# Patient Record
Sex: Male | Born: 1970
Health system: Southern US, Community
[De-identification: ages and names within clinical notes are randomized; demographics above are authoritative.]

## PROBLEM LIST (undated history)

## (undated) DIAGNOSIS — F1910 Other psychoactive substance abuse, uncomplicated: Secondary | ICD-10-CM

## (undated) DIAGNOSIS — J9600 Acute respiratory failure, unspecified whether with hypoxia or hypercapnia: Secondary | ICD-10-CM

## (undated) DIAGNOSIS — I10 Essential (primary) hypertension: Secondary | ICD-10-CM

## (undated) DIAGNOSIS — J9601 Acute respiratory failure with hypoxia: Secondary | ICD-10-CM

## (undated) DIAGNOSIS — Z87898 Personal history of other specified conditions: Secondary | ICD-10-CM

## (undated) DIAGNOSIS — K7460 Unspecified cirrhosis of liver: Secondary | ICD-10-CM

## (undated) DIAGNOSIS — S022XXA Fracture of nasal bones, initial encounter for closed fracture: Secondary | ICD-10-CM

## (undated) DIAGNOSIS — I959 Hypotension, unspecified: Secondary | ICD-10-CM

## (undated) DIAGNOSIS — S0591XA Unspecified injury of right eye and orbit, initial encounter: Secondary | ICD-10-CM

## (undated) DIAGNOSIS — M542 Cervicalgia: Secondary | ICD-10-CM

## (undated) DIAGNOSIS — I2699 Other pulmonary embolism without acute cor pulmonale: Secondary | ICD-10-CM

## (undated) DIAGNOSIS — I2692 Saddle embolus of pulmonary artery without acute cor pulmonale: Secondary | ICD-10-CM

## (undated) DIAGNOSIS — R042 Hemoptysis: Secondary | ICD-10-CM

## (undated) DIAGNOSIS — G8929 Other chronic pain: Secondary | ICD-10-CM

## (undated) DIAGNOSIS — Z87828 Personal history of other (healed) physical injury and trauma: Secondary | ICD-10-CM

## (undated) DIAGNOSIS — Z8679 Personal history of other diseases of the circulatory system: Secondary | ICD-10-CM

## (undated) DIAGNOSIS — H269 Unspecified cataract: Secondary | ICD-10-CM

## (undated) DIAGNOSIS — M5030 Other cervical disc degeneration, unspecified cervical region: Secondary | ICD-10-CM

## (undated) DIAGNOSIS — I2602 Saddle embolus of pulmonary artery with acute cor pulmonale: Secondary | ICD-10-CM

## (undated) DIAGNOSIS — R55 Syncope and collapse: Secondary | ICD-10-CM

## (undated) DIAGNOSIS — N179 Acute kidney failure, unspecified: Secondary | ICD-10-CM

## (undated) DIAGNOSIS — R000 Tachycardia, unspecified: Secondary | ICD-10-CM

## (undated) HISTORY — DX: Saddle embolus of pulmonary artery with acute cor pulmonale: I26.02

## (undated) HISTORY — DX: Syncope and collapse: R55

## (undated) HISTORY — PX: OTHER SURGICAL HISTORY: SHX169

## (undated) HISTORY — PX: TONSILLECTOMY: SUR1361

## (undated) HISTORY — DX: Unspecified cataract: H26.9

## (undated) HISTORY — DX: Saddle embolus of pulmonary artery without acute cor pulmonale: I26.92

## (undated) HISTORY — DX: Other cervical disc degeneration, unspecified cervical region: M50.30

## (undated) HISTORY — DX: Tachycardia, unspecified: R00.0

## (undated) HISTORY — DX: Personal history of other specified conditions: Z87.898

## (undated) HISTORY — PX: UPPER GASTROINTESTINAL ENDOSCOPY: SHX188

## (undated) HISTORY — DX: Other pulmonary embolism without acute cor pulmonale: I26.99

## (undated) HISTORY — DX: Personal history of other (healed) physical injury and trauma: Z87.828

## (undated) HISTORY — DX: Acute respiratory failure with hypoxia: J96.01

## (undated) HISTORY — DX: Hypotension, unspecified: I95.9

## (undated) HISTORY — DX: Other psychoactive substance abuse, uncomplicated: F19.10

## (undated) HISTORY — DX: Acute kidney failure, unspecified: N17.9

## (undated) HISTORY — DX: Fracture of nasal bones, initial encounter for closed fracture: S02.2XXA

---

## 2006-10-31 DIAGNOSIS — Z9889 Other specified postprocedural states: Secondary | ICD-10-CM

## 2007-05-13 ENCOUNTER — Ambulatory Visit: Payer: Self-pay | Admitting: Pain Medicine

## 2007-08-28 ENCOUNTER — Emergency Department: Payer: Self-pay | Admitting: Emergency Medicine

## 2007-09-05 ENCOUNTER — Emergency Department (HOSPITAL_COMMUNITY): Admission: EM | Admit: 2007-09-05 | Discharge: 2007-09-05 | Payer: Self-pay | Admitting: Emergency Medicine

## 2007-12-15 ENCOUNTER — Emergency Department: Payer: Self-pay | Admitting: Internal Medicine

## 2007-12-20 ENCOUNTER — Emergency Department (HOSPITAL_COMMUNITY): Admission: EM | Admit: 2007-12-20 | Discharge: 2007-12-20 | Payer: Self-pay | Admitting: Emergency Medicine

## 2007-12-24 ENCOUNTER — Emergency Department: Payer: Self-pay | Admitting: Emergency Medicine

## 2007-12-29 ENCOUNTER — Ambulatory Visit: Payer: Self-pay | Admitting: Nurse Practitioner

## 2007-12-29 DIAGNOSIS — H3530 Unspecified macular degeneration: Secondary | ICD-10-CM | POA: Insufficient documentation

## 2007-12-29 LAB — CONVERTED CEMR LAB
ALT: 33 units/L (ref 0–53)
AST: 17 units/L (ref 0–37)
Alkaline Phosphatase: 71 units/L (ref 39–117)
Basophils Absolute: 0 10*3/uL (ref 0.0–0.1)
Basophils Relative: 0 % (ref 0–1)
Calcium: 9.5 mg/dL (ref 8.4–10.5)
Chloride: 105 meq/L (ref 96–112)
Creatinine, Ser: 0.8 mg/dL (ref 0.40–1.50)
Lymphs Abs: 1.5 10*3/uL (ref 0.7–4.0)
MCV: 97.7 fL (ref 78.0–100.0)
Monocytes Relative: 7 % (ref 3–12)
Neutrophils Relative %: 73 % (ref 43–77)
RBC: 5.26 M/uL (ref 4.22–5.81)
Total Bilirubin: 0.6 mg/dL (ref 0.3–1.2)
WBC: 7.6 10*3/uL (ref 4.0–10.5)

## 2007-12-30 ENCOUNTER — Encounter (INDEPENDENT_AMBULATORY_CARE_PROVIDER_SITE_OTHER): Payer: Self-pay | Admitting: Nurse Practitioner

## 2008-01-01 ENCOUNTER — Encounter (INDEPENDENT_AMBULATORY_CARE_PROVIDER_SITE_OTHER): Payer: Self-pay | Admitting: Nurse Practitioner

## 2008-01-21 ENCOUNTER — Emergency Department (HOSPITAL_COMMUNITY): Admission: EM | Admit: 2008-01-21 | Discharge: 2008-01-22 | Payer: Self-pay | Admitting: Emergency Medicine

## 2008-01-21 ENCOUNTER — Emergency Department: Payer: Self-pay | Admitting: Emergency Medicine

## 2008-01-21 ENCOUNTER — Emergency Department (HOSPITAL_COMMUNITY): Admission: EM | Admit: 2008-01-21 | Discharge: 2008-01-21 | Payer: Self-pay | Admitting: Emergency Medicine

## 2008-02-16 ENCOUNTER — Telehealth (INDEPENDENT_AMBULATORY_CARE_PROVIDER_SITE_OTHER): Payer: Self-pay | Admitting: Nurse Practitioner

## 2008-02-23 ENCOUNTER — Telehealth (INDEPENDENT_AMBULATORY_CARE_PROVIDER_SITE_OTHER): Payer: Self-pay | Admitting: Nurse Practitioner

## 2008-03-02 ENCOUNTER — Telehealth (INDEPENDENT_AMBULATORY_CARE_PROVIDER_SITE_OTHER): Payer: Self-pay | Admitting: Nurse Practitioner

## 2008-03-10 ENCOUNTER — Ambulatory Visit: Payer: Self-pay | Admitting: Nurse Practitioner

## 2008-03-31 ENCOUNTER — Telehealth (INDEPENDENT_AMBULATORY_CARE_PROVIDER_SITE_OTHER): Payer: Self-pay | Admitting: Nurse Practitioner

## 2008-04-07 ENCOUNTER — Telehealth (INDEPENDENT_AMBULATORY_CARE_PROVIDER_SITE_OTHER): Payer: Self-pay | Admitting: Nurse Practitioner

## 2008-04-13 ENCOUNTER — Encounter (INDEPENDENT_AMBULATORY_CARE_PROVIDER_SITE_OTHER): Payer: Self-pay | Admitting: Nurse Practitioner

## 2008-04-19 ENCOUNTER — Encounter (INDEPENDENT_AMBULATORY_CARE_PROVIDER_SITE_OTHER): Payer: Self-pay | Admitting: Nurse Practitioner

## 2008-04-22 ENCOUNTER — Encounter (INDEPENDENT_AMBULATORY_CARE_PROVIDER_SITE_OTHER): Payer: Self-pay | Admitting: Nurse Practitioner

## 2008-05-06 ENCOUNTER — Telehealth (INDEPENDENT_AMBULATORY_CARE_PROVIDER_SITE_OTHER): Payer: Self-pay | Admitting: Nurse Practitioner

## 2008-05-20 ENCOUNTER — Emergency Department (HOSPITAL_COMMUNITY): Admission: EM | Admit: 2008-05-20 | Discharge: 2008-05-21 | Payer: Self-pay | Admitting: Emergency Medicine

## 2008-06-01 ENCOUNTER — Encounter: Admission: RE | Admit: 2008-06-01 | Discharge: 2008-06-01 | Payer: Self-pay | Admitting: Neurology

## 2008-06-01 ENCOUNTER — Telehealth (INDEPENDENT_AMBULATORY_CARE_PROVIDER_SITE_OTHER): Payer: Self-pay | Admitting: Nurse Practitioner

## 2008-06-29 ENCOUNTER — Encounter (INDEPENDENT_AMBULATORY_CARE_PROVIDER_SITE_OTHER): Payer: Self-pay | Admitting: Nurse Practitioner

## 2008-07-09 ENCOUNTER — Telehealth (INDEPENDENT_AMBULATORY_CARE_PROVIDER_SITE_OTHER): Payer: Self-pay | Admitting: Nurse Practitioner

## 2008-07-30 ENCOUNTER — Emergency Department (HOSPITAL_COMMUNITY): Admission: EM | Admit: 2008-07-30 | Discharge: 2008-07-30 | Payer: Self-pay | Admitting: Emergency Medicine

## 2008-08-04 ENCOUNTER — Other Ambulatory Visit: Payer: Self-pay | Admitting: Emergency Medicine

## 2008-08-04 ENCOUNTER — Ambulatory Visit: Payer: Self-pay | Admitting: Psychiatry

## 2008-08-04 ENCOUNTER — Inpatient Hospital Stay (HOSPITAL_COMMUNITY): Admission: AD | Admit: 2008-08-04 | Discharge: 2008-08-05 | Payer: Self-pay | Admitting: Psychiatry

## 2008-08-05 ENCOUNTER — Telehealth (INDEPENDENT_AMBULATORY_CARE_PROVIDER_SITE_OTHER): Payer: Self-pay | Admitting: Nurse Practitioner

## 2009-02-01 ENCOUNTER — Emergency Department (HOSPITAL_COMMUNITY): Admission: EM | Admit: 2009-02-01 | Discharge: 2009-02-01 | Payer: Self-pay | Admitting: Emergency Medicine

## 2009-02-11 ENCOUNTER — Emergency Department: Payer: Self-pay | Admitting: Emergency Medicine

## 2009-02-24 ENCOUNTER — Ambulatory Visit: Payer: Self-pay | Admitting: Nurse Practitioner

## 2009-03-17 ENCOUNTER — Telehealth (INDEPENDENT_AMBULATORY_CARE_PROVIDER_SITE_OTHER): Payer: Self-pay | Admitting: Nurse Practitioner

## 2009-03-17 ENCOUNTER — Encounter (INDEPENDENT_AMBULATORY_CARE_PROVIDER_SITE_OTHER): Payer: Self-pay | Admitting: Nurse Practitioner

## 2009-03-22 ENCOUNTER — Encounter (INDEPENDENT_AMBULATORY_CARE_PROVIDER_SITE_OTHER): Payer: Self-pay | Admitting: Nurse Practitioner

## 2009-04-08 ENCOUNTER — Encounter (INDEPENDENT_AMBULATORY_CARE_PROVIDER_SITE_OTHER): Payer: Self-pay | Admitting: Nurse Practitioner

## 2009-04-14 ENCOUNTER — Encounter (INDEPENDENT_AMBULATORY_CARE_PROVIDER_SITE_OTHER): Payer: Self-pay | Admitting: Nurse Practitioner

## 2009-04-20 ENCOUNTER — Emergency Department: Payer: Self-pay | Admitting: Emergency Medicine

## 2009-04-25 ENCOUNTER — Telehealth (INDEPENDENT_AMBULATORY_CARE_PROVIDER_SITE_OTHER): Payer: Self-pay | Admitting: Nurse Practitioner

## 2009-04-25 ENCOUNTER — Telehealth (INDEPENDENT_AMBULATORY_CARE_PROVIDER_SITE_OTHER): Payer: Self-pay | Admitting: *Deleted

## 2009-04-27 ENCOUNTER — Encounter (INDEPENDENT_AMBULATORY_CARE_PROVIDER_SITE_OTHER): Payer: Self-pay | Admitting: Nurse Practitioner

## 2009-04-29 ENCOUNTER — Encounter (INDEPENDENT_AMBULATORY_CARE_PROVIDER_SITE_OTHER): Payer: Self-pay | Admitting: Nurse Practitioner

## 2009-05-18 ENCOUNTER — Emergency Department: Payer: Self-pay | Admitting: Unknown Physician Specialty

## 2009-05-20 ENCOUNTER — Telehealth (INDEPENDENT_AMBULATORY_CARE_PROVIDER_SITE_OTHER): Payer: Self-pay | Admitting: Nurse Practitioner

## 2009-05-31 ENCOUNTER — Telehealth: Payer: Self-pay | Admitting: Physician Assistant

## 2009-06-20 ENCOUNTER — Telehealth (INDEPENDENT_AMBULATORY_CARE_PROVIDER_SITE_OTHER): Payer: Self-pay | Admitting: Nurse Practitioner

## 2009-07-24 ENCOUNTER — Emergency Department (HOSPITAL_COMMUNITY): Admission: EM | Admit: 2009-07-24 | Discharge: 2009-07-24 | Payer: Self-pay | Admitting: Emergency Medicine

## 2009-08-01 ENCOUNTER — Encounter (INDEPENDENT_AMBULATORY_CARE_PROVIDER_SITE_OTHER): Payer: Self-pay | Admitting: Nurse Practitioner

## 2009-09-23 ENCOUNTER — Emergency Department: Payer: Self-pay | Admitting: Emergency Medicine

## 2009-12-03 IMAGING — CR CERVICAL SPINE - 2-3 VIEW
1 series · 4 of 4 positions shown · non-contrast
Comparison: none

REASON FOR EXAM: pain
COMMENTS:

[Series 1: view not recorded · 0.17mm/px · 4 of 4 slices shown]
[im 1/4]
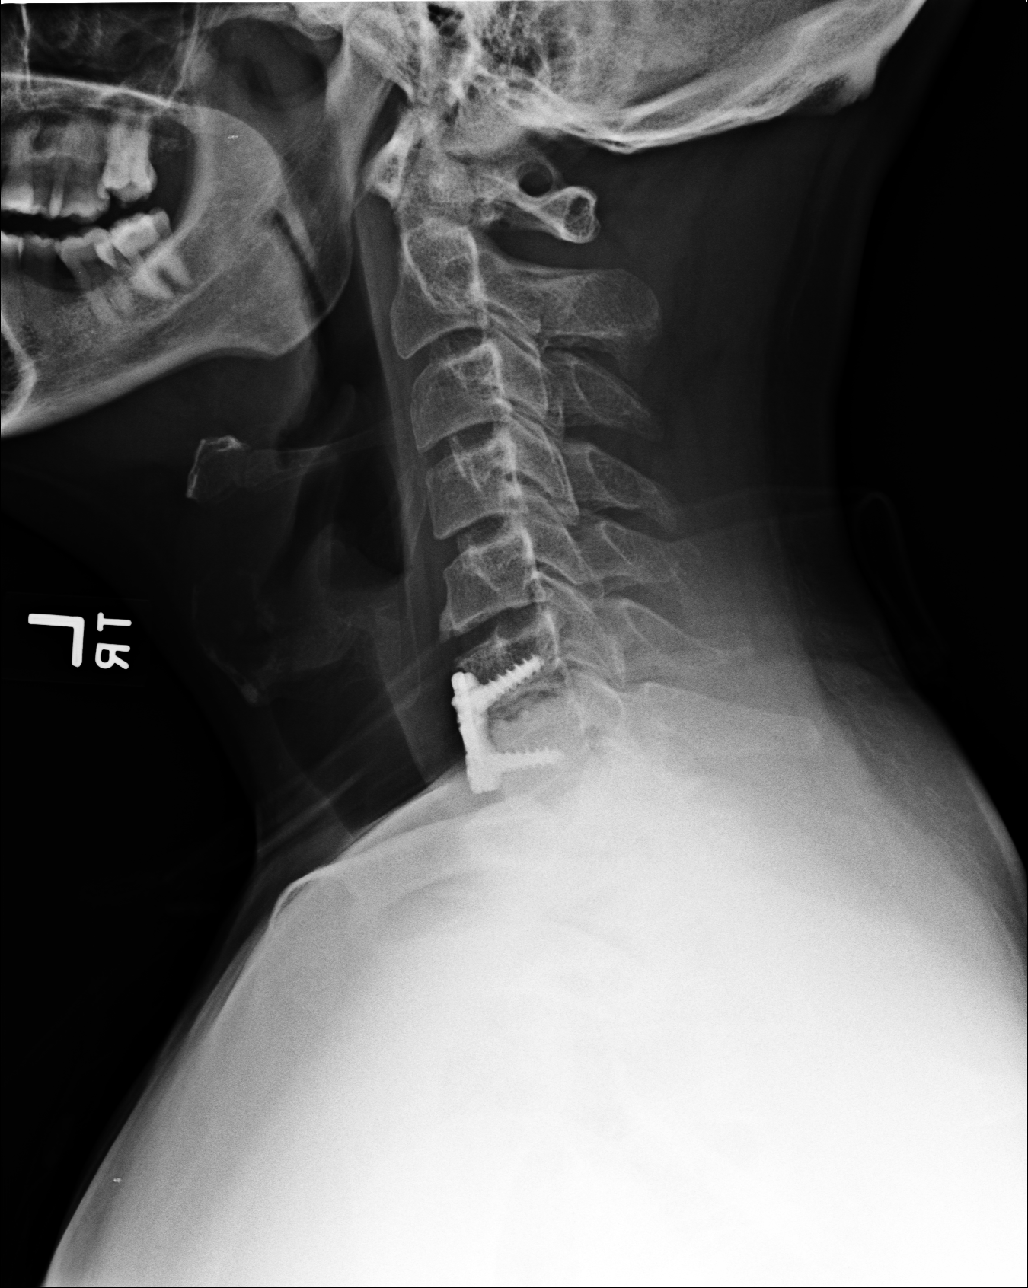
[im 2/4]
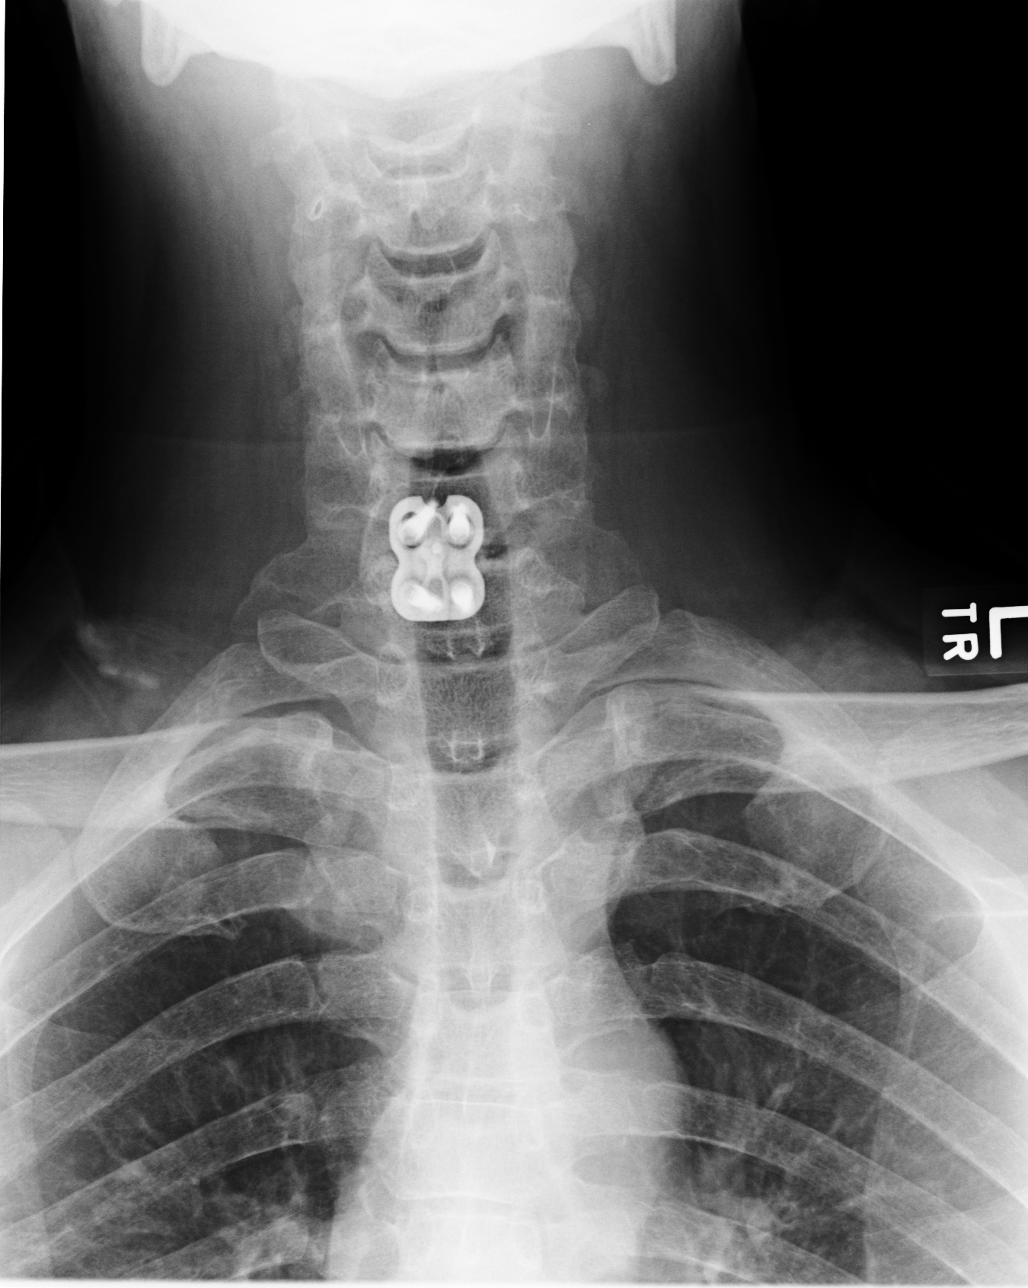
[im 3/4]
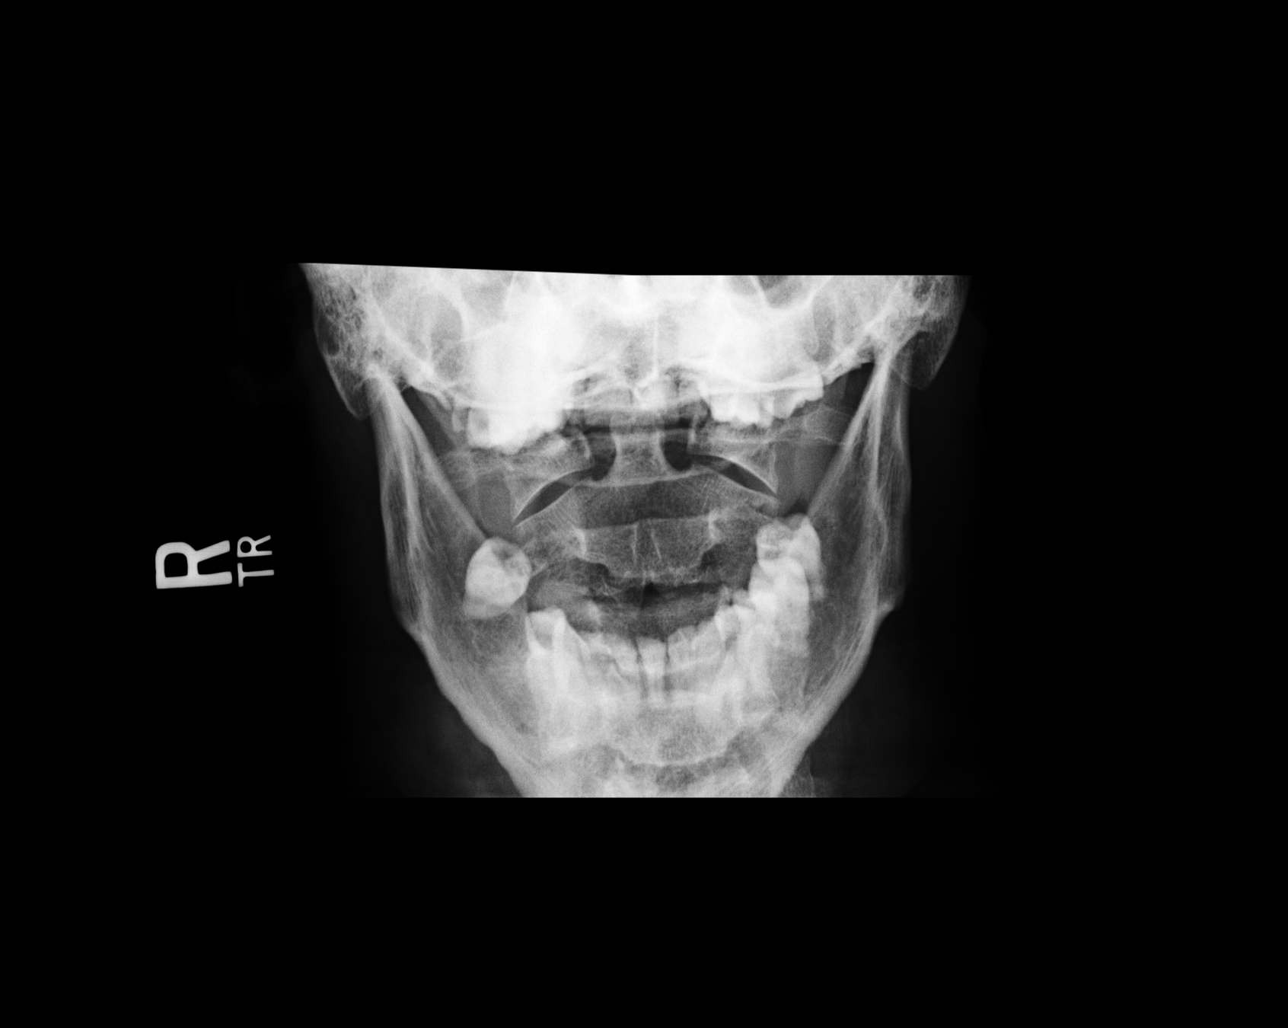
[im 4/4]
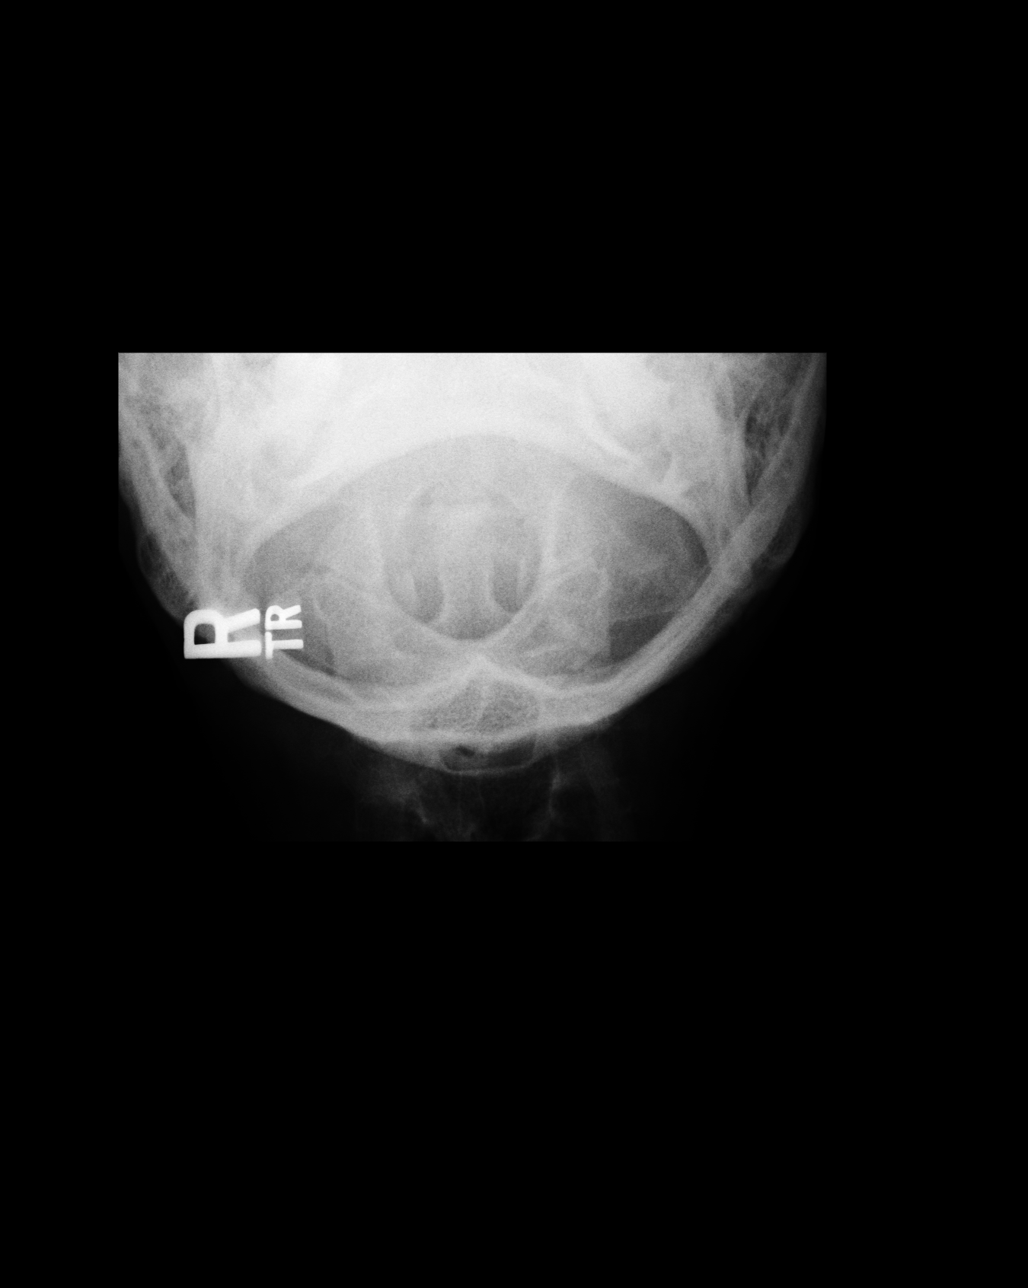

[4 of 4 positions shown; findings below may reference images not displayed]

PROCEDURE:     DXR - DXR C- SPINE AP AND LATERAL  - December 15, 2007  [DATE]

RESULT:     The patient has had prior anterior fusion at C6-C7 where
anterior metallic plate and plate screws are noted. The vertebral body
heights are well maintained. The intervertebral disc spaces above the C6-C7
level are well maintained. The odontoid process is intact. There are noted
prominent transverse processes or possibly short cervical ribs at the C7
level, more prominent on the RIGHT.
IMPRESSION: 1. Postsurgical changes are noted at C6-C7 level.
2. No fracture or other acute bony abnormality is identified.
3. There are prominent transverse processes or small cervical ribs at the C7
level, more prominent on the RIGHT.

## 2010-07-23 ENCOUNTER — Encounter: Payer: Self-pay | Admitting: Neurology

## 2010-08-01 NOTE — Miscellaneous (Signed)
Summary: Hx - [facility]  Clinical Lists Changes

## 2010-08-01 NOTE — Letter (Signed)
Summary: REFERRAL/PAIN CLINIC//[facility]  REFERRAL/PAIN CLINIC//[facility]   Imported By: Arta Bruce 07/26/2009 14:27:12  _____________________________________________________________________  External Attachment:    Type:   Image     Comment:   External Document

## 2010-08-01 NOTE — Letter (Signed)
Summary: RECEIVED RECORDS FROM [facility]  RECEIVED RECORDS FROM [facility]   Imported By: Arta Bruce 09/08/2009 12:36:52  _____________________________________________________________________  External Attachment:    Type:   Image     Comment:   External Document

## 2010-08-01 NOTE — Letter (Signed)
Summary: RECEIVED RECORDS FROM [facility]//HISTORICAL CHART  RECEIVED RECORDS FROM [facility]//HISTORICAL CHART   Imported By: Arta Bruce 03/23/2010 10:01:29  _____________________________________________________________________  External Attachment:    Type:   Image     Comment:   External Document

## 2010-10-07 LAB — URINALYSIS, ROUTINE W REFLEX MICROSCOPIC
Bilirubin Urine: NEGATIVE
Ketones, ur: 15 mg/dL — AB
Protein, ur: 100 mg/dL — AB
Urobilinogen, UA: 1 mg/dL (ref 0.0–1.0)
pH: 8.5 — ABNORMAL HIGH (ref 5.0–8.0)

## 2010-10-07 LAB — URINE MICROSCOPIC-ADD ON

## 2010-10-17 LAB — POCT I-STAT, CHEM 8
Calcium, Ion: 1.14 mmol/L (ref 1.12–1.32)
Creatinine, Ser: 1.1 mg/dL (ref 0.4–1.5)
Glucose, Bld: 109 mg/dL — ABNORMAL HIGH (ref 70–99)
Hemoglobin: 17.7 g/dL — ABNORMAL HIGH (ref 13.0–17.0)
Sodium: 146 mEq/L — ABNORMAL HIGH (ref 135–145)
TCO2: 28 mmol/L (ref 0–100)

## 2010-10-17 LAB — HEPATIC FUNCTION PANEL
ALT: 18 U/L (ref 0–53)
AST: 22 U/L (ref 0–37)
Albumin: 4.1 g/dL (ref 3.5–5.2)
Alkaline Phosphatase: 65 U/L (ref 39–117)
Indirect Bilirubin: 0.5 mg/dL (ref 0.3–0.9)
Total Protein: 7.1 g/dL (ref 6.0–8.3)

## 2010-10-17 LAB — RAPID URINE DRUG SCREEN, HOSP PERFORMED
Amphetamines: NOT DETECTED
Barbiturates: NOT DETECTED
Benzodiazepines: POSITIVE — AB
Cocaine: NOT DETECTED
Opiates: POSITIVE — AB
Tetrahydrocannabinol: NOT DETECTED

## 2010-10-17 LAB — ETHANOL: Alcohol, Ethyl (B): 205 mg/dL — ABNORMAL HIGH (ref 0–10)

## 2010-11-16 ENCOUNTER — Emergency Department: Payer: Self-pay | Admitting: Emergency Medicine

## 2010-11-23 ENCOUNTER — Emergency Department (HOSPITAL_COMMUNITY): Payer: Medicare Other

## 2010-11-23 ENCOUNTER — Emergency Department (HOSPITAL_COMMUNITY)
Admission: EM | Admit: 2010-11-23 | Discharge: 2010-11-23 | Disposition: A | Discharge status: Home / Self Care | Payer: Medicare Other | Attending: Emergency Medicine | Admitting: Emergency Medicine

## 2010-11-23 DIAGNOSIS — S139XXA Sprain of joints and ligaments of unspecified parts of neck, initial encounter: Secondary | ICD-10-CM | POA: Insufficient documentation

## 2010-11-23 DIAGNOSIS — R29898 Other symptoms and signs involving the musculoskeletal system: Secondary | ICD-10-CM | POA: Insufficient documentation

## 2010-11-23 DIAGNOSIS — G8929 Other chronic pain: Secondary | ICD-10-CM | POA: Insufficient documentation

## 2010-11-23 DIAGNOSIS — M542 Cervicalgia: Secondary | ICD-10-CM | POA: Insufficient documentation

## 2010-11-23 DIAGNOSIS — M79609 Pain in unspecified limb: Secondary | ICD-10-CM | POA: Insufficient documentation

## 2010-12-15 ENCOUNTER — Emergency Department (HOSPITAL_COMMUNITY)
Admission: EM | Admit: 2010-12-15 | Discharge: 2010-12-15 | Disposition: A | Discharge status: Home / Self Care | Payer: Medicare Other | Attending: Emergency Medicine | Admitting: Emergency Medicine

## 2010-12-15 DIAGNOSIS — M542 Cervicalgia: Secondary | ICD-10-CM | POA: Insufficient documentation

## 2010-12-15 DIAGNOSIS — M546 Pain in thoracic spine: Secondary | ICD-10-CM | POA: Insufficient documentation

## 2010-12-25 ENCOUNTER — Emergency Department: Payer: Self-pay | Admitting: Emergency Medicine

## 2010-12-29 ENCOUNTER — Emergency Department: Payer: Self-pay | Admitting: Emergency Medicine

## 2011-03-30 LAB — DIFFERENTIAL
Basophils Absolute: 0
Eosinophils Relative: 0
Lymphocytes Relative: 7 — ABNORMAL LOW
Neutro Abs: 9.4 — ABNORMAL HIGH
Neutrophils Relative %: 84 — ABNORMAL HIGH

## 2011-03-30 LAB — POCT I-STAT, CHEM 8
BUN: 10
Potassium: 4.2
Sodium: 138
TCO2: 30

## 2011-03-30 LAB — CBC
HCT: 42.5
Platelets: 230
RDW: 14.2
WBC: 11.3 — ABNORMAL HIGH

## 2011-05-02 ENCOUNTER — Emergency Department: Payer: Self-pay | Admitting: Emergency Medicine

## 2012-07-09 ENCOUNTER — Emergency Department (HOSPITAL_COMMUNITY): Payer: Medicare Other

## 2012-07-09 ENCOUNTER — Encounter (HOSPITAL_COMMUNITY): Payer: Self-pay | Admitting: *Deleted

## 2012-07-09 ENCOUNTER — Emergency Department (HOSPITAL_COMMUNITY)
Admission: EM | Admit: 2012-07-09 | Discharge: 2012-07-09 | Disposition: A | Discharge status: Home / Self Care | Payer: Medicare Other | Attending: Emergency Medicine | Admitting: Emergency Medicine

## 2012-07-09 DIAGNOSIS — Y92009 Unspecified place in unspecified non-institutional (private) residence as the place of occurrence of the external cause: Secondary | ICD-10-CM | POA: Insufficient documentation

## 2012-07-09 DIAGNOSIS — M542 Cervicalgia: Secondary | ICD-10-CM | POA: Insufficient documentation

## 2012-07-09 DIAGNOSIS — M25529 Pain in unspecified elbow: Secondary | ICD-10-CM

## 2012-07-09 DIAGNOSIS — Z79899 Other long term (current) drug therapy: Secondary | ICD-10-CM | POA: Insufficient documentation

## 2012-07-09 DIAGNOSIS — R296 Repeated falls: Secondary | ICD-10-CM | POA: Insufficient documentation

## 2012-07-09 DIAGNOSIS — S59909A Unspecified injury of unspecified elbow, initial encounter: Secondary | ICD-10-CM | POA: Insufficient documentation

## 2012-07-09 DIAGNOSIS — Y9389 Activity, other specified: Secondary | ICD-10-CM | POA: Insufficient documentation

## 2012-07-09 DIAGNOSIS — G8929 Other chronic pain: Secondary | ICD-10-CM | POA: Insufficient documentation

## 2012-07-09 DIAGNOSIS — S6990XA Unspecified injury of unspecified wrist, hand and finger(s), initial encounter: Secondary | ICD-10-CM | POA: Insufficient documentation

## 2012-07-09 DIAGNOSIS — S59919A Unspecified injury of unspecified forearm, initial encounter: Secondary | ICD-10-CM | POA: Insufficient documentation

## 2012-07-09 DIAGNOSIS — Z9889 Other specified postprocedural states: Secondary | ICD-10-CM | POA: Insufficient documentation

## 2012-07-09 HISTORY — DX: Cervicalgia: M54.2

## 2012-07-09 HISTORY — DX: Other chronic pain: G89.29

## 2012-07-09 NOTE — ED Notes (Signed)
Pt with hx neck surgery to ED c/o falling on R elbow 1.5 weeks ago.  Pain has been increasing so pt came in.  Pt with fom. 

## 2012-07-09 NOTE — ED Provider Notes (Signed)
Medical screening examination/treatment/procedure(s) were performed by non-physician practitioner and as supervising physician I was immediately available for consultation/collaboration.    Charles B. Sheldon, MD 07/09/12 1959 

## 2012-07-09 NOTE — ED Notes (Signed)
Pt with hx neck surgery to ED c/o falling on R elbow 1.5 weeks ago.  Pain has been increasing so pt came in.  Pt with fom.

## 2012-07-09 NOTE — ED Notes (Signed)
Was doing jujitsu moves with a friend, "next thing I know I was layed out on the ground."

## 2012-07-09 NOTE — ED Provider Notes (Signed)
History     CSN: [id number]  Arrival date & time 07/09/12  1253   First MD Initiated Contact with Patient 07/09/12 1333      Chief Complaint  Patient presents with  . Arm Injury    (Consider location/radiation/quality/duration/timing/severity/associated sxs/prior treatment) Patient is a 42 y.o. male presenting with arm injury. The history is provided by the patient.  Arm Injury  Episode onset: 1.5 weeks ago  The incident occurred at another residence. The injury mechanism was a fall. The context of the injury is unknown. The wounds were not self-inflicted. There is an injury to the right elbow. The pain is moderate. Pertinent negatives include no numbness, no abdominal pain, no bladder incontinence, no headaches, no inability to bear weight, no neck pain, no pain when bearing weight, no focal weakness, no light-headedness, no loss of consciousness, no tingling, no weakness, no cough and no difficulty breathing. There have been no prior injuries to these areas. He is right-handed. His tetanus status is UTD. There were no sick contacts.    Past Medical History  Diagnosis Date  . Chronic neck pain     Past Surgical History  Procedure Date  . Neck fusion     c5-7    No family history on file.  History  Substance Use Topics  . Smoking status: Never Smoker   . Smokeless tobacco: Not on file  . Alcohol Use: No      Review of Systems  Constitutional: Negative for fever, diaphoresis and activity change.  HENT: Negative for congestion and neck pain.   Respiratory: Negative for cough.   Gastrointestinal: Negative for abdominal pain.  Genitourinary: Negative for bladder incontinence and dysuria.  Musculoskeletal: Negative for myalgias.  Skin: Negative for color change and wound.  Neurological: Negative for tingling, focal weakness, loss of consciousness, weakness, light-headedness, numbness and headaches.  All other systems reviewed and are negative.    Allergies  Tramadol  and Other  Home Medications   Current Outpatient Rx  Name  Route  Sig  Dispense  Refill  . GABAPENTIN 100 MG PO CAPS   Oral   Take 100 mg by mouth 4 (four) times daily.         Marland Kitchen HYDROCODONE-ACETAMINOPHEN 7.5-325 MG PO TABS   Oral   Take 1 tablet by mouth 3 (three) times daily.           BP 133/83  Pulse 87  Temp 97.9 F (36.6 C) (Oral)  Resp 18  SpO2 98%  Physical Exam  Nursing note and vitals reviewed. Constitutional: He appears well-developed and well-nourished. No distress.  HENT:  Head: Normocephalic and atraumatic.  Eyes: Conjunctivae normal and EOM are normal.  Neck: Normal range of motion. Neck supple.       No spinous process tenderness  Cardiovascular:       Intact distal pulses, capillary refill < 3 seconds  Musculoskeletal:       ttp over lateral epicondyle. Pain with supination and pronation. All other extremities with normal ROM  Neurological:       No sensory deficit  Skin: He is not diaphoretic.       Skin intact, no tenting    ED Course  Procedures (including critical care time)  Labs Reviewed - No data to display Dg Elbow Complete Right  07/09/2012  *RADIOLOGY REPORT*  Clinical Data: Injury  RIGHT ELBOW - COMPLETE 3+ VIEW  Comparison: None.  Findings: Four views of the right elbow submitted.  No acute fracture  or subluxation.  No radiopaque foreign body.  No posterior fat pad sign.  IMPRESSION: No acute fracture or subluxation.   Original Report Authenticated By: Natasha Mead, M.D.      No diagnosis found.    MDM  Jared Bass is a 42 y.o. male being followed for chronic neck pain comes in today for worsening elbow pain after a fall that occurred 1 1/2 weeks ago. Patient X-Ray negative for obvious fracture or dislocation.  Pt advised to follow up with orthopedics if symptoms persist for possibility of missed fracture diagnosis. Conservative therapy recommended and discussed. Patient will be dc home & is agreeable with above  plan.         Jaci Carrel, [place] 07/09/12 1439

## 2012-10-10 ENCOUNTER — Encounter (HOSPITAL_COMMUNITY): Payer: Self-pay

## 2012-10-10 ENCOUNTER — Emergency Department (HOSPITAL_COMMUNITY): Payer: Medicare Other

## 2012-10-10 ENCOUNTER — Emergency Department (HOSPITAL_COMMUNITY)
Admission: EM | Admit: 2012-10-10 | Discharge: 2012-10-10 | Disposition: A | Discharge status: Home / Self Care | Payer: Medicare Other | Attending: Emergency Medicine | Admitting: Emergency Medicine

## 2012-10-10 DIAGNOSIS — R209 Unspecified disturbances of skin sensation: Secondary | ICD-10-CM | POA: Insufficient documentation

## 2012-10-10 DIAGNOSIS — S46909A Unspecified injury of unspecified muscle, fascia and tendon at shoulder and upper arm level, unspecified arm, initial encounter: Secondary | ICD-10-CM | POA: Insufficient documentation

## 2012-10-10 DIAGNOSIS — M5412 Radiculopathy, cervical region: Secondary | ICD-10-CM | POA: Insufficient documentation

## 2012-10-10 DIAGNOSIS — R000 Tachycardia, unspecified: Secondary | ICD-10-CM | POA: Insufficient documentation

## 2012-10-10 DIAGNOSIS — S0993XA Unspecified injury of face, initial encounter: Secondary | ICD-10-CM | POA: Insufficient documentation

## 2012-10-10 DIAGNOSIS — Y9289 Other specified places as the place of occurrence of the external cause: Secondary | ICD-10-CM | POA: Insufficient documentation

## 2012-10-10 DIAGNOSIS — Y9389 Activity, other specified: Secondary | ICD-10-CM | POA: Insufficient documentation

## 2012-10-10 DIAGNOSIS — S199XXA Unspecified injury of neck, initial encounter: Secondary | ICD-10-CM | POA: Insufficient documentation

## 2012-10-10 DIAGNOSIS — S4980XA Other specified injuries of shoulder and upper arm, unspecified arm, initial encounter: Secondary | ICD-10-CM | POA: Insufficient documentation

## 2012-10-10 DIAGNOSIS — Z79899 Other long term (current) drug therapy: Secondary | ICD-10-CM | POA: Insufficient documentation

## 2012-10-10 DIAGNOSIS — Z981 Arthrodesis status: Secondary | ICD-10-CM | POA: Insufficient documentation

## 2012-10-10 DIAGNOSIS — S0990XA Unspecified injury of head, initial encounter: Secondary | ICD-10-CM | POA: Insufficient documentation

## 2012-10-10 DIAGNOSIS — G8929 Other chronic pain: Secondary | ICD-10-CM | POA: Insufficient documentation

## 2012-10-10 MED ORDER — METHOCARBAMOL 500 MG PO TABS: 500.0000 mg | ORAL_TABLET | Freq: Two times a day (BID) | ORAL | Status: DC

## 2012-10-10 MED ORDER — HYDROCODONE-ACETAMINOPHEN 5-325 MG PO TABS
2.0000 | ORAL_TABLET | Freq: Once | ORAL | Status: AC
  Administered 2012-10-10: 2 via ORAL
  Filled 2012-10-10: qty 2

## 2012-10-10 MED ORDER — HYDROCODONE-ACETAMINOPHEN 5-325 MG PO TABS: 2.0000 | ORAL_TABLET | Freq: Four times a day (QID) | ORAL | Status: DC | PRN

## 2012-10-10 MED ORDER — IBUPROFEN 600 MG PO TABS: 600.0000 mg | ORAL_TABLET | Freq: Four times a day (QID) | ORAL | Status: DC | PRN

## 2012-10-10 NOTE — ED Notes (Signed)
Pt states he was thrown from a horse, approx 6 foot fall, into a metal fence.C/O right arm "burning". Hx of ACDF x 2. Last in 2001 in [place], [place].

## 2012-10-10 NOTE — ED Provider Notes (Signed)
History    This chart was scribed for non-physician practitioner working with Jared Chick, MD by Toya Smothers, ED Scribe. This patient was seen in room TR11C/TR11C and the patient's care was started at 3:24 PM.   CSN: [id number]  Arrival date & time 10/10/12  1345   First MD Initiated Contact with Patient 10/10/12 1507      Chief Complaint  Patient presents with  . Fall   Patient is a 42 y.o. male presenting with fall. The history is provided by the patient. No language interpreter was used.  Fall Associated symptoms include headaches.    Jared Bass is a 42 y.o. male with h/o chronic neck pain and neck fusion who presents to the ED c/o right shoulder and neck pain as the result of a fall today. Pain is severe, described as a burning sensation radiating down the right arm, constant, worse with movement, and alleviated by nothing. Pt reports that he was thrown from a houre while riding into a bush, landing on the right side of the thoracic back. CC has gradually worsened and Pt now c/o severe HA. No back pain, gaiting difficulty, cephalic trauma, bleeding, or LOC. Symptoms have not been treated PTA. Pt denies use of tobacco, alcohol, and illicit drug use. Allergies to tramadol.    Past Medical History  Diagnosis Date  . Chronic neck pain     Past Surgical History  Procedure Laterality Date  . Neck fusion      c5-7    History reviewed. No pertinent family history.  History  Substance Use Topics  . Smoking status: Never Smoker   . Smokeless tobacco: Not on file  . Alcohol Use: No      Review of Systems  HENT: Positive for neck pain.   Neurological: Positive for headaches.  All other systems reviewed and are negative.    Allergies  Tramadol and Other  Home Medications   Current Outpatient Rx  Name  Route  Sig  Dispense  Refill  . buPROPion (WELLBUTRIN XL) 150 MG 24 hr tablet   Oral   Take 150 mg by mouth daily.         Marland Kitchen gabapentin (NEURONTIN) 100 MG  capsule   Oral   Take 100 mg by mouth 4 (four) times daily.         Marland Kitchen HYDROcodone-acetaminophen (NORCO) 7.5-325 MG per tablet   Oral   Take 1 tablet by mouth 3 (three) times daily.           BP 121/91  Pulse 108  Temp(Src) 98.6 F (37 C) (Oral)  Resp 16  SpO2 100%  Physical Exam  Nursing note and vitals reviewed. Constitutional: He is oriented to person, place, and time. He appears well-developed and well-nourished. No distress.  HENT:  Head: Normocephalic and atraumatic.  Eyes: EOM are normal.  Neck: Normal range of motion. Neck supple. No tracheal deviation present.  Cardiovascular: Regular rhythm and normal heart sounds.  Exam reveals no gallop and no friction rub.   No murmur heard. Mildly tachy  Pulmonary/Chest: Effort normal and breath sounds normal. No respiratory distress.  Abdominal: There is no tenderness.  Musculoskeletal: Normal range of motion.  Cervical paraspinal muscles tender to palpation. Right upper trapezious tender to palpation. ROM and strength 5/5 bilateral upper extremeties. Distally neurovascularly intact.  Neurological: He is alert and oriented to person, place, and time.  Sensation intact.  Skin: Skin is warm and dry.  Psychiatric: He has a normal  mood and affect. His behavior is normal.    ED Course  Procedures DIAGNOSTIC STUDIES: Oxygen Saturation is 100% on room air, normal by my interpretation.    COORDINATION OF CARE: 14:01- Ordered DG Shoulder Right and DG Cervical Spine Complete 1 time imaging. 15:23- Evaluated Pt. Pt is awake, alert, and without distress. 15:31- Patient understands and agree with initial ED impression and plan with expectations set for ED visit.    Labs Reviewed - No data to display Dg Cervical Spine Complete  10/10/2012  **ADDENDUM** CREATED: 10/10/2012 15:12:00  Redemonstration of fracture of the right second mandibular molar without interval change.  **END ADDENDUM** SIGNED BY: Sterling Big, M.D.    10/10/2012  *RADIOLOGY REPORT*  Clinical Data: Fall from horse, right shoulder and neck pain  CERVICAL SPINE - COMPLETE 4+ VIEW  Comparison: Prior cervical spine radiographs 11/23/2010  Findings: Prior ACDF of C5-C7 with anterior plate and screw construct.  Successful bony fusion with an interbody graft at C5- C6.  No evidence of hardware complication or change in alignment compared to the prior study.  No prevertebral soft tissue swelling. The dens is well imaged on the open mouth odontoid view.  No evidence of fracture.  IMPRESSION:  1.  No acute fracture or malalignment.  2.  Stable solid cervical fusion spanning C5-C7 without evidence of complication.   Original Report Authenticated By: Malachy Moan, M.D.    Dg Shoulder Right  10/10/2012  *RADIOLOGY REPORT*  Clinical Data: Thrown from horse  RIGHT SHOULDER - 2+ VIEW  Comparison: None.  Findings: No acute fracture or malalignment.  The humeral head is located with respect to the glenoid on the scapular Y view.  No significant degenerative changes.  The visualized thorax is unremarkable.  IMPRESSION: Negative right shoulder.   Original Report Authenticated By: Malachy Moan, M.D.      1. Cervical radiculopathy       MDM  Patient with right-sided shoulder Stinger following pain bucked from a horse. He describes numbness and tingling down his right arm. No signs of fracture, or traumatic injury. Range of motion, strength, and sensation are intact. Will discharge with pain medicine, ibuprofen, and Robaxin. Followup with orthopedics or her primary care provider if symptoms worsen or persist.     I personally performed the services described in this documentation, which was scribed in my presence. The recorded information has been reviewed and is accurate.     [name], PA-C 10/10/12 1934

## 2012-10-10 NOTE — ED Provider Notes (Signed)
Medical screening examination/treatment/procedure(s) were performed by non-physician practitioner and as supervising physician I was immediately available for consultation/collaboration.  Ethelda Chick, MD 10/10/12 [phone]

## 2012-10-10 NOTE — ED Notes (Addendum)
Pt reports he was thrown from a horse yesterday approx 5 pm, pt was thrown over a fence landing in some bushes. Pt c/o Right arm pain and posterior neck pain. Pt reports he had 2 surgeries to same area d/t herniated disc. Pt MAE, denies LOC

## 2013-04-27 ENCOUNTER — Emergency Department: Payer: Self-pay | Admitting: Emergency Medicine

## 2013-08-20 ENCOUNTER — Emergency Department (HOSPITAL_COMMUNITY): Payer: Medicare Other

## 2013-08-20 ENCOUNTER — Encounter (HOSPITAL_COMMUNITY): Payer: Self-pay | Admitting: Emergency Medicine

## 2013-08-20 ENCOUNTER — Emergency Department (HOSPITAL_COMMUNITY)
Admission: EM | Admit: 2013-08-20 | Discharge: 2013-08-20 | Disposition: A | Discharge status: Home / Self Care | Payer: Medicare Other | Attending: Emergency Medicine | Admitting: Emergency Medicine

## 2013-08-20 DIAGNOSIS — Z9889 Other specified postprocedural states: Secondary | ICD-10-CM | POA: Insufficient documentation

## 2013-08-20 DIAGNOSIS — S0993XA Unspecified injury of face, initial encounter: Secondary | ICD-10-CM | POA: Insufficient documentation

## 2013-08-20 DIAGNOSIS — Y939 Activity, unspecified: Secondary | ICD-10-CM | POA: Insufficient documentation

## 2013-08-20 DIAGNOSIS — S199XXA Unspecified injury of neck, initial encounter: Secondary | ICD-10-CM

## 2013-08-20 DIAGNOSIS — M542 Cervicalgia: Secondary | ICD-10-CM

## 2013-08-20 DIAGNOSIS — G8929 Other chronic pain: Secondary | ICD-10-CM | POA: Insufficient documentation

## 2013-08-20 DIAGNOSIS — W010XXA Fall on same level from slipping, tripping and stumbling without subsequent striking against object, initial encounter: Secondary | ICD-10-CM | POA: Insufficient documentation

## 2013-08-20 DIAGNOSIS — Z79899 Other long term (current) drug therapy: Secondary | ICD-10-CM | POA: Insufficient documentation

## 2013-08-20 DIAGNOSIS — Y929 Unspecified place or not applicable: Secondary | ICD-10-CM | POA: Insufficient documentation

## 2013-08-20 MED ORDER — HYDROCODONE-ACETAMINOPHEN 5-325 MG PO TABS
2.0000 | ORAL_TABLET | Freq: Once | ORAL | Status: AC
  Administered 2013-08-20: 2 via ORAL
  Filled 2013-08-20: qty 2

## 2013-08-20 MED ORDER — HYDROCODONE-ACETAMINOPHEN 7.5-325 MG PO TABS: 2.0000 | ORAL_TABLET | Freq: Three times a day (TID) | ORAL | Status: DC

## 2013-08-20 MED ORDER — METHOCARBAMOL 500 MG PO TABS: 500.0000 mg | ORAL_TABLET | Freq: Two times a day (BID) | ORAL | Status: DC

## 2013-08-20 NOTE — Discharge Instructions (Signed)
RICE: Routine Care for Injuries °The routine care of many injuries includes Rest, Ice, Compression, and Elevation (RICE). °HOME CARE INSTRUCTIONS °· Rest is needed to allow your body to heal. Routine activities can usually be resumed when comfortable. Injured tendons and bones can take up to 6 weeks to heal. Tendons are the cord-like structures that attach muscle to bone. °· Ice following an injury helps keep the swelling down and reduces pain. °· Put ice in a plastic bag. °· Place a towel between your skin and the bag. °· Leave the ice on for 15-20 minutes, 03-04 times a day. Do this while awake, for the first 24 to 48 hours. After that, continue as directed by your caregiver. °· Compression helps keep swelling down. It also gives support and helps with discomfort. If an elastic bandage has been applied, it should be removed and reapplied every 3 to 4 hours. It should not be applied tightly, but firmly enough to keep swelling down. Watch fingers or toes for swelling, bluish discoloration, coldness, numbness, or excessive pain. If any of these problems occur, remove the bandage and reapply loosely. Contact your caregiver if these problems continue. °· Elevation helps reduce swelling and decreases pain. With extremities, such as the arms, hands, legs, and feet, the injured area should be placed near or above the level of the heart, if possible. °SEEK IMMEDIATE MEDICAL CARE IF: °· You have persistent pain and swelling. °· You develop redness, numbness, or unexpected weakness. °· Your symptoms are getting worse rather than improving after several days. °These symptoms may indicate that further evaluation or further X-rays are needed. Sometimes, X-rays may not show a small broken bone (fracture) until 1 week or 10 days later. Make a follow-up appointment with your caregiver. Ask when your X-ray results will be ready. Make sure you get your X-ray results. °Document Released: 09/30/2000 Document Revised: 09/10/2011  Document Reviewed: 11/17/2010 °ExitCare® Patient Information ©2014 [employer]. ° ° °Emergency Department Resource Guide °1) Find a Doctor and Pay Out of Pocket °Although you won't have to find out who is covered by your insurance plan, it is a good idea to ask around and get recommendations. You will then need to call the office and see if the doctor you have chosen will accept you as a new patient and what types of options they offer for patients who are self-pay. Some doctors offer discounts or will set up payment plans for their patients who do not have insurance, but you will need to ask so you aren't surprised when you get to your appointment. ° °2) Contact Your Local Health Department °Not all health departments have doctors that can see patients for sick visits, but many do, so it is worth a call to see if yours does. If you don't know where your local health department is, you can check in your phone book. The CDC also has a tool to help you locate your state's health department, and many state websites also have listings of all of their local health departments. ° °3) Find a Walk-in Clinic °If your illness is not likely to be very severe or complicated, you may want to try a walk in clinic. These are popping up all over the country in pharmacies, drugstores, and shopping centers. They're usually staffed by nurse practitioners or physician assistants that have been trained to treat common illnesses and complaints. They're usually fairly quick and inexpensive. However, if you have serious medical issues or chronic medical problems, these are probably   not your best option. ° °No Primary Care Doctor: °- Call [facility] at  832-8000 - they can help you locate a primary care doctor that  accepts your insurance, provides certain services, etc. °- Physician Referral Service- 1-800-533-3463 ° °Chronic Pain Problems: °Organization         Address  Phone   Notes  °[facility]  (336) 297-2271  Patients need to be referred by their primary care doctor.  ° °Medication Assistance: °Organization         Address  Phone   Notes  °[place] [facility] [address] °[place], [place] [address] (336) 641-8030 --Must be a resident of [place] °-- Must have NO insurance coverage whatsoever (no Medicaid/ Medicare, etc.) °-- The pt. MUST have a primary care doctor that directs their care regularly and follows them in the community °  °[facility]  (866) 331-1348   °[employer]  (888) 892-1162   ° °Agencies that provide inexpensive medical care: °Organization         Address  Phone   Notes  °[facility]  (336) 832-8035   °[facility]    (336) 832-7272   °[facility] [address] °[place], [place] [address] (336) 832-4777   °[facility] [address], °[place] (336) 271-4999   °[facility]    (336) 373-0678   °[facility]    (336) 272-1050   °[facility] ° [address], [place] Phone:  (336) 832-4444, Fax:  (336) 832-4440 Hours of Operation:  9 am - 6 pm, M-F.  Also accepts Medicaid/Medicare and self-pay.  °[facility] ° [address], [place] Phone: (336) 832-3150, Fax: (336) 832-3151. Hours of Operation:  8:30 am - 5:30 pm, M-F.  Also accepts Medicaid and self-pay.  °[facility] [place] [address], [place] Phone: (336) 878-6027   °[facility] [address], [place], [place] (336)723-1848, Ext. 123 Mondays & Thursdays: 7-9 AM.  First 15 patients are seen on a first come, first serve basis. °  ° °Medicaid-accepting [place] Providers: ° °Organization         Address  Phone   Notes  °[facility] [address], [place] (336) 641-2100 Also accepts self-pay patients.  °[facility] [address], [place] ° (336)  856-9996   °[facility] [address], [place] (336) 288-8857   °[facility] [address] [place] [address], [place] (336) 299-7000   °Veita Bland [address], [place]  ° (336) 373-1557 Only accepts [place] Access Medicaid patients after they have their name applied to their card.  ° °Self-Pay (no insurance) in [place]: ° °Organization         Address  Phone   Notes  °Sickle Cell Patients, [facility] [address], [place] (336) 832-1970   °[facility] [address], [place] (336) 832-4400   °[facility] ° [address], [place] (336) 992-4800   °[facility]/Dr. Osei-Bonsu ° [address] [place] [address], [place] or [address], [place] (336) 841-8500 Phone number for both [place] and  locations is the same.  °[facility] [address], [place] (336) 299-0000   °[facility] [address] [place] [address], [place] or [address] (336) 852-7530 °(336) 878-2260   °[facility] [address], [place] (336) 350-1642, phone; (336) 294-5005, fax Sees patients 1st and 3rd Saturday of every month.  Must not qualify for public or private insurance (i.e. Medicaid, Medicare, [place] Health Choice, Veterans' Benefits) • Household income should be no more than 200% of the poverty level •The clinic cannot treat you if you are pregnant or think you are pregnant • Sexually transmitted diseases are not treated at the clinic.  ° ° °Dental Care: °Organization         Address  Phone  Notes  °[facility] [facility] [address], [place] (336) 641-6152 Accepts children up to age 21 who are enrolled in Medicaid or [place] Health Choice; pregnant women with a Medicaid card; and children who have applied for Medicaid or [place] Health Choice, but were  declined, whose parents can pay a reduced fee at time of service.  °[facility] [place]  [address], [place] (336) 641-7733 Accepts children up to age 21 who are enrolled in Medicaid or [place] Health Choice; pregnant women with a Medicaid card; and children who have applied for Medicaid or [place] Health Choice, but were declined, whose parents can pay a reduced fee at time of service.  °[facility] ° [address], [place] (336) 641-4533 Patients are seen by appointment only. Walk-ins are not accepted. [facility] will see patients 18 years of age and older. °Monday - Tuesday (8am-5pm) °Most Wednesdays (8:30-5pm) °$30 per visit, cash only  °[facility] ° [address], [place] (336) 641-4533 Patients are seen by appointment only. Walk-ins are not accepted. [facility] will see patients 18 years of age and older. °One Wednesday Evening (Monthly: Volunteer Based).  $30 per visit, cash only  °[facility]  (919) 537-3737 for adults; Children under age 4, call Graduate Pediatric Dentistry at (919) [phone]. Children aged 4-14, please call (919) [phone] to request a pediatric application. ° Dental services are provided in all areas of dental care including fillings, crowns and bridges, complete and partial dentures, implants, gum treatment, root canals, and extractions. Preventive care is also provided. Treatment is provided to both adults and children. °Patients are selected via a lottery and there is often a waiting list. °  °[facility] [address], ° ° (336) 763-8833 [email or web address] °  °[facility] [address], [place], [place] (336)723-1848, Ext. 123 Second and Fourth Thursday of each month, opens at 6:30 AM; Clinic ends at 9 AM.  Patients are seen on a first-come first-served basis, and a limited number are seen during each clinic.   ° °[facility] ° [address], [place], [place] (336) 723-7904   Eligibility Requirements °You must have lived in [place], [place], or [place] counties for at least the last three months. °  You cannot be eligible for state or federal sponsored healthcare insurance, including Veterans Administration, Medicaid, or Medicare. °  You generally cannot be eligible for healthcare insurance through your employer.  °  How to apply: °Eligibility screenings are held every Tuesday and Wednesday afternoon from 1:00 pm until 4:00 pm. You do not need an appointment for the interview!  °[facility] [address], [place], [place] 336-631-2330   °[facility]  336-342-8273   °[facility]  336-703-3100   °[facility]  336-570-6415   ° °Behavioral Health Resources in the Community: °Intensive Outpatient Programs °Organization         Address  Phone  Notes  °[facility] [address], [place], [place] 336-878-6098   °[facility] [address], [place], [place] 336-832-9800   °[facility] [address], [place], [place] ° 336-882-2125   °[facility] [address],  °[place], [place] 1-800-853-5163 or 336-641-4981   °Substance Abuse Resources °Organization         Address  Phone  Notes  °[facility]  336-882-2125   °[facility]  336-784-9470   °[facility]  336-285-9073   °[facility]  336-845-3988   °Residential & Outpatient Substance Abuse Program  1-800-659-3381   °Psychological Services °Organization         Address  Phone  Notes  °[facility]  336- 832-9600   °[facility]  336- 378-7881   °[facility] [address], [place] 1-800-853-5163 or 336-641-4981   ° °Mobile Crisis Teams °Organization         Address  Phone  Notes  °[facility], Mobile Crisis Care  Unit  1-877-626-1772   °Assertive °[facility] ° [address] [place], [place] 336-834-9664   °Sharon DeEsch [address] °[place] 336-554-5454   ° °Self-Help/Support Groups °Organization         Address  Phone             Notes  °[facility] [place] - variety of support groups  336- 373-1402 Call for more information  °Narcotics Anonymous (NA), [facility] [address], °[place]  2 meetings at this location  ° °Residential Treatment Programs °Organization         Address  Phone  Notes  °[facility] [address],    °[place]  1-866-801-8205   °[facility] ° [address], [place], [place] 704-293-8524   °[facility] [address], [place] 336-845-3988 Admissions: 8am-3pm M-F  °[facility] [address]    °[place], [place] 336-841-1104   °[facility] [address], [place], [place] 336-379-7146   °[facility] [address]  °[place], [place] 336-285-9073   °[facility] Intensive Outpatient [address], [place], [place] 336-852-3033   °[facility] [address]  °[place], [place] 1-877-615-2722 or 336-784-9470   °[facility] [address] [place], [place] 336-227-7417 Accepts Medicaid  °[facility] [address]  °[place] 1-800-659-3381 Substance Abuse/Addiction Treatment  ° °[place] Behavioral Health Resources °Organization         Address  Phone  Notes  °[facility]  (888) 581-9988   °Julie Brannon, PhD [address] [place], [place]   (336) 349-5553 or (336) 951-0000   °[facility]   [address] °[place], [place] (336) 349-4454   °[facility] [address], [place],  (336) 342-8316 Insurance/Medicaid/sponsorship through [facility]  °[facility] [address]                                       [place], [place] (336) 342-8316 Therapy/tele-psych/case  °[facility] [address]  ° [place], [place] (336) 349-2233    °Dr. Arfeen  (336) 349-4544   °[facility] [place]  [employer] [place] [facility] 1) [address], [place] °2) [address], [place] °3)  [address], [place] (336) 349-3220 °(336) 342-7768 ° °(336) 342-8140   °[place] [facility] (336) 342-1394 or (336) 342-3537 (After Hours)    ° ° ° °

## 2013-08-20 NOTE — ED Notes (Signed)
Slipped on ice yesterday and landed on back has hx of neck surgeries and was having some burning in arms rt thumb  Numbness etc pt able to walk

## 2013-08-20 NOTE — ED Provider Notes (Signed)
CSN: [id number]     Arrival date & time 08/20/13  1332 History  This chart was scribed for Fayrene Helper, PA working with Rolland Porter, MD by Quintella Reichert, ED Scribe. This patient was seen in room TR08C/TR08C and the patient's care was started at 2:32 PM.   Chief Complaint  Patient presents with  . Neck Pain    The history is provided by the patient. No language interpreter was used.    HPI Comments: Jared Bass is a 43 y.o. male who presents to the Emergency Department complaining of one day exacerbation of his chronic neck pain.  Pt states he has h/o herniated discs and had a neck fusion in 2008 at C6-C7, and another in 2010 for C5-C7.  Surgeries were done in [place].  He states his C4 is herniated now and he has been recommended to have a TENS unit placed.  He has some neck pain chronically but states that yesterday he slipped on ice and landed on his back, and this morning on waking he noted that his neck pain was greatly worsened.  Pain radiates down his right arm.  He takes Neurontin regularly and took it this morning, without relief.  He denies head impact or LOC in the fall.  He denies new weakness or difficulty walking.   Past Medical History  Diagnosis Date  . Chronic neck pain     Past Surgical History  Procedure Laterality Date  . Neck fusion      c5-7    No family history on file.   History  Substance Use Topics  . Smoking status: Never Smoker   . Smokeless tobacco: Not on file  . Alcohol Use: No     Review of Systems  Musculoskeletal: Positive for neck pain.  Neurological: Negative for weakness.      Allergies  Tramadol and Other  Home Medications   Current Outpatient Rx  Name  Route  Sig  Dispense  Refill  . gabapentin (NEURONTIN) 100 MG capsule   Oral   Take 100 mg by mouth 4 (four) times daily.         Marland Kitchen ibuprofen (ADVIL,MOTRIN) 600 MG tablet   Oral   Take 1 tablet (600 mg total) by mouth every 6 (six) hours as needed for pain.   30  tablet   0   . HYDROcodone-acetaminophen (NORCO) 7.5-325 MG per tablet   Oral   Take 1 tablet by mouth 3 (three) times daily.          BP 146/101  Pulse 99  Temp(Src) 98 F (36.7 C) (Oral)  Resp 18  SpO2 97%  Physical Exam  Nursing note and vitals reviewed. Constitutional: He is oriented to person, place, and time. He appears well-developed and well-nourished. No distress.  HENT:  Head: Normocephalic and atraumatic.  Eyes: EOM are normal.  Neck: Neck supple. No tracheal deviation present.  Diffusely tender throughout cervical spine and paraspinal region.  No crepitus, no step-offs, no overlying skin changes.  Decreased lateral rotation of neck.  Cardiovascular: Normal rate and intact distal pulses.   Pulmonary/Chest: Effort normal. No respiratory distress.  Musculoskeletal: Normal range of motion.       Right shoulder: Normal.       Left shoulder: Normal.  Neurological: He is alert and oriented to person, place, and time.  Decreased right grip strength as compared to left.   Skin: Skin is warm and dry.  Psychiatric: He has a normal mood and affect. His  behavior is normal.    ED Course  Procedures (including critical care time)  DIAGNOSTIC STUDIES: Oxygen Saturation is 97% on room air, normal by my interpretation.    COORDINATION OF CARE: 2:37 PM-Discussed treatment plan which includes pain medication and imaging with pt at bedside and pt agreed to plan.   3:31 PM Cspine Xray without fx or dislocation.  Pt has chronic neck pain, not having adequate pain control at this time.  I will provide a short course of pain medication, muscle relaxant and close follow up with orthopedist/neurosurgeon for further care as needed.  Otherwise, pt mentating appropriately, no other injury noted.      Labs Review Labs Reviewed - No data to display  Imaging Review Dg Cervical Spine Complete  08/20/2013   CLINICAL DATA:  Status post fall now with right arm pain.  EXAM: CERVICAL  SPINE  4+ VIEWS  COMPARISON:  DG CERVICAL SPINE COMPLETE dated 10/10/2012  FINDINGS: The cervical vertebral bodies are preserved in height. The patient has undergone previous anterior cervical fusion across the C5-6 and C6-7 disc levels. The metallic hardware appears intact. The disc space heights of C2-3 through C4-5 appear reasonably well maintained. The prevertebral soft tissue spaces are normal. There is no evidence of a perched facet nor spinous process fracture. There is mild bony encroachment upon the neural foramina at multiple levels bilaterally which appears stable. The odontoid is intact and the lateral masses of C1 align normally with those of C2. The observed portions of the first and second ribs appear normal.  IMPRESSION: 1. There is no evidence of acute cervical spine fracture nor dislocation. 2. There is stable post anterior fusion change is across the C5-6 and C6-7 disc levels. 3. There is stable bilateral bony neural foraminal encroachment at multiple levels. Sign rib   Electronically Signed   By: David  [place]   On: 08/20/2013 15:04    EKG Interpretation   None       MDM   Final diagnoses:  Neck injury  Chronic neck pain    BP 146/101  Pulse 99  Temp(Src) 98 F (36.7 C) (Oral)  Resp 18  SpO2 97%  I have reviewed nursing notes and vital signs. I personally reviewed the imaging tests through PACS system  I reviewed available ER/hospitalization records thought the EMR   I personally performed the services described in this documentation, which was scribed in my presence. The recorded information has been reviewed and is accurate.     Fayrene HelperBowie Christpher Stogsdill, PA-C 08/20/13 1601

## 2013-08-21 NOTE — ED Provider Notes (Signed)
Medical screening examination/treatment/procedure(s) were performed by non-physician practitioner and as supervising physician I was immediately available for consultation/collaboration.  EKG Interpretation   None         Rolland Porter, MD 08/21/13 [phone]

## 2013-08-26 ENCOUNTER — Emergency Department: Payer: Self-pay | Admitting: Internal Medicine

## 2013-09-07 ENCOUNTER — Encounter (HOSPITAL_COMMUNITY): Payer: Self-pay | Admitting: Emergency Medicine

## 2013-09-07 ENCOUNTER — Emergency Department (HOSPITAL_COMMUNITY)
Admission: EM | Admit: 2013-09-07 | Discharge: 2013-09-07 | Disposition: A | Discharge status: Home / Self Care | Payer: Medicare Other | Attending: Emergency Medicine | Admitting: Emergency Medicine

## 2013-09-07 ENCOUNTER — Emergency Department: Payer: Self-pay | Admitting: Emergency Medicine

## 2013-09-07 DIAGNOSIS — Y929 Unspecified place or not applicable: Secondary | ICD-10-CM | POA: Diagnosis not present

## 2013-09-07 DIAGNOSIS — R209 Unspecified disturbances of skin sensation: Secondary | ICD-10-CM | POA: Diagnosis not present

## 2013-09-07 DIAGNOSIS — W010XXA Fall on same level from slipping, tripping and stumbling without subsequent striking against object, initial encounter: Secondary | ICD-10-CM | POA: Diagnosis not present

## 2013-09-07 DIAGNOSIS — G8929 Other chronic pain: Secondary | ICD-10-CM | POA: Diagnosis not present

## 2013-09-07 DIAGNOSIS — M6281 Muscle weakness (generalized): Secondary | ICD-10-CM | POA: Insufficient documentation

## 2013-09-07 DIAGNOSIS — Y939 Activity, unspecified: Secondary | ICD-10-CM | POA: Diagnosis not present

## 2013-09-07 DIAGNOSIS — Z79899 Other long term (current) drug therapy: Secondary | ICD-10-CM | POA: Diagnosis not present

## 2013-09-07 DIAGNOSIS — M2550 Pain in unspecified joint: Secondary | ICD-10-CM | POA: Insufficient documentation

## 2013-09-07 DIAGNOSIS — M542 Cervicalgia: Secondary | ICD-10-CM | POA: Insufficient documentation

## 2013-09-07 DIAGNOSIS — S46909A Unspecified injury of unspecified muscle, fascia and tendon at shoulder and upper arm level, unspecified arm, initial encounter: Secondary | ICD-10-CM | POA: Diagnosis present

## 2013-09-07 DIAGNOSIS — M5412 Radiculopathy, cervical region: Secondary | ICD-10-CM | POA: Diagnosis not present

## 2013-09-07 DIAGNOSIS — S4980XA Other specified injuries of shoulder and upper arm, unspecified arm, initial encounter: Secondary | ICD-10-CM | POA: Diagnosis present

## 2013-09-07 MED ORDER — HYDROCODONE-ACETAMINOPHEN 7.5-325 MG PO TABS: 1.0000 | ORAL_TABLET | Freq: Three times a day (TID) | ORAL | Status: DC | PRN

## 2013-09-07 MED ORDER — MELOXICAM 7.5 MG PO TABS: 15.0000 mg | ORAL_TABLET | Freq: Every day | ORAL | Status: DC

## 2013-09-07 MED ORDER — HYDROCODONE-ACETAMINOPHEN 5-325 MG PO TABS
2.0000 | ORAL_TABLET | Freq: Once | ORAL | Status: AC
  Administered 2013-09-07: 2 via ORAL
  Filled 2013-09-07: qty 2

## 2013-09-07 NOTE — ED Provider Notes (Signed)
CSN: [id number]     Arrival date & time 09/07/13  1614 History   None    This chart was scribed for non-physician practitioner, Antony Madura, PA-C working with Gavin Pound. Oletta Lamas, MD by Arlan Organ, ED Scribe. This patient was seen in room TR07C/TR07C and the patient's care was started at 5:15 PM.   Chief Complaint  Patient presents with  . Arm Pain   The history is provided by the patient. No language interpreter was used.    HPI Comments: Jared Bass is a 43 y.o. male with a PMHx of chronic neck pain, C5-C7 disc herniation, and numbness in R thumb and index finger who presents to the Emergency Department complaining of gradually worsening, constant pain to the right arm x 2 weeks ago secondary to slipping on some ice. He describes the pain as burning and throbbing. He admits to numbness to the right 1st and 2nd digit; he states is not new for him and is secondary to his hx of disc herniation. He has been taking 300 mg of Neurontin TID with moderate temporary relief. However, he denies trying anything OTC or home remedies for his discomfort. He denies fever, new or worsening numbness, pallor and weakness.  Pt has been followed by Dr. Jamse Mead for his neck surgery.   Past Medical History  Diagnosis Date  . Chronic neck pain    Past Surgical History  Procedure Laterality Date  . Neck fusion      c5-7   No family history on file. History  Substance Use Topics  . Smoking status: Never Smoker   . Smokeless tobacco: Not on file  . Alcohol Use: No    Review of Systems  Constitutional: Negative for fever and chills.  Musculoskeletal: Positive for arthralgias.  Neurological: Positive for weakness and numbness.     Allergies  Tramadol and Other  Home Medications   Current Outpatient Rx  Name  Route  Sig  Dispense  Refill  . gabapentin (NEURONTIN) 100 MG capsule   Oral   Take 100 mg by mouth 4 (four) times daily.         Marland Kitchen HYDROcodone-acetaminophen (NORCO) 7.5-325 MG per  tablet   Oral   Take 1 tablet by mouth every 8 (eight) hours as needed for moderate pain.   7 tablet   0   . meloxicam (MOBIC) 7.5 MG tablet   Oral   Take 2 tablets (15 mg total) by mouth daily.   30 tablet   0   . methocarbamol (ROBAXIN) 500 MG tablet   Oral   Take 1 tablet (500 mg total) by mouth 2 (two) times daily.   20 tablet   0     Triage Vitals: BP 139/105  Pulse 106  Temp(Src) 98.9 F (37.2 C) (Oral)  Resp 20  Ht 5\' 8"  (1.727 m)  Wt 204 lb 8 oz (92.761 kg)  BMI 31.10 kg/m2  SpO2 95%   Physical Exam  Nursing note and vitals reviewed. Constitutional: He is oriented to person, place, and time. He appears well-developed and well-nourished. No distress.  HENT:  Head: Normocephalic and atraumatic.  Mouth/Throat: Oropharynx is clear and moist. No oropharyngeal exudate.  Eyes: Conjunctivae and EOM are normal. No scleral icterus.  Neck: Normal range of motion. Neck supple.  TTP of R cervical paraspinal muscles. No cervical midline TTP. No bony deformities or step offs palpated.  Cardiovascular: Normal rate, regular rhythm and intact distal pulses.   Distal radial pulses 2+  bilaterally. Capillary refill normal in bilateral upper extremities.  Pulmonary/Chest: Effort normal. No respiratory distress.  Musculoskeletal: Normal range of motion.  Neurological: He is alert and oriented to person, place, and time.  No gross sensory deficits appreciated. Grip strength mildly decreased on R side. Finger to thumb opposition intact b/l. Normal and equal shoulder shrugging. Strength against resistance of bilateral upper extremities 5/5. Patient endorses to be at baseline neurologically.   Skin: Skin is warm and dry. No rash noted. He is not diaphoretic. No erythema. No pallor.  No evidence of acute or subacute trauma.  Psychiatric: He has a normal mood and affect. His behavior is normal.    ED Course  Procedures (including critical care time)  DIAGNOSTIC STUDIES: Oxygen  Saturation is 95% on RA, adequate by my interpretation.    COORDINATION OF CARE: 5:19 PM- Will give pain medication. Discussed treatment plan with pt at bedside and pt agreed to plan.     Labs Review Labs Reviewed - No data to display Imaging Review No results found.   EKG Interpretation None      MDM   Final diagnoses:  Radiculopathy, cervical    Uncomplicated cervical radiculopathy. Patient well and nontoxic appearing, hemodynamically stable, and afebrile. Patient neurovascularly intact on physical exam. Normal gross sensation to light touch in b/l upper extremities. No weakness in b/l upper extremities against resistance; patient endorses to be at baseline today on exam. No cervical midline TTP; no bony deformities or step offs. Suspect symptoms to be an aggravation of known chronic condition secondary to mechanical fall on ice 2 weeks ago. Do not believe further emergent work up is indicated today; patient agrees with this assessment. He is stable for d/c with Rx for Mobic and instruction to ice area and f/u with his neurosurgeon and PCP. 7 tabs Norco given for breakthrough pain PRN. Return precautions advised and patient agreeable to plan with no unaddressed concerns.  I personally performed the services described in this documentation, which was scribed in my presence. The recorded information has been reviewed and is accurate.   Filed Vitals:   09/07/13 1620 09/07/13 1728  BP: 139/105 144/92  Pulse: 106 100  Temp: 98.9 F (37.2 C) 98.7 F (37.1 C)  TempSrc: Oral Oral  Resp: 20 16  Height: 5\' 8"  (1.727 m)   Weight: 204 lb 8 oz (92.761 kg)   SpO2: 95% 100%     Antony MaduraKelly Berkleigh Beckles, PA-C 09/11/13 1654

## 2013-09-07 NOTE — ED Notes (Signed)
Pt slipped on ice two weeks ago and has hx of neck surgery x 2 and has new arm pain now. Feels like his arm is burning.

## 2013-09-07 NOTE — Discharge Instructions (Signed)
Recommend Mobic as prescribed for symptoms as well as ice to your neck. Refrain from heavy lifting or strenuous activity. Follow up with your neurosurgeon in your primary care doctor. You may take Norco as prescribed for severe pain. Return if symptoms worsen.  Cervical Radiculopathy Cervical radiculopathy happens when a nerve in the neck is pinched or bruised by a slipped (herniated) disk or by arthritic changes in the bones of the cervical spine. This can occur due to an injury or as part of the normal aging process. Pressure on the cervical nerves can cause pain or numbness that runs from your neck all the way down into your arm and fingers. CAUSES  There are many possible causes, including:  Injury.  Muscle tightness in the neck from overuse.  Swollen, painful joints (arthritis).  Breakdown or degeneration in the bones and joints of the spine (spondylosis) due to aging.  Bone spurs that may develop near the cervical nerves. SYMPTOMS  Symptoms include pain, weakness, or numbness in the affected arm and hand. Pain can be severe or irritating. Symptoms may be worse when extending or turning the neck. DIAGNOSIS  Your caregiver will ask about your symptoms and do a physical exam. He or she may test your strength and reflexes. X-rays, CT scans, and MRI scans may be needed in cases of injury or if the symptoms do not go away after a period of time. Electromyography (EMG) or nerve conduction testing may be done to study how your nerves and muscles are working. TREATMENT  Your caregiver may recommend certain exercises to help relieve your symptoms. Cervical radiculopathy can, and often does, get better with time and treatment. If your problems continue, treatment options may include:  Wearing a soft collar for short periods of time.  Physical therapy to strengthen the neck muscles.  Medicines, such as nonsteroidal anti-inflammatory drugs (NSAIDs), oral corticosteroids, or spinal  injections.  Surgery. Different types of surgery may be done depending on the cause of your problems. HOME CARE INSTRUCTIONS   Put ice on the affected area.  Put ice in a plastic bag.  Place a towel between your skin and the bag.  Leave the ice on for 15-20 minutes, 03-04 times a day or as directed by your caregiver.  If ice does not help, you can try using heat. Take a warm shower or bath, or use a hot water bottle as directed by your caregiver.  You may try a gentle neck and shoulder massage.  Use a flat pillow when you sleep.  Only take over-the-counter or prescription medicines for pain, discomfort, or fever as directed by your caregiver.  If physical therapy was prescribed, follow your caregiver's directions.  If a soft collar was prescribed, use it as directed. SEEK IMMEDIATE MEDICAL CARE IF:   Your pain gets much worse and cannot be controlled with medicines.  You have weakness or numbness in your hand, arm, face, or leg.  You have a high fever or a stiff, rigid neck.  You lose bowel or bladder control (incontinence).  You have trouble with walking, balance, or speaking. MAKE SURE YOU:   Understand these instructions.  Will watch your condition.  Will get help right away if you are not doing well or get worse. Document Released: 03/13/2001 Document Revised: 09/10/2011 Document Reviewed: 01/30/2011 [facility] Patient Information 2014 [place], [place].  Neuropathic Pain We often think that pain has a physical cause. If we get rid of the cause, the pain should go away. Nerves themselves can  also cause pain. It is called neuropathic pain, which means nerve abnormality. It may be difficult for the patients who have it and for the treating caregivers. Pain is usually described as acute (short-lived) or chronic (long-lasting). Acute pain is related to the physical sensations caused by an injury. It can last from a few seconds to many weeks, but it usually goes away when  normal healing occurs. Chronic pain lasts beyond the typical healing time. With neuropathic pain, the nerve fibers themselves may be damaged or injured. They then send incorrect signals to other pain centers. The pain you feel is real, but the cause is not easy to find.  CAUSES  Chronic pain can result from diseases, such as diabetes and shingles (an infection related to chickenpox), or from trauma, surgery, or amputation. It can also happen without any known injury or disease. The nerves are sending pain messages, even though there is no identifiable cause for such messages.   Other common causes of neuropathy include diabetes, phantom limb pain, or Regional Pain Syndrome (RPS).  As with all forms of chronic back pain, if neuropathy is not correctly treated, there can be a number of associated problems that lead to a downward cycle for the patient. These include depression, sleeplessness, feelings of fear and anxiety, limited social interaction and inability to do normal daily activities or work.  The most dramatic and mysterious example of neuropathic pain is called "phantom limb syndrome." This occurs when an arm or a leg has been removed because of illness or injury. The brain still gets pain messages from the nerves that originally carried impulses from the missing limb. These nerves now seem to misfire and cause troubling pain.  Neuropathic pain often seems to have no cause. It responds poorly to standard pain treatment. Neuropathic pain can occur after:  Shingles (herpes zoster virus infection).  A lasting burning sensation of the skin, caused usually by injury to a peripheral nerve.  Peripheral neuropathy which is widespread nerve damage, often caused by diabetes or alcoholism.  Phantom limb pain following an amputation.  Facial nerve problems (trigeminal neuralgia).  Multiple sclerosis.  Reflex sympathetic dystrophy.  Pain which comes with cancer and cancer  chemotherapy.  Entrapment neuropathy such as when pressure is put on a nerve such as in carpal tunnel syndrome.  Back, leg, and hip problems (sciatica).  Spine or back surgery.  HIV Infection or AIDS where nerves are infected by viruses. Your caregiver can explain items in the above list which may apply to you. SYMPTOMS  Characteristics of neuropathic pain are:  Severe, sharp, electric shock-like, shooting, lightening-like, knife-like.  Pins and needles sensation.  Deep burning, deep cold, or deep ache.  Persistent numbness, tingling, or weakness.  Pain resulting from light touch or other stimulus that would not usually cause pain.  Increased sensitivity to something that would normally cause pain, such as a pinprick. Pain may persist for months or years following the healing of damaged tissues. When this happens, pain signals no longer sound an alarm about current injuries or injuries about to happen. Instead, the alarm system itself is not working correctly.  Neuropathic pain may get worse instead of better over time. For some people, it can lead to serious disability. It is important to be aware that severe injury in a limb can occur without a proper, protective pain response.Burns, cuts, and other injuries may go unnoticed. Without proper treatment, these injuries can become infected or lead to further disability. Take any injury seriously,  and consult your caregiver for treatment. DIAGNOSIS  When you have a pain with no known cause, your caregiver will probably ask some specific questions:   Do you have any other conditions, such as diabetes, shingles, multiple sclerosis, or HIV infection?  How would you describe your pain? (Neuropathic pain is often described as shooting, stabbing, burning, or searing.)  Is your pain worse at any time of the day? (Neuropathic pain is usually worse at night.)  Does the pain seem to follow a certain physical pathway?  Does the pain come from  an area that has missing or injured nerves? (An example would be phantom limb pain.)  Is the pain triggered by minor things such as rubbing against the sheets at night? These questions often help define the type of pain involved. Once your caregiver knows what is happening, treatment can begin. Anticonvulsant, antidepressant drugs, and various pain relievers seem to work in some cases. If another condition, such as diabetes is involved, better management of that disorder may relieve the neuropathic pain.  TREATMENT  Neuropathic pain is frequently long-lasting and tends not to respond to treatment with narcotic type pain medication. It may respond well to other drugs such as antiseizure and antidepressant medications. Usually, neuropathic problems do not completely go away, but partial improvement is often possible with proper treatment. Your caregivers have large numbers of medications available to treat you. Do not be discouraged if you do not get immediate relief. Sometimes different medications or a combination of medications will be tried before you receive the results you are hoping for. See your caregiver if you have pain that seems to be coming from nowhere and does not go away. Help is available.  SEEK IMMEDIATE MEDICAL CARE IF:   There is a sudden change in the quality of your pain, especially if the change is on only one side of the body.  You notice changes of the skin, such as redness, black or purple discoloration, swelling, or an ulcer.  You cannot move the affected limbs. Document Released: 03/15/2004 Document Revised: 09/10/2011 Document Reviewed: 03/15/2004 [facility] Patient Information 2014 [place], [place].

## 2013-09-07 NOTE — ED Notes (Signed)
Pt reports chronic right arm "burning" since 2007 but states since his fall appx 2 weeks ago the burning sensation has increased. Pt has full ROM and states pain is decreased when he raises his arm above his head. Sensation intact. Pt takes Neurotin for chronic pain, but states pain is now 10/10. NAD. Resting comfortably in chair.

## 2013-09-12 NOTE — ED Provider Notes (Signed)
Medical screening examination/treatment/procedure(s) were performed by non-physician practitioner and as supervising physician I was immediately available for consultation/collaboration.   EKG Interpretation None        Gavin Pound. Zeynep Fantroy, MD 09/12/13 1348

## 2013-09-21 ENCOUNTER — Encounter (HOSPITAL_COMMUNITY): Payer: Self-pay | Admitting: Emergency Medicine

## 2013-09-21 ENCOUNTER — Emergency Department (HOSPITAL_COMMUNITY)
Admission: EM | Admit: 2013-09-21 | Discharge: 2013-09-21 | Disposition: A | Discharge status: Home / Self Care | Payer: Medicare Other | Attending: Emergency Medicine | Admitting: Emergency Medicine

## 2013-09-21 ENCOUNTER — Emergency Department (HOSPITAL_COMMUNITY): Payer: Medicare Other

## 2013-09-21 DIAGNOSIS — Z79899 Other long term (current) drug therapy: Secondary | ICD-10-CM | POA: Insufficient documentation

## 2013-09-21 DIAGNOSIS — IMO0002 Reserved for concepts with insufficient information to code with codable children: Secondary | ICD-10-CM | POA: Insufficient documentation

## 2013-09-21 DIAGNOSIS — G8929 Other chronic pain: Secondary | ICD-10-CM | POA: Diagnosis not present

## 2013-09-21 DIAGNOSIS — Y939 Activity, unspecified: Secondary | ICD-10-CM | POA: Diagnosis not present

## 2013-09-21 DIAGNOSIS — R202 Paresthesia of skin: Secondary | ICD-10-CM

## 2013-09-21 DIAGNOSIS — S0993XA Unspecified injury of face, initial encounter: Secondary | ICD-10-CM | POA: Insufficient documentation

## 2013-09-21 DIAGNOSIS — R209 Unspecified disturbances of skin sensation: Secondary | ICD-10-CM | POA: Insufficient documentation

## 2013-09-21 DIAGNOSIS — R5383 Other fatigue: Secondary | ICD-10-CM | POA: Diagnosis not present

## 2013-09-21 DIAGNOSIS — R5381 Other malaise: Secondary | ICD-10-CM | POA: Diagnosis not present

## 2013-09-21 DIAGNOSIS — Y929 Unspecified place or not applicable: Secondary | ICD-10-CM | POA: Insufficient documentation

## 2013-09-21 DIAGNOSIS — Z9889 Other specified postprocedural states: Secondary | ICD-10-CM | POA: Diagnosis not present

## 2013-09-21 DIAGNOSIS — M25549 Pain in joints of unspecified hand: Secondary | ICD-10-CM | POA: Diagnosis not present

## 2013-09-21 DIAGNOSIS — R296 Repeated falls: Secondary | ICD-10-CM | POA: Diagnosis not present

## 2013-09-21 DIAGNOSIS — M542 Cervicalgia: Secondary | ICD-10-CM

## 2013-09-21 DIAGNOSIS — S199XXA Unspecified injury of neck, initial encounter: Principal | ICD-10-CM

## 2013-09-21 MED ORDER — HYDROCODONE-ACETAMINOPHEN 5-325 MG PO TABS
1.0000 | ORAL_TABLET | ORAL | Status: DC | PRN
Start: 2013-09-21 — End: 2013-10-09

## 2013-09-21 MED ORDER — PREDNISONE 20 MG PO TABS: ORAL_TABLET | ORAL | Status: DC

## 2013-09-21 MED ORDER — HYDROCODONE-ACETAMINOPHEN 5-325 MG PO TABS
2.0000 | ORAL_TABLET | Freq: Once | ORAL | Status: AC
  Administered 2013-09-21: 2 via ORAL
  Filled 2013-09-21: qty 2

## 2013-09-21 NOTE — ED Notes (Addendum)
staes that he fell on ice a few months back and now has burning in left atrm was just seen for same pain in rt arm has been goping on for a few weeks

## 2013-09-21 NOTE — ED Notes (Signed)
Patient transported to MRI 

## 2013-09-21 NOTE — Discharge Instructions (Signed)
1. Medications: usual home medications, vicodin for pain, prednisone 2. Treatment: rest, drink plenty of fluids,  3. Follow Up: Please followup with Dr. Danielle Dess for discussion of your diagnoses and further evaluation after today's visit; if you do not have a primary care doctor use the resource guide provided to find one;    Emergency Department Resource Guide 1) Find a Doctor and Pay Out of Pocket Although you won't have to find out who is covered by your insurance plan, it is a good idea to ask around and get recommendations. You will then need to call the office and see if the doctor you have chosen will accept you as a new patient and what types of options they offer for patients who are self-pay. Some doctors offer discounts or will set up payment plans for their patients who do not have insurance, but you will need to ask so you aren't surprised when you get to your appointment.  2) Contact Your Local Health Department Not all health departments have doctors that can see patients for sick visits, but many do, so it is worth a call to see if yours does. If you don't know where your local health department is, you can check in your phone book. The CDC also has a tool to help you locate your state's health department, and many state websites also have listings of all of their local health departments.  3) Find a Walk-in Clinic If your illness is not likely to be very severe or complicated, you may want to try a walk in clinic. These are popping up all over the country in pharmacies, drugstores, and shopping centers. They're usually staffed by nurse practitioners or physician assistants that have been trained to treat common illnesses and complaints. They're usually fairly quick and inexpensive. However, if you have serious medical issues or chronic medical problems, these are probably not your best option.  No Primary Care Doctor: - Call Health Connect at  [phone] - they can help you locate a  primary care doctor that  accepts your insurance, provides certain services, etc. - Physician Referral Service- [phone]  Chronic Pain Problems: Organization         Address  Phone   Notes  [facility]  [phone] Patients need to be referred by their primary care doctor.   Medication Assistance: Organization         Address  Phone   Notes  [facility] [address] [place] [place], [place] [address] [phone] --Must be a resident of [facility] -- Must have NO insurance coverage whatsoever (no Medicaid/ Medicare, etc.) -- The pt. MUST have a primary care doctor that directs their care regularly and follows them in the community   [facility]ist  [phone]   [employer]  [phone]    Agencies that provide inexpensive medical care: Organization         Address  Phone   Notes  [facility]  [phone]   [facility]    [phone]   [facility] [address] [place], [place] [address] [phone]   [facility] [address] [place], [place] [phone]   Planned Parenthood    [phone]   [facility]    [phone]   [facility]  [address], [place] Phone:  [phone], Fax:  [phone] Hours  of Operation:  9 am - 6 pm, M-F.  Also accepts Medicaid/Medicare and self-pay.  [facility] for [place] [place], [address], [place] Phone: [phone], Fax: [phone]. Hours of Operation:  8:30 am - 5:30 pm, M-F.  Also accepts Medicaid and self-pay.  [facility] [place] [address] [place] Phone: [phone]   [place], [place], [place] [phone], Ext. [phone] Mondays & Thursdays: 7-9 AM.  First 15 patients are seen on a first come, first serve basis.    [place]  Providers:  Organization         Address  Phone   Notes  [facility] [address], [place] [phone] Also accepts self-pay patients.  [facility] [address], [place]  [phone]   [place], [address], [place] [phone]   [facility] [address] [place] [phone]   Lucianne Lei [address], [place]   [phone] Only accepts [place] Access [place] patients after they have their name applied to their card.   Self-Pay (no insurance) in [facility]:  Organization         Address  Phone   Notes  Sickle Cell Patients, [facility] [place] [phone]   [facility] [place] [phone]   [facility] [place]  [place], [place], [place] [phone]   [facility]/Dr. Osei-Bonsu  [address] [place] or [address], [place] [phone] Phone number for both [place] and [place] locations is the same.  [facility] [address], [place] [phone]   [facility] [address] [place] or [address] [phone]   [facility] [address], [place] [phone], phone; [phone], fax Sees patients 1st and 3rd Saturday of every month.  Must not qualify for public or private insurance (i.e. Medicaid, Medicare, [place] Health Choice, Veterans' Benefits)  Household income should be no more than 200% of the poverty level The clinic cannot treat you if you are pregnant or think you are pregnant  Sexually transmitted diseases are not treated at the clinic.    Dental Care: Organization         Address  Phone  Notes  [facility] Department [facility] [place] [phone] Accepts children up to age 4 who are enrolled in [place] or [place]; pregnant women with a Medicaid card; and children who have applied for Medicaid or [place] Health Choice, but were declined, whose parents can pay a reduced fee at time of service.  [facility] Department of [facility]  [address], [place] [phone] Accepts children up to age 85 who are enrolled in [place] or [place]; pregnant women with a Medicaid card; and children who have applied for Medicaid or [place] Health Choice, but were declined, whose parents can pay a reduced fee at time of service.  [facility]  [place] [phone] Patients are seen by appointment only. Walk-ins are not accepted. [place] will see  patients 58 years of age and older. Monday - Tuesday (8am-5pm) Most Wednesdays (8:30-5pm) $30 per visit, cash only  [facility] Adult [facility] Access PRO[facility]  [address], [facility] [phone] Patients are seen by appointment only. Walk-ins are not accepted. [place] will see patients 67 years of age and older. One Wednesday Evening (Monthly: Volunteer Based).  $30 per visit, cash only  [place]  [phone] for adults; Children under age 67, call Graduate Pediatric Dentistry at [phone]. Children aged 34-14, please call [phone] to request a pediatric application.  Dental services are provided in all areas of dental care including fillings, crowns and bridges, complete and partial dentures, implants, gum treatment, root canals, and extractions. Preventive care is also provided. Treatment is provided to both adults and children. Patients are selected via a lottery and there is often a waiting list.   [facility] [address], [place]  [phone] [email or web address]   [facility]  [address] [place], [place] [phone], Ext. [phone] Second and Fourth Thursday of each month, opens at 6:30 AM; Clinic ends at 9 AM.  Patients are seen on a first-come first-served basis, and a limited number are seen during each clinic.   [facility]  [address] Hillard Danker [place], [place] [phone]   Eligibility Requirements You must have lived in [place], [place], or [place] counties for at least the last three months.   You cannot be eligible for state or federal sponsored [employer], including [employer], [place], or [employer].   You generally cannot be eligible for healthcare insurance through your employer.    How to apply: Eligibility screenings are held every Tuesday and Wednesday afternoon from 1:00 pm until 4:00 pm. You do not need an appointment for the interview!  [facility] [address] [place], [place]   [place]  [place] Department  [place]  [phone]    Behavioral Health Resources in the Community: Intensive Outpatient Programs Organization         Address  Phone  Notes  [place]. [address] [place], [place] [phone]   [facility] Outpatient [address], [place], [place]   ADS: [facility] [address] [place], [place]   [place] [address]  [place], [place] or [phone]   Substance Abuse Resources Organization         Address  Phone  Notes  Alcohol and Drug Services  [phone]   [place]  [phone]   [facility] [place]   Chinita Pester  [phone]   Residential & Outpatient Substance Abuse Program  [phone]   Psychological Services Organization         Address  Phone  Notes  [facility]  [place]  [phone]   [place] [address] [place] or [phone]    Mobile Crisis Teams Organization         Address  Phone  Notes  [facility], Mobile Crisis Care Unit  [phone]   [facility]  [address] [place], [place]   [facility] [address] [place] [phone]    Self-Help/Support Groups Organization  Address  Phone             Notes  [place]. of [place] - variety of support groups  [place] Call for more information  Narcotics Anonymous (NA), [facility] [address], [employer] [place]  2 meetings at this location   Special educational needs teacher         Address  Phone  Notes  [facility] Residential Treatment [place],    [place]  [phone]   [facility]  [address], [place] 329924, [place], [place]   [place], [place] [phone] Admissions: 8am-3pm M-F  Incentives Substance [place] [address],    [place], [place] [phone]   [facility] [address] [place], [place], [place]   [facility] [address],  [place], [place]   [facility] - Intensive Outpatient [address], [place], [place]   [facility].,  [place], [place] [phone] or [phone]   [facility] (RTS) [address] [place], [place] Accepts Medicaid  [facility] [address]  [place] [phone] Substance Abuse/Addiction Treatment   [facility] Organization         Address  Phone  Notes  [facility]  [phone]   Domenic Schwab, PhD [address] Arlis Porta [place], [place]   [phone] or [phone]    [place], [place] [phone]   [facility] [address], [place], [place] [phone] Insurance/Medicaid/sponsorship through [facility] and Families [address], [address] [place]                                    [place], [place] [phone] [place] [address], [place] [phone]    Dr. Adele Schilder  [phone]   [facility] Dept. 1) [address], [place] 2) [place] 3)  [place] [address], [place] [phone]  [phone]   [place] [phone] or [phone] (After Hours)

## 2013-09-21 NOTE — ED Provider Notes (Signed)
CSN: [id number]     Arrival date & time 09/21/13  1301 History   First MD Initiated Contact with Patient 09/21/13 1501     Chief Complaint  Patient presents with  . Hand Pain     (Consider location/radiation/quality/duration/timing/severity/associated sxs/prior Treatment) The history is provided by the patient and medical records. No language interpreter was used.    Jared Bass is a 43 y.o. male  with a hx of chronic neck pain due to C4 disc herniation presents to the Emergency Department complaining of gradual, persistent, progressively worsening burning sensation present on the left onset 4 days ago.  Pt reports chronic paresthesias in the right arm since 2007. This was aggravated with a fall on the ice 1 month ago.  Pt with Hx of neck fusion in 2008 and 2010 both done by [facility] in [place].  Pt has not ever seen a neurosurgeon here in [place].  Pt was last evaluated by him in 2010.  Last MRI was in 2014 which showed a worsening of the C4 herniation.  Pt reports medicaid issues and is attempting a new referral to pain management.  He was Rx vicodin and neurontin which does help the pain.  Holding his hands above his head makes it better and nothing makes it worse.  Pt with associated neck "tension" which has been worse since the fall.   Pt denies fever, chills, headache, chest pain, shortness of breath, abdominal pain, nausea, vomiting, diarrhea, increased weakness in the right hand, weakness in the left hand, weakness in the legs.  Patient also denies difficulty ambulating, loss of bowel or bladder control or saddle anesthesia..     Past Medical History  Diagnosis Date  . Chronic neck pain    Past Surgical History  Procedure Laterality Date  . Neck fusion      c5-7   No family history on file. History  Substance Use Topics  . Smoking status: Never Smoker   . Smokeless tobacco: Not on file  . Alcohol Use: No    Review of Systems  Constitutional: Negative for fever,  diaphoresis, appetite change, fatigue and unexpected weight change.  HENT: Negative for mouth sores.   Eyes: Negative for visual disturbance.  Respiratory: Negative for cough, chest tightness, shortness of breath and wheezing.   Cardiovascular: Negative for chest pain.  Gastrointestinal: Negative for nausea, vomiting, abdominal pain, diarrhea and constipation.  Endocrine: Negative for polydipsia, polyphagia and polyuria.  Genitourinary: Negative for dysuria, urgency, frequency and hematuria.  Musculoskeletal: Positive for neck pain. Negative for back pain and neck stiffness.  Skin: Negative for rash.  Allergic/Immunologic: Negative for immunocompromised state.  Neurological: Positive for weakness and numbness. Negative for syncope, light-headedness and headaches.  Hematological: Does not bruise/bleed easily.  Psychiatric/Behavioral: Negative for sleep disturbance. The patient is not nervous/anxious.       Allergies  Tramadol and Other  Home Medications   Current Outpatient Rx  Name  Route  Sig  Dispense  Refill  . gabapentin (NEURONTIN) 100 MG capsule   Oral   Take 100 mg by mouth 4 (four) times daily.         Marland Kitchen HYDROcodone-acetaminophen (NORCO/VICODIN) 5-325 MG per tablet   Oral   Take 1-2 tablets by mouth every 4 (four) hours as needed.   15 tablet   0   . predniSONE (DELTASONE) 20 MG tablet      3 tabs po daily x 3 days, then 2 tabs x 3 days, then 1.5 tabs x  3 days, then 1 tab x 3 days, then 0.5 tabs x 3 days   27 tablet   0    BP 119/90  Pulse 81  Temp(Src) 98.5 F (36.9 C) (Oral)  Resp 20  Wt 200 lb (90.719 kg)  SpO2 99% Physical Exam  Nursing note and vitals reviewed. Constitutional: He is oriented to person, place, and time. He appears well-developed and well-nourished. No distress.  Awake, alert, nontoxic appearance  HENT:  Head: Normocephalic and atraumatic.  Mouth/Throat: Oropharynx is clear and moist. No oropharyngeal exudate.  Eyes: Conjunctivae  and EOM are normal. Pupils are equal, round, and reactive to light. No scleral icterus.  Neck: Normal range of motion and full passive range of motion without pain. Neck supple. Spinous process tenderness and muscular tenderness present. No rigidity. No edema and normal range of motion present.  Full range of motion with pain Spinous process tenderness and bilateral paraspinal muscle tenderness to palpation  Cardiovascular: Normal rate, regular rhythm, normal heart sounds and intact distal pulses.   No murmur heard. Pulmonary/Chest: Effort normal and breath sounds normal. No respiratory distress. He has no wheezes. He has no rales.  Abdominal: Soft. Bowel sounds are normal. He exhibits no distension and no mass. There is no tenderness. There is no rebound and no guarding.  Musculoskeletal: Normal range of motion. He exhibits no edema.  Lymphadenopathy:    He has no cervical adenopathy.  Neurological: He is alert and oriented to person, place, and time. He has normal reflexes. No cranial nerve deficit. He exhibits normal muscle tone. Coordination normal.  Speech is clear and goal oriented, follows commands Cranial nerves III - XII without deficit, no facial droop Normal 5/5 strength in lower extremities bilaterally, with strong and equal dorsiflexion and plantarflexion  3/5 grip strength in the right hand and 5/5 grip strength in the left hand  Sensation decreased in C6 distribution of the right hand and forearm at baseline otherwise intact to light and sharp touch in bilateral upper extremities; burning sensation reported along the C5-6 distribution of the left arm however intact objective sharp sensation present in this area Moves extremities without ataxia, coordination intact Normal finger to nose and rapid alternating movements Neg romberg, no pronator drift Normal gait Normal heel-shin and balance   Skin: Skin is warm and dry. No rash noted. He is not diaphoretic. No erythema.   Psychiatric: He has a normal mood and affect. His behavior is normal. Judgment and thought content normal.    ED Course  Procedures (including critical care time) Labs Review Labs Reviewed - No data to display Imaging Review Mr Cervical Spine Wo Contrast  09/21/2013   CLINICAL DATA:  Left arm pain and numbness. History of previous fusion.  EXAM: MRI CERVICAL SPINE WITHOUT CONTRAST  TECHNIQUE: Multiplanar, multisequence MR imaging was performed. No intravenous contrast was administered.  COMPARISON:  DG CERVICAL SPINE COMPLETE dated 08/20/2013; CT C SPINE W/O CM dated 07/24/2009  FINDINGS: Mild reversal of the normal cervical lordotic curve. Previous C5-C6-C7 fusion. Hypointense band extending through the C5-6 interspace, with subsidence of the interbody cage suggesting nonunion. Confirm with CT cervical spine without contrast. Probable solid fusion C6-C7. No abnormal cord signal. Craniocervical junction unremarkable. Flow voids are preserved.  The individual disc spaces were examined as follows:  C2-3:  Normal.  C3-4: Central and leftward protrusion with uncinate spurring. Moderate cord flattening without abnormal cord signal; Canal diameter 5-7 mm. Left C4 nerve root impingement is present.  C4-5:  Unremarkable.  C5-6: Suspected pseudarthrosis. Residual osteophytic spurring merges with right-sided uncinate hypertrophy compressing the cord and narrowing the right neural foramen; Canal diameter 5-7 mm. Right C6 nerve root impingement is present.  C6-7: Residual uncinate spurring on the right narrows the foramen. No central canal stenosis. Right C7 nerve root impingement likely.  C7-T1: Bilateral facet arthropathy. No disc protrusion or impingement.  Compared with prior studies, the MR findings are similar.  IMPRESSION: The dominant left-sided abnormality is at C3-C4, where central and leftward protrusion with uncinate spurring compresses the cord and narrows the left neural foramen, affecting the left C4  nerve root.  Suspected pseudarthrosis C5-C6.  Residual osseous overgrowth narrows the spinal canal and/or foramina at C5-6 and C6-7 on the right.   Electronically Signed   By: Davonna Belling M.D.   On: 09/21/2013 18:06     EKG Interpretation None      MDM   Final diagnoses:  Chronic neck pain  Paresthesias   Jared Bass presents with persistently increased neck pain since a fall approximately one month ago. Now for his work of burning to the left arm which is new. Patient with baseline numbness and weakness in the right arm.  Record review shows plain films on 08/21/2011 without acute abnormality.  Will give pain control and obtain MRI.  6:40 PM MRI with The dominant left-sided abnormality is at C3-C4, where central and leftward protrusion with uncinate spurring compresses the cord and narrows the left neural foramen, affecting the left C4 nerve root.    I personally reviewed the imaging tests through PACS system  I reviewed available ER/hospitalization records through the EMR  Discussed with Laurey Arrow, MD who recommends neurosurgery consult.    7:54 PM Discussed with Dr. Danielle Dess recommends course of steroids and pain control the need for emergent surgery.  Patient with no new neurologic deficits on repeat exam.  He remains alert, oriented, nontoxic and nonseptic appearing.  Her come in close followup with neurosurgery. Also discussed reasons to return immediately to the emergency department including LT angulating, loss of bowel or bladder control.  It has been determined that no acute conditions requiring further emergency intervention are present at this time. The patient/guardian have been advised of the diagnosis and plan. We have discussed signs and symptoms that warrant return to the ED, such as changes or worsening in symptoms.   Vital signs are stable at discharge.   BP 119/90  Pulse 81  Temp(Src) 98.5 F (36.9 C) (Oral)  Resp 20  Wt 200 lb (90.719 kg)  SpO2  99%  Patient/guardian has voiced understanding and agreed to follow-up with the PCP or specialist.       Dierdre Forth, PA-C 09/21/13 2001

## 2013-09-21 NOTE — ED Notes (Signed)
PA at bedside.

## 2013-09-22 NOTE — ED Provider Notes (Signed)
Medical screening examination/treatment/procedure(s) were performed by non-physician practitioner and as supervising physician I was immediately available for consultation/collaboration.   EKG Interpretation None        Glynn Octave, MD 09/22/13 [phone]

## 2013-10-09 ENCOUNTER — Emergency Department (HOSPITAL_COMMUNITY): Payer: Medicare Other

## 2013-10-09 ENCOUNTER — Emergency Department (HOSPITAL_COMMUNITY)
Admission: EM | Admit: 2013-10-09 | Discharge: 2013-10-09 | Disposition: A | Discharge status: Home / Self Care | Payer: Medicare Other | Attending: Emergency Medicine | Admitting: Emergency Medicine

## 2013-10-09 ENCOUNTER — Encounter (HOSPITAL_COMMUNITY): Payer: Self-pay | Admitting: Emergency Medicine

## 2013-10-09 DIAGNOSIS — S59909A Unspecified injury of unspecified elbow, initial encounter: Secondary | ICD-10-CM | POA: Diagnosis present

## 2013-10-09 DIAGNOSIS — W208XXA Other cause of strike by thrown, projected or falling object, initial encounter: Secondary | ICD-10-CM | POA: Diagnosis not present

## 2013-10-09 DIAGNOSIS — S4980XA Other specified injuries of shoulder and upper arm, unspecified arm, initial encounter: Secondary | ICD-10-CM | POA: Diagnosis not present

## 2013-10-09 DIAGNOSIS — I10 Essential (primary) hypertension: Secondary | ICD-10-CM | POA: Insufficient documentation

## 2013-10-09 DIAGNOSIS — Z981 Arthrodesis status: Secondary | ICD-10-CM | POA: Diagnosis not present

## 2013-10-09 DIAGNOSIS — Y9289 Other specified places as the place of occurrence of the external cause: Secondary | ICD-10-CM | POA: Insufficient documentation

## 2013-10-09 DIAGNOSIS — S5010XA Contusion of unspecified forearm, initial encounter: Secondary | ICD-10-CM | POA: Insufficient documentation

## 2013-10-09 DIAGNOSIS — Y9389 Activity, other specified: Secondary | ICD-10-CM | POA: Insufficient documentation

## 2013-10-09 DIAGNOSIS — S6990XA Unspecified injury of unspecified wrist, hand and finger(s), initial encounter: Secondary | ICD-10-CM | POA: Diagnosis present

## 2013-10-09 DIAGNOSIS — G8929 Other chronic pain: Secondary | ICD-10-CM | POA: Diagnosis not present

## 2013-10-09 DIAGNOSIS — S46909A Unspecified injury of unspecified muscle, fascia and tendon at shoulder and upper arm level, unspecified arm, initial encounter: Secondary | ICD-10-CM | POA: Diagnosis not present

## 2013-10-09 DIAGNOSIS — Z79899 Other long term (current) drug therapy: Secondary | ICD-10-CM | POA: Diagnosis not present

## 2013-10-09 DIAGNOSIS — S5011XA Contusion of right forearm, initial encounter: Secondary | ICD-10-CM

## 2013-10-09 MED ORDER — HYDROCODONE-ACETAMINOPHEN 5-325 MG PO TABS
1.0000 | ORAL_TABLET | Freq: Four times a day (QID) | ORAL | Status: DC | PRN
Start: 2013-10-09 — End: 2013-10-14

## 2013-10-09 MED ORDER — HYDROCODONE-ACETAMINOPHEN 5-325 MG PO TABS
1.0000 | ORAL_TABLET | Freq: Once | ORAL | Status: AC
  Administered 2013-10-09: 1 via ORAL
  Filled 2013-10-09: qty 1

## 2013-10-09 NOTE — ED Notes (Signed)
He was working today and a heavy wood beam fell onto his R arm around elbow and forearm. C/o pain since. Cms intact.

## 2013-10-09 NOTE — ED Provider Notes (Signed)
CSN: [id number]     Arrival date & time 10/09/13  1438 History   First MD Initiated Contact with Patient 10/09/13 1449    This chart was scribed for Francee Piccolo PA-C, a non-physician practitioner working with Gwyneth Sprout, MD by Lewanda Rife, ED Scribe. This patient was seen in room TR05C/TR05C and the patient's care was started at 4:11 PM      Chief Complaint  Patient presents with  . Arm Injury     (Consider location/radiation/quality/duration/timing/severity/associated sxs/prior Treatment) The history is provided by the patient. No language interpreter was used.   HPI Comments: Jared Bass is a 43 y.o. male who presents to the Emergency Department complaining of constant right forearm pain radiating to right elbow onset 10 am this morning. Reports a "house beam" landed on right arm. Describes pain as gradually worsening in severity. Reports associated baseline numbness, and limited ROM secondary to pain. Denies any alleviating factors. Reports pain is exacerbated by touch and movement. Denies associated neck pain, head injury, and LOC.  Past Medical History  Diagnosis Date  . Chronic neck pain    Past Surgical History  Procedure Laterality Date  . Neck fusion      c5-7   History reviewed. No pertinent family history. History  Substance Use Topics  . Smoking status: Never Smoker   . Smokeless tobacco: Not on file  . Alcohol Use: No    Review of Systems  Constitutional: Negative for fever.  Musculoskeletal: Positive for arthralgias and myalgias.  Skin: Negative for wound.  Psychiatric/Behavioral: Negative for confusion.      Allergies  Tramadol and Other  Home Medications   Current Outpatient Rx  Name  Route  Sig  Dispense  Refill  . gabapentin (NEURONTIN) 100 MG capsule   Oral   Take 100 mg by mouth 4 (four) times daily.         . methocarbamol (ROBAXIN) 500 MG tablet   Oral   Take 500 mg by mouth every 6 (six) hours as needed for muscle  spasms.         Marland Kitchen HYDROcodone-acetaminophen (NORCO/VICODIN) 5-325 MG per tablet   Oral   Take 1 tablet by mouth every 6 (six) hours as needed for severe pain.   10 tablet   0    BP 132/100  Pulse 103  Temp(Src) 98.7 F (37.1 C) (Oral)  Resp 18  SpO2 99% Physical Exam  Nursing note and vitals reviewed. Constitutional: He is oriented to person, place, and time. He appears well-developed and well-nourished. No distress.  HENT:  Head: Normocephalic and atraumatic.  Right Ear: External ear normal.  Left Ear: External ear normal.  Nose: Nose normal.  Mouth/Throat: Oropharynx is clear and moist.  Eyes: Conjunctivae are normal.  Neck: Normal range of motion. Neck supple. No spinous process tenderness and no muscular tenderness present.  Cardiovascular: Normal rate.   Pulmonary/Chest: Effort normal.  Abdominal: Soft.  Musculoskeletal:       Right shoulder: He exhibits tenderness. He exhibits normal range of motion, no bony tenderness, no deformity and no laceration.       Left shoulder: Normal.       Right elbow: Normal.He exhibits normal range of motion, no swelling, no effusion, no deformity and no laceration. No tenderness found.       Left elbow: Normal.       Right wrist: He exhibits decreased range of motion (pain).       Left wrist: Normal.  Right forearm: He exhibits tenderness. He exhibits no bony tenderness, no edema, no deformity and no laceration.       Right hand: He exhibits normal range of motion, no tenderness, no bony tenderness, normal capillary refill, no deformity and no laceration. Decreased sensation (baseline ulnar distr from surgery) noted.       Left hand: Normal.  Small hematoma noted to right forearm  Neurological: He is alert and oriented to person, place, and time.  Skin: Skin is warm and dry. He is not diaphoretic.  Psychiatric: He has a normal mood and affect.    ED Course  Procedures (including critical care time) COORDINATION OF  CARE:  Nursing notes reviewed. Vital signs reviewed. Initial pt interview and examination performed.   Filed Vitals:   10/09/13 1443 10/09/13 1557  BP: 124/89 132/100  Pulse: 120 103  Temp: 98.7 F (37.1 C) 98.7 F (37.1 C)  TempSrc: Oral Oral  Resp: 18 18  SpO2: 97% 99%    4:11 PM-Discussed work up plan with pt at bedside, which includes  Orders Placed This Encounter  Procedures  . DG Forearm Right    Standing Status: Standing     Number of Occurrences: 1     Standing Expiration Date:     Order Specific Question:  Reason for exam:    Answer:  ARM INJURY  . DG Elbow Complete Right    Standing Status: Standing     Number of Occurrences: 1     Standing Expiration Date:     Order Specific Question:  Reason for exam:    Answer:  ARM INJURY  . Vital signs    Standing Status: Standing     Number of Occurrences: 1     Standing Expiration Date:   . Apply ice to affected area    Standing Status: Standing     Number of Occurrences: 20     Standing Expiration Date:   . Pt agrees with plan.   Treatment plan initiated: Medications  HYDROcodone-acetaminophen (NORCO/VICODIN) 5-325 MG per tablet 1 tablet (1 tablet Oral Given 10/09/13 1556)     Initial diagnostic testing ordered.       Labs Review Labs Reviewed - No data to display Imaging Review Dg Elbow Complete Right  10/09/2013   CLINICAL DATA:  Elbow and forearm pain status post trauma  EXAM: RIGHT ELBOW - COMPLETE 3+ VIEW  COMPARISON:  DG FOREARM*R* dated 10/09/2013  FINDINGS: Four views of the right elbow reveal the bones to be adequately mineralized. There is no evidence of an acute fracture nor dislocation. There is no joint effusion. The overlying soft tissues are normal in appearance.  IMPRESSION: There is no acute bony abnormality of the right elbow.   Electronically Signed   By: David  [place]   On: 10/09/2013 15:43   Dg Forearm Right  10/09/2013   CLINICAL DATA:  Forearm and elbow pain status post trauma  EXAM:  RIGHT FOREARM - 2 VIEW  COMPARISON:  None.  FINDINGS: AP and lateral views of the right forearm reveal the bones to be adequately mineralized. There is no evidence of an acute fracture or dislocation. The overlying soft tissues exhibit no abnormal densities. The observed portions of the elbow and wrist are normal.  IMPRESSION: No acute bony abnormality of the right forearm is demonstrated.   Electronically Signed   By: David  [place]   On: 10/09/2013 15:42     EKG Interpretation None      MDM  Final diagnoses:  Contusion of right forearm  Hypertension    Filed Vitals:   10/09/13 1557  BP: 132/100  Pulse: 103  Temp: 98.7 F (37.1 C)  Resp: 18   Afebrile, NAD, non-toxic appearing, AAOx4.   1) Forearm pain: Neurovascularly intact. Normal sensation. Imaging shows no fracture. Directed pt to ice injury, take acetaminophen or ibuprofen for pain, and to elevate and rest the injury when possible.   2) HTN: Patient noted to be hypertensive in the emergency department.  No signs of hypertensive urgency.  Discussed with patient the need for close follow-up and management by their primary care physician.     I personally performed the services described in this documentation, which was scribed in my presence. The recorded information has been reviewed and is accurate.      Jeannetta Ellis, PA-C 10/09/13 1615

## 2013-10-09 NOTE — Discharge Instructions (Signed)
Please follow up with your primary care physician in 1-2 days. If you do not have one [facility] listed [facility]. Please take pain medication and/or muscle relaxants as prescribed and as needed for pain. Please do not drive on narcotic pain medication or on muscle relaxants. Please read all discharge instructions and return precautions.   Contusion A contusion is a deep bruise. Contusions are the result of an injury that caused bleeding under the skin. The contusion may turn blue, purple, or yellow. Minor injuries will give you a painless contusion, but more severe contusions may stay painful and swollen for a few weeks.  CAUSES  A contusion is usually caused by a blow, trauma, or direct force to an area of the body. SYMPTOMS   Swelling and redness of the injured area.  Bruising of the injured area.  Tenderness and soreness of the injured area.  Pain. DIAGNOSIS  The diagnosis can be made by taking a history and physical exam. An X-ray, CT scan, or MRI may be needed to determine if there were any associated injuries, such as fractures. TREATMENT  Specific treatment will depend on what area of the body was injured. In general, the best treatment for a contusion is resting, icing, elevating, and applying cold compresses to the injured area. Over-the-counter medicines may also be recommended for pain control. Ask your caregiver what the best treatment is for your contusion. HOME CARE INSTRUCTIONS   Put ice on the injured area.  Put ice in a plastic bag.  Place a towel between your skin and the bag.  Leave the ice on for 15-20 minutes, 03-04 times a day.  Only take over-the-counter or prescription medicines for pain, discomfort, or fever as directed by your caregiver. Your caregiver may recommend avoiding anti-inflammatory medicines (aspirin, ibuprofen, and naproxen) for 48 hours because these medicines may increase bruising.  Rest the injured  area.  If possible, elevate the injured area to reduce swelling. SEEK IMMEDIATE MEDICAL CARE IF:   You have increased bruising or swelling.  You have pain that is getting worse.  Your swelling or pain is not relieved with medicines. MAKE SURE YOU:   Understand these instructions.  Will watch your condition.  Will get help right away if you are not doing well or get worse. Document Released: 03/28/2005 Document Revised: 09/10/2011 Document Reviewed: 04/23/2011 [facility] Patient Information 2014 [place], [place].   Arterial Hypertension Arterial hypertension (high blood pressure) is a condition of elevated pressure in your blood vessels. Hypertension over a long period of time is a risk factor for strokes, heart attacks, and heart failure. It is also the leading cause of kidney (renal) failure.  CAUSES   In Adults -- Over 90% of all hypertension has no known cause. This is called essential or primary hypertension. In the other 10% of people with hypertension, the increase in blood pressure is caused by another disorder. This is called secondary hypertension. Important causes of secondary hypertension are:  Heavy alcohol use.  Obstructive sleep apnea.  Hyperaldosterosim (Conn's syndrome).  Steroid use.  Chronic kidney failure.  Hyperparathyroidism.  Medications.  Renal artery stenosis.  Pheochromocytoma.  Cushing's disease.  Coarctation of the aorta.  Scleroderma renal crisis.  Licorice (in excessive amounts).  Drugs (cocaine, methamphetamine). Your caregiver can explain any items above that apply to you.  In Children -- Secondary hypertension is more common and should always be considered.  Pregnancy -- Few women of childbearing age have high blood  pressure. However, up to 10% of them develop hypertension of pregnancy. Generally, this will not harm the woman. It may be a sign of 3 complications of pregnancy: preeclampsia, HELLP syndrome, and eclampsia. Follow up  and control with medication is necessary. SYMPTOMS   This condition normally does not produce any noticeable symptoms. It is usually found during a routine exam.  Malignant hypertension is a late problem of high blood pressure. It may have the following symptoms:  Headaches.  Blurred vision.  End-organ damage (this means your kidneys, heart, lungs, and other organs are being damaged).  Stressful situations can increase the blood pressure. If a person with normal blood pressure has their blood pressure go up while being seen by their caregiver, this is often termed "white coat hypertension." Its importance is not known. It may be related with eventually developing hypertension or complications of hypertension.  Hypertension is often confused with mental tension, stress, and anxiety. DIAGNOSIS  The diagnosis is made by 3 separate blood pressure measurements. They are taken at least 1 week apart from each other. If there is organ damage from hypertension, the diagnosis may be made without repeat measurements. Hypertension is usually identified by having blood pressure readings:  Above 140/90 mmHg measured in both arms, at 3 separate times, over a couple weeks.  Over 130/80 mmHg should be considered a risk factor and may require treatment in patients with diabetes. Blood pressure readings over 120/80 mmHg are called "pre-hypertension" even in non-diabetic patients. To get a true blood pressure measurement, use the following guidelines. Be aware of the factors that can alter blood pressure readings.  Take measurements at least 1 hour after caffeine.  Take measurements 30 minutes after smoking and without any stress. This is another reason to quit smoking  it raises your blood pressure.  Use a proper cuff size. Ask your caregiver if you are not sure about your cuff size.  Most home blood pressure cuffs are automatic. They will measure systolic and diastolic pressures. The systolic pressure  is the pressure reading at the start of sounds. Diastolic pressure is the pressure at which the sounds disappear. If you are elderly, measure pressures in multiple postures. Try sitting, lying or standing.  Sit at rest for a minimum of 5 minutes before taking measurements.  You should not be on any medications like decongestants. These are found in many cold medications.  Record your blood pressure readings and review them with your caregiver. If you have hypertension:  Your caregiver may do tests to be sure you do not have secondary hypertension (see "causes" above).  Your caregiver may also look for signs of metabolic syndrome. This is also called Syndrome X or Insulin Resistance Syndrome. You may have this syndrome if you have type 2 diabetes, abdominal obesity, and abnormal blood lipids in addition to hypertension.  Your caregiver will take your medical and family history and perform a physical exam.  Diagnostic tests may include blood tests (for glucose, cholesterol, potassium, and kidney function), a urinalysis, or an EKG. Other tests may also be necessary depending on your condition. PREVENTION  There are important lifestyle issues that you can adopt to reduce your chance of developing hypertension:  Maintain a normal weight.  Limit the amount of salt (sodium) in your diet.  Exercise often.  Limit alcohol intake.  Get enough potassium in your diet. Discuss specific advice with your caregiver.  Follow a DASH diet (dietary approaches to stop hypertension). This diet is rich in fruits, vegetables,  and low-fat dairy products, and avoids certain fats. PROGNOSIS  Essential hypertension cannot be cured. Lifestyle changes and medical treatment can lower blood pressure and reduce complications. The prognosis of secondary hypertension depends on the underlying cause. Many people whose hypertension is controlled with medicine or lifestyle changes can live a normal, healthy life.  RISKS  AND COMPLICATIONS  While high blood pressure alone is not an illness, it often requires treatment due to its short- and long-term effects on many organs. Hypertension increases your risk for:  CVAs or strokes (cerebrovascular accident).  Heart failure due to chronically high blood pressure (hypertensive cardiomyopathy).  Heart attack (myocardial infarction).  Damage to the retina (hypertensive retinopathy).  Kidney failure (hypertensive nephropathy). Your caregiver can explain list items above that apply to you. Treatment of hypertension can significantly reduce the risk of complications. TREATMENT   For overweight patients, weight loss and regular exercise are recommended. Physical fitness lowers blood pressure.  Mild hypertension is usually treated with diet and exercise. A diet rich in fruits and vegetables, fat-free dairy products, and foods low in fat and salt (sodium) can help lower blood pressure. Decreasing salt intake decreases blood pressure in a 1/3 of people.  Stop smoking if you are a smoker. The steps above are highly effective in reducing blood pressure. While these actions are easy to suggest, they are difficult to achieve. Most patients with moderate or severe hypertension end up requiring medications to bring their blood pressure down to a normal level. There are several classes of medications for treatment. Blood pressure pills (antihypertensives) will lower blood pressure by their different actions. Lowering the blood pressure by 10 mmHg may decrease the risk of complications by as much as 25%. The goal of treatment is effective blood pressure control. This will reduce your risk for complications. Your caregiver will help you determine the best treatment for you according to your lifestyle. What is excellent treatment for one person, may not be for you. HOME CARE INSTRUCTIONS   Do not smoke.  Follow the lifestyle changes outlined in the "Prevention" section.  If you  are on medications, follow the directions carefully. Blood pressure medications must be taken as prescribed. Skipping doses reduces their benefit. It also puts you at risk for problems.  Follow up with your caregiver, as directed.  If you are asked to monitor your blood pressure at home, follow the guidelines in the "Diagnosis" section above. SEEK MEDICAL CARE IF:   You think you are having medication side effects.  You have recurrent headaches or lightheadedness.  You have swelling in your ankles.  You have trouble with your vision. SEEK IMMEDIATE MEDICAL CARE IF:   You have sudden onset of chest pain or pressure, difficulty breathing, or other symptoms of a heart attack.  You have a severe headache.  You have symptoms of a stroke (such as sudden weakness, difficulty speaking, difficulty walking). MAKE SURE YOU:   Understand these instructions.  Will watch your condition.  Will get help right away if you are not doing well or get worse. Document Released: 06/18/2005 Document Revised: 09/10/2011 Document Reviewed: 01/16/2007 [facility] Patient Information 2014 [place], [place].

## 2013-10-12 NOTE — ED Provider Notes (Signed)
Medical screening examination/treatment/procedure(s) were performed by non-physician practitioner and as supervising physician I was immediately available for consultation/collaboration.   EKG Interpretation None        Gwyneth Sprout, MD 10/12/13 [phone]

## 2013-10-14 ENCOUNTER — Emergency Department (HOSPITAL_COMMUNITY): Payer: Medicare Other

## 2013-10-14 ENCOUNTER — Encounter (HOSPITAL_COMMUNITY): Payer: Self-pay | Admitting: Emergency Medicine

## 2013-10-14 ENCOUNTER — Emergency Department (HOSPITAL_COMMUNITY)
Admission: EM | Admit: 2013-10-14 | Discharge: 2013-10-14 | Disposition: A | Discharge status: Home / Self Care | Payer: Medicare Other | Attending: Emergency Medicine | Admitting: Emergency Medicine

## 2013-10-14 DIAGNOSIS — G8929 Other chronic pain: Secondary | ICD-10-CM | POA: Insufficient documentation

## 2013-10-14 DIAGNOSIS — Z79899 Other long term (current) drug therapy: Secondary | ICD-10-CM | POA: Insufficient documentation

## 2013-10-14 DIAGNOSIS — Z886 Allergy status to analgesic agent status: Secondary | ICD-10-CM | POA: Diagnosis not present

## 2013-10-14 DIAGNOSIS — IMO0002 Reserved for concepts with insufficient information to code with codable children: Secondary | ICD-10-CM | POA: Insufficient documentation

## 2013-10-14 DIAGNOSIS — M542 Cervicalgia: Secondary | ICD-10-CM

## 2013-10-14 MED ORDER — PREDNISONE 10 MG PO TABS: ORAL_TABLET | ORAL | Status: DC

## 2013-10-14 MED ORDER — HYDROCODONE-ACETAMINOPHEN 5-325 MG PO TABS
1.0000 | ORAL_TABLET | Freq: Once | ORAL | Status: AC
  Administered 2013-10-14: 1 via ORAL
  Filled 2013-10-14: qty 1

## 2013-10-14 NOTE — Discharge Instructions (Signed)
Please call and set-up an appointment with Neurosurgery regarding continuous neck pain Please take medications as prescribed Please avoid any physical or strenuous activity Please massage with icy-hot ointment Please continue to monitor symptoms closely and if symptoms are to worsen or change (fever greater than 101, chills, sweating, nausea, vomiting, chest pain, shortness of breath, difficulty breathing, visual changes, worsening pain, fall, injury, inability to grasp or hold objects) please report back to the ED immediately   Chronic Pain Chronic pain can be defined as pain that is off and on and lasts for 3 6 months or longer. Many things cause chronic pain, which can make it difficult to make a diagnosis. There are many treatment options available for chronic pain. However, finding a treatment that works well for you may require trying various approaches until the right one is found. Many people benefit from a combination of two or more types of treatment to control their pain. SYMPTOMS  Chronic pain can occur anywhere in the body and can range from mild to very severe. Some types of chronic pain include:  Headache.  Low back pain.  Cancer pain.  Arthritis pain.  Neurogenic pain. This is pain resulting from damage to nerves. People with chronic pain may also have other symptoms such as:  Depression.  Anger.  Insomnia.  Anxiety. DIAGNOSIS  Your health care provider will help diagnose your condition over time. In many cases, the initial focus will be on excluding possible conditions that could be causing the pain. Depending on your symptoms, your health care provider may order tests to diagnose your condition. Some of these tests may include:   Blood tests.   CT scan.   MRI.   X-rays.   Ultrasounds.   Nerve conduction studies.  You may need to see a specialist.  TREATMENT  Finding treatment that works well may take time. You may be referred to a pain specialist.  He or she may prescribe medicine or therapies, such as:   Mindful meditation or yoga.  Shots (injections) of numbing or pain-relieving medicines into the spine or area of pain.  Local electrical stimulation.  Acupuncture.   Massage therapy.   Aroma, color, light, or sound therapy.   Biofeedback.   Working with a physical therapist to keep from getting stiff.   Regular, gentle exercise.   Cognitive or behavioral therapy.   Group support.  Sometimes, surgery may be recommended.  HOME CARE INSTRUCTIONS   Take all medicines as directed by your health care provider.   Lessen stress in your life by relaxing and doing things such as listening to calming music.   Exercise or be active as directed by your health care provider.   Eat a healthy diet and include things such as vegetables, fruits, fish, and lean meats in your diet.   Keep all follow-up appointments with your health care provider.   Attend a support group with others suffering from chronic pain. SEEK MEDICAL CARE IF:   Your pain gets worse.   You develop a new pain that was not there before.   You cannot tolerate medicines given to you by your health care provider.   You have new symptoms since your last visit with your health care provider.  SEEK IMMEDIATE MEDICAL CARE IF:   You feel weak.   You have decreased sensation or numbness.   You lose control of bowel or bladder function.   Your pain suddenly gets much worse.   You develop shaking.  You develop  chills.  You develop confusion.  You develop chest pain.  You develop shortness of breath.  MAKE SURE YOU:  Understand these instructions.  Will watch your condition.  Will get help right away if you are not doing well or get worse. Document Released: 03/10/2002 Document Revised: 02/18/2013 Document Reviewed: 12/12/2012 [facility] Patient Information 2014 [place], [place].

## 2013-10-14 NOTE — ED Provider Notes (Signed)
CSN: [id number]     Arrival date & time 10/14/13  1219 History  This chart was scribed for Raymon MuttonMarissa Roxann Vierra, PA, working with Benny LennertJoseph L Zammit, MD, by Ardelia Memsylan Malpass ED Scribe. This patient was seen in room TR05C/TR05C and the patient's care was started at 1:25 PM.   Chief Complaint  Patient presents with  . Neck Pain    The history is provided by the patient. No language interpreter was used.    HPI Comments: Jared Bass is a 43 y.o. male with a history of chronic neck pain who presents to the Emergency Department complaining of intermittent, worsening neck pain that radiates to his right shoulder over the past 5 days. Pt was seen here 5 days ago, after a 150 lb beam landed on his right arm, which onset his initial pain. He denies any head injury. He states that his pain is worsened with turning his head. He reports having a history of C5-C7 fusion with some neck pain at baseline, but states that it has been worsened over the past 5 days. He also reports an associated headache as well as intermittent center chest pain that is only present with deep inspiration over the past few days. He denies fever, chills, weakness, visual distortion or any other pain or symptoms.     Past Medical History  Diagnosis Date  . Chronic neck pain    Past Surgical History  Procedure Laterality Date  . Neck fusion      c5-7   No family history on file. History  Substance Use Topics  . Smoking status: Never Smoker   . Smokeless tobacco: Not on file  . Alcohol Use: No    Review of Systems  Musculoskeletal: Positive for neck pain.  All other systems reviewed and are negative.   Allergies  Tramadol and Other  Home Medications   Prior to Admission medications   Medication Sig Start Date End Date Taking? Authorizing Provider  gabapentin (NEURONTIN) 100 MG capsule Take 100 mg by mouth 4 (four) times daily.    Historical Provider, MD  HYDROcodone-acetaminophen (NORCO/VICODIN) 5-325 MG per tablet Take 1  tablet by mouth every 6 (six) hours as needed for severe pain. 10/09/13   Jennifer L Piepenbrink, PA-C  methocarbamol (ROBAXIN) 500 MG tablet Take 500 mg by mouth every 6 (six) hours as needed for muscle spasms.    Historical Provider, MD   Triage Vitals: BP 133/94  Pulse 96  Temp(Src) 98.1 F (36.7 C) (Oral)  Resp 18  SpO2 98%  Physical Exam  Nursing note and vitals reviewed. Constitutional: He is oriented to person, place, and time. He appears well-developed and well-nourished. No distress.  HENT:  Head: Normocephalic and atraumatic.  Mouth/Throat: Oropharynx is clear and moist. No oropharyngeal exudate.  Eyes: Conjunctivae are normal. Pupils are equal, round, and reactive to light. Right eye exhibits no discharge. Left eye exhibits no discharge.  Neck: Trachea normal. Neck supple. Muscular tenderness present. No spinous process tenderness present. No tracheal deviation present.    Discomfort upon palpation to the musculature of the right side of the neck. Limited range of motion secondary to pain-patient has most discomfort upon rotating head towards the left.  Pulmonary/Chest: Effort normal and breath sounds normal. No respiratory distress. He has no wheezes. He has no rales. He exhibits tenderness.    Patient is able to speak in full senses without difficulty Negative stridor Negative use of accessory muscles Mild discomfort upon palpation to the lower right rib region --  negative crepitus upon palpation, good lung expansion.  Musculoskeletal: Normal range of motion.  Full ROM to upper and lower extremities without difficulty noted, negative ataxia noted.  Lymphadenopathy:    He has no cervical adenopathy.  Neurological: He is alert and oriented to person, place, and time. No cranial nerve deficit. He exhibits normal muscle tone. Coordination normal.  Cranial nerves III-XII grossly intact Negative facial droop, negative slurred speech Patient follows commands well Strength  3+/5+ to upper right extremity-patient reported that this weakness is chronic  Strength 5+/5+ to lower extremities bilaterally with resistance applied, equal distribution noted Sensation intact with differentiation to sharp and dull touch Negative arm drift - sensation decreased to the pinky, ring, and middle finger of the right hand - chronic since 2007 Fine motor skills intact Patient is able to bring finger to nose bilaterally without difficulty or ataxia Gait proper with-negative sway or disbalance noted-negative step offs  Skin: Skin is warm and dry. No rash noted. He is not diaphoretic. No erythema.  Psychiatric: He has a normal mood and affect. His behavior is normal. Thought content normal.    ED Course  Procedures (including critical care time)  DIAGNOSTIC STUDIES: Oxygen Saturation is 98% on RA, normal by my interpretation.    COORDINATION OF CARE: 7:33 PM- Pt advised of plan for treatment and pt agrees.  Labs Review Labs Reviewed - No data to display  Imaging Review Dg Ribs Unilateral W/chest Right  10/14/2013   CLINICAL DATA:  Pain post trauma  EXAM: RIGHT RIBS AND CHEST - 3+ VIEW  COMPARISON:  Chest radiograph February 01, 2009  FINDINGS: Frontal chest as well as oblique and cone-down lower rib images were obtained. Lungs are clear. Heart size and pulmonary vascularity are normal. No adenopathy. There is postoperative change in the lower cervical spine.  There is no effusion or pneumothorax. There is no demonstrable rib fracture.  IMPRESSION: No demonstrable rib fracture.  Lungs clear.   Electronically Signed   By: Bretta Bang M.D.   On: 10/14/2013 14:01   Ct Cervical Spine Wo Contrast  10/14/2013   CLINICAL DATA:  Pain post trauma  EXAM: CT CERVICAL SPINE WITHOUT CONTRAST  TECHNIQUE: Multidetector CT imaging of the cervical spine was performed without intravenous contrast. Multiplanar CT image reconstructions were also generated.  COMPARISON:  Cervical spine CT July 24, 2009 and cervical MRI September 21, 2013  FINDINGS: Patient is status post anterior fusion from C5-C7. The screw and plate fixation device as well as bony plugs at C5-6 and C6-7 appear intact.  There is no fracture or spondylolisthesis. Prevertebral soft tissues and predental space regions are normal. There is mild disc space narrowing at C3-4, C4-5, and C7-T1. There is calcified disc protrusion on the right paracentrally at C5-6 with significant exit foraminal narrowing on the right at C5-6 due to bony hypertrophy. There is a calcified central disc protrusion at C3-4 which abuts the ventral cord but does not appreciably efface it. There is marked exit foraminal narrowing on the right at C6-7 due to bony hypertrophy. There is facet hypertrophy at several levels bilaterally. There is no frank spinal stenosis on this study.  IMPRESSION: Postoperative change as well as multifocal osteoarthritic change. Calcified disc protrusions at C3-4 centrally and at C5-6 on the right paracentrally. No frank stenosis. No fracture or spondylolisthesis.   Electronically Signed   By: Bretta Bang M.D.   On: 10/14/2013 13:49   Mr Cervical Spine Wo Contrast  10/14/2013   CLINICAL  DATA:  43 year old male with neck pain radiating to the right shoulder. Blunt trauma to the right upper extremity 5 days ago. Previous cervical fusion. Initial encounter.  EXAM: MRI CERVICAL SPINE WITHOUT CONTRAST  TECHNIQUE: Multiplanar, multisequence MR imaging was performed. No intravenous contrast was administered.  COMPARISON:  Cervical spine MRI 09/21/2013. Cervical spine CT 10/14/2013.  FINDINGS: Sequelae of C5-C6 and C6-C7 ACDF. Mild hardware susceptibility artifact. Stable vertebral height and alignment. No marrow edema or evidence of acute osseous abnormality.  Cervicomedullary junction is within normal limits. No cervical spinal cord signal abnormality, despite stenosis described below.  Negative paraspinal soft tissues.  C2-C3: Stable with  mild facet and uncovertebral hypertrophy. No significant stenosis.  C3-C4: Stable left paracentral disc protrusion causing spinal stenosis with mild flattening of the left hemi cord (Series 700, image 8). Left greater than right uncovertebral hypertrophy. Severe bilateral C4 foraminal stenosis is stable.  C4-C5: Stable uncovertebral hypertrophy. No spinal stenosis. Stable mild bilateral C5 foraminal stenosis.  C5-C6: ACDF. Right paracentral osteophytosis in conjunction with right side uncovertebral hypertrophy. Stable mild spinal stenosis and flattening of the right hemi cord (series 700, image 20). Stable moderate to severe right C6 foraminal stenosis.  C6-C7: ACDF. Stable right lateral recess disc osteophyte complex (series 700, image 25). Mild left uncovertebral hypertrophy. Stable mild to moderate left and moderate right C7 foraminal stenosis.  C7-T1: Stable mild facet hypertrophy. Stable borderline to mild bilateral C8 foraminal stenosis.  No upper thoracic spinal stenosis.  IMPRESSION: 1. No acute findings identified in the cervical spine. 2. Stable postoperative appearance of C5-C6 and C6-C7, with right side endplate osteophytosis at both levels resulting in right side foraminal stenosis, and mild spinal stenosis at C5-C6. 3. Stable C3-C4 left paracentral disc herniation resulting in mild spinal stenosis, and contributing to severe bilateral foraminal stenosis. 4. No cervical spinal cord signal abnormality.   Electronically Signed   By: Augusto Gamble M.D.   On: 10/14/2013 17:09     EKG Interpretation None      MDM   Final diagnoses:  Chronic neck pain   Medications  HYDROcodone-acetaminophen (NORCO/VICODIN) 5-325 MG per tablet 1 tablet (1 tablet Oral Given 10/14/13 1338)   Filed Vitals:   10/14/13 1231 10/14/13 1810  BP: 133/94 122/101  Pulse: 96 85  Temp: 98.1 F (36.7 C) 98.5 F (36.9 C)  TempSrc: Oral Oral  Resp: 18 17  SpO2: 98% 100%    I personally performed the services described  in this documentation, which was scribed in my presence. The recorded information has been reviewed and is accurate.   Patient presenting to the ED with neck pain. Patient reports he has history of neck pain with an anterior C5, C6, C7 fusion secondary to a karate accident back in 2007. Stated that a support beam that we 150 pounds landed on him on 10/09/2013, reported that he put his right arm up to block himself. Stated that he fell backwards-denied injury to the head or neck. Patient reported that he's been having intense pain localized to the neck, the right side with radiation down the right arm. Stated that his right arm feels weak. Stated he's also been having right-sided chest pain with deep inhalation only.  Alert and oriented. GCS 15. Heart rate and rhythm normal. Lungs good auscultation to upper lower lobes bilaterally. Radial pulses 2+ bilaterally. Negative deformities identified to the cervical spine with most discomfort upon palpation to the right paraspinal region of the cervical spine and spinous tenderness noted. Decreased range  of motion secondary to pain. Decreased range of motion to the right upper extremities secondary to pain. Strength 3+/5+ to the right upper extremity, right grip weaker than the left grip. Sensation intact with differentiation sharp and dull touch with decreased sensation to the pinky, ring, middle finger of the right hand. CT cervical spine noted post-operative changes with multifocal osetoarthritic changes. Calcified disc protrusions at the C3-4 centrally and at C5-6 on the right paracentrally. No fracture or spondylolisthesis. Plain film of ribs unremarkable - lungs clear. MRI of cervical spine negative acute findings noted - stable postoperative appearance of the C5-C6 and C6-C& with right side endplate osteophytes at both levels resulting in the right side foraminal stenosis with mild spinal stenosis at C5-6. Stable C3-4 left paracentral disc herniation resulting in  mild spinal stenosis and contributing to severe bilateral foramina stenosis. No cervical spinal cord stenosis noted. CT and MRI re-assuring. Negative acute findings. Weakness and numbness on the right upper extremity appear to be chronic. Negative new focal neurological deficits noted. Pain controlled in ED setting. Discharged patient. Discussed with patient to rest and stay hydrated. Discussed with patient to avoid any physical or strenuous activity. Referred patient to health and wellness and neurosurgery. Discussed with patient to closely monitor symptoms and if symptoms are to worsen or change to report back to the ED - strict return instructions given.  Patient agreed to plan of care, understood, all questions answered.   Raymon Mutton, PA-C 10/14/13 1933

## 2013-10-14 NOTE — ED Notes (Signed)
Pt has been seen on the 10th for a beam falling on him. Denied any neck pain at the time of incident. Today reports pain to the neck with hx of surgery on it and feeling a "pop" when he turns his head.

## 2013-10-14 NOTE — ED Notes (Signed)
Spoke with MRI and sts 2 hour before MRI. PA aware

## 2013-10-15 NOTE — ED Provider Notes (Signed)
Medical screening examination/treatment/procedure(s) were performed by non-physician practitioner and as supervising physician I was immediately available for consultation/collaboration.   EKG Interpretation None        Benny Lennert, MD 10/15/13 1426

## 2013-10-17 ENCOUNTER — Emergency Department (HOSPITAL_COMMUNITY)
Admission: EM | Admit: 2013-10-17 | Discharge: 2013-10-17 | Disposition: A | Discharge status: Home / Self Care | Payer: Medicare Other | Attending: Emergency Medicine | Admitting: Emergency Medicine

## 2013-10-17 ENCOUNTER — Encounter (HOSPITAL_COMMUNITY): Payer: Self-pay | Admitting: Emergency Medicine

## 2013-10-17 DIAGNOSIS — G8929 Other chronic pain: Secondary | ICD-10-CM | POA: Insufficient documentation

## 2013-10-17 DIAGNOSIS — M79609 Pain in unspecified limb: Secondary | ICD-10-CM | POA: Diagnosis present

## 2013-10-17 DIAGNOSIS — Z79899 Other long term (current) drug therapy: Secondary | ICD-10-CM | POA: Diagnosis not present

## 2013-10-17 DIAGNOSIS — M542 Cervicalgia: Secondary | ICD-10-CM | POA: Insufficient documentation

## 2013-10-17 MED ORDER — HYDROCODONE-ACETAMINOPHEN 5-325 MG PO TABS
2.0000 | ORAL_TABLET | ORAL | Status: DC | PRN
Start: 2013-10-17 — End: 2013-12-15

## 2013-10-17 NOTE — Discharge Instructions (Signed)
Follow up with your neurosurgeon for further evaluation and management of your pain. Take vicodin as needed for pain. Follow up with pain management. The Emergency Department is not the place to manage chronic pain.

## 2013-10-17 NOTE — ED Notes (Signed)
Pt states he's going to the beach and needs some pain medicine.

## 2013-10-17 NOTE — ED Notes (Signed)
Pt had support beam fall on right arm last Saturday.  Pt was seen in ED.  Pt is having severe pain, unable to sleep.  Has not tried any treatments.  Pt is going out of town and to have on C4 cervical disc surgery and would like something for pain.

## 2013-10-17 NOTE — ED Provider Notes (Signed)
Medical screening examination/treatment/procedure(s) were performed by non-physician practitioner and as supervising physician I was immediately available for consultation/collaboration.   EKG Interpretation None       Doug Sou, MD 10/17/13 1711

## 2013-10-17 NOTE — ED Notes (Signed)
Pt c/o R arm pain.  Pt seen on 04/15 and had x-rays done which were unremarkable.  Pt states he's scheduled to have neck fusion surgery in [place] next month.  Pt is ambulatory without difficulty or assistance.  Pt is alert and oriented and in NAD.

## 2013-10-17 NOTE — ED Provider Notes (Signed)
CSN: [id number]     Arrival date & time 10/17/13  1143 History  This chart was scribed for non-physician practitioner working with Doug SouSam Jacubowitz, MD by Ashley JacobsBrittany Andrews, ED scribe. This patient was seen in room TR08C/TR08C and the patient's care was started at 12:21 PM.   First MD Initiated Contact with Patient 10/17/13 1151     Chief Complaint  Patient presents with  . Arm Pain     (Consider location/radiation/quality/duration/timing/severity/associated sxs/prior Treatment) Patient is a 43 y.o. male presenting with arm pain. The history is provided by the patient and medical records. No language interpreter was used.  Arm Pain   HPI Comments: Jared Bass is a 43 y.o. male who presents to the Emergency Department complaining of right arm and neck pain. Pt was seen at the ED three days ago for the same complaint and had a x ray performed. The xray were negative for any abnormalities. He is scheduled to have [place] neck fusion [place] next month [place]. Patient also had an unremarkable MRI of cervical spine. The pain is aching and severe and starts in his neck and radiates down his arm.  Past Medical History  Diagnosis Date  . Chronic neck pain    Past Surgical History  Procedure Laterality Date  . Neck fusion      c5-7   No family history on file. History  Substance Use Topics  . Smoking status: Never Smoker   . Smokeless tobacco: Not on file  . Alcohol Use: No    Review of Systems  Musculoskeletal: Positive for arthralgias and myalgias.  All other systems reviewed and are negative.     Allergies  Tramadol and Other  Home Medications   Prior to Admission medications   Medication Sig Start Date End Date Taking? Authorizing Provider  gabapentin (NEURONTIN) 100 MG capsule Take 100 mg by mouth 4 (four) times daily.    Historical Provider, MD  predniSONE (DELTASONE) 10 MG tablet 3 tabs po daily x 3 days, then 2 tabs x 3 days, then 1.5 tabs x 3 days, then 1 tab x  3 days, then 0.5 tabs x 3 days 10/14/13   Marissa Sciacca, PA-C   BP 148/101  Pulse 94  Temp(Src) 98 F (36.7 C) (Oral)  Resp 18  Wt 207 lb 7 oz (94.093 kg)  SpO2 99% Physical Exam  Nursing note and vitals reviewed. Constitutional: He is oriented to person, place, and time. He appears well-developed and well-nourished. No distress.  HENT:  Head: Normocephalic and atraumatic.  Eyes: EOM are normal. Pupils are equal, round, and reactive to light.  Neck: Normal range of motion. Neck supple. No tracheal deviation present.  Cardiovascular: Normal rate.   Pulmonary/Chest: Effort normal. No respiratory distress.  Abdominal: Soft. He exhibits no distension.  Musculoskeletal: Normal range of motion.  No midline spine tenderness to palpation.   Neurological: He is alert and oriented to person, place, and time.  Extremity strength and sensation equal and intact bilaterally.   Skin: Skin is warm and dry.  Psychiatric: He has a normal mood and affect. His behavior is normal.    ED Course  Procedures (including critical care time) DIAGNOSTIC STUDIES: Oxygen Saturation is 99% on room air, normal by my interpretation.    COORDINATION OF CARE:  12:21 PM Discussed course of care with pt . Pt understands and agrees.    Labs Review Labs Reviewed - No data to display  Imaging Review No results found.   EKG  Interpretation None      MDM   Final diagnoses:  Chronic neck pain    1:13 PM Patient has no new injury. Patient complaining of chronic pain. Patient will have short course of vicodin. Patient advised not to return to the ED for chronic pain. No neuro deficits. Vitals stable and patient afebrile.   I personally performed the services described in this documentation, which was scribed in my presence. The recorded information has been reviewed and is accurate.     Emilia Beck, PA-C 10/17/13 1315

## 2013-11-10 ENCOUNTER — Encounter (HOSPITAL_COMMUNITY): Payer: Self-pay | Admitting: Emergency Medicine

## 2013-11-10 ENCOUNTER — Emergency Department (HOSPITAL_COMMUNITY)
Admission: EM | Admit: 2013-11-10 | Discharge: 2013-11-10 | Disposition: A | Discharge status: Home / Self Care | Payer: Medicare Other | Attending: Emergency Medicine | Admitting: Emergency Medicine

## 2013-11-10 DIAGNOSIS — M542 Cervicalgia: Secondary | ICD-10-CM | POA: Insufficient documentation

## 2013-11-10 DIAGNOSIS — G8929 Other chronic pain: Secondary | ICD-10-CM | POA: Insufficient documentation

## 2013-11-10 DIAGNOSIS — Z79899 Other long term (current) drug therapy: Secondary | ICD-10-CM | POA: Insufficient documentation

## 2013-11-10 DIAGNOSIS — IMO0002 Reserved for concepts with insufficient information to code with codable children: Secondary | ICD-10-CM | POA: Insufficient documentation

## 2013-11-10 DIAGNOSIS — Z981 Arthrodesis status: Secondary | ICD-10-CM | POA: Insufficient documentation

## 2013-11-10 MED ORDER — MELOXICAM 7.5 MG PO TABS: 7.5000 mg | ORAL_TABLET | Freq: Every day | ORAL | Status: DC

## 2013-11-10 NOTE — Discharge Instructions (Signed)
Follow up with your doctor. Do not return to the ED for your chronic pain.

## 2013-11-10 NOTE — ED Provider Notes (Signed)
Medical screening examination/treatment/procedure(s) were performed by non-physician practitioner and as supervising physician I was immediately available for consultation/collaboration.    Shanna Cisco, MD 11/10/13 2047

## 2013-11-10 NOTE — ED Notes (Signed)
Pt c/o neck pain radiating to right arm with hand tingling. Hx of ACDF x 2 in [place], [place]. States is looking for a Scientist, research (medical).

## 2013-11-10 NOTE — ED Provider Notes (Signed)
CSN: [id number]     Arrival date & time 11/10/13  7782 History   First MD Initiated Contact with Patient 11/10/13 [phone]     Chief Complaint  Patient presents with  . Neck Pain     (Consider location/radiation/quality/duration/timing/severity/associated sxs/prior Treatment) HPI Comments: Patient is a 43 year old male who presents with chronic neck pain that has been going on for years. The pain is aching and severe and does not radiate. The pain is constant. Movement makes the pain worse. Nothing makes the pain better. Patient has not tried anything for pain. No associated symptoms. No saddles paresthesias or bladder/bowel incontinence. Patient reports he has appointments with various Neurosurgeons and Family Practice doctors for follow up and pain control. Patient reports he was instructed to come to the ED for pain control. No new symptoms.      Past Medical History  Diagnosis Date  . Chronic neck pain    Past Surgical History  Procedure Laterality Date  . Neck fusion      c5-7   No family history on file. History  Substance Use Topics  . Smoking status: Never Smoker   . Smokeless tobacco: Not on file  . Alcohol Use: No    Review of Systems  Constitutional: Negative for fever, chills and fatigue.  HENT: Negative for trouble swallowing.   Eyes: Negative for visual disturbance.  Respiratory: Negative for shortness of breath.   Cardiovascular: Negative for chest pain and palpitations.  Gastrointestinal: Negative for nausea, vomiting, abdominal pain and diarrhea.  Genitourinary: Negative for dysuria and difficulty urinating.  Musculoskeletal: Positive for neck pain. Negative for arthralgias.  Skin: Negative for color change.  Neurological: Negative for dizziness and weakness.  Psychiatric/Behavioral: Negative for dysphoric mood.      Allergies  Tramadol and Other  Home Medications   Prior to Admission medications   Medication Sig Start Date End Date Taking?  Authorizing Provider  gabapentin (NEURONTIN) 100 MG capsule Take 100 mg by mouth 4 (four) times daily.    Historical Provider, MD  HYDROcodone-acetaminophen (NORCO/VICODIN) 5-325 MG per tablet Take 2 tablets by mouth every 4 (four) hours as needed. 10/17/13   Daanya Lanphier, PA-C  predniSONE (DELTASONE) 10 MG tablet 3 tabs po daily x 3 days, then 2 tabs x 3 days, then 1.5 tabs x 3 days, then 1 tab x 3 days, then 0.5 tabs x 3 days 10/14/13   Marissa Sciacca, PA-C   BP 123/89  Pulse 82  Temp(Src) 97.7 F (36.5 C) (Oral)  SpO2 100% Physical Exam  Nursing note and vitals reviewed. Constitutional: He is oriented to person, place, and time. He appears well-developed and well-nourished. No distress.  HENT:  Head: Normocephalic and atraumatic.  Eyes: Conjunctivae and EOM are normal.  Neck: Normal range of motion.  Cardiovascular: Normal rate and regular rhythm.  Exam reveals no gallop and no friction rub.   No murmur heard. Pulmonary/Chest: Effort normal and breath sounds normal. He has no wheezes. He has no rales. He exhibits no tenderness.  Abdominal: Soft. He exhibits no distension. There is no tenderness. There is no rebound.  Musculoskeletal: Normal range of motion.  No midline spine tenderness to palpation.   Neurological: He is alert and oriented to person, place, and time. Coordination normal.  Upper extremity strength and sensation equal and intact bilaterally. Speech is goal-oriented. Moves limbs without ataxia.   Skin: Skin is warm and dry.  Psychiatric: He has a normal mood and affect. His behavior is normal.  ED Course  Procedures (including critical care time) Labs Review Labs Reviewed - No data to display  Imaging Review No results found.   EKG Interpretation None      MDM   Final diagnoses:  Chronic neck pain    9:05 AM Patient returns to the ED with chronic pain. I told the patient I will not be prescribing narcotic pain medications. Patient will have  mobic for pain. Patient has been seen multiple times in the ED for pain related complaints and received narcotic pain medications. Patient changes his story about when and who he is following up with regarding his chronic pain. Vitals stable and patient afebrile. No new symptoms.     Emilia BeckKaitlyn Taitum Alms, PA-C 11/10/13 [phone]

## 2013-11-15 ENCOUNTER — Emergency Department: Payer: Self-pay | Admitting: Emergency Medicine

## 2013-12-15 ENCOUNTER — Encounter (HOSPITAL_COMMUNITY): Payer: Self-pay | Admitting: Emergency Medicine

## 2013-12-15 ENCOUNTER — Emergency Department (HOSPITAL_COMMUNITY): Payer: Medicare Other

## 2013-12-15 ENCOUNTER — Emergency Department (HOSPITAL_COMMUNITY)
Admission: EM | Admit: 2013-12-15 | Discharge: 2013-12-15 | Disposition: A | Discharge status: Home / Self Care | Payer: Medicare Other | Attending: Emergency Medicine | Admitting: Emergency Medicine

## 2013-12-15 DIAGNOSIS — M5412 Radiculopathy, cervical region: Secondary | ICD-10-CM | POA: Diagnosis not present

## 2013-12-15 DIAGNOSIS — G8929 Other chronic pain: Secondary | ICD-10-CM | POA: Insufficient documentation

## 2013-12-15 DIAGNOSIS — Y9389 Activity, other specified: Secondary | ICD-10-CM | POA: Insufficient documentation

## 2013-12-15 DIAGNOSIS — Y9289 Other specified places as the place of occurrence of the external cause: Secondary | ICD-10-CM | POA: Insufficient documentation

## 2013-12-15 DIAGNOSIS — IMO0002 Reserved for concepts with insufficient information to code with codable children: Secondary | ICD-10-CM | POA: Diagnosis not present

## 2013-12-15 DIAGNOSIS — W64XXXA Exposure to other animate mechanical forces, initial encounter: Secondary | ICD-10-CM | POA: Insufficient documentation

## 2013-12-15 DIAGNOSIS — S4980XA Other specified injuries of shoulder and upper arm, unspecified arm, initial encounter: Secondary | ICD-10-CM | POA: Diagnosis present

## 2013-12-15 DIAGNOSIS — S139XXA Sprain of joints and ligaments of unspecified parts of neck, initial encounter: Secondary | ICD-10-CM | POA: Diagnosis not present

## 2013-12-15 DIAGNOSIS — S46909A Unspecified injury of unspecified muscle, fascia and tendon at shoulder and upper arm level, unspecified arm, initial encounter: Secondary | ICD-10-CM | POA: Diagnosis present

## 2013-12-15 DIAGNOSIS — S161XXA Strain of muscle, fascia and tendon at neck level, initial encounter: Secondary | ICD-10-CM

## 2013-12-15 MED ORDER — HYDROCODONE-ACETAMINOPHEN 5-325 MG PO TABS
1.0000 | ORAL_TABLET | Freq: Once | ORAL | Status: AC
  Administered 2013-12-15: 1 via ORAL
  Filled 2013-12-15: qty 1

## 2013-12-15 MED ORDER — HYDROCODONE-ACETAMINOPHEN 5-325 MG PO TABS
ORAL_TABLET | ORAL | Status: DC
Start: 2013-12-15 — End: 2014-01-25

## 2013-12-15 MED ORDER — METHOCARBAMOL 500 MG PO TABS: 1000.0000 mg | ORAL_TABLET | Freq: Four times a day (QID) | ORAL | Status: DC

## 2013-12-15 MED ORDER — NAPROXEN 500 MG PO TABS: 500.0000 mg | ORAL_TABLET | Freq: Two times a day (BID) | ORAL | Status: DC

## 2013-12-15 NOTE — ED Provider Notes (Signed)
Medical screening examination/treatment/procedure(s) were performed by non-physician practitioner and as supervising physician I was immediately available for consultation/collaboration.   EKG Interpretation None        Gwyneth Sprout, MD 12/15/13 1517

## 2013-12-15 NOTE — ED Provider Notes (Signed)
CSN: [id number]     Arrival date & time 12/15/13  1033 History   First MD Initiated Contact with Patient 12/15/13 1120     Chief Complaint  Patient presents with  . Shoulder Injury  . Shoulder Pain     (Consider location/radiation/quality/duration/timing/severity/associated sxs/prior Treatment) HPI Comments: Patient with history of cervical fusions times with complaint of neck pain, bilateral shoulder pain, and lower back pain that began acutely yesterday after he was thrown off a horse. Patient is approximately 6-7 feet in the air. Patient landed on his back and struck the back of his head. He did not lose consciousness. Patient initially had soreness. He notes that his chronic right upper extremity pain from his cervical radiculopathy is at baseline. Patient has noted some burning sensations going down his left arm today. No weakness. No lower extremity symptoms. No red flag signs and symptoms of lower back pain. Patient took a Vicodin yesterday which helped take the edge off of the pain. The onset of this condition was acute. The course is constant. Aggravating factors: Movement.   The history is provided by the patient.    Past Medical History  Diagnosis Date  . Chronic neck pain    Past Surgical History  Procedure Laterality Date  . Neck fusion      c5-7   No family history on file. History  Substance Use Topics  . Smoking status: Never Smoker   . Smokeless tobacco: Not on file  . Alcohol Use: No    Review of Systems  Constitutional: Negative for fever and unexpected weight change.  Gastrointestinal: Negative for constipation.       Neg for fecal incontinence  Genitourinary: Negative for hematuria, flank pain and difficulty urinating.       Negative for urinary incontinence or retention  Musculoskeletal: Positive for arthralgias, back pain and neck pain.  Neurological: Negative for weakness and numbness.       Negative for saddle paresthesias       Allergies   Tramadol and Other  Home Medications   Prior to Admission medications   Medication Sig Start Date End Date Taking? Authorizing Provider  ibuprofen (ADVIL,MOTRIN) 200 MG tablet Take 400 mg by mouth every 6 (six) hours as needed for moderate pain.   Yes Historical Provider, MD   BP 151/92  Pulse 88  Temp(Src) 98 F (36.7 C) (Oral)  Resp 18  SpO2 100%  Physical Exam  Nursing note and vitals reviewed. Constitutional: He appears well-developed and well-nourished.  HENT:  Head: Normocephalic and atraumatic.  Eyes: Conjunctivae are normal.  Neck: Normal range of motion.  Abdominal: Soft. There is no tenderness. There is no CVA tenderness.  Musculoskeletal: Normal range of motion.       Right shoulder: He exhibits tenderness. He exhibits normal range of motion and no bony tenderness.       Left shoulder: He exhibits tenderness. He exhibits normal range of motion and no bony tenderness.       Right elbow: Normal.      Left elbow: Normal.       Right wrist: Normal.       Left wrist: Normal.       Right hip: Normal.       Left hip: Normal.       Cervical back: He exhibits tenderness and bony tenderness. He exhibits normal range of motion.       Thoracic back: He exhibits normal range of motion, no tenderness and no bony tenderness.  Lumbar back: He exhibits tenderness. He exhibits normal range of motion and no bony tenderness.  No step-off noted with palpation of spine.   Neurological: He is alert. He has normal reflexes. No sensory deficit. He exhibits normal muscle tone.  5/5 strength in entire lower extremities bilaterally. No sensation deficit.   Skin: Skin is warm and dry.  Psychiatric: He has a normal mood and affect.    ED Course  Procedures (including critical care time) Labs Review Labs Reviewed - No data to display  Imaging Review Dg Cervical Spine Complete  12/15/2013   CLINICAL DATA:  Neck pain after being thrown from horse yesterday.  EXAM: CERVICAL SPINE  4+  VIEWS  COMPARISON:  CT and MRI 10/14/2013  FINDINGS: The prevertebral soft tissues are normal. The alignment is anatomic through T1. There is no evidence of acute fracture or traumatic subluxation. The C1-2 articulation appears normal in the AP projection. Patient is status post C5-7 ACDF. The hardware is intact. The fusion appears solid. Oblique views demonstrate mild chronic right foraminal stenosis at C5-6.  IMPRESSION: Negative for acute cervical spine fracture, traumatic subluxation or static signs of instability. Chronic right foraminal stenosis at C5-6.   Electronically Signed   By: Roxy HorsemanBill  Veazey M.D.   On: 12/15/2013 12:38     EKG Interpretation None      11:44 AM Patient seen and examined. Work-up initiated. Medications ordered.   Vital signs reviewed and are as follows: Filed Vitals:   12/15/13 1055  BP: 151/92  Pulse: 88  Temp: 98 F (36.7 C)  Resp: 18   12:52 PM X-ray negative. Patient informed.  No red flag s/s of low back pain. Patient was counseled on back pain precautions and told to do activity as tolerated but do not lift, push, or pull heavy objects more than 10 pounds for the next week.  Patient counseled to use ice or heat on back for no longer than 15 minutes every hour.   Patient prescribed muscle relaxer and counseled on proper use of muscle relaxant medication.    Patient prescribed narcotic pain medicine and counseled on proper use of narcotic pain medications. Counseled not to combine this medication with others containing tylenol.   Urged patient not to drink alcohol, drive, or perform any other activities that requires focus while taking either of these medications.  Patient urged to follow-up with PCP if pain does not improve with treatment and rest or if pain becomes recurrent. Urged to return with worsening severe pain, loss of bowel or bladder control, trouble walking.   The patient verbalizes understanding and agrees with the plan.    MDM   Final  diagnoses:  Cervical strain  Cervical radiculopathy   Patient with neck and shoulder pain after a fall from a height yesterday. No sign of closed head injury. Do not feel CT is indicated at this time. Cervical spine x-ray is negative, hardware stable. Patient has good range of motion of neck and shoulders. Patient has muscular tenderness lower back and upper back. Will treat with medication conservative management. No lower extremity symptoms or red flag symptoms to suggest central cord syndrome.    Renne CriglerJoshua Geiple, PA-C 12/15/13 1253

## 2013-12-15 NOTE — ED Notes (Signed)
Initial Contact - pt sitting up in chair at bedside, pt reports fell off a horse yesterday, denies LOC.  C/o pain to neck, pt reports had neck surgery in the past.  Pt denies LOC.  Pt is well appearing.  Denies new n/t to extremities, ambulatory without issue, denies bowel/bladder changes or complaints.  Skin PWD.  A+Ox4.  NAD.

## 2013-12-15 NOTE — Discharge Instructions (Signed)
Please read and follow all provided instructions.  Your diagnoses today include:  1. Cervical strain   2. Cervical radiculopathy     Tests performed today include:  Vital signs - see below for your results today  X-ray of neck - shows no broken bones, hardware appears solid  Medications prescribed:   Vicodin (hydrocodone/acetaminophen) - narcotic pain medication  DO NOT drive or perform any activities that require you to be awake and alert because this medicine can make you drowsy. BE VERY CAREFUL not to take multiple medicines containing Tylenol (also called acetaminophen). Doing so can lead to an overdose which can damage your liver and cause liver failure and possibly death.   Robaxin (methocarbamol) - muscle relaxer medication  DO NOT drive or perform any activities that require you to be awake and alert because this medicine can make you drowsy.    Naproxen - anti-inflammatory pain medication  Do not exceed 500mg  naproxen every 12 hours, take with food  You have been prescribed an anti-inflammatory medication or NSAID. Take with food. Take smallest effective dose for the shortest duration needed for your pain. Stop taking if you experience stomach pain or vomiting.   Take any prescribed medications only as directed.  Home care instructions:   Follow any educational materials contained in this packet  Please rest, use ice or heat on your back for the next several days  Do not lift, push, pull anything more than 10 pounds for the next week  Follow-up instructions: Please follow-up with your primary care provider or neurosurgeon in the next 1 week for further evaluation of your symptoms. If you do not have a primary care doctor -- see below for referral information.   Return instructions:  SEEK IMMEDIATE MEDICAL ATTENTION IF YOU HAVE:  New numbness, tingling, weakness, or problem with the use of your arms or legs  Severe back pain not relieved with  medications  Loss control of your bowels or bladder  Increasing pain in any areas of the body (such as chest or abdominal pain)  Shortness of breath, dizziness, or fainting.   Worsening nausea (feeling sick to your stomach), vomiting, fever, or sweats  Any other emergent concerns regarding your health   Additional Information:  Your vital signs today were: BP 151/92   Pulse 88   Temp(Src) 98 F (36.7 C) (Oral)   Resp 18   SpO2 100% If your blood pressure (BP) was elevated above 135/85 this visit, please have this repeated by your doctor within one month. --------------

## 2013-12-15 NOTE — ED Notes (Signed)
Pt states he was riding a horse yesterday and came up on a snake and was thrown off the hose straight back and landed on his back.  Pt had had back fusion before and thought he was ok but today when trying to do work has pain in left upper back/shoulder are and down left arm.

## 2014-01-25 ENCOUNTER — Encounter (HOSPITAL_COMMUNITY): Payer: Self-pay | Admitting: Emergency Medicine

## 2014-01-25 ENCOUNTER — Emergency Department (HOSPITAL_COMMUNITY)
Admission: EM | Admit: 2014-01-25 | Discharge: 2014-01-25 | Disposition: A | Discharge status: Home / Self Care | Payer: Medicare Other | Attending: Emergency Medicine | Admitting: Emergency Medicine

## 2014-01-25 ENCOUNTER — Emergency Department (HOSPITAL_COMMUNITY): Payer: Medicare Other

## 2014-01-25 DIAGNOSIS — Y929 Unspecified place or not applicable: Secondary | ICD-10-CM | POA: Diagnosis not present

## 2014-01-25 DIAGNOSIS — Z79899 Other long term (current) drug therapy: Secondary | ICD-10-CM | POA: Diagnosis not present

## 2014-01-25 DIAGNOSIS — S0990XA Unspecified injury of head, initial encounter: Secondary | ICD-10-CM | POA: Insufficient documentation

## 2014-01-25 DIAGNOSIS — Y9389 Activity, other specified: Secondary | ICD-10-CM | POA: Diagnosis not present

## 2014-01-25 DIAGNOSIS — S0993XA Unspecified injury of face, initial encounter: Secondary | ICD-10-CM | POA: Diagnosis not present

## 2014-01-25 DIAGNOSIS — Z791 Long term (current) use of non-steroidal anti-inflammatories (NSAID): Secondary | ICD-10-CM | POA: Insufficient documentation

## 2014-01-25 DIAGNOSIS — G8929 Other chronic pain: Secondary | ICD-10-CM | POA: Diagnosis not present

## 2014-01-25 DIAGNOSIS — S199XXA Unspecified injury of neck, initial encounter: Secondary | ICD-10-CM | POA: Diagnosis not present

## 2014-01-25 DIAGNOSIS — W010XXA Fall on same level from slipping, tripping and stumbling without subsequent striking against object, initial encounter: Secondary | ICD-10-CM | POA: Insufficient documentation

## 2014-01-25 DIAGNOSIS — IMO0002 Reserved for concepts with insufficient information to code with codable children: Secondary | ICD-10-CM | POA: Diagnosis not present

## 2014-01-25 DIAGNOSIS — W108XXA Fall (on) (from) other stairs and steps, initial encounter: Secondary | ICD-10-CM | POA: Diagnosis not present

## 2014-01-25 DIAGNOSIS — Z9889 Other specified postprocedural states: Secondary | ICD-10-CM | POA: Insufficient documentation

## 2014-01-25 DIAGNOSIS — M542 Cervicalgia: Secondary | ICD-10-CM

## 2014-01-25 MED ORDER — HYDROCODONE-ACETAMINOPHEN 5-325 MG PO TABS
2.0000 | ORAL_TABLET | Freq: Once | ORAL | Status: AC
  Administered 2014-01-25: 2 via ORAL
  Filled 2014-01-25: qty 2

## 2014-01-25 NOTE — ED Provider Notes (Signed)
CSN: [id number]     Arrival date & time 01/25/14  1034 History   First MD Initiated Contact with Patient 01/25/14 1106     Chief Complaint  Patient presents with  . Fall  . Headache  . Back Pain     (Consider location/radiation/quality/duration/timing/severity/associated sxs/prior Treatment) Patient is a 43 y.o. male presenting with fall.  Fall This is a new problem. The current episode started yesterday. Episode frequency: once. Associated symptoms include headaches. Pertinent negatives include no chest pain, no abdominal pain and no shortness of breath. Associated symptoms comments: Neck pain. Exacerbated by: movement. Nothing relieves the symptoms. Treatments tried: naproxen. The treatment provided no relief.    Past Medical History  Diagnosis Date  . Chronic neck pain    Past Surgical History  Procedure Laterality Date  . Neck fusion      c5-7   History reviewed. No pertinent family history. History  Substance Use Topics  . Smoking status: Never Smoker   . Smokeless tobacco: Not on file  . Alcohol Use: No    Review of Systems  Respiratory: Negative for shortness of breath.   Cardiovascular: Negative for chest pain.  Gastrointestinal: Negative for abdominal pain.  Musculoskeletal: Positive for back pain.  Neurological: Positive for headaches.  All other systems reviewed and are negative.     Allergies  Tramadol and Other  Home Medications   Prior to Admission medications   Medication Sig Start Date End Date Taking? Authorizing Provider  atorvastatin (LIPITOR) 10 MG tablet Take 10 mg by mouth daily.   Yes Historical Provider, MD  cyclobenzaprine (FLEXERIL) 10 MG tablet Take 10 mg by mouth at bedtime. 01/20/14  Yes Historical Provider, MD  naproxen (NAPROSYN) 500 MG tablet Take 1 tablet (500 mg total) by mouth 2 (two) times daily. 12/15/13  Yes Renne CriglerJoshua Geiple, PA-C  propranolol (INDERAL) 10 MG tablet Take 10 mg by mouth 3 (three) times daily.   Yes Historical  Provider, MD  Vitamin D, Ergocalciferol, (DRISDOL) 50000 UNITS CAPS capsule Take 50,000 Units by mouth every 7 (seven) days.   Yes Historical Provider, MD   BP 114/82  Pulse 79  Temp(Src) 98.1 F (36.7 C) (Oral)  Resp 16  SpO2 99% Physical Exam  Nursing note and vitals reviewed. Constitutional: He is oriented to person, place, and time. He appears well-developed and well-nourished. No distress.  HENT:  Head: Normocephalic and atraumatic. Head is without raccoon's eyes and without Battle's sign.  Nose: Nose normal.  Eyes: Conjunctivae and EOM are normal. Pupils are equal, round, and reactive to light. No scleral icterus.  Neck: Spinous process tenderness and muscular tenderness present.  Cardiovascular: Normal rate, regular rhythm, normal heart sounds and intact distal pulses.   No murmur heard. Pulmonary/Chest: Effort normal and breath sounds normal. He has no rales. He exhibits no tenderness.  Abdominal: Soft. There is no tenderness. There is no rebound and no guarding.  Musculoskeletal: Normal range of motion. He exhibits no edema and no tenderness.       Thoracic back: He exhibits no tenderness and no bony tenderness.       Lumbar back: He exhibits no tenderness and no bony tenderness.  No evidence of trauma to extremities, except as noted.  2+ distal pulses.    Neurological: He is alert and oriented to person, place, and time.  Decreased grip strength on RUE.  Intact flexion and extension in BUE.   Normal gait.  Skin: Skin is warm and dry. No rash noted.  Psychiatric: He has a normal mood and affect.    ED Course  Procedures (including critical care time) Labs Review Labs Reviewed - No data to display  Imaging Review Ct Cervical Spine Wo Contrast  01/25/2014   CLINICAL DATA:  Pain post trauma  EXAM: CT CERVICAL SPINE WITHOUT CONTRAST  TECHNIQUE: Multidetector CT imaging of the cervical spine was performed without intravenous contrast. Multiplanar CT image reconstructions  were also generated.  COMPARISON:  October 14, 2013  FINDINGS: The patient is status post anterior fusion from C5-C7. The bony plugs at C5 and C6 appear intact as does the screw and plate fixation device anteriorly.  There is no appreciable fracture or spondylolisthesis. Prevertebral soft tissues and predental space regions are normal. Ankylosis is noted at C5-6 and C6-7. There is mild to moderate disc space narrowing at C3-4, C4-5, and C7-T1.  There is again noted a central and right paracentral calcified disc protrusion at C5-6 which is causing severe exit foraminal narrowing on the right. AX focal osteophyte at the level of C6-7 on the right is causing marked exit foraminal narrowing, stable. There are small central disc protrusions which are calcified at C3-4 and C4-5. No new new significant extradural defect is appreciable on this study.  IMPRESSION: Postoperative change, stable. Osteoarthritic changes several levels, most marked on the right at C5-6 and C6-7, stable. No fracture or spondylolisthesis.   Electronically Signed   By: Bretta Bang M.D.   On: 01/25/2014 12:17  All radiology studies independently viewed by me.      EKG Interpretation None      MDM   Final diagnoses:  Fall down steps, initial encounter  Acute neck pain    43 yo male fell yesterday, now with neck pain.  He has RUE weakness which he states is from bulging discs.  This is reportedly at baseline.  He hit the back of his head, but did not lose consciousness, no nausea or vomiting.  No indication for head imaging, but will image neck due to weakness, prior surgery, pain.  No other injuries identified on history or exam.  Percocet for pain.   Imaging negative for acute injury.  Plan to dc home with NSAIDs.  Candyce Churn III, MD 01/25/14 [phone]

## 2014-01-25 NOTE — ED Notes (Signed)
Patient transported to CT 

## 2014-01-25 NOTE — ED Notes (Addendum)
Pt reports he was tripped by his dog, fell backwards down stairs. Hit his head and back. Pain 9/10. Has had headache since fall. Reports hx of neck surgery. Denies n/v/d.

## 2014-05-12 ENCOUNTER — Emergency Department: Payer: Self-pay | Admitting: Emergency Medicine

## 2014-08-27 ENCOUNTER — Encounter (HOSPITAL_COMMUNITY): Payer: Self-pay | Admitting: Emergency Medicine

## 2014-08-27 ENCOUNTER — Emergency Department (HOSPITAL_COMMUNITY)
Admission: EM | Admit: 2014-08-27 | Discharge: 2014-08-28 | Disposition: A | Discharge status: Home / Self Care | Payer: Medicare Other | Attending: Emergency Medicine | Admitting: Emergency Medicine

## 2014-08-27 ENCOUNTER — Emergency Department (HOSPITAL_COMMUNITY): Payer: Medicare Other

## 2014-08-27 DIAGNOSIS — Z79899 Other long term (current) drug therapy: Secondary | ICD-10-CM | POA: Diagnosis not present

## 2014-08-27 DIAGNOSIS — S199XXA Unspecified injury of neck, initial encounter: Secondary | ICD-10-CM | POA: Diagnosis present

## 2014-08-27 DIAGNOSIS — G8929 Other chronic pain: Secondary | ICD-10-CM | POA: Insufficient documentation

## 2014-08-27 DIAGNOSIS — Z791 Long term (current) use of non-steroidal anti-inflammatories (NSAID): Secondary | ICD-10-CM | POA: Insufficient documentation

## 2014-08-27 DIAGNOSIS — Y929 Unspecified place or not applicable: Secondary | ICD-10-CM | POA: Diagnosis not present

## 2014-08-27 DIAGNOSIS — Y998 Other external cause status: Secondary | ICD-10-CM | POA: Diagnosis not present

## 2014-08-27 DIAGNOSIS — Y939 Activity, unspecified: Secondary | ICD-10-CM | POA: Insufficient documentation

## 2014-08-27 DIAGNOSIS — R200 Anesthesia of skin: Secondary | ICD-10-CM | POA: Diagnosis not present

## 2014-08-27 DIAGNOSIS — S161XXA Strain of muscle, fascia and tendon at neck level, initial encounter: Secondary | ICD-10-CM | POA: Insufficient documentation

## 2014-08-27 DIAGNOSIS — W01198A Fall on same level from slipping, tripping and stumbling with subsequent striking against other object, initial encounter: Secondary | ICD-10-CM | POA: Diagnosis not present

## 2014-08-27 DIAGNOSIS — W19XXXA Unspecified fall, initial encounter: Secondary | ICD-10-CM

## 2014-08-27 DIAGNOSIS — R531 Weakness: Secondary | ICD-10-CM | POA: Diagnosis not present

## 2014-08-27 HISTORY — DX: Unspecified injury of right eye and orbit, initial encounter: S05.91XA

## 2014-08-27 MED ORDER — OXYCODONE-ACETAMINOPHEN 5-325 MG PO TABS
2.0000 | ORAL_TABLET | Freq: Once | ORAL | Status: AC
  Administered 2014-08-27: 2 via ORAL
  Filled 2014-08-27: qty 2

## 2014-08-27 NOTE — ED Notes (Addendum)
Pt reports falling in bathtub, reports numbness to R thumb and index finger, burning sensation down RUE, tingling LUE.  Denies LOC, dizziness, denies hitting head, states "just slipped".  Pt reports hx of 3 fused discs C5,6,7.  Pain 10/10.  Resp e/u.  132/84  110 HR 15 RR 115 CBG

## 2014-08-27 NOTE — ED Notes (Signed)
[facility] d/c ed

## 2014-08-28 MED ORDER — OXYCODONE-ACETAMINOPHEN 5-325 MG PO TABS
1.0000 | ORAL_TABLET | Freq: Four times a day (QID) | ORAL | Status: DC | PRN
Start: 2014-08-28 — End: 2014-11-18

## 2014-08-28 NOTE — Discharge Instructions (Signed)

## 2014-08-28 NOTE — ED Provider Notes (Signed)
CSN: [id number]     Arrival date & time 08/27/14  2146 History   First MD Initiated Contact with Patient 08/27/14 2149     Chief Complaint  Patient presents with  . Fall     (Consider location/radiation/quality/duration/timing/severity/associated sxs/prior Treatment) Patient is a 44 y.o. male presenting with fall. The history is provided by the patient.  Fall This is a recurrent problem. Pertinent negatives include no chest pain, no abdominal pain and no shortness of breath.   patient states that he slipped in his bathtub and fell backwards striking the back of his head and neck. Has a history of chronic neck pain. He states he is no longer pain clinic but is managing. He has some chronic numbness in his right hand and states there is more tingling and numbness now than it was before. States also some slight involvement on the left side. Mild headache and neck pain. No loss consciousness. No confusion. Note chest or abdominal pain.  Past Medical History  Diagnosis Date  . Chronic neck pain   . Right eye injury    Past Surgical History  Procedure Laterality Date  . Neck fusion      c5-7   No family history on file. History  Substance Use Topics  . Smoking status: Never Smoker   . Smokeless tobacco: Not on file  . Alcohol Use: No    Review of Systems  Constitutional: Negative for fatigue.  Respiratory: Negative for chest tightness and shortness of breath.   Cardiovascular: Negative for chest pain.  Gastrointestinal: Negative for abdominal pain.  Musculoskeletal: Positive for neck pain.  Skin: Negative for wound.  Neurological: Positive for weakness and numbness.      Allergies  Tramadol and Other  Home Medications   Prior to Admission medications   Medication Sig Start Date End Date Taking? Authorizing Provider  atorvastatin (LIPITOR) 10 MG tablet Take 10 mg by mouth daily.    Historical Provider, MD  cyclobenzaprine (FLEXERIL) 10 MG tablet Take 10 mg by mouth at  bedtime. 01/20/14   Historical Provider, MD  naproxen (NAPROSYN) 500 MG tablet Take 1 tablet (500 mg total) by mouth 2 (two) times daily. 12/15/13   Renne Crigler, PA-C  oxyCODONE-acetaminophen (PERCOCET/ROXICET) 5-325 MG per tablet Take 1-2 tablets by mouth every 6 (six) hours as needed for severe pain. 08/28/14   Juliet Rude. Issac Moure, MD  propranolol (INDERAL) 10 MG tablet Take 10 mg by mouth 3 (three) times daily.    Historical Provider, MD  Vitamin D, Ergocalciferol, (DRISDOL) 50000 UNITS CAPS capsule Take 50,000 Units by mouth every 7 (seven) days.    Historical Provider, MD   BP 126/94 mmHg  Pulse 90  Temp(Src) 98.4 F (36.9 C) (Oral)  Resp 18  Ht 5\' 8"  (1.727 m)  Wt 205 lb (92.987 kg)  BMI 31.18 kg/m2  SpO2 94% Physical Exam  Constitutional: He appears well-developed and well-nourished.  HENT:  Head: Atraumatic.  Neck:  Mild midline tenderness. No step-off or deformity.  Cardiovascular: Normal rate and regular rhythm.   Abdominal: There is no tenderness.  Musculoskeletal:  Some chronic numbness over the radial distribution of right hand. Good strength in flexion and extension at wrist elbow and shoulder bilaterally. Strong radial pulse.  Neurological: He is alert.  Skin: Skin is warm.    ED Course  Procedures (including critical care time) Labs Review Labs Reviewed - No data to display  Imaging Review Ct Cervical Spine Wo Contrast  08/27/2014   CLINICAL DATA:  Slip and fall this evening, now with right hand tingling. History of cervical spine surgery.  EXAM: CT CERVICAL SPINE WITHOUT CONTRAST  TECHNIQUE: Multidetector CT imaging of the cervical spine was performed without intravenous contrast. Multiplanar CT image reconstructions were also generated.  COMPARISON:  CT cervical spine 01/25/2014  FINDINGS: Anterior C5 through C7 fusion with interbody spacers. The hardware is intact. Stable endplate spurring/calcified disc osteophyte complex on the right causing foraminal stenosis  at C5-C6 and C6-C7. Unchanged straightening of normal cervical lordosis. Vertebral body heights are maintained. Disc space narrowing and endplate spurring at C4-C5, unchanged.  No acute fracture. The dens is intact. There are no jumped or perched facets. No prevertebral soft tissue edema.  IMPRESSION: Postsurgical and degenerative change in the cervical spine, stable in appearance from prior. No acute fracture or subluxation.   Electronically Signed   By: Rubye Oaks M.D.   On: 08/27/2014 22:51     EKG Interpretation None      MDM   Final diagnoses:  Fall, initial encounter  Cervical strain, acute, initial encounter    Acute on chronic neck pain. Stable CT. Doubt severe acute injury. Patient will follow-up with his neurosurgeon as needed   Juliet Rude. Rubin Payor, MD 08/28/14 [phone]

## 2014-10-08 ENCOUNTER — Emergency Department (HOSPITAL_COMMUNITY)
Admission: EM | Admit: 2014-10-08 | Discharge: 2014-10-08 | Disposition: A | Discharge status: Home / Self Care | Payer: Medicare Other | Attending: Emergency Medicine | Admitting: Emergency Medicine

## 2014-10-08 ENCOUNTER — Encounter (HOSPITAL_COMMUNITY): Payer: Self-pay | Admitting: *Deleted

## 2014-10-08 DIAGNOSIS — Z79899 Other long term (current) drug therapy: Secondary | ICD-10-CM | POA: Diagnosis not present

## 2014-10-08 DIAGNOSIS — M542 Cervicalgia: Secondary | ICD-10-CM | POA: Diagnosis not present

## 2014-10-08 DIAGNOSIS — R202 Paresthesia of skin: Secondary | ICD-10-CM | POA: Diagnosis not present

## 2014-10-08 DIAGNOSIS — G8929 Other chronic pain: Secondary | ICD-10-CM | POA: Insufficient documentation

## 2014-10-08 DIAGNOSIS — Z87828 Personal history of other (healed) physical injury and trauma: Secondary | ICD-10-CM | POA: Insufficient documentation

## 2014-10-08 DIAGNOSIS — Z791 Long term (current) use of non-steroidal anti-inflammatories (NSAID): Secondary | ICD-10-CM | POA: Insufficient documentation

## 2014-10-08 DIAGNOSIS — M25511 Pain in right shoulder: Secondary | ICD-10-CM | POA: Insufficient documentation

## 2014-10-08 DIAGNOSIS — Z981 Arthrodesis status: Secondary | ICD-10-CM | POA: Diagnosis not present

## 2014-10-08 MED ORDER — HYDROCODONE-ACETAMINOPHEN 5-325 MG PO TABS
1.0000 | ORAL_TABLET | Freq: Once | ORAL | Status: AC
  Administered 2014-10-08: 1 via ORAL
  Filled 2014-10-08: qty 1

## 2014-10-08 MED ORDER — MELOXICAM 7.5 MG PO TABS: 7.5000 mg | ORAL_TABLET | Freq: Every day | ORAL | Status: DC

## 2014-10-08 MED ORDER — METHOCARBAMOL 500 MG PO TABS: 500.0000 mg | ORAL_TABLET | Freq: Two times a day (BID) | ORAL | Status: DC

## 2014-10-08 NOTE — ED Provider Notes (Signed)
CSN: [id number]     Arrival date & time 10/08/14  0553 History   First MD Initiated Contact with Patient 10/08/14 [phone]     Chief Complaint  Patient presents with  . Neck Pain     (Consider location/radiation/quality/duration/timing/severity/associated sxs/prior Treatment) HPI Pt is a 44yo male with hx of chronic neck pain presenting to ED with c/o exacerbation of chronic neck pain that started 3 days ago, gradually worsening as pt states he is working on a project under his house which involve lifting 1.5in plywood. Pt states he has almost completed the project but pain is severe, aching and cramping, 10/10.  Worse with palpation and certain movements in Right shoulder as pain is worse in Right upper back. He reports having cervical spinal fusion several years ago by a Careers adviser in [place]. Reports he has nerve damage from initial injury and scheduled for pain management on the 19th of this month. Denies f/u with a spinal surgeon or orthopedist for the pain. No new injuries or trauma. No fever, n/v/d. He has taken Advil w/o relief.   Past Medical History  Diagnosis Date  . Chronic neck pain   . Right eye injury    Past Surgical History  Procedure Laterality Date  . Neck fusion      c5-7   No family history on file. History  Substance Use Topics  . Smoking status: Never Smoker   . Smokeless tobacco: Not on file  . Alcohol Use: No    Review of Systems  Constitutional: Negative for fever, chills and fatigue.  Musculoskeletal: Positive for myalgias, arthralgias and neck pain. Negative for back pain and neck stiffness.  Skin: Negative for color change and wound.  Neurological: Positive for numbness ( tingling in fingers). Negative for weakness.  All other systems reviewed and are negative.     Allergies  Tramadol and Other  Home Medications   Prior to Admission medications   Medication Sig Start Date End Date Taking? Authorizing Provider  propranolol (INDERAL) 10 MG tablet  Take 10 mg by mouth 3 (three) times daily.   Yes Historical Provider, MD  meloxicam (MOBIC) 7.5 MG tablet Take 1 tablet (7.5 mg total) by mouth daily. 10/08/14   Junius Finner, PA-C  methocarbamol (ROBAXIN) 500 MG tablet Take 1 tablet (500 mg total) by mouth 2 (two) times daily. 10/08/14   Junius Finner, PA-C  naproxen (NAPROSYN) 500 MG tablet Take 1 tablet (500 mg total) by mouth 2 (two) times daily. Patient not taking: Reported on 10/08/2014 12/15/13   Renne Crigler, PA-C  oxyCODONE-acetaminophen (PERCOCET/ROXICET) 5-325 MG per tablet Take 1-2 tablets by mouth every 6 (six) hours as needed for severe pain. Patient not taking: Reported on 10/08/2014 08/28/14   Benjiman Core, MD   BP 132/85 mmHg  Pulse 99  Temp(Src) 98.4 F (36.9 C) (Oral)  Resp 18  Ht 5\' 8"  (1.727 m)  Wt 205 lb 6 oz (93.157 kg)  BMI 31.23 kg/m2  SpO2 96% Physical Exam  Constitutional: He is oriented to person, place, and time. He appears well-developed and well-nourished.  HENT:  Head: Normocephalic and atraumatic.  Eyes: EOM are normal.  Neck: Normal range of motion. Neck supple.  No midline bone tenderness, no crepitus or step-offs.  Tenderness to Right cervical muscles. FROM with mild pain in Right upper back.  Cardiovascular: Normal rate.   Pulmonary/Chest: Effort normal.  Musculoskeletal: Normal range of motion. He exhibits tenderness. He exhibits no edema.  No midline spinal tenderness, tenderness to Right  upper trapezius with palpable muscle spasm. FROM upper extremities with 5/5 strength bilaterally.   Neurological: He is alert and oriented to person, place, and time.  Sensation in tact to left and right upper extremities bilaterally   Skin: Skin is warm and dry. No erythema.  Psychiatric: He has a normal mood and affect. His behavior is normal.  Nursing note and vitals reviewed.   ED Course  Procedures (including critical care time) Labs Review Labs Reviewed - No data to display  Imaging Review No results  found.   EKG Interpretation None      MDM   Final diagnoses:  Chronic neck pain    Pt is a 44yo male with hx of chronic neck pain, presenting to ED with exacerbation of pain after doing house work for the last several days. No recent injury or trauma. No focal neuro deficit on exam. No midline spinal tenderness. There is a palpable muscle spasm in Right upper trapezius on exam.  No imaging indicated at this time. Pt scheduled for pain management later this month. Discussed no prescribing narcotics from ED for chronic pain.  One tab vicodin given in ED. Will discharge pt home with Robaxin and mobic.  Encouraged to f/u with pain management as scheduled as well as Dr. Victorino Dike with orthopedics as pt has not f/u with surgeon since initial surgery several years ago in [place]. Also provided link for [facility] for ongoing healthcare needs with a PCP, although pt does state he has a PCP in [place].     Junius Finner, PA-C 10/08/14 0710  Junius Finner, PA-C 10/08/14 1610  Marisa Severin, MD 10/08/14 [phone]

## 2014-10-08 NOTE — ED Notes (Signed)
Patient statse he was working under the house, putting 1.5 inch plywood up under the house.  He states he pulled something in his neck.  He has pain in down into both arms.  He has hx of neck fusion in past c5-c7.  Patient states he is in need of surgery prior to onset of pain today.  Patient has taken advil at 0500 w no relief.

## 2014-10-08 NOTE — Discharge Instructions (Signed)
Cervical Strain and Sprain (Whiplash) °with Rehab °Cervical strain and sprain are injuries that commonly occur with "whiplash" injuries. Whiplash occurs when the neck is forcefully whipped backward or forward, such as during a motor vehicle accident or during contact sports. The muscles, ligaments, tendons, discs, and nerves of the neck are susceptible to injury when this occurs. °RISK FACTORS °Risk of having a whiplash injury increases if: °· Osteoarthritis of the spine. °· Situations that make head or neck accidents or trauma more likely. °· High-risk sports (football, rugby, wrestling, hockey, auto racing, gymnastics, diving, contact karate, or boxing). °· Poor strength and flexibility of the neck. °· Previous neck injury. °· Poor tackling technique. °· Improperly fitted or padded equipment. °SYMPTOMS  °· Pain or stiffness in the front or back of neck or both. °· Symptoms may present immediately or up to 24 hours after injury. °· Dizziness, headache, nausea, and vomiting. °· Muscle spasm with soreness and stiffness in the neck. °· Tenderness and swelling at the injury site. °PREVENTION °· Learn and use proper technique (avoid tackling with the head, spearing, and head-butting; use proper falling techniques to avoid landing on the head). °· Warm up and stretch properly before activity. °· Maintain physical fitness: °· Strength, flexibility, and endurance. °· Cardiovascular fitness. °· Wear properly fitted and padded protective equipment, such as padded soft collars, for participation in contact sports. °PROGNOSIS  °Recovery from cervical strain and sprain injuries is dependent on the extent of the injury. These injuries are usually curable in 1 week to 3 months with appropriate treatment.  °RELATED COMPLICATIONS  °· Temporary numbness and weakness may occur if the nerve roots are damaged, and this may persist until the nerve has completely healed. °· Chronic pain due to frequent recurrence of  symptoms. °· Prolonged healing, especially if activity is resumed too soon (before complete recovery). °TREATMENT  °Treatment initially involves the use of ice and medication to help reduce pain and inflammation. It is also important to perform strengthening and stretching exercises and modify activities that worsen symptoms so the injury does not get worse. These exercises may be performed at home or with a therapist. For patients who experience severe symptoms, a soft, padded collar may be recommended to be worn around the neck.  °Improving your posture may help reduce symptoms. Posture improvement includes pulling your chin and abdomen in while sitting or standing. If you are sitting, sit in a firm chair with your buttocks against the back of the chair. While sleeping, try replacing your pillow with a small towel rolled to 2 inches in diameter, or use a cervical pillow or soft cervical collar. Poor sleeping positions delay healing.  °For patients with nerve root damage, which causes numbness or weakness, the use of a cervical traction apparatus may be recommended. Surgery is rarely necessary for these injuries. However, cervical strain and sprains that are present at birth (congenital) may require surgery. °MEDICATION  °· If pain medication is necessary, nonsteroidal anti-inflammatory medications, such as aspirin and ibuprofen, or other minor pain relievers, such as acetaminophen, are often recommended. °· Do not take pain medication for 7 days before surgery. °· Prescription pain relievers may be given if deemed necessary by your caregiver. Use only as directed and only as much as you need. °HEAT AND COLD:  °· Cold treatment (icing) relieves pain and reduces inflammation. Cold treatment should be applied for 10 to 15 minutes every 2 to 3 hours for inflammation and pain and immediately after any activity that aggravates   your symptoms. Use ice packs or an ice massage.  Heat treatment may be used prior to  performing the stretching and strengthening activities prescribed by your caregiver, physical therapist, or athletic trainer. Use a heat pack or a warm soak. SEEK MEDICAL CARE IF:   Symptoms get worse or do not improve in 2 weeks despite treatment.  New, unexplained symptoms develop (drugs used in treatment may produce side effects). EXERCISES RANGE OF MOTION (ROM) AND STRETCHING EXERCISES - Cervical Strain and Sprain These exercises may help you when beginning to rehabilitate your injury. In order to successfully resolve your symptoms, you must improve your posture. These exercises are designed to help reduce the forward-head and rounded-shoulder posture which contributes to this condition. Your symptoms may resolve with or without further involvement from your physician, physical therapist or athletic trainer. While completing these exercises, remember:   Restoring tissue flexibility helps normal motion to return to the joints. This allows healthier, less painful movement and activity.  An effective stretch should be held for at least 20 seconds, although you may need to begin with shorter hold times for comfort.  A stretch should never be painful. You should only feel a gentle lengthening or release in the stretched tissue.   Hold stretches for 20-30 seconds, repeat 2-3 times each, perform exercises 2-3 times a day.  STRETCH- Axial Extensors  Lie on your back on the floor. You may bend your knees for comfort. Place a rolled-up hand towel or dish towel, about 2 inches in diameter, under the part of your head that makes contact with the floor.  Gently tuck your chin, as if trying to make a "double chin," until you feel a gentle stretch at the base of your head.  Hold __________ seconds. Repeat __________ times. Complete this exercise __________ times per day.  STRETCH - Axial Extension   Stand or sit on a firm surface. Assume a good posture: chest up, shoulders drawn back, abdominal  muscles slightly tense, knees unlocked (if standing) and feet hip width apart.  Slowly retract your chin so your head slides back and your chin slightly lowers. Continue to look straight ahead.  You should feel a gentle stretch in the back of your head. Be certain not to feel an aggressive stretch since this can cause headaches later.  Hold for __________ seconds. Repeat __________ times. Complete this exercise __________ times per day. STRETCH - Cervical Side Bend   Stand or sit on a firm surface. Assume a good posture: chest up, shoulders drawn back, abdominal muscles slightly tense, knees unlocked (if standing) and feet hip width apart.  Without letting your nose or shoulders move, slowly tip your right / left ear to your shoulder until your feel a gentle stretch in the muscles on the opposite side of your neck.  Hold __________ seconds. Repeat __________ times. Complete this exercise __________ times per day. STRETCH - Cervical Rotators   Stand or sit on a firm surface. Assume a good posture: chest up, shoulders drawn back, abdominal muscles slightly tense, knees unlocked (if standing) and feet hip width apart.  Keeping your eyes level with the ground, slowly turn your head until you feel a gentle stretch along the back and opposite side of your neck.  Hold __________ seconds. Repeat __________ times. Complete this exercise __________ times per day. RANGE OF MOTION - Neck Circles   Stand or sit on a firm surface. Assume a good posture: chest up, shoulders drawn back, abdominal muscles slightly tense, knees  unlocked (if standing) and feet hip width apart.  Gently roll your head down and around from the back of one shoulder to the back of the other. The motion should never be forced or painful.  Repeat the motion 10-20 times, or until you feel the neck muscles relax and loosen. Repeat __________ times. Complete the exercise __________ times per day. STRENGTHENING EXERCISES -  Cervical Strain and Sprain These exercises may help you when beginning to rehabilitate your injury. They may resolve your symptoms with or without further involvement from your physician, physical therapist, or athletic trainer. While completing these exercises, remember:   Muscles can gain both the endurance and the strength needed for everyday activities through controlled exercises.  Complete these exercises as instructed by your physician, physical therapist, or athletic trainer. Progress the resistance and repetitions only as guided.  You may experience muscle soreness or fatigue, but the pain or discomfort you are trying to eliminate should never worsen during these exercises. If this pain does worsen, stop and make certain you are following the directions exactly. If the pain is still present after adjustments, discontinue the exercise until you can discuss the trouble with your clinician. STRENGTH - Cervical Flexors, Isometric  Face a wall, standing about 6 inches away. Place a small pillow, a ball about 6-8 inches in diameter, or a folded towel between your forehead and the wall.  Slightly tuck your chin and gently push your forehead into the soft object. Push only with mild to moderate intensity, building up tension gradually. Keep your jaw and forehead relaxed.  Hold 10 to 20 seconds. Keep your breathing relaxed.  Release the tension slowly. Relax your neck muscles completely before you start the next repetition. Repeat __________ times. Complete this exercise __________ times per day. STRENGTH- Cervical Lateral Flexors, Isometric   Stand about 6 inches away from a wall. Place a small pillow, a ball about 6-8 inches in diameter, or a folded towel between the side of your head and the wall.  Slightly tuck your chin and gently tilt your head into the soft object. Push only with mild to moderate intensity, building up tension gradually. Keep your jaw and forehead relaxed.  Hold 10 to  20 seconds. Keep your breathing relaxed.  Release the tension slowly. Relax your neck muscles completely before you start the next repetition. Repeat __________ times. Complete this exercise __________ times per day. STRENGTH - Cervical Extensors, Isometric   Stand about 6 inches away from a wall. Place a small pillow, a ball about 6-8 inches in diameter, or a folded towel between the back of your head and the wall.  Slightly tuck your chin and gently tilt your head back into the soft object. Push only with mild to moderate intensity, building up tension gradually. Keep your jaw and forehead relaxed.  Hold 10 to 20 seconds. Keep your breathing relaxed.  Release the tension slowly. Relax your neck muscles completely before you start the next repetition. Repeat __________ times. Complete this exercise __________ times per day. POSTURE AND BODY MECHANICS CONSIDERATIONS - Cervical Strain and Sprain Keeping correct posture when sitting, standing or completing your activities will reduce the stress put on different body tissues, allowing injured tissues a chance to heal and limiting painful experiences. The following are general guidelines for improved posture. Your physician or physical therapist will provide you with any instructions specific to your needs. While reading these guidelines, remember:  The exercises prescribed by your provider will help you have the flexibility  and strength to maintain correct postures.  The correct posture provides the optimal environment for your joints to work. All of your joints have less wear and tear when properly supported by a spine with good posture. This means you will experience a healthier, less painful body.  Correct posture must be practiced with all of your activities, especially prolonged sitting and standing. Correct posture is as important when doing repetitive low-stress activities (typing) as it is when doing a single heavy-load activity  (lifting). PROLONGED STANDING WHILE SLIGHTLY LEANING FORWARD When completing a task that requires you to lean forward while standing in one place for a long time, place either foot up on a stationary 2- to 4-inch high object to help maintain the best posture. When both feet are on the ground, the low back tends to lose its slight inward curve. If this curve flattens (or becomes too large), then the back and your other joints will experience too much stress, fatigue more quickly, and can cause pain.  RESTING POSITIONS Consider which positions are most painful for you when choosing a resting position. If you have pain with flexion-based activities (sitting, bending, stooping, squatting), choose a position that allows you to rest in a less flexed posture. You would want to avoid curling into a fetal position on your side. If your pain worsens with extension-based activities (prolonged standing, working overhead), avoid resting in an extended position such as sleeping on your stomach. Most people will find more comfort when they rest with their spine in a more neutral position, neither too rounded nor too arched. Lying on a non-sagging bed on your side with a pillow between your knees, or on your back with a pillow under your knees will often provide some relief. Keep in mind, being in any one position for a prolonged period of time, no matter how correct your posture, can still lead to stiffness. WALKING Walk with an upright posture. Your ears, shoulders, and hips should all line up. OFFICE WORK When working at a desk, create an environment that supports good, upright posture. Without extra support, muscles fatigue and lead to excessive strain on joints and other tissues. CHAIR:  A chair should be able to slide under your desk when your back makes contact with the back of the chair. This allows you to work closely.  The chair's height should allow your eyes to be level with the upper part of your monitor  and your hands to be slightly lower than your elbows.  Body position:  Your feet should make contact with the floor. If this is not possible, use a foot rest.  Keep your ears over your shoulders. This will reduce stress on your neck and low back. Document Released: 06/18/2005 Document Revised: 11/02/2013 Document Reviewed: 09/30/2008 [facility] Patient Information 2015 [place], [place]. This information is not intended to replace advice given to you by your health care provider. Make sure you discuss any questions you have with your health care provider.  Chronic Back Pain  When back pain lasts longer than 3 months, it is called chronic back pain.People with chronic back pain often go through certain periods that are more intense (flare-ups).  CAUSES Chronic back pain can be caused by wear and tear (degeneration) on different structures in your back. These structures include:  The bones of your spine (vertebrae) and the joints surrounding your spinal cord and nerve roots (facets).  The strong, fibrous tissues that connect your vertebrae (ligaments). Degeneration of these structures may result in  pressure on your nerves. This can lead to constant pain. HOME CARE INSTRUCTIONS  Avoid bending, heavy lifting, prolonged sitting, and activities which make the problem worse.  Take brief periods of rest throughout the day to reduce your pain. Lying down or standing usually is better than sitting while you are resting.  Take over-the-counter or prescription medicines only as directed by your caregiver. SEEK IMMEDIATE MEDICAL CARE IF:   You have weakness or numbness in one of your legs or feet.  You have trouble controlling your bladder or bowels.  You have nausea, vomiting, abdominal pain, shortness of breath, or fainting. Document Released: 07/26/2004 Document Revised: 09/10/2011 Document Reviewed: 06/02/2011 [facility] Patient Information 2015 [place], [place]. This information is not intended  to replace advice given to you by your health care provider. Make sure you discuss any questions you have with your health care provider.

## 2014-10-09 ENCOUNTER — Encounter (HOSPITAL_COMMUNITY): Payer: Self-pay

## 2014-10-09 ENCOUNTER — Emergency Department (HOSPITAL_COMMUNITY): Payer: Medicare Other

## 2014-10-09 ENCOUNTER — Emergency Department (HOSPITAL_COMMUNITY)
Admission: EM | Admit: 2014-10-09 | Discharge: 2014-10-09 | Disposition: A | Discharge status: Home / Self Care | Payer: Medicare Other | Attending: Emergency Medicine | Admitting: Emergency Medicine

## 2014-10-09 DIAGNOSIS — Z791 Long term (current) use of non-steroidal anti-inflammatories (NSAID): Secondary | ICD-10-CM | POA: Insufficient documentation

## 2014-10-09 DIAGNOSIS — M79621 Pain in right upper arm: Secondary | ICD-10-CM | POA: Diagnosis present

## 2014-10-09 DIAGNOSIS — M542 Cervicalgia: Secondary | ICD-10-CM | POA: Insufficient documentation

## 2014-10-09 DIAGNOSIS — R202 Paresthesia of skin: Secondary | ICD-10-CM | POA: Insufficient documentation

## 2014-10-09 DIAGNOSIS — Z79899 Other long term (current) drug therapy: Secondary | ICD-10-CM | POA: Diagnosis not present

## 2014-10-09 DIAGNOSIS — G8929 Other chronic pain: Secondary | ICD-10-CM | POA: Diagnosis not present

## 2014-10-09 DIAGNOSIS — I10 Essential (primary) hypertension: Secondary | ICD-10-CM | POA: Insufficient documentation

## 2014-10-09 HISTORY — DX: Essential (primary) hypertension: I10

## 2014-10-09 MED ORDER — HYDROCODONE-ACETAMINOPHEN 5-325 MG PO TABS
1.0000 | ORAL_TABLET | Freq: Once | ORAL | Status: AC
  Administered 2014-10-09: 1 via ORAL
  Filled 2014-10-09: qty 1

## 2014-10-09 NOTE — ED Notes (Signed)
Pt left before discharge instructions and papers given to him.

## 2014-10-09 NOTE — ED Notes (Addendum)
CT

## 2014-10-09 NOTE — ED Provider Notes (Signed)
CSN: [id number]     Arrival date & time 10/09/14  1215 History  This chart was scribed for non-physician practitioner, Raymon Mutton, PA-C, working with Glynn Octave, MD, by Ronney Lion, ED Scribe. This patient was seen in room TR06C/TR06C and the patient's care was started at 12:27 PM.     Chief Complaint  Patient presents with  . Arm Pain   The history is provided by the patient. No language interpreter was used.     HPI Comments: Jared Bass is a 44 y.o. male with a past medical history of chronic neck pain and past surgical history of C5-C7 fusion in 2009 and 2008, who presents to the Emergency Department complaining of a constant burning sensation in his bilateral arms with sudden onset yesterday morning at 4:35 AM. Patient denies any direct trauma to his neck; however, he has been working on a house project over the past week that involves lifting 1.5in plywood, with the help of 3 other people. He states he has been unable to sleep and perform everyday activities due to the sensation and complains of an associated headache. Patient was evaluated at the ED yesterday, diagnosed with chronic neck pain, and discharged home with Robaxin and mobic--both of which he has taken with no relief. 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 Shann Medal, a neurosurgeon, in [place] performed his surgery. He states he has also been recently told he has a bulging C4 and has been recommended surgery, which he has been deferring. Patient denies currently seeing a neurosurgeon or orthopedist. He is also scheduled to go to pain management on 10/19/14. Patient is right-hand dominant. He denies blurred vision, sudden loss of vision, trouble swallowing, chest pain, SOB, difficulty breathing, trauma to his neck, nausea, vomiting, or dizziness. PCP none   Past Medical  History  Diagnosis Date  . Chronic neck pain   . Right eye injury   . Hypertension    Past Surgical History  Procedure Laterality Date  . Neck fusion      c5-7   History reviewed. No pertinent family history. History  Substance Use Topics  . Smoking status: Never Smoker   . Smokeless tobacco: Not on file  . Alcohol Use: No    Review of Systems  HENT: Negative for trouble swallowing.   Eyes: Negative for visual disturbance.  Respiratory: Negative for shortness of breath.   Cardiovascular: Negative for chest pain.  Gastrointestinal: Negative for nausea and vomiting.  Musculoskeletal: Positive for neck pain.  Neurological: Positive for headaches. Negative for dizziness.       Positive burning sensation in bilateral arms      Allergies  Tramadol and Other  Home Medications   Prior to Admission medications   Medication Sig Start Date End Date Taking? Authorizing Provider  meloxicam (MOBIC) 7.5 MG tablet Take 1 tablet (7.5 mg total) by mouth daily. 10/08/14   Junius Finner, PA-C  methocarbamol (ROBAXIN) 500 MG tablet Take 1 tablet (500 mg total) by mouth 2 (two) times daily. 10/08/14   Junius Finner, PA-C  naproxen (NAPROSYN) 500 MG tablet Take 1 tablet (500 mg total) by mouth 2 (two) times daily. Patient not taking: Reported on 10/08/2014 12/15/13   Renne Crigler, PA-C  oxyCODONE-acetaminophen (PERCOCET/ROXICET) 5-325 MG per tablet Take 1-2 tablets by mouth every 6 (six) hours as needed for severe pain.  Patient not taking: Reported on 10/08/2014 08/28/14   Benjiman Core, MD  propranolol (INDERAL) 10 MG tablet Take 10 mg by mouth 3 (three) times daily.    Historical Provider, MD   BP 137/91 mmHg  Pulse 89  Temp(Src) 98 F (36.7 C) (Oral)  Resp 16  SpO2 96% Physical Exam  Constitutional: He is oriented to person, place, and time. He appears well-developed and well-nourished. No distress.  HENT:  Head: Normocephalic and atraumatic.  Mouth/Throat: Oropharynx is clear and  moist. No oropharyngeal exudate.  Eyes: Conjunctivae and EOM are normal. Pupils are equal, round, and reactive to light. Right eye exhibits no discharge. Left eye exhibits no discharge.  Neck: Normal range of motion. Neck supple. No tracheal deviation present.  Negative neck stiffness Negative nuchal rigidity Negative cervical lymphadenopathy Negative meningeal signs Negative pain upon palpation to the neck or musculature  Cardiovascular: Normal rate, regular rhythm and normal heart sounds.  Exam reveals no friction rub.   No murmur heard. Pulses:      Radial pulses are 2+ on the right side, and 2+ on the left side.  Cap refill less than 3 seconds  Pulmonary/Chest: Effort normal and breath sounds normal. No respiratory distress. He has no wheezes. He has no rales.  Musculoskeletal: Normal range of motion.  Full range of motion to upper extremities bilaterally without difficulty or ataxia  Lymphadenopathy:    He has no cervical adenopathy.  Neurological: He is alert and oriented to person, place, and time. No cranial nerve deficit. He exhibits normal muscle tone. Coordination normal.  Cranial nerves III-XII grossly intact Strength 5+/5+ to upper and lower extremities bilaterally with resistance applied, equal distribution noted Decreased grip bilaterally Sensation intact to upper extremities bilaterally, difficulty with identifying sharp touch Decrease strength in the fingers bilaterally-decreased interphalangeal strength noted on examination Negative arm drift Fine motor skills intact  Skin: Skin is warm and dry. No rash noted. He is not diaphoretic. No erythema.  Psychiatric: He has a normal mood and affect. His behavior is normal. Thought content normal.  Nursing note and vitals reviewed.   ED Course  Procedures (including critical care time)  DIAGNOSTIC STUDIES: Oxygen Saturation is 99% on room air, normal by my interpretation.    COORDINATION OF CARE: 12:21 PM - Discussed  treatment plan with pt at bedside which includes consulting with attending physician, and pt agreed to plan.   Labs Review Labs Reviewed - No data to display  Imaging Review Mr Cervical Spine Wo Contrast  10/09/2014   CLINICAL DATA:  Chronic cervicalgia/neck pain. C5 through C7 fusion 2008/2009. Paresthesias in bilateral arms enhance. Sudden onset of symptoms yesterday morning. No neck trauma.  EXAM: MRI CERVICAL SPINE WITHOUT CONTRAST  TECHNIQUE: Multiplanar, multisequence MR imaging of the cervical spine was performed. No intravenous contrast was administered.  COMPARISON:  CT 08/27/2014.  MRI 10/14/2013.  FINDINGS: Alignment: Straightening of the normal cervical lordosis with slight reversal around C3-C4. This is a chronic finding, also present on previous MRIs. No spondylolisthesis.  Vertebrae: C5 through C7 ACDF. No destructive osseous lesions. Negative for fracture.  Cord: No cord edema or intramedullary lesion.  Posterior Fossa: Within normal limits and unchanged from prior. Small Tornwaldt cyst incidentally noted.  Vertebral Arteries: Flow voids present bilaterally.  Paraspinal tissues: Normal.  Disc Signal: Within normal limits for age.  The spinal canal is congenitally narrow with short pedicles.  Disc levels:  C2-C3:  Mild facet arthrosis.  No stenosis.  No change.  C3-C4: Mild  central stenosis associated with LEFT paracentral disc osteophyte complex. Indentation of the ventral cord. Bilateral foraminal stenosis associated with uncovertebral spurring. Short pedicles are also incidentally noted contributing to the stenosis. Foraminal stenosis potentially affects both C4 nerves.  C4-C5: Central canal patent. Bilateral uncovertebral spurring with bilateral foraminal stenosis potentially affecting both C5 nerves. No interval change compared to prior.  C5-C6: ACDF. Solid fusion. RIGHT eccentric residual/ recurrent osseous ridging, better demonstrated on prior CT. This is a chronic finding and is  unchanged. Flattening of the RIGHT ventral cord. RIGHT foraminal stenosis due to RIGHT uncovertebral spurring. This is chronic and unchanged.  C6-C7: ACDF. Solid fusion. Central canal and LEFT foramen patent. Chronic RIGHT foraminal stenosis associated with uncovertebral spurring.  C7-T1: Shallow central disc osteophyte complex without central stenosis. Stable minimal bilateral foraminal narrowing associated with short pedicles.  IMPRESSION: No interval change or acute abnormality. Chronic central and foraminal stenosis detailed above. Solid C5 through C7 fusion. Central stenosis is due to a combination of ossification of the posterior longitudinal ligament and disc osteophyte complexes.   Electronically Signed   By: Andreas Newport M.D.   On: 10/09/2014 14:09     EKG Interpretation None       12:58 PM This provider discussed case in great detail with Dr. Manus Gunning, attending physician. As per physician recommended MRI of cervical spine without contrast be performed. Physician to assess patient.  1:13 PM Patient reported that he did not drive, reported that his friend dropped him off.  3:01 PM Dr. Manus Gunning bedside assessing patient. As per attending physician, agree to plan of discharge with patient. Reported that patient will need to follow-up as an outpatient. Reported this is mainly chronic pain. Physician had a long discussion with patient regarding no narcotics with chronic pain-patient was in accordance.  MDM   Final diagnoses:  Chronic neck pain  Paresthesias    Medications  HYDROcodone-acetaminophen (NORCO/VICODIN) 5-325 MG per tablet 1 tablet (1 tablet Oral Given 10/09/14 1355)   Filed Vitals:   10/09/14 1223  BP: 137/91  Pulse: 89  Temp: 98 F (36.7 C)  TempSrc: Oral  Resp: 16  SpO2: 96%   I personally performed the services described in this documentation, which was scribed in my presence. The recorded information has been reviewed and is accurate.  This provider reviewed  patient's chart. Patient was seen and assessed in ED setting on 10/08/2014 regarding neck pain. Imaging was performed at this time. Patient was discharged home with low back and Robaxin. Patient reports back to the ED today secondary to increased pain and burning in his hands bilaterally that is gone progressively worse. Patient has a long history of chronic neck pain, has been seen in the ED numerous times regarding this. Negative pain upon palpation to the cervical spine. Full range of motion to the neck, supple. Decreased strength in the grips the hands bilaterally. Decreased sensation, difficulty with identifying sharp versus dull touch. Decreased interphalangeal strength noted on examination. Discussed case in great detail with attending physician, Dr. Manus Gunning, who recommended MRI cervical spine to be performed. MRI of cervical spine noted no interval change or acute abnormalities. Chronic central and for middle stenosis detailed above, solid C5-C7 fusion, central stenosis is due to a combination of ossification of the posterior longitudinal ligament and disc osteophyte complexes. Unremarkable acute abnormalities noted on MRI of cervical spine. Possibility of impingement nerve. Patient stable, afebrile. Patient not septic appearing. Negative signs of respiratory distress. This neck pain appears to be a chronic  issue, has been dealing with back pain since 2007 - chronic changes noted on MRI of cervical spine. Patient seen and assessed by attending physician who is in accordance with discharge for patient for patient to follow-up as an outpatient regarding chronic issue. Discharged patient. [facility] here discussed with patient to keep appointment with pain management on 10/19/2014. Referred patient to neurosurgeon. Return precautions discussed. Patient left the ED without discharge paperwork.  Raymon Mutton, PA-C 10/09/14 1513  Raymon Mutton, PA-C 10/09/14  1514  Raymon Mutton, PA-C 10/09/14 1515  Glynn Octave, MD 10/09/14 1931

## 2014-10-09 NOTE — Discharge Instructions (Signed)
Please call your doctor for a followup appointment within 24-48 hours. When you talk to your doctor please let them know that you were seen in the emergency department and have them acquire all of your records so that they can discuss the findings with you and formulate a treatment plan to fully care for your new and ongoing problems. Please follow-up with your primary care provider Please follow-up with her neurosurgeon Please follow-up with pain management, please keep appointment on 10/19/2014 Please rest and stay hydrated Please apply warm compressions and massage Please avoid any physical or strenuous activity Please avoid any heavy lifting Please continue to monitor symptoms closely and if symptoms are to worsen or change (fever greater than 101, chills, sweating, nausea, vomiting, chest pain, shortness of breathe, difficulty breathing, weakness, numbness, tingling, worsening or changes to pain pattern, fall, injury, loss of sensation, changes to skin colored to the fingers, inability to swallow, inability to hold objects) please report back to the Emergency Department immediately.   Chronic Pain Discharge Instructions  Emergency care providers appreciate that many patients coming to [place] are in severe pain and we wish to address their pain in the safest, most responsible manner.  It is important to recognize however, that the proper treatment of chronic pain differs from that of the pain of injuries and acute illnesses.  Our goal is to provide quality, safe, personalized care and we thank you for giving [place] the opportunity to serve you. The use of narcotics and related agents for chronic pain syndromes may lead to additional physical and psychological problems.  Nearly as many people die from prescription narcotics each year as die from car crashes.  Additionally, this risk is increased if such prescriptions are obtained from a variety of sources.  Therefore, only your primary care physician or a  pain management specialist is able to safely treat such syndromes with narcotic medications long-term.    Documentation revealing such prescriptions have been sought from multiple sources may prohibit [place] from providing a refill or different narcotic medication.  Your name may be checked first through the [facility] Controlled Substances Reporting System.  This database is a record of controlled substance medication prescriptions that the patient has received.  This has been established by [facility] in an effort to eliminate the dangerous, and often life threatening, practice of obtaining multiple prescriptions from different medical providers.   If you have a chronic pain syndrome (i.e. chronic headaches, recurrent back or neck pain, dental pain, abdominal or pelvis pain without a specific diagnosis, or neuropathic pain such as fibromyalgia) or recurrent visits for the same condition without an acute diagnosis, you may be treated with non-narcotics and other non-addictive medicines.  Allergic reactions or negative side effects that may be reported by a patient to such medications will not typically lead to the use of a narcotic analgesic or other controlled substance as an alternative.   Patients managing chronic pain with a personal physician should have provisions in place for breakthrough pain.  If you are in crisis, you should call your physician.  If your physician directs you to the emergency department, please have the doctor call and speak to our attending physician concerning your care.   When patients come to the Emergency Department (ED) with acute medical conditions in which the Emergency Department physician feels appropriate to prescribe narcotic or sedating pain medication, the physician will prescribe these in very limited quantities.  The amount of these medications will last only until you  can see your primary care physician in his/her office.  Any patient who returns to the ED  seeking refills should expect only non-narcotic pain medications.   In the event of an acute medical condition exists and the emergency physician feels it is necessary that the patient be given a narcotic or sedating medication -  a responsible adult driver should be present in the room prior to the medication being given by the nurse.   Prescriptions for narcotic or sedating medications that have been lost, stolen or expired will not be refilled in the Emergency Department.    Patients who have chronic pain may receive non-narcotic prescriptions until seen by their primary care physician.  It is every patients personal responsibility to maintain active prescriptions with his or her primary care physician or specialist.   Emergency Department Resource Guide 1) Find a Doctor and Pay Out of Pocket Although you won't have to find out who is covered by your insurance plan, it is a good idea to ask around and get recommendations. You will then need to call the office and see if the doctor you have chosen will accept you as a new patient and what types of options they offer for patients who are self-pay. Some doctors offer discounts or will set up payment plans for their patients who do not have insurance, but you will need to ask so you aren't surprised when you get to your appointment.  2) Contact Your Local Health Department Not all health departments have doctors that can see patients for sick visits, but many do, so it is worth a call to see if yours does. If you don't know where your local health department is, you can check in your phone book. The CDC also has a tool to help you locate your state's health department, and many state websites also have listings of all of their local health departments.  3) Find a Walk-in Clinic If your illness is not likely to be very severe or complicated, you may want to try a walk in clinic. These are popping up all over the country in pharmacies, drugstores, and  shopping centers. They're usually staffed by nurse practitioners or physician assistants that have been trained to treat common illnesses and complaints. They're usually fairly quick and inexpensive. However, if you have serious medical issues or chronic medical problems, these are probably not your best option.  No Primary Care Doctor: - Call Health Connect at  [phone] - they can help you locate a primary care doctor that  accepts your insurance, provides certain services, etc. - Physician Referral Service- [phone]  Chronic Pain Problems: Organization         Address  Phone   Notes  [facility]  [phone] Patients need to be referred by their primary care doctor.   Medication Assistance: Organization         Address  Phone   Notes  [facility] Medication [facility] [address] [place], [place] [address] [phone] --Must be a resident of [facility] -- Must have NO insurance coverage whatsoever (no Medicaid/ Medicare, etc.) -- The pt. MUST have a primary care doctor that directs their care regularly and follows them in the community   [facility]  [phone]   [employer]  [phone]    Agencies that provide inexpensive medical care: Organization         Address  Phone   Notes  [facility]  [phone]   [facility]    [phone]   [facility] [address] [place], [place] [address] [phone]   [facility] [address] [place], [place] [phone]   Planned Parenthood    [phone]   [facility]    [phone]   [facility]  [address], [place] Phone:  [phone], Fax:  [phone] Hours of Operation:  9 am - 6 pm, M-F.  Also accepts Medicaid/Medicare and self-pay.  [facility]  [address], [place] Phone: [phone],  Fax: [phone]. Hours of Operation:  8:30 am - 5:30 pm, M-F.  Also accepts Medicaid and self-pay.  [facility] [place] [address] [place] Phone: [phone]   [facility] [address] Natasha Bence [place], [place] [phone], Ext. [phone] Mondays & Thursdays: 7-9 AM.  First 15 patients are seen on a first come, first serve basis.    Medicaid-accepting [facility] Providers:  Organization         Address  Phone   Notes  [facility] [address], [place] [phone] Also accepts self-pay patients.  [facility] [address] Laurell Josephs [place], [place]  [phone]   [facility] [address], [place] [phone]   [facility] [address], [place] [phone]   Renaye Rakers [address], [place]   [phone] Only accepts [place] Access [place] patients after they have their name applied to their card.   Self-Pay (no insurance) in [facility]:  Organization         Address  Phone   Notes  Sickle Cell Patients, [facility] [address] [place], [place] [phone]   [facility] [address] [place], [place] [phone]   [facility] [place]  [address],  [phone]   [facility]/Dr. Osei-Bonsu  [address], [place] or [address], [place] [phone] Phone number for both [place] and [place] locations is the same.  Urgent Medical and [facility] [address] [place] [phone]   [facility] [address], [place] or [address] [phone]   [facility] [address] [place] [phone], phone; [phone], fax Sees patients 1st and 3rd Saturday of every month.  Must not qualify for public or private  insurance (i.e. Medicaid, Medicare, [place] Health Choice, Veterans' Benefits)  Household income should be no more than 200% of the poverty level The clinic cannot treat you if you are pregnant or think you are pregnant  Sexually transmitted diseases are not treated at the clinic.    Dental Care: Organization         Address  Phone  Notes  [facility] Department of [facility] [address] [place], [place] [phone] Accepts children up to age 61 who are enrolled in [place] or  Health Choice; pregnant women with a Medicaid card; and children who have applied for Medicaid or  Health Choice, but were declined, whose parents can pay a reduced fee at time of service.  [facility] Department of [employer]  [place]  [address], [place] [phone] Accepts children up to age 78 who are enrolled in Medicaid or [place] Health Choice; pregnant women with a Medicaid card; and children who have applied for Medicaid or [place] Health Choice, but were declined, whose parents can pay a reduced fee at time of service.  [facility]  [address] [place], [place] [phone] Patients are seen by appointment only. Walk-ins are not accepted. [facility] will see patients 74 years of age and older. Monday - Tuesday (8am-5pm) Most Wednesdays (8:30-5pm) $30 per visit, cash only  [facility] Access [facility]  [address], [facility] [phone] Patients are seen by appointment only. Walk-ins are not accepted. [facility] will see patients 46 years of age and older. One Wednesday Evening (Monthly: Volunteer Based).  $30 per visit, cash only  [employer]  [phone] for adults; Children under age 75, call Graduate Pediatric Dentistry at [phone]. Children aged 48-14, please call [phone] to request a pediatric application.  Dental services are provided in all areas of dental care  including fillings, crowns and bridges, complete and partial dentures, implants, gum treatment, root canals, and extractions. Preventive care is also provided. Treatment is provided to both adults and children. Patients are selected via a lottery and there is often a waiting list.   [facility] [address], [place]  [phone] [email or web address]   [facility] [address] [place], [place] [phone], Ext. [phone] Second and Fourth Thursday of each month, opens at 6:30 AM; Clinic ends at 9 AM.  Patients are seen on a first-come first-served basis, and a limited number are seen during each clinic.   [facility]  [address] Ether Griffins [place], [place] [phone]   Eligibility Requirements You must have lived in [place], [place], or [place] counties for at least the last three months.   You cannot be eligible for state or federal sponsored [place], including [employer], [place], or [employer].   You generally cannot be eligible for healthcare insurance through your employer.    How to apply: Eligibility screenings are held every Tuesday and Wednesday afternoon from 1:00 pm until 4:00 pm. You do not need an appointment for the interview!  [facility] [address] [place], [place] [phone]   [facility] Health Department  [phone]   [facility] Health Department  [phone]   [facility] Health Department  [phone]    Behavioral Health Resources in the Community: Intensive Outpatient Programs Organization         Address  Phone  Notes  [facility] [address], [place], [place] [phone]   [facility] Outpatient [address] [place], [place] [phone]   [facility] [address], [place], [place]  [phone]   [facility] [address],  [place], [place] [phone] or [phone]     Substance Abuse Resources Organization         Address  Phone  Notes  [facility] Drug Services  [phone]   [facility]  [phone]   [facility]  [phone]   Floydene Flock  [phone]   Residential & Outpatient Substance Abuse Program  [phone]   Psychological Services Organization         Address  Phone  Notes  [facility]  [phone]   [facility] Services  [phone]   [facility] [address], [place] [phone] or [phone]    Mobile Crisis Teams Organization         Address  Phone  Notes  [facility], Mobile Crisis Care Unit  [phone]   [facility]  [address] [place], [place] [phone]   Doristine Locks [address] [place] [phone]    Self-Help/Support Groups Organization         Address  Phone             Notes  [facility] [place] - variety of support groups  336- I7437963 Call for more information  Narcotics Anonymous (NA), [facility] [address], [employer] [place]  2 meetings at this location   Statistician         Address  Phone  Notes  [facility] [address],    [place]  [phone]   [facility]  [address] [place] [address], [place], [place] [phone]   [facility] [address] [place], [place] [phone] Admissions: 8am-3pm M-F  [facility] [address],    [place], [place] [phone]   [facility] [address] [place], [place], [place] [phone]   [facility] [address]  [place], [place] [phone]   [facility] - Intensive Outpatient [address] Laurell Josephs [address], [place], [place] [phone]   [facility] ([facility] [address] [place]  [place], [place] [phone] or [phone]   [facility]  Services (RTS) [address], [place], [place] [phone] Accepts Medicaid  [facility] [place] [address]  [place] [phone] Substance Abuse/Addiction Treatment   [facility] Organization         Address  Phone  Notes  [facility]  [phone]   Angie Fava, PhD [address] Ervin Knack [place], [place]   [phone] or [phone]   [facility] Behavioral   [address] [place], [place] [phone]   [facility] [address], [place], [place] [phone] Insurance/Medicaid/sponsorship through [facility] [address]                                    [place], [place] [phone] Therapy/tele-psych/case  [facility] [address], [place] [phone]    Dr. Lolly Mustache  [phone]   [facility]  [employer] [facility] Dept. 1) [address], [place] 2) [address], [place] 3)  [address], [place] [phone]  [phone]   [facility] Child Abuse Hotline 931-441-9207 or [phone] (After Hours)

## 2014-10-09 NOTE — ED Notes (Signed)
Onset yesterday am pt was hanging sheet rock and now has burning pain in bilateral arms.  No posterior neck pain.  Pt had last neck fusion surgery in 2009.  Has had numbness in right thumb and index finger since last surgery.  Pt was seen yesterday and prescribed Robaxin and Mobic, which is not effective.

## 2014-11-18 ENCOUNTER — Emergency Department (HOSPITAL_COMMUNITY)
Admission: EM | Admit: 2014-11-18 | Discharge: 2014-11-18 | Disposition: A | Discharge status: Home / Self Care | Payer: Medicare Other | Attending: Emergency Medicine | Admitting: Emergency Medicine

## 2014-11-18 ENCOUNTER — Encounter (HOSPITAL_COMMUNITY): Payer: Self-pay | Admitting: Physical Medicine and Rehabilitation

## 2014-11-18 DIAGNOSIS — Z87828 Personal history of other (healed) physical injury and trauma: Secondary | ICD-10-CM | POA: Diagnosis not present

## 2014-11-18 DIAGNOSIS — M791 Myalgia: Secondary | ICD-10-CM | POA: Diagnosis not present

## 2014-11-18 DIAGNOSIS — I10 Essential (primary) hypertension: Secondary | ICD-10-CM | POA: Diagnosis not present

## 2014-11-18 DIAGNOSIS — Z79899 Other long term (current) drug therapy: Secondary | ICD-10-CM | POA: Diagnosis not present

## 2014-11-18 DIAGNOSIS — M25561 Pain in right knee: Secondary | ICD-10-CM | POA: Insufficient documentation

## 2014-11-18 DIAGNOSIS — G8929 Other chronic pain: Secondary | ICD-10-CM | POA: Insufficient documentation

## 2014-11-18 DIAGNOSIS — Z791 Long term (current) use of non-steroidal anti-inflammatories (NSAID): Secondary | ICD-10-CM | POA: Insufficient documentation

## 2014-11-18 MED ORDER — LIDOCAINE-EPINEPHRINE 2 %-1:100000 IJ SOLN
20.0000 mL | Freq: Once | INTRAMUSCULAR | Status: DC
  Filled 2014-11-18: qty 20

## 2014-11-18 MED ORDER — LIDOCAINE 5 % EX PTCH: 1.0000 | MEDICATED_PATCH | CUTANEOUS | Status: DC

## 2014-11-18 MED ORDER — NAPROXEN 375 MG PO TABS: 375.0000 mg | ORAL_TABLET | Freq: Two times a day (BID) | ORAL | Status: DC

## 2014-11-18 NOTE — ED Provider Notes (Signed)
CSN: [id number]     Arrival date & time 11/18/14  1305 History  This chart was scribed for non-physician practitioner working, Arthor Captain, PA-C, with Benjiman Core, MD, by Modena Jansky, ED Scribe. This patient was seen in room TR09C/TR09C and the patient's care was started at 2:35 PM.   Chief Complaint  Patient presents with  . Knee Pain   The history is provided by the patient. No language interpreter was used.   HPI Comments: Jared Bass is a 44 y.o. male who presents to the Emergency Department complaining of constant moderate right knee pain that started about a month ago. He reports that he wants his right knee evaluated for pain that has been ongoing for about a month. He states that he has been trying a new exercise regiment that may be a risk factor for his pain. He describes the pain as a sharp sensation. He states that the pain is exacerbated by twisting his RUE. Marland Kitchen   Past Medical History  Diagnosis Date  . Chronic neck pain   . Right eye injury   . Hypertension    Past Surgical History  Procedure Laterality Date  . Neck fusion      c5-7   History reviewed. No pertinent family history. History  Substance Use Topics  . Smoking status: Never Smoker   . Smokeless tobacco: Not on file  . Alcohol Use: No    Review of Systems  Constitutional: Negative for fever.  Musculoskeletal: Positive for myalgias.    Allergies  Tramadol and Other  Home Medications   Prior to Admission medications   Medication Sig Start Date End Date Taking? Authorizing Provider  meloxicam (MOBIC) 7.5 MG tablet Take 1 tablet (7.5 mg total) by mouth daily. 10/08/14   Junius Finner, PA-C  methocarbamol (ROBAXIN) 500 MG tablet Take 1 tablet (500 mg total) by mouth 2 (two) times daily. 10/08/14   Junius Finner, PA-C  naproxen (NAPROSYN) 500 MG tablet Take 1 tablet (500 mg total) by mouth 2 (two) times daily. Patient not taking: Reported on 10/08/2014 12/15/13   Renne Crigler, PA-C   oxyCODONE-acetaminophen (PERCOCET/ROXICET) 5-325 MG per tablet Take 1-2 tablets by mouth every 6 (six) hours as needed for severe pain. Patient not taking: Reported on 10/08/2014 08/28/14   Benjiman Core, MD  propranolol (INDERAL) 10 MG tablet Take 10 mg by mouth 3 (three) times daily.    Historical Provider, MD   BP 135/85 mmHg  Pulse 80  Temp(Src) 98.7 F (37.1 C) (Oral)  Resp 18  SpO2 99% Physical Exam  Constitutional: He is oriented to person, place, and time. He appears well-developed and well-nourished. No distress.  HENT:  Head: Normocephalic and atraumatic.  Neck: Neck supple. No tracheal deviation present.  Cardiovascular: Normal rate.   Pulmonary/Chest: Effort normal. No respiratory distress.  Musculoskeletal: Normal range of motion.  Positive McMurray's test. Good ligament stability.   Neurological: He is alert and oriented to person, place, and time.  Skin: Skin is warm and dry.  Psychiatric: He has a normal mood and affect. His behavior is normal.  Nursing note and vitals reviewed.   ED Course  Procedures (including critical care time) DIAGNOSTIC STUDIES: Oxygen Saturation is 99% on RA, normal by my interpretation.    COORDINATION OF CARE: 2:39 PM- Pt advised of plan for treatment which includes medication and labs and pt agrees.  Labs Review Labs Reviewed - No data to display  Imaging Review No results found.   EKG Interpretation None  MDM   Final diagnoses:  Knee pain, acute, right    Patient with knee pain. + mcmurray's test.  Pt advised to follow up with orthopedics if symptoms persist. Patient given brace while in ED, conservative therapy recommended and discussed. Patient will be dc home & is agreeable with above plan.  I personally performed the services described in this documentation, which was scribed in my presence. The recorded information has been reviewed and is accurate.       Arthor Captain, PA-C 11/20/14 0146  Benjiman Core, MD 11/22/14 [phone]

## 2014-11-18 NOTE — Discharge Instructions (Signed)
Cryotherapy °Cryotherapy means treatment with cold. Ice or gel packs can be used to reduce both pain and swelling. Ice is the most helpful within the first 24 to 48 hours after an injury or flare-up from overusing a muscle or joint. Sprains, strains, spasms, burning pain, shooting pain, and aches can all be eased with ice. Ice can also be used when recovering from surgery. Ice is effective, has very few side effects, and is safe for most people to use. °PRECAUTIONS  °Ice is not a safe treatment option for people with: °· Raynaud phenomenon. This is a condition affecting small blood vessels in the extremities. Exposure to cold may cause your problems to return. °· Cold hypersensitivity. There are many forms of cold hypersensitivity, including: °· Cold urticaria. Red, itchy hives appear on the skin when the tissues begin to warm after being iced. °· Cold erythema. This is a red, itchy rash caused by exposure to cold. °· Cold hemoglobinuria. Red blood cells break down when the tissues begin to warm after being iced. The hemoglobin that carry oxygen are passed into the urine because they cannot combine with blood proteins fast enough. °· Numbness or altered sensitivity in the area being iced. °If you have any of the following conditions, do not use ice until you have discussed cryotherapy with your caregiver: °· Heart conditions, such as arrhythmia, angina, or chronic heart disease. °· High blood pressure. °· Healing wounds or open skin in the area being iced. °· Current infections. °· Rheumatoid arthritis. °· Poor circulation. °· Diabetes. °Ice slows the blood flow in the region it is applied. This is beneficial when trying to stop inflamed tissues from spreading irritating chemicals to surrounding tissues. However, if you expose your skin to cold temperatures for too long or without the proper protection, you can damage your skin or nerves. Watch for signs of skin damage due to cold. °HOME CARE INSTRUCTIONS °Follow  these tips to use ice and cold packs safely. °· Place a dry or damp towel between the ice and skin. A damp towel will cool the skin more quickly, so you may need to shorten the time that the ice is used. °· For a more rapid response, add gentle compression to the ice. °· Ice for no more than 10 to 20 minutes at a time. The bonier the area you are icing, the less time it will take to get the benefits of ice. °· Check your skin after 5 minutes to make sure there are no signs of a poor response to cold or skin damage. °· Rest 20 minutes or more between uses. °· Once your skin is numb, you can end your treatment. You can test numbness by very lightly touching your skin. The touch should be so light that you do not see the skin dimple from the pressure of your fingertip. When using ice, most people will feel these normal sensations in this order: cold, burning, aching, and numbness. °· Do not use ice on someone who cannot communicate their responses to pain, such as small children or people with dementia. °HOW TO MAKE AN ICE PACK °Ice packs are the most common way to use ice therapy. Other methods include ice massage, ice baths, and cryosprays. Muscle creams that cause a cold, tingly feeling do not offer the same benefits that ice offers and should not be used as a substitute unless recommended by your caregiver. °To make an ice pack, do one of the following: °· Place crushed ice or a   bag of frozen vegetables in a sealable plastic bag. Squeeze out the excess air. Place this bag inside another plastic bag. Slide the bag into a pillowcase or place a damp towel between your skin and the bag.  Mix 3 parts water with 1 part rubbing alcohol. Freeze the mixture in a sealable plastic bag. When you remove the mixture from the freezer, it will be slushy. Squeeze out the excess air. Place this bag inside another plastic bag. Slide the bag into a pillowcase or place a damp towel between your skin and the bag. SEEK MEDICAL CARE  IF:  You develop white spots on your skin. This may give the skin a blotchy (mottled) appearance.  Your skin turns blue or pale.  Your skin becomes waxy or hard.  Your swelling gets worse. MAKE SURE YOU:   Understand these instructions.  Will watch your condition.  Will get help right away if you are not doing well or get worse. Document Released: 02/12/2011 Document Revised: 11/02/2013 Document Reviewed: 02/12/2011 [facility] Patient Information 2015 [place], [place]. This information is not intended to replace advice given to you by your health care provider. Make sure you discuss any questions you have with your health care provider.  Knee Pain The knee is the complex joint between your thigh and your lower leg. It is made up of bones, tendons, ligaments, and cartilage. The bones that make up the knee are:  The femur in the thigh.  The tibia and fibula in the lower leg.  The patella or kneecap riding in the groove on the lower femur. CAUSES  Knee pain is a common complaint with many causes. A few of these causes are:  Injury, such as:  A ruptured ligament or tendon injury.  Torn cartilage.  Medical conditions, such as:  Gout  Arthritis  Infections  Overuse, over training, or overdoing a physical activity. Knee pain can be minor or severe. Knee pain can accompany debilitating injury. Minor knee problems often respond well to self-care measures or get well on their own. More serious injuries may need medical intervention or even surgery. SYMPTOMS The knee is complex. Symptoms of knee problems can vary widely. Some of the problems are:  Pain with movement and weight bearing.  Swelling and tenderness.  Buckling of the knee.  Inability to straighten or extend your knee.  Your knee locks and you cannot straighten it.  Warmth and redness with pain and fever.  Deformity or dislocation of the kneecap. DIAGNOSIS  Determining what is wrong may be very straight  forward such as when there is an injury. It can also be challenging because of the complexity of the knee. Tests to make a diagnosis may include:  Your caregiver taking a history and doing a physical exam.  Routine X-rays can be used to rule out other problems. X-rays will not reveal a cartilage tear. Some injuries of the knee can be diagnosed by:  Arthroscopy a surgical technique by which a small video camera is inserted through tiny incisions on the sides of the knee. This procedure is used to examine and repair internal knee joint problems. Tiny instruments can be used during arthroscopy to repair the torn knee cartilage (meniscus).  Arthrography is a radiology technique. A contrast liquid is directly injected into the knee joint. Internal structures of the knee joint then become visible on X-ray film.  An MRI scan is a non X-ray radiology procedure in which magnetic fields and a computer produce two- or three-dimensional images  of the inside of the knee. Cartilage tears are often visible using an MRI scanner. MRI scans have largely replaced arthrography in diagnosing cartilage tears of the knee.  Blood work.  Examination of the fluid that helps to lubricate the knee joint (synovial fluid). This is done by taking a sample out using a needle and a syringe. TREATMENT The treatment of knee problems depends on the cause. Some of these treatments are:  Depending on the injury, proper casting, splinting, surgery, or physical therapy care will be needed.  Give yourself adequate recovery time. Do not overuse your joints. If you begin to get sore during workout routines, back off. Slow down or do fewer repetitions.  For repetitive activities such as cycling or running, maintain your strength and nutrition.  Alternate muscle groups. For example, if you are a weight lifter, work the upper body on one day and the lower body the next.  Either tight or weak muscles do not give the proper support for  your knee. Tight or weak muscles do not absorb the stress placed on the knee joint. Keep the muscles surrounding the knee strong.  Take care of mechanical problems.  If you have flat feet, orthotics or special shoes may help. See your caregiver if you need help.  Arch supports, sometimes with wedges on the inner or outer aspect of the heel, can help. These can shift pressure away from the side of the knee most bothered by osteoarthritis.  A brace called an "unloader" brace also may be used to help ease the pressure on the most arthritic side of the knee.  If your caregiver has prescribed crutches, braces, wraps or ice, use as directed. The acronym for this is PRICE. This means protection, rest, ice, compression, and elevation.  Nonsteroidal anti-inflammatory drugs (NSAIDs), can help relieve pain. But if taken immediately after an injury, they may actually increase swelling. Take NSAIDs with food in your stomach. Stop them if you develop stomach problems. Do not take these if you have a history of ulcers, stomach pain, or bleeding from the bowel. Do not take without your caregiver's approval if you have problems with fluid retention, heart failure, or kidney problems.  For ongoing knee problems, physical therapy may be helpful.  Glucosamine and chondroitin are over-the-counter dietary supplements. Both may help relieve the pain of osteoarthritis in the knee. These medicines are different from the usual anti-inflammatory drugs. Glucosamine may decrease the rate of cartilage destruction.  Injections of a corticosteroid drug into your knee joint may help reduce the symptoms of an arthritis flare-up. They may provide pain relief that lasts a few months. You may have to wait a few months between injections. The injections do have a small increased risk of infection, water retention, and elevated blood sugar levels.  Hyaluronic acid injected into damaged joints may ease pain and provide lubrication.  These injections may work by reducing inflammation. A series of shots may give relief for as long as 6 months.  Topical painkillers. Applying certain ointments to your skin may help relieve the pain and stiffness of osteoarthritis. Ask your pharmacist for suggestions. Many over the-counter products are approved for temporary relief of arthritis pain.  In some countries, doctors often prescribe topical NSAIDs for relief of chronic conditions such as arthritis and tendinitis. A review of treatment with NSAID creams found that they worked as well as oral medications but without the serious side effects. PREVENTION  Maintain a healthy weight. Extra pounds put more strain on your  joints.  Get strong, stay limber. Weak muscles are a common cause of knee injuries. Stretching is important. Include flexibility exercises in your workouts.  Be smart about exercise. If you have osteoarthritis, chronic knee pain or recurring injuries, you may need to change the way you exercise. This does not mean you have to stop being active. If your knees ache after jogging or playing basketball, consider switching to swimming, water aerobics, or other low-impact activities, at least for a few days a week. Sometimes limiting high-impact activities will provide relief.  Make sure your shoes fit well. Choose footwear that is right for your sport.  Protect your knees. Use the proper gear for knee-sensitive activities. Use kneepads when playing volleyball or laying carpet. Buckle your seat belt every time you drive. Most shattered kneecaps occur in car accidents.  Rest when you are tired. SEEK MEDICAL CARE IF:  You have knee pain that is continual and does not seem to be getting better.  SEEK IMMEDIATE MEDICAL CARE IF:  Your knee joint feels hot to the touch and you have a high fever. MAKE SURE YOU:   Understand these instructions.  Will watch your condition.  Will get help right away if you are not doing well or get  worse. Document Released: 04/15/2007 Document Revised: 09/10/2011 Document Reviewed: 04/15/2007 [facility] Patient Information 2015 [place], [place]. This information is not intended to replace advice given to you by your health care provider. Make sure you discuss any questions you have with your health care provider.  Knee Bracing Knee braces are supports to help stabilize and protect an injured or painful knee. They come in many different styles. They should support and protect the knee without increasing the chance of other injuries to yourself or others. It is important not to have a false sense of security when using a brace. Knee braces that help you to keep using your knee:  Do not restore normal knee stability under high stress forces.  May decrease some aspects of athletic performance. Some of the different types of knee braces are:  Prophylactic knee braces are designed to prevent or reduce the severity of knee injuries during sports that make injury to the knee more likely.  Rehabilitative knee braces are designed to allow protected motion of:  Injured knees.  Knees that have been treated with or without surgery. There is no evidence that the use of a supportive knee brace protects the graft following a successful anterior cruciate ligament (ACL) reconstruction. However, braces are sometimes used to:   Protect injured ligaments.  Control knee movement during the initial healing period. They may be used as part of the treatment program for the various injured ligaments or cartilage of the knee including the:  Anterior cruciate ligament.  Medial collateral ligament.  Medial or lateral cartilage (meniscus).  Posterior cruciate ligament.  Lateral collateral ligament. Rehabilitative knee braces are most commonly used:  During crutch-assisted walking right after injury.  During crutch-assisted walking right after surgery to repair the cartilage and/or cruciate ligament  injury.  For a short period of time, 2-8 weeks, after the injury or surgery. The value of a rehabilitative brace as opposed to a cast or splint includes the:  Ability to adjust the brace for swelling.  Ability to remove the brace for examinations, icing, or showering.  Ability to allow for movement in a controlled range of motion. Functional knee braces give support to knees that have already been injured. They are designed to provide stability for  the injured knee and provide protection after repair. Functional knee braces may not affect performance much. Lower extremity muscle strengthening, flexibility, and improvement in technique are more important than bracing in treating ligamentous knee injuries. Functional braces are not a substitute for rehabilitation or surgical procedures. °Unloader/off-loader braces are designed to provide pain relief in arthritic knees. Patients with wear and tear arthritis from growing old or from an old cartilage injury (osteoarthritis) of the knee, and bowlegged (varus) or knock-knee (valgus) deformities, often develop increased pain in the arthritic side due to increased loading. Unloader/off-loader braces are made to reduce uneven loading in such knees. There is reduction in bowing out movement in bowlegged knees when the correct unloader brace is used. Patients with advanced osteoarthritis or severe varus or valgus alignment problems would not likely benefit from bracing. °Patellofemoral braces help the kneecap to move smoothly and well centered over the end of the femur in the knee.  °Most people who wear knee braces feel that they help. However, there is a lack of scientific evidence that knee braces are helpful at the level needed for athletic participation to prevent injury. In spite of this, athletes report an increase in knee stability, pain relief, performance improvement, and confidence during athletics when using a brace.  °Different knee problems require  different knee braces: °· Your caregiver may suggest one kind of knee brace after knee surgery. °· A caregiver may choose another kind of knee brace for support instead of surgery for some types of torn ligaments. °· You may also need one for pain in the front of your knee that is not getting better with strengthening and flexibility exercises. °Get your caregiver's advice if you want to try a knee brace. The caregiver will advise you on where to get them and provide a prescription when it is needed to fashion and/or fit the brace. °Knee braces are the least important part of preventing knee injuries or getting better following injury. Stretching, strengthening and technique improvement are far more important in caring for and preventing knee injuries. When strengthening your knee, increase your activities a little at a time so as not to develop injuries from overuse. Work out an exercise plan with your caregiver and/or physical therapist to get the best program for you. Do not let a knee brace become a crutch. °Always remember, there are no braces which support the knee as well as your original ligaments and cartilage you were born with. Conditioning, proper warm-up, and stretching remain the most important parts of keeping your knees healthy. °HOW TO USE A KNEE BRACE °· During sports, knee braces should be used as directed by your caregiver. °· Make sure that the hinges are where the knee bends. °· Straps, tapes, or hook-and-loop tapes should be fastened around your leg as instructed. °· You should check the placement of the brace during activities to make sure that it has not moved. Poorly positioned braces can hurt rather than help you. °· To work well, a knee brace should be worn during all activities that put you at risk of knee injury. °· Warm up properly before beginning athletic activities. °HOME CARE INSTRUCTIONS °· Knee braces often get damaged during normal use. Replace worn-out braces for maximum  benefit. °· Clean regularly with soap and water. °· Inspect your brace often for wear and tear. °· Cover exposed metal to protect others from injury. °· Durable materials may cost more, but last longer. °SEEK IMMEDIATE MEDICAL CARE IF:  °· Your knee seems   to be getting worse rather than better.  You have increasing pain or swelling in the knee.  You have problems caused by the knee brace.  You have increased swelling or inflammation (redness or soreness) in your knee.  Your knee becomes warm and more painful and you develop an unexplained temperature over 101F (38.3C). MAKE SURE YOU:   Understand these instructions.  Will watch your condition.  Will get help right away if you are not doing well or get worse. See your caregiver, physical therapist, or orthopedic surgeon for additional information. Document Released: 09/08/2003 Document Revised: 11/02/2013 Document Reviewed: 12/15/2008 [facility] Patient Information 2015 [place], [place]. This information is not intended to replace advice given to you by your health care provider. Make sure you discuss any questions you have with your health care provider.

## 2014-11-18 NOTE — ED Notes (Signed)
Pt presents to department for evaluation of R knee pain. Ongoing x1 month. Pain becomes worse with movement. 8/10 pain upon arrival to ED.

## 2015-04-16 IMAGING — CR CERVICAL SPINE - 2-3 VIEW
1 series · 3 of 3 positions shown · non-contrast
Comparison: none

REASON FOR EXAM: cervical spine tenderness
COMMENTS:   LMP: (Male)

PROCEDURE:     DXR - DXR C- SPINE AP AND LATERAL  - April 27, 2013  [DATE]
RESULT:     Technique: Cervical spine series was obtained.

[Series 1: w cervical spine lat · 0.14mm/px · 3 of 3 slices shown]
[im 1/3]
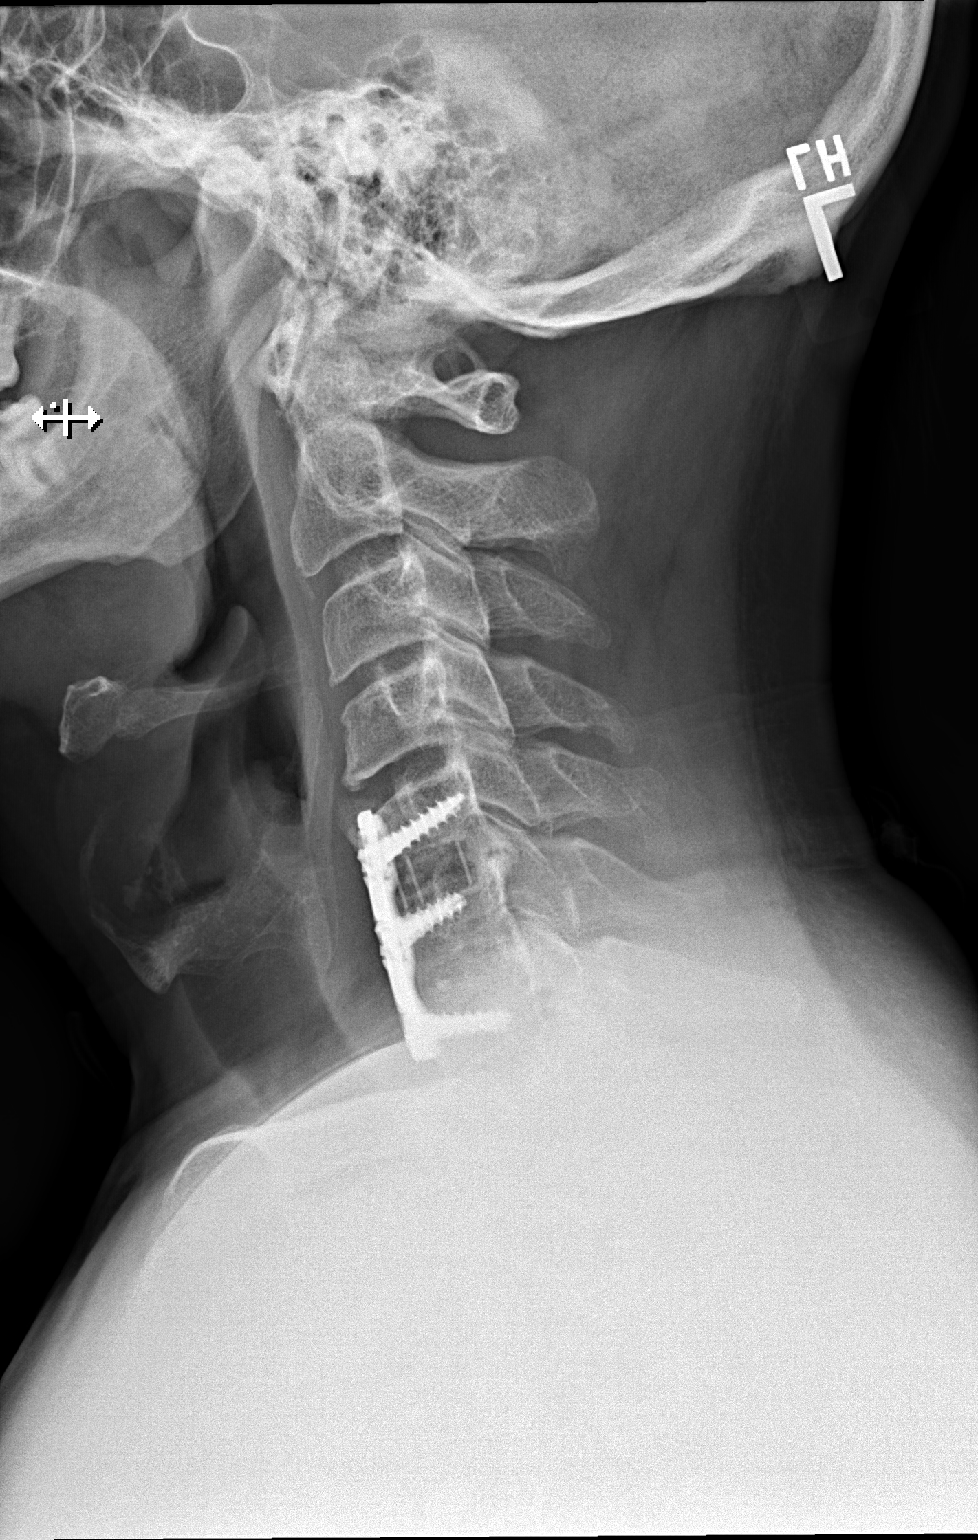
[im 2/3]
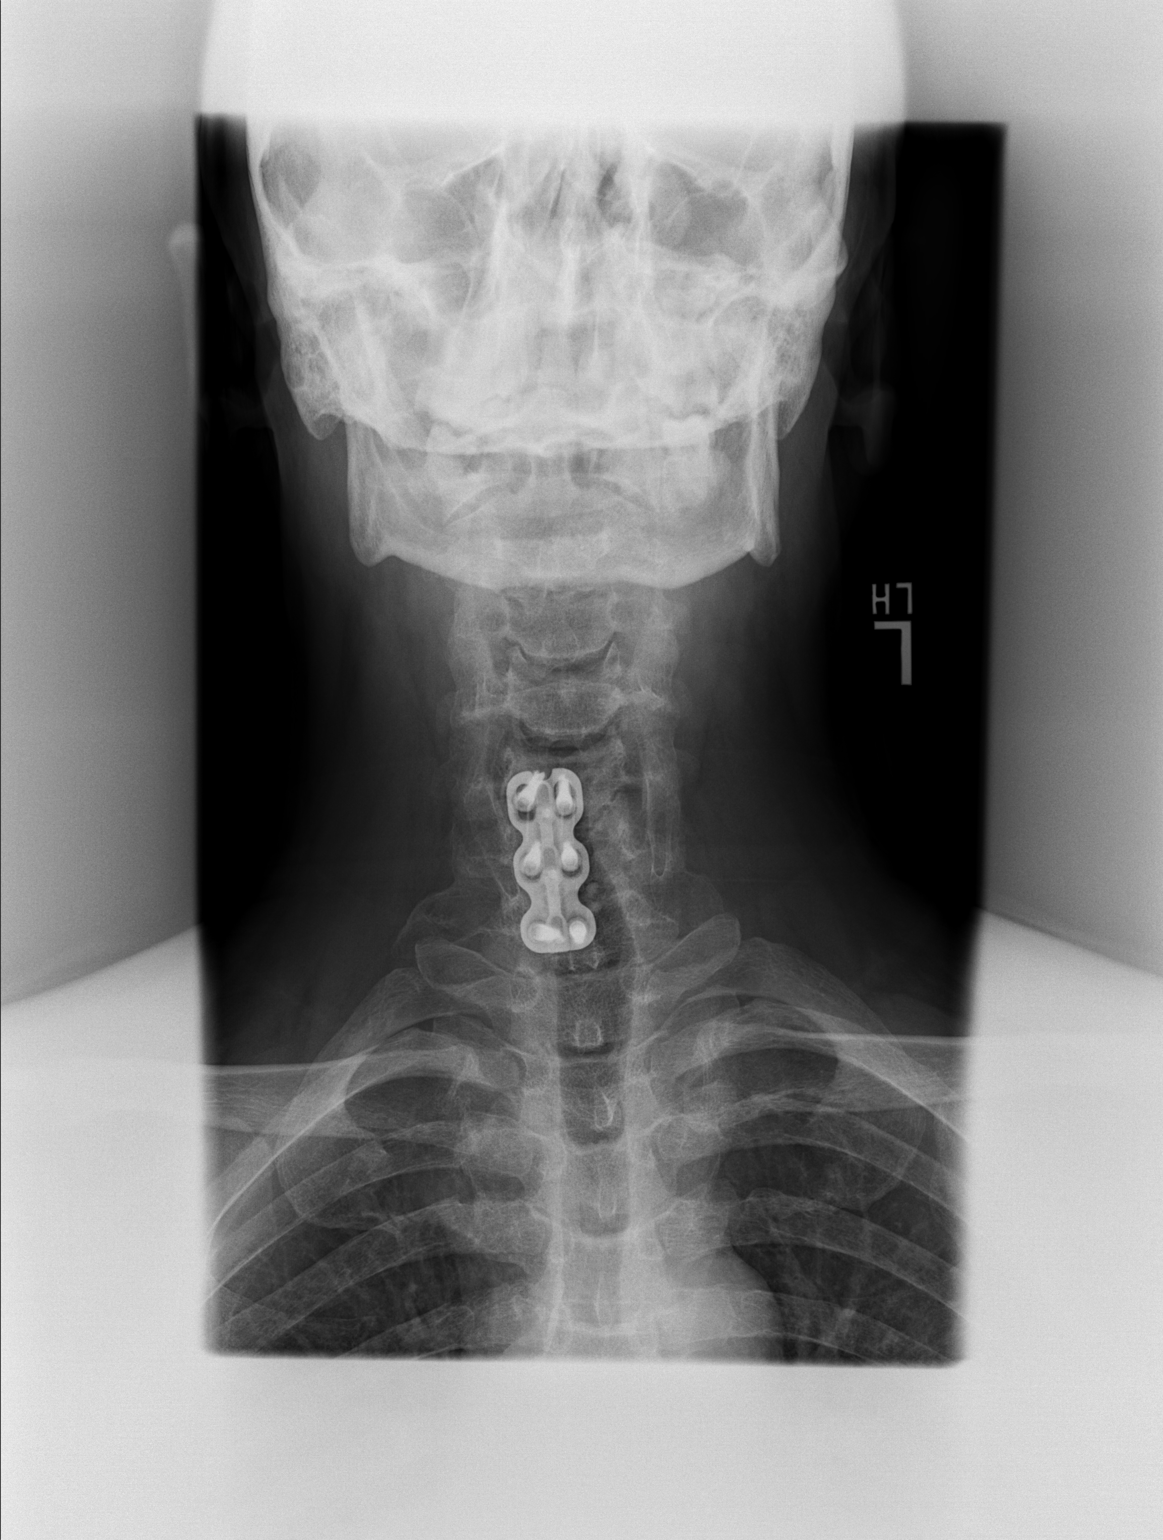
[im 3/3]
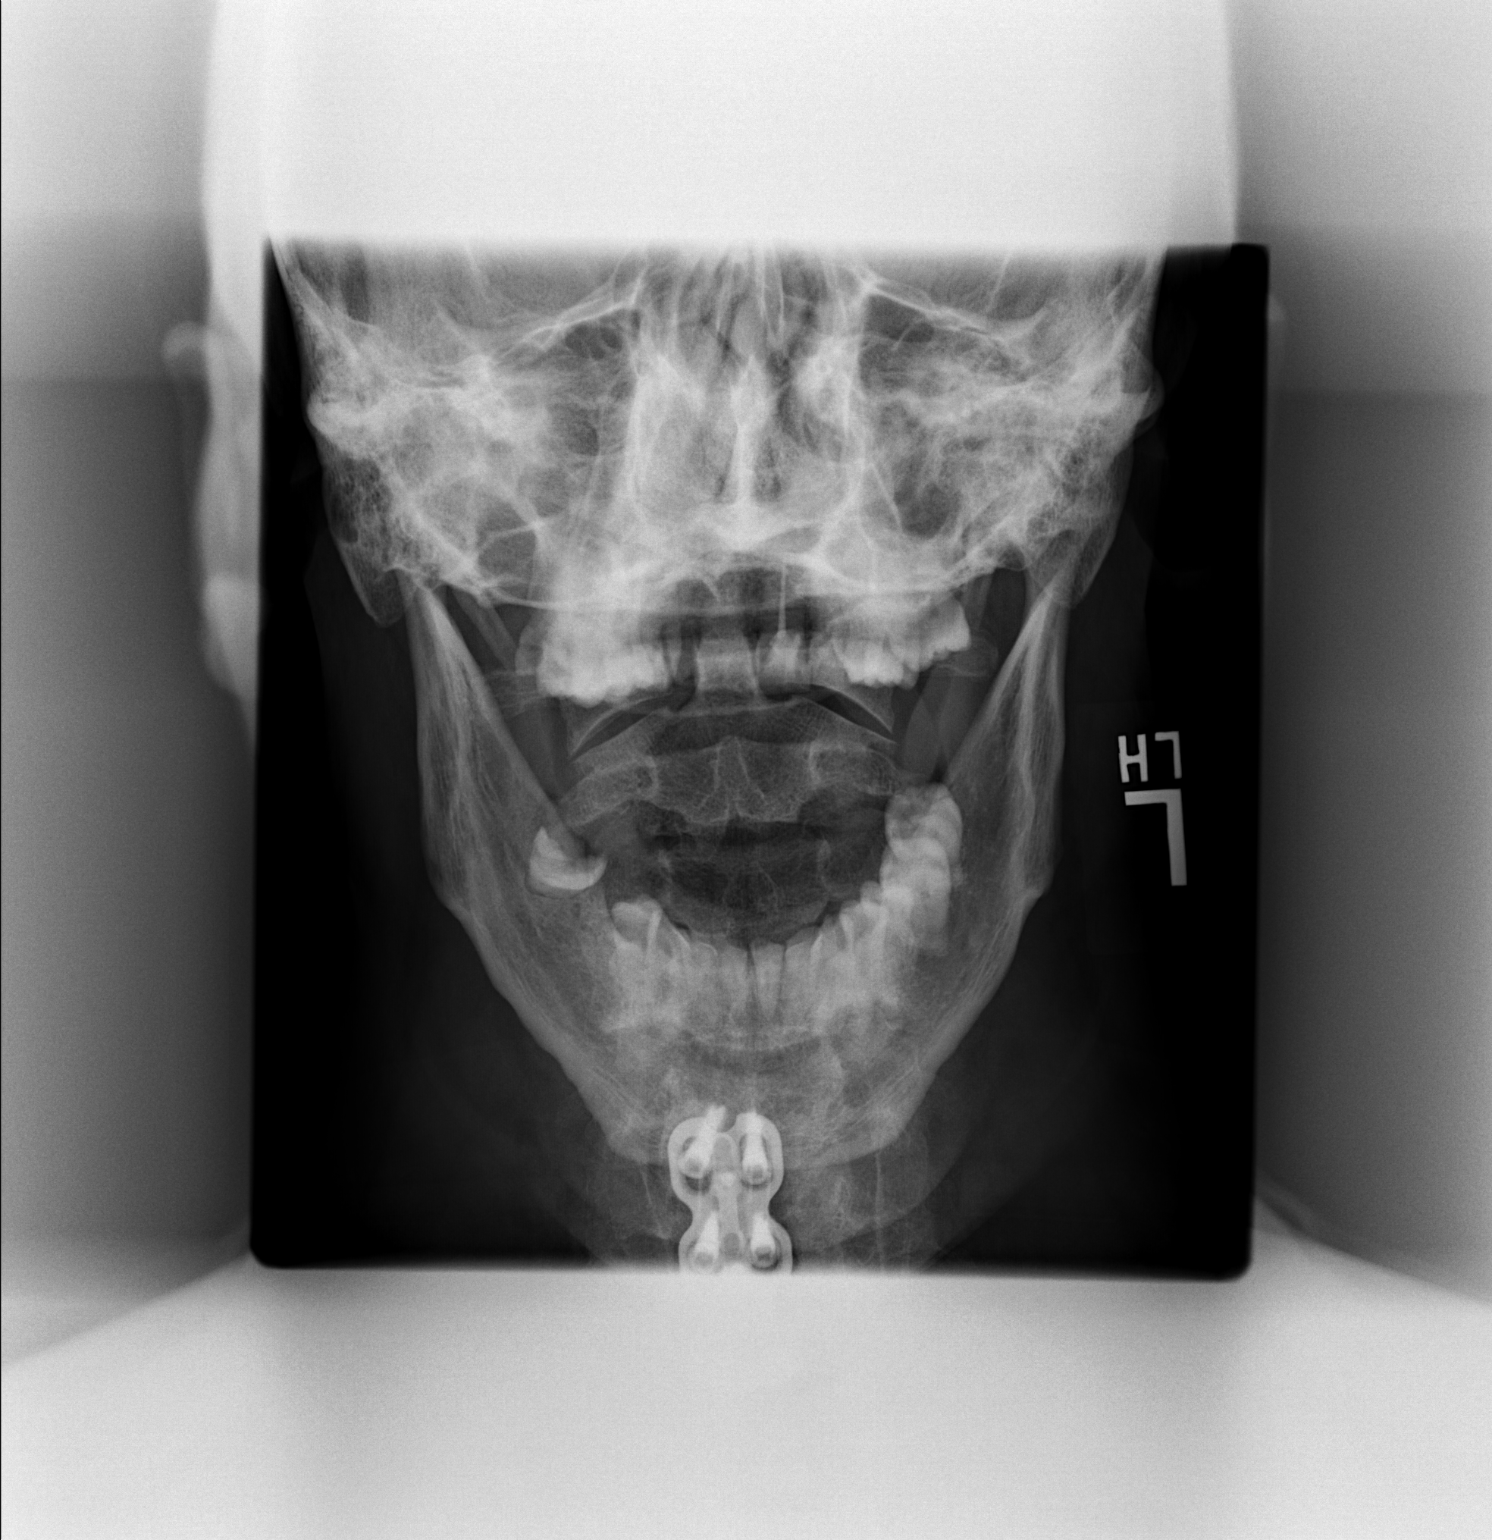

[3 of 3 positions shown; findings below may reference images not displayed]

FINDINGS: There is no evidence of fracture, dislocation nor malalignment.
There is no evidence of prevertebral soft tissue swelling. Patient status
post anterior fusion of the C5-C7 vertebra. The hardware appears intact
without evidence of loosening or failure. Prosthetic intravertebral disc is
appreciated at the C5-6 level.
IMPRESSION: No evidence of acute osseous abnormalities.
2. Comparison 05/02/2011

## 2015-04-27 ENCOUNTER — Encounter: Payer: Medicare Other | Admitting: Pain Medicine

## 2015-05-04 ENCOUNTER — Encounter: Payer: Self-pay | Admitting: Pain Medicine

## 2015-05-04 ENCOUNTER — Telehealth: Payer: Self-pay | Admitting: Pain Medicine

## 2015-05-04 ENCOUNTER — Other Ambulatory Visit: Payer: Self-pay | Admitting: Pain Medicine

## 2015-05-04 ENCOUNTER — Ambulatory Visit: Payer: Medicare Other | Attending: Pain Medicine | Admitting: Pain Medicine

## 2015-05-04 VITALS — BP 139/100 | HR 71 | Temp 98.2°F | Resp 18 | Ht 68.0 in | Wt 211.0 lb

## 2015-05-04 DIAGNOSIS — F411 Generalized anxiety disorder: Secondary | ICD-10-CM

## 2015-05-04 DIAGNOSIS — F1190 Opioid use, unspecified, uncomplicated: Secondary | ICD-10-CM

## 2015-05-04 DIAGNOSIS — M792 Neuralgia and neuritis, unspecified: Secondary | ICD-10-CM

## 2015-05-04 DIAGNOSIS — M47812 Spondylosis without myelopathy or radiculopathy, cervical region: Secondary | ICD-10-CM | POA: Diagnosis not present

## 2015-05-04 DIAGNOSIS — Z981 Arthrodesis status: Secondary | ICD-10-CM | POA: Diagnosis not present

## 2015-05-04 DIAGNOSIS — E785 Hyperlipidemia, unspecified: Secondary | ICD-10-CM | POA: Diagnosis not present

## 2015-05-04 DIAGNOSIS — M4802 Spinal stenosis, cervical region: Secondary | ICD-10-CM

## 2015-05-04 DIAGNOSIS — M25511 Pain in right shoulder: Secondary | ICD-10-CM | POA: Diagnosis not present

## 2015-05-04 DIAGNOSIS — M5412 Radiculopathy, cervical region: Secondary | ICD-10-CM

## 2015-05-04 DIAGNOSIS — M5030 Other cervical disc degeneration, unspecified cervical region: Secondary | ICD-10-CM

## 2015-05-04 DIAGNOSIS — Z87898 Personal history of other specified conditions: Secondary | ICD-10-CM

## 2015-05-04 DIAGNOSIS — M961 Postlaminectomy syndrome, not elsewhere classified: Secondary | ICD-10-CM | POA: Diagnosis not present

## 2015-05-04 DIAGNOSIS — F1120 Opioid dependence, uncomplicated: Secondary | ICD-10-CM

## 2015-05-04 DIAGNOSIS — G8929 Other chronic pain: Secondary | ICD-10-CM | POA: Diagnosis not present

## 2015-05-04 DIAGNOSIS — M542 Cervicalgia: Secondary | ICD-10-CM | POA: Insufficient documentation

## 2015-05-04 DIAGNOSIS — Z79891 Long term (current) use of opiate analgesic: Secondary | ICD-10-CM

## 2015-05-04 DIAGNOSIS — Z79899 Other long term (current) drug therapy: Secondary | ICD-10-CM

## 2015-05-04 DIAGNOSIS — Z87828 Personal history of other (healed) physical injury and trauma: Secondary | ICD-10-CM | POA: Insufficient documentation

## 2015-05-04 DIAGNOSIS — Z5181 Encounter for therapeutic drug level monitoring: Secondary | ICD-10-CM

## 2015-05-04 DIAGNOSIS — I10 Essential (primary) hypertension: Secondary | ICD-10-CM

## 2015-05-04 DIAGNOSIS — M6248 Contracture of muscle, other site: Secondary | ICD-10-CM

## 2015-05-04 DIAGNOSIS — M7918 Myalgia, other site: Secondary | ICD-10-CM

## 2015-05-04 DIAGNOSIS — M62838 Other muscle spasm: Secondary | ICD-10-CM

## 2015-05-04 DIAGNOSIS — M791 Myalgia: Secondary | ICD-10-CM

## 2015-05-04 DIAGNOSIS — E559 Vitamin D deficiency, unspecified: Secondary | ICD-10-CM | POA: Diagnosis not present

## 2015-05-04 DIAGNOSIS — M25521 Pain in right elbow: Secondary | ICD-10-CM

## 2015-05-04 DIAGNOSIS — M79603 Pain in arm, unspecified: Secondary | ICD-10-CM | POA: Insufficient documentation

## 2015-05-04 HISTORY — DX: Personal history of other specified conditions: Z87.898

## 2015-05-04 HISTORY — DX: Other cervical disc degeneration, unspecified cervical region: M50.30

## 2015-05-04 HISTORY — DX: Personal history of other (healed) physical injury and trauma: Z87.828

## 2015-05-04 MED ORDER — GABAPENTIN 400 MG PO CAPS: 800.0000 mg | ORAL_CAPSULE | Freq: Three times a day (TID) | ORAL | Status: DC

## 2015-05-04 MED ORDER — OXYCODONE HCL 5 MG PO CAPS: 5.0000 mg | ORAL_CAPSULE | Freq: Every day | ORAL | Status: DC | PRN

## 2015-05-04 MED ORDER — TIZANIDINE HCL 2 MG PO CAPS: 2.0000 mg | ORAL_CAPSULE | Freq: Three times a day (TID) | ORAL | Status: DC | PRN

## 2015-05-04 NOTE — Progress Notes (Signed)
Patient's Name: Jared Bass MRN: [id number] DOB: 10-09-1970 DOS: 05/04/2015  Primary Reason(s) for Visit: Encounter for Medication Management. CC: Neck Pain and Arm Pain   HPI:   [name] is a 44 y.o. year old, male patient, who returns today as an established patient. 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); Long term current use of opiate analgesic; Long term prescription opiate use; Opiate use; Opiate dependence (HCC); Encounter for therapeutic drug level monitoring; Neuropathic pain; Neurogenic pain; DDD (degenerative disc disease), cervical; Chronic radicular cervical pain (right side); Chronic right shoulder pain; Chronic pain of right elbow; Cervical spinal stenosis; Cervical foraminal stenosis (right side); Myofascial pain; Muscle spasms of neck; Vitamin D deficiency; History of tachycardia; Essential hypertension; History of head injury; Generalized anxiety disorder; and Hyperlipidemia on his problem list.. His primarily concern today is the Neck Pain and Arm Pain    The patient comes into clinic today for follow-up. He has a long-standing history of chronic neck pain and arm pain. His history is significant for 2 prior neck surgeries total and anterior approach. 4 was done around 2009 at the C6-7 level and the second one extended to surgery from C5-C7. Both of those surgeries were done by Dr. Bridgette Habermann. The patient indicates having tried several medications for the pain he has been taking opioids since 2009. He tried morphine which cause severe constipation. He describes that hydrocodone as having being very weak. Opana did help. M.D. oxycodone 5 only lasts for hours. He currently takes Neurontin 400 mg by mouth 4 times a day and is not having any problems with it. Today we plan to increase the Neurontin to 600 mg by  mouth 4 times a day. In terms of muscle relaxants, he indicates having tried Flexeril which caused him to feel drunk and he also tried [place] which again he did not like because of the cognitive impairment. At one point he tried some Zanaflex 4 mg which apparently worked but it was too strong. In view of this, we will try the Zanaflex 2 mg. He describes the pain as going down the right arm to his first and second time he was seems to be a C6-7 distribution. His primary pain is in the right arm and is described as a burning aching sensation. His second worst pain is the neck with the right being worse on the left. Today's Pain Score: 8  Pain Type: Chronic pain Pain Location: Neck Pain Orientation: Right Pain Descriptors / Indicators: Numbness, Tingling, Aching, Burning, Sharp Pain Frequency: Constant  Date of Last Visit: Date of Last Visit:  (unknown) Service Provided on Last Visit:    Pharmacotherapy Review:   Side-effects or Adverse reactions: None reported. Effectiveness: Described as relatively effective, allowing for increase in activities of daily living (ADL). Onset of action: Within expected pharmacological parameters. Duration of action: Within normal limits for medication. Peak effect: Timing and results are as within normal expected parameters. [place] PMP: Compliant with practice rules and regulations. Last UDS available in the system:     Component Value Date/Time   LABOPIA POSITIVE* 08/04/2008 0900   COCAINSCRNUR NONE DETECTED 08/04/2008 0900   LABBENZ POSITIVE* 08/04/2008 0900   AMPHETMU NONE DETECTED 08/04/2008 0900   THCU NONE DETECTED 08/04/2008 0900   LABBARB  08/04/2008 0900    NONE DETECTED        DRUG SCREEN FOR MEDICAL  PURPOSES ONLY.  IF CONFIRMATION IS NEEDED FOR ANY PURPOSE, NOTIFY LAB WITHIN 5 DAYS.        LOWEST DETECTABLE LIMITS FOR URINE DRUG SCREEN Drug Class       Cutoff (ng/mL) Amphetamine      1000 Barbiturate      200 Benzodiazepine   200 Tricyclics        300 Opiates          300 Cocaine          300 THC              50    DST: Compliant with practice rules and regulations. Lab work: No new labs ordered by our practice. Treatment compliance: Compliant. Substance Use Disorder (SUD) Risk Level: Low Planned course of action: Continue therapy as is.  Allergies: Mr. Urquilla is allergic to tramadol and other.  Meds: The patient has a current medication list which includes the following prescription(s): oxycodone, propranolol, gabapentin, and tizanidine. Requested Prescriptions   Signed Prescriptions Disp Refills  . oxycodone (OXY-IR) 5 MG capsule 150 capsule 0    Sig: Take 1 capsule (5 mg total) by mouth 5 (five) times daily as needed for pain.  Marland Kitchen gabapentin (NEURONTIN) 400 MG capsule 180 capsule 0    Sig: Take 2 capsules (800 mg total) by mouth every 8 (eight) hours.  . tizanidine (ZANAFLEX) 2 MG capsule 90 capsule 1    Sig: Take 1 capsule (2 mg total) by mouth 3 (three) times daily as needed for muscle spasms.    ROS: Constitutional: Afebrile, no chills, well hydrated and well nourished Gastrointestinal: negative Musculoskeletal:negative Neurological: negative Behavioral/Psych: negative  PFSH: Medical:  Jared Bass  has a past medical history of Chronic neck pain; Right eye injury; Hypertension; History of tachycardia (05/04/2015); and History of head injury (05/04/2015). Family: family history includes COPD in his father and mother; Cancer in his father; Hypertension in his mother. Surgical:  has past surgical history that includes neck fusion. Tobacco:  reports that he has never smoked. He does not have any smokeless tobacco history on file. Alcohol:  reports that he does not drink alcohol. Drug:  reports that he does not use illicit drugs.  Physical Exam: Vitals:  Today's Vitals   05/04/15 1302 05/04/15 1303  BP: 139/100   Pulse: 71   Temp: 98.2 F (36.8 C)   Resp: 18   Height:  (1.727 m)   Weight: 211 lb (95.709  kg)   SpO2: 100%   PainSc: 8  8   PainLoc: Neck   Calculated BMI: Body mass index is 32.09 kg/(m^2). General appearance: alert, cooperative, appears stated age, mild distress and mildly obese Eyes: conjunctivae/corneas clear. PERRL, EOM's intact. Fundi benign. Lungs: No evidence respiratory distress, no audible rales or ronchi and no use of accessory muscles of respiration Neck: no adenopathy, no carotid bruit, no JVD, supple, symmetrical, trachea midline and thyroid not enlarged, symmetric, no tenderness/mass/nodules Back: symmetric, no curvature. ROM normal. No CVA tenderness. Extremities: extremities normal, atraumatic, no cyanosis or edema. He denies any swelling, color changes, temperature changes, or nondermatomal pain. Pulses: 2+ and symmetric Skin: Skin color, texture, turgor normal. No rashes or lesions Neurologic: Grossly normal    Assessment: Encounter Diagnosis:  Primary Diagnosis: Chronic pain [G89.29]  Plan: [place] was seen today for neck pain and arm pain.  Diagnoses and all orders for this visit:  Chronic pain -     COMPLETE METABOLIC PANEL WITH GFR; Future -  C-reactive protein; Future -     Magnesium; Future -     Sedimentation rate; Future -     Vitamin D2,D3 Panel; Future -     tizanidine (ZANAFLEX) 2 MG capsule; Take 1 capsule (2 mg total) by mouth 3 (three) times daily as needed for muscle spasms.  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  Neurogenic pain  DDD (degenerative disc disease), cervical  Chronic radicular cervical pain  (right side)  Chronic right shoulder pain  Chronic pain of right elbow  Cervical spinal stenosis  Cervical foraminal stenosis (right side)  Myofascial pain  Muscle spasms of neck -     tizanidine (ZANAFLEX) 2 MG capsule; Take 1 capsule (2 mg total) by mouth 3 (three) times daily as needed for muscle spasms.  Vitamin D deficiency  History of tachycardia  Essential hypertension  History of head injury  Generalized anxiety disorder  Hyperlipidemia  Other orders -     oxycodone (OXY-IR) 5 MG capsule; Take 1 capsule (5 mg total) by mouth 5 (five) times daily as needed for pain.     There are no Patient Instructions on file for this visit. Medications discontinued today:  Medications Discontinued During This Encounter  Medication Reason  . lidocaine (LIDODERM) 5 % Error  . meloxicam (MOBIC) 7.5 MG tablet Error  . methocarbamol (ROBAXIN) 500 MG tablet Error  . naproxen (NAPROSYN) 375 MG tablet Error  . propranolol (INDERAL) 10 MG tablet Error  . gabapentin (NEURONTIN) 400 MG capsule Change in therapy  . oxycodone (OXY-IR) 5 MG capsule Reorder   Medications administered today:  Mr. Siwicki had no medications administered during this visit.  Primary Care Physician: No PCP Per Patient Location: [facility] Note by: Adri Schloss A. Laban Emperor, M.D, DABA, DABAPM, DABPM, DABIPP, FIPP

## 2015-05-04 NOTE — Progress Notes (Signed)
Safety precautions to be maintained throughout the outpatient stay will include: orient to surroundings, keep bed in low position, maintain call bell within reach at all times, provide assistance with transfer out of bed and ambulation. Pill count.  Did not bring pills but states he is out.

## 2015-05-09 NOTE — Telephone Encounter (Signed)
error 

## 2015-05-10 ENCOUNTER — Ambulatory Visit: Payer: Medicare Other | Admitting: Pain Medicine

## 2015-05-12 LAB — TOXASSURE SELECT 13 (MW), URINE: PDF: 0

## 2015-05-16 NOTE — Progress Notes (Signed)
Quick Note:  The results of this test were unexpected. No therapeutic levels of the prescribed medication were found. Results will need to be reviewed with patient. Possible causes include: PRN use; increased intake with early depletion of medication; or opioid deveation. We will be paying attention to pill counts to assist with diferential. ______ 

## 2015-06-02 ENCOUNTER — Encounter: Payer: Self-pay | Admitting: Pain Medicine

## 2015-06-02 ENCOUNTER — Ambulatory Visit: Payer: Medicare Other | Attending: Pain Medicine | Admitting: Pain Medicine

## 2015-06-02 ENCOUNTER — Other Ambulatory Visit: Payer: Self-pay | Admitting: Pain Medicine

## 2015-06-02 VITALS — BP 130/112 | HR 66 | Temp 98.2°F | Resp 18 | Ht 68.0 in | Wt 212.0 lb

## 2015-06-02 DIAGNOSIS — M47812 Spondylosis without myelopathy or radiculopathy, cervical region: Secondary | ICD-10-CM | POA: Diagnosis not present

## 2015-06-02 DIAGNOSIS — F1120 Opioid dependence, uncomplicated: Secondary | ICD-10-CM

## 2015-06-02 DIAGNOSIS — F1190 Opioid use, unspecified, uncomplicated: Secondary | ICD-10-CM | POA: Diagnosis not present

## 2015-06-02 DIAGNOSIS — E785 Hyperlipidemia, unspecified: Secondary | ICD-10-CM | POA: Diagnosis not present

## 2015-06-02 DIAGNOSIS — M7918 Myalgia, other site: Secondary | ICD-10-CM

## 2015-06-02 DIAGNOSIS — I10 Essential (primary) hypertension: Secondary | ICD-10-CM | POA: Diagnosis not present

## 2015-06-02 DIAGNOSIS — M542 Cervicalgia: Secondary | ICD-10-CM | POA: Diagnosis present

## 2015-06-02 DIAGNOSIS — M6248 Contracture of muscle, other site: Secondary | ICD-10-CM

## 2015-06-02 DIAGNOSIS — Z981 Arthrodesis status: Secondary | ICD-10-CM | POA: Diagnosis not present

## 2015-06-02 DIAGNOSIS — G8929 Other chronic pain: Secondary | ICD-10-CM | POA: Insufficient documentation

## 2015-06-02 DIAGNOSIS — M791 Myalgia: Secondary | ICD-10-CM

## 2015-06-02 DIAGNOSIS — M25521 Pain in right elbow: Secondary | ICD-10-CM | POA: Insufficient documentation

## 2015-06-02 DIAGNOSIS — Z79891 Long term (current) use of opiate analgesic: Secondary | ICD-10-CM

## 2015-06-02 DIAGNOSIS — M79603 Pain in arm, unspecified: Secondary | ICD-10-CM | POA: Insufficient documentation

## 2015-06-02 DIAGNOSIS — Z9889 Other specified postprocedural states: Secondary | ICD-10-CM | POA: Insufficient documentation

## 2015-06-02 DIAGNOSIS — Z5181 Encounter for therapeutic drug level monitoring: Secondary | ICD-10-CM

## 2015-06-02 DIAGNOSIS — Z79899 Other long term (current) drug therapy: Secondary | ICD-10-CM

## 2015-06-02 DIAGNOSIS — M4802 Spinal stenosis, cervical region: Secondary | ICD-10-CM | POA: Diagnosis not present

## 2015-06-02 DIAGNOSIS — E559 Vitamin D deficiency, unspecified: Secondary | ICD-10-CM | POA: Insufficient documentation

## 2015-06-02 DIAGNOSIS — M62838 Other muscle spasm: Secondary | ICD-10-CM | POA: Insufficient documentation

## 2015-06-02 DIAGNOSIS — M792 Neuralgia and neuritis, unspecified: Secondary | ICD-10-CM

## 2015-06-02 MED ORDER — TIZANIDINE HCL 2 MG PO CAPS: 2.0000 mg | ORAL_CAPSULE | Freq: Three times a day (TID) | ORAL | Status: DC | PRN

## 2015-06-02 MED ORDER — OXYCODONE HCL 5 MG PO CAPS: 5.0000 mg | ORAL_CAPSULE | Freq: Every day | ORAL | Status: DC | PRN

## 2015-06-02 MED ORDER — GABAPENTIN 800 MG PO TABS: 800.0000 mg | ORAL_TABLET | Freq: Four times a day (QID) | ORAL | Status: DC

## 2015-06-02 NOTE — Progress Notes (Signed)
Oxycodone pill count #0

## 2015-06-02 NOTE — Progress Notes (Signed)
Safety precautions to be maintained throughout the outpatient stay will include: orient to surroundings, keep bed in low position, maintain call bell within reach at all times, provide assistance with transfer out of bed and ambulation.  

## 2015-06-06 NOTE — Progress Notes (Signed)
Patient's Name: Jared Bass MRN: [id number] DOB: 1971-02-17 DOS: 06/02/2015  Primary Reason(s) for Visit: Encounter for Medication Management CC: Neck Pain and Arm Pain   HPI:   Jared Bass is a 44 y.o. year old, male patient, who returns today as an established patient. 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); Long term current use of opiate analgesic; Long term prescription opiate use; Opiate use; Opiate dependence (HCC); Encounter for therapeutic drug level monitoring; Neuropathic pain; Neurogenic pain; DDD (degenerative disc disease), cervical; Chronic radicular cervical pain (right side); Chronic right shoulder pain; Chronic pain of right elbow; Cervical spinal stenosis; Cervical foraminal stenosis (right side); Myofascial pain; Muscle spasms of neck; Vitamin D deficiency; History of tachycardia; Essential hypertension; History of head injury; Generalized anxiety disorder; and Hyperlipidemia on his problem list.. His primarily concern today is the Neck Pain and Arm Pain     The patient returns today after having had his medications adjusted. Currently, the patient seems to be doing well on his medication regimen.  Today's Pain Score: 5 , clinically he looks more like a 1-2/10. Reported level of pain is incompatible with clinical obrservations. This may be secondary to a possible lack of understanding on how the pain scale works. Pain Type: Chronic pain Pain Location: Neck (arm) Pain Orientation: Right Pain Descriptors / Indicators: Numbness, Tingling, Aching, Burning, Sharp Pain Frequency: Constant  Date of Last Visit: 05/04/15 Service Provided on Last Visit: Med Refill  Pharmacotherapy Review:   Side-effects or Adverse reactions: None reported. Effectiveness: Described as relatively effective, allowing for  increase in activities of daily living (ADL). Onset of action: Within expected pharmacological parameters. Duration of action: Within normal limits for medication. Peak effect: Timing and results are as within normal expected parameters. [place] PMP: Compliant with practice rules and regulations. UDS Results:  Lab Results  Component Value Date   THCU NONE DETECTED 08/04/2008   COCAINSCRNUR NONE DETECTED 08/04/2008   AMPHETMU NONE DETECTED 08/04/2008     UDS Interpretation: Patient appears to be compliant with practice rules and regulations Medication Assessment Form: Reviewed. Patient indicates being compliant with therapy Treatment compliance: Compliant Substance Use Disorder (SUD) Risk Level: Low Pharmacologic Plan: Continue therapy as is  Last Available Lab Work: Orders Only on 05/04/2015  Component Date Value Ref Range Status  . Report Summary 05/04/2015 FINAL   Final  . PDF 05/04/2015 .   Final   Allergies: Jared Bass is allergic to tramadol and other.  Meds: The patient has a current medication list which includes the following prescription(s): oxycodone, propranolol, tizanidine, gabapentin, oxycodone, and oxycodone. Requested Prescriptions   Signed Prescriptions Disp Refills  . tizanidine (ZANAFLEX) 2 MG capsule 90 capsule 2    Sig: Take 1 capsule (2 mg total) by mouth 3 (three) times daily as needed for muscle spasms.  Marland Kitchen oxycodone (OXY-IR) 5 MG capsule 150 capsule 0    Sig: Take 1 capsule (5 mg total) by mouth 5 (five) times daily as needed for pain.  Marland Kitchen oxycodone (OXY-IR) 5 MG capsule 150 capsule 0    Sig: Take 1 capsule (5 mg total) by mouth 5 (five) times daily as needed for pain.  Marland Kitchen oxycodone (OXY-IR) 5 MG capsule 150 capsule 0    Sig: Take 1 capsule (5 mg total) by mouth 5 (five) times daily as needed for pain.  Marland Kitchen gabapentin (NEURONTIN) 800 MG tablet  120 tablet 2    Sig: Take 1 tablet (800 mg total) by mouth every 6 (six) hours.    ROS: Constitutional: Afebrile, no  chills, well hydrated and well nourished Gastrointestinal: negative Musculoskeletal:negative Neurological: negative Behavioral/Psych: negative  PFSH: Medical:  Jared Bass  has a past medical history of Chronic neck pain; Right eye injury; Hypertension; History of tachycardia (05/04/2015); and History of head injury (05/04/2015). Family: family history includes COPD in his father and mother; Cancer in his father; Hypertension in his mother. Surgical:  has past surgical history that includes neck fusion. Tobacco:  reports that he has never smoked. He does not have any smokeless tobacco history on file. Alcohol:  reports that he does not drink alcohol. Drug:  reports that he does not use illicit drugs.  Physical Exam: Vitals:  Today's Vitals   06/02/15 1415 06/02/15 1417  BP:  130/112  Pulse: 66   Temp: 98.2 F (36.8 C)   Resp: 18   Height:  (1.727 m)   Weight: 212 lb (96.163 kg)   SpO2: 100%   PainSc: 5  5   PainLoc: Neck   Calculated BMI: Body mass index is 32.24 kg/(m^2). General appearance: alert, cooperative, appears stated age and no distress Eyes: PERLA Respiratory: No evidence respiratory distress, no audible rales or ronchi and no use of accessory muscles of respiration Neck: no adenopathy, no carotid bruit, no JVD, supple, symmetrical, trachea midline and thyroid not enlarged, symmetric, no tenderness/mass/nodules  Cervical Spine ROM: Some decreased range of motion of cervical spine due to the cervical spondylosis and foraminal stenosis. Palpation: No palpable trigger points  Upper Extremities ROM: Adequate bilaterally Strength: 5/5 for all flexors and extensors of the upper extremity, bilaterally Pulses: Palpable bilaterally Neurologic: No allodynia, No hyperesthesia, No hyperpathia and No sensory abnormalities  Lumbar Spine ROM: Adequate for flexion, extension, rotation, and lateral bending Palpation: No palpable trigger points Lumbar Hyperextension and  rotation: Non-contributory Patrick's Maneuver: Non-contributory  Lower Extremities ROM: Adequate bilaterally Strength: 5/5 for all flexors and extensors of the lower extremity, bilaterally Pulses: Palpable bilaterally Neurologic: No allodynia, No hyperesthesia, No hyperpathia, No sensory abnormalities and No antalgic gait or posture  Assessment: Encounter Diagnosis:  Primary Diagnosis: Chronic pain [G89.29]  Plan: Interventional: None planned at this time.  Jared Bass was seen today for neck pain and arm pain.  Diagnoses and all orders for this visit:  Chronic pain -     tizanidine (ZANAFLEX) 2 MG capsule; Take 1 capsule (2 mg total) by mouth 3 (three) times daily as needed for muscle spasms. -     oxycodone (OXY-IR) 5 MG capsule; Take 1 capsule (5 mg total) by mouth 5 (five) times daily as needed for pain. -     oxycodone (OXY-IR) 5 MG capsule; Take 1 capsule (5 mg total) by mouth 5 (five) times daily as needed for pain. -     oxycodone (OXY-IR) 5 MG capsule; Take 1 capsule (5 mg total) by mouth 5 (five) times daily as needed for pain. -     gabapentin (NEURONTIN) 800 MG tablet; Take 1 tablet (800 mg total) by mouth every 6 (six) hours.  Cervical foraminal stenosis (right side)  Chronic neck pain  Chronic pain of right elbow  Encounter for therapeutic drug level monitoring  Long term current use of opiate analgesic  Long term prescription opiate use  Uncomplicated opioid dependence (HCC)  Opiate use  Muscle spasms of neck -     tizanidine (ZANAFLEX) 2 MG capsule;  Take 1 capsule (2 mg total) by mouth 3 (three) times daily as needed for muscle spasms.  Neuropathic pain -     gabapentin (NEURONTIN) 800 MG tablet; Take 1 tablet (800 mg total) by mouth every 6 (six) hours.  Neurogenic pain -     gabapentin (NEURONTIN) 800 MG tablet; Take 1 tablet (800 mg total) by mouth every 6 (six) hours.  Myofascial pain     There are no Patient Instructions on file for this  visit. Medications discontinued today:  Medications Discontinued During This Encounter  Medication Reason  . tizanidine (ZANAFLEX) 2 MG capsule Reorder  . oxycodone (OXY-IR) 5 MG capsule Reorder  . gabapentin (NEURONTIN) 400 MG capsule Duplicate   Medications administered today:  Jared Bass had no medications administered during this visit.  Primary Care Physician: No PCP Per Patient Location: [facility] Note by: Percilla Tweten A. Laban Emperor, M.D, DABA, DABAPM, DABPM, DABIPP, FIPP

## 2015-06-07 ENCOUNTER — Other Ambulatory Visit: Payer: Self-pay | Admitting: Pain Medicine

## 2015-06-09 LAB — TOXASSURE SELECT 13 (MW), URINE: PDF: 0

## 2015-08-31 ENCOUNTER — Ambulatory Visit: Payer: Medicare Other | Attending: Pain Medicine | Admitting: Pain Medicine

## 2015-08-31 ENCOUNTER — Encounter: Payer: Self-pay | Admitting: Pain Medicine

## 2015-08-31 VITALS — BP 148/103 | HR 72 | Temp 98.2°F | Resp 16 | Ht 68.0 in | Wt 215.0 lb

## 2015-08-31 DIAGNOSIS — I10 Essential (primary) hypertension: Secondary | ICD-10-CM | POA: Diagnosis not present

## 2015-08-31 DIAGNOSIS — M79602 Pain in left arm: Secondary | ICD-10-CM

## 2015-08-31 DIAGNOSIS — Z981 Arthrodesis status: Secondary | ICD-10-CM | POA: Diagnosis not present

## 2015-08-31 DIAGNOSIS — G8929 Other chronic pain: Secondary | ICD-10-CM | POA: Insufficient documentation

## 2015-08-31 DIAGNOSIS — E785 Hyperlipidemia, unspecified: Secondary | ICD-10-CM | POA: Insufficient documentation

## 2015-08-31 DIAGNOSIS — Z5181 Encounter for therapeutic drug level monitoring: Secondary | ICD-10-CM

## 2015-08-31 DIAGNOSIS — H3530 Unspecified macular degeneration: Secondary | ICD-10-CM | POA: Diagnosis not present

## 2015-08-31 DIAGNOSIS — M791 Myalgia: Secondary | ICD-10-CM | POA: Diagnosis not present

## 2015-08-31 DIAGNOSIS — Z79891 Long term (current) use of opiate analgesic: Secondary | ICD-10-CM | POA: Insufficient documentation

## 2015-08-31 DIAGNOSIS — M542 Cervicalgia: Secondary | ICD-10-CM | POA: Diagnosis not present

## 2015-08-31 DIAGNOSIS — M4802 Spinal stenosis, cervical region: Secondary | ICD-10-CM | POA: Diagnosis not present

## 2015-08-31 DIAGNOSIS — R51 Headache: Secondary | ICD-10-CM | POA: Diagnosis not present

## 2015-08-31 DIAGNOSIS — M5010 Cervical disc disorder with radiculopathy, unspecified cervical region: Secondary | ICD-10-CM | POA: Diagnosis not present

## 2015-08-31 DIAGNOSIS — M25511 Pain in right shoulder: Secondary | ICD-10-CM | POA: Insufficient documentation

## 2015-08-31 DIAGNOSIS — F1190 Opioid use, unspecified, uncomplicated: Secondary | ICD-10-CM

## 2015-08-31 DIAGNOSIS — M5412 Radiculopathy, cervical region: Secondary | ICD-10-CM | POA: Insufficient documentation

## 2015-08-31 DIAGNOSIS — M79603 Pain in arm, unspecified: Secondary | ICD-10-CM

## 2015-08-31 DIAGNOSIS — M79601 Pain in right arm: Secondary | ICD-10-CM

## 2015-08-31 DIAGNOSIS — E559 Vitamin D deficiency, unspecified: Secondary | ICD-10-CM | POA: Insufficient documentation

## 2015-08-31 MED ORDER — OXYCODONE HCL 5 MG PO TABS: 5.0000 mg | ORAL_TABLET | Freq: Every day | ORAL | Status: DC | PRN

## 2015-08-31 MED ORDER — OXYCODONE HCL 5 MG PO TABS
5.0000 mg | ORAL_TABLET | Freq: Every day | ORAL | Status: DC | PRN
Start: 2015-08-31 — End: 2015-11-30

## 2015-08-31 NOTE — Progress Notes (Signed)
Patient's Name: Jared Bass MRN: [id number] DOB: 24-Nov-1970 DOS: 08/31/2015  Primary Reason(s) for Visit: Encounter for Medication Management CC: Neck Pain   HPI  Jared Bass is a 45 y.o. year old, male patient, who returns today as an established patient. 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); Long term current use of opiate analgesic; Long term prescription opiate use; Opiate use (37.5 MME/Day); Opiate dependence (HCC); Encounter for therapeutic drug level monitoring; Neuropathic pain; Neurogenic pain; Chronic cervical radicular pain (Location of Primary Source of Pain) (Bilateral) (R>L) (C6 and C7 Dermatome); Chronic shoulder pain (Right); Chronic elbow pain (Right); Cervical spinal stenosis; Cervical foraminal stenosis (right side); Myofascial pain; Muscle spasms of neck; Vitamin D deficiency; History of tachycardia; Essential hypertension; History of head injury; Generalized anxiety disorder; Hyperlipidemia; Chronic upper extremity pain (Location of Primary Source of Pain) (Bilateral) (R>L); and Radicular pain of shoulder (Right) on his problem list.. His primarily concern today is the Neck Pain   The patient comes into clinic today for pharmacological management of his chronic pain. He indicates that his primary pain is the upper extremities with the right side being worst on the left. In the case of the right upper extremity the pain goes to his thumb and index finger following a C6/C7 dermatomal distribution. He indicates having some numbness and weakness in that upper extremity. In the case of the left upper extremity it is a burning sensation, which is usually control with the gabapentin.  His second source of pain is that of the neck with the right side being worse than the left. The pain is located in the posterior aspect  of the neck.  Occasionally he will have frontal headaches but he indicates that this are related to his physician since he has a cataract on his right eye.  Finally, he also indicates having some low back pain in the center of the lower back but this is intermittent and its associated with work.  Pain Assessment: Self-Reported Pain Score: 3  (last pain medicine 0800 this a.m.) Reported level is compatible with observation Pain Type: Chronic pain Pain Location: Neck Pain Orientation: Right Pain Descriptors / Indicators: Burning, Throbbing, Sharp, Constant Pain Frequency: Constant   Controlled Substance Pharmacotherapy Assessment  Analgesic: Oxycodone IR 5 mg 5 tablets per day (25 mg/day) MME/day: 37.5 mg/day Pharmacokinetics: Onset of action (Liberation/Absorption): Within expected pharmacological parameters. (30-40 minutes) Time to Peak effect (Distribution): Timing and results are as within normal expected parameters. (2-3 hours) Duration of action (Metabolism/Excretion): Within normal limits for medication. (4-5 hours) Pharmacodynamics: Analgesic Effect: 90% Activity Facilitation: Medication(s) allow patient to sit, stand, walk, and do the basic ADLs Perceived Effectiveness: Described as relatively effective, allowing for increase in activities of daily living (ADL) Side-effects or Adverse reactions: None reported Monitoring: [place] PMP: Compliant with practice rules and regulations UDS Results/interpretation: The patient's last UDS was done on 06/02/2015 and it came back within normal limits and no unexpected results. Medication Assessment Form: Reviewed. Patient indicates being compliant with therapy Treatment compliance: Compliant Risk Assessment: Aberrant Behavior: None at this time. Substance Use Disorder (SUD) Risk Level: Low Opioid Risk Tool (ORT) Score: Total Score: 3 Low Risk for SUD (Score <3) Depression Scale Score:    Pharmacologic Plan: Continue therapy as is  Lab  Work: Illicit Drugs Lab Results  Component Value Date   THCU NONE DETECTED 08/04/2008  COCAINSCRNUR NONE DETECTED 08/04/2008   AMPHETMU NONE DETECTED 08/04/2008    Inflammation Markers No results found for: ESRSEDRATE, CRP  Renal Function Lab Results  Component Value Date   BUN 6 08/04/2008   CREATININE 1.1 08/04/2008    Hepatic Function Lab Results  Component Value Date   AST 22 08/04/2008   ALT 18 08/04/2008   ALBUMIN 4.1 08/04/2008    Electrolytes Lab Results  Component Value Date   NA 146* 08/04/2008   K 3.4* 08/04/2008   CL 106 08/04/2008   CALCIUM 9.5 12/29/2007    Allergies  Jared Bass is allergic to tramadol and other.  Meds  The patient has a current medication list which includes the following prescription(s): gabapentin, propranolol, tizanidine, oxycodone, oxycodone, and oxycodone.  Current Outpatient Prescriptions on File Prior to Visit  Medication Sig  . gabapentin (NEURONTIN) 800 MG tablet Take 1 tablet (800 mg total) by mouth every 6 (six) hours.  . propranolol (INDERAL) 20 MG tablet Take 20 mg by mouth 3 (three) times daily.  . tizanidine (ZANAFLEX) 2 MG capsule Take 1 capsule (2 mg total) by mouth 3 (three) times daily as needed for muscle spasms.   No current facility-administered medications on file prior to visit.    ROS  Constitutional: Afebrile, no chills, well hydrated and well nourished Gastrointestinal: negative Musculoskeletal:negative Neurological: negative Behavioral/Psych: negative  PFSH  Medical:  Jared Bass  has a past medical history of Chronic neck pain; Right eye injury; Hypertension; History of tachycardia (05/04/2015); History of head injury (05/04/2015); and DDD (degenerative disc disease), cervical (05/04/2015). Family: family history includes COPD in his father and mother; Cancer in his father; Hypertension in his mother. Surgical:  has past surgical history that includes neck fusion. Tobacco:  reports that he has never  smoked. He does not have any smokeless tobacco history on file. Alcohol:  reports that he does not drink alcohol. Drug:  reports that he does not use illicit drugs.  Physical Exam  Vitals:  Today's Vitals   08/31/15 1131  BP: 148/103  Pulse: 72  Temp: 98.2 F (36.8 C)  TempSrc: Oral  Resp: 16  Height: 5\' 8"  (1.727 m)  Weight: 215 lb (97.523 kg)  SpO2: 100%  PainSc: 3     Calculated BMI: Body mass index is 32.7 kg/(m^2).  General appearance: alert, cooperative, appears stated age and no distress Eyes: PERLA Respiratory: No evidence respiratory distress, no audible rales or ronchi and no use of accessory muscles of respiration  Cervical Spine Inspection: Normal anatomy Alignment: Symetrical ROM: Decreased range of motion for the cervical spine.  Upper Extremities Inspection: No gross anomalies detected ROM: Decreased range of motion for both shoulders. Sensory: Normal Motor: Unremarkable  Thoracic Spine Inspection: No gross anomalies detected Alignment: Symetrical ROM: Adequate  Lumbar Spine Inspection: No gross anomalies detected Alignment: Symetrical ROM: Adequate  Gait: WNL  Lower Extremities Inspection: No gross anomalies detected ROM: Adequate Sensory:  Normal Motor: Unremarkable  Assessment & Plan  Primary Diagnosis & Pertinent Problem List: The primary encounter diagnosis was Chronic pain. Diagnoses of Encounter for therapeutic drug level monitoring, Long term current use of opiate analgesic, Vitamin D deficiency, Chronic radicular cervical pain (right side), Chronic pain of both upper extremities, Radicular pain of shoulder (Right), and Opiate use (37.5 MME/Day) were also pertinent to this visit.  Visit Diagnosis: 1. Chronic pain   2. Encounter for therapeutic drug level monitoring   3. Long term current use of opiate analgesic   4. Vitamin  D deficiency   5. Chronic radicular cervical pain (right side)   6. Chronic pain of both upper extremities    7. Radicular pain of shoulder (Right)   8. Opiate use (37.5 MME/Day)     Problem-specific Plan(s): No problem-specific assessment & plan notes found for this encounter.   Plan of Care  Pharmacotherapy (Medications Ordered): Meds ordered this encounter  Medications  . oxyCODONE (OXY IR/ROXICODONE) 5 MG immediate release tablet    Sig: Take 1 tablet (5 mg total) by mouth 5 (five) times daily as needed for moderate pain or severe pain.    Dispense:  150 tablet    Refill:  0    Do not place this medication, or any other prescription from our practice, on "Automatic Refill". Patient may have prescription filled one day early if pharmacy is closed on scheduled refill date. Do not fill until: 08/31/15 To last until:09/30/15  . oxyCODONE (OXY IR/ROXICODONE) 5 MG immediate release tablet    Sig: Take 1 tablet (5 mg total) by mouth 5 (five) times daily as needed for moderate pain or severe pain.    Dispense:  150 tablet    Refill:  0    Do not place this medication, or any other prescription from our practice, on "Automatic Refill". Patient may have prescription filled one day early if pharmacy is closed on scheduled refill date. Do not fill until: 09/30/15 To last until: 10/30/15  . oxyCODONE (OXY IR/ROXICODONE) 5 MG immediate release tablet    Sig: Take 1 tablet (5 mg total) by mouth 5 (five) times daily as needed for moderate pain or severe pain.    Dispense:  150 tablet    Refill:  0    Do not place this medication, or any other prescription from our practice, on "Automatic Refill". Patient may have prescription filled one day early if pharmacy is closed on scheduled refill date. Do not fill until: 10/30/15 To last until: 11/29/15    [facility] & Procedure Ordered: Orders Placed This Encounter  Procedures  . CERVICAL EPIDURAL STEROID INJECTION    Standing Status: Standing     Number of Occurrences: 1     Standing Expiration Date: 08/30/2016    Scheduling Instructions:     Side:  Right-sided     Sedation: With Sedation.     Timeframe: PRN Procedure. Patient will call to schedule.     Indication: Neck pain with or without upper extremity pain, numbness, or weakness. Cervical Spondylosis with or without cervical radicular pain.    Order Specific Question:  Where will this procedure be performed?    Answer:  [facility]  . ToxASSURE Select 13 (MW), Urine    Volume: 30 ml(s). Minimum 3 ml of urine is needed. Document temperature of fresh sample. Indications: Long term (current) use of opiate analgesic (Z79.891)  . Comprehensive metabolic panel    Order Specific Question:  Has the patient fasted?    Answer:  No  . C-reactive protein  . Magnesium  . Sedimentation rate  . Vitamin B12    Indication: Bone Pain (M89.9)  . Vitamin D pnl(25-hydrxy+1,25-dihy)-bld    Imaging Ordered: None  Interventional Therapies: Scheduled: None at this time. PRN Procedures: Right sided cervical epidural steroid injection under fluoroscopic guidance and IV sedation.    Referral(s) or Consult(s): None at this time, but the patient is considering to proceed with a surgery of his cervical spine (third one).  Medications administered during this visit: Mr. Makarewicz had no  medications administered during this visit.  Future Appointments Date Time Provider Department Center  08/31/2015 1:40 PM Delano Metz, MD [facility] None  11/30/2015 1:00 PM Delano Metz, MD [facility] None    Primary Care Physician: Sherrie Mustache, MD Location: [facility] Facility Note by: Sydnee Levans. Laban Emperor, M.D, DABA, DABAPM, DABPM, DABIPP, FIPP  Pain Score Disclaimer: We use the NRS-11 scale. This is a self-reported, subjective measurement of pain severity with only modest accuracy. It is used primarily to identify changes within a particular patient. It must be understood that outpatient pain scales are significantly less accurate that those used for research, where they can  be applied under ideal controlled circumstances with minimal exposure to variables. In reality, the score is likely to be a combination of pain intensity and pain affect, where pain affect describes the degree of emotional arousal or changes in action readiness caused by the sensory experience of pain. Factors such as social and work situation, setting, emotional state, anxiety levels, expectation, and prior pain experience may influence pain perception and show large inter-individual differences that may also be affected by time variables.

## 2015-08-31 NOTE — Progress Notes (Signed)
Patient here for medication refill for oxycodone and gabapentin.  Patient has a question about gabapentin and the affects on the brain.  Verbalizes no other c/o.  Patient presents empty bottle for oxycodone 5 mg, last fill 08/03/15.

## 2015-08-31 NOTE — Patient Instructions (Signed)
Epidural Steroid Injection Patient Information  Description: The epidural space surrounds the nerves as they exit the spinal cord.  In some patients, the nerves can be compressed and inflamed by a bulging disc or a tight spinal canal (spinal stenosis).  By injecting steroids into the epidural space, we can bring irritated nerves into direct contact with a potentially helpful medication.  These steroids act directly on the irritated nerves and can reduce swelling and inflammation which often leads to decreased pain.  Epidural steroids may be injected anywhere along the spine and from the neck to the low back depending upon the location of your pain.   After numbing the skin with local anesthetic (like Novocaine), a small needle is passed into the epidural space slowly.  You may experience a sensation of pressure while this is being done.  The entire block usually last less than 10 minutes.  Conditions which may be treated by epidural steroids:   Low back and leg pain  Neck and arm pain  Spinal stenosis  Post-laminectomy syndrome  Herpes zoster (shingles) pain  Pain from compression fractures  Preparation for the injection:  1. Do not eat any solid food or dairy products within 6 hours of your appointment.  2. You may drink clear liquids up to 2 hours before appointment.  Clear liquids include water, black coffee, juice or soda.  No milk or cream please. 3. You may take your regular medication, including pain medications, with a sip of water before your appointment  Diabetics should hold regular insulin (if taken separately) and take 1/2 normal NPH dos the morning of the procedure.  Carry some sugar containing items with you to your appointment. 4. A driver must accompany you and be prepared to drive you home after your procedure.  5. Bring all your current medications with your. 6. An IV may be inserted and sedation may be given at the discretion of the physician.   7. A blood pressure  cuff, EKG and other monitors will often be applied during the procedure.  Some patients may need to have extra oxygen administered for a short period. 8. You will be asked to provide medical information, including your allergies, prior to the procedure.  We must know immediately if you are taking blood thinners (like Coumadin/Warfarin)  Or if you are allergic to IV iodine contrast (dye). We must know if you could possible be pregnant.  Possible side-effects:  Bleeding from needle site  Infection (rare, may require surgery)  Nerve injury (rare)  Numbness & tingling (temporary)  Difficulty urinating (rare, temporary)  Spinal headache ( a headache worse with upright posture)  Light -headedness (temporary)  Pain at injection site (several days)  Decreased blood pressure (temporary)  Weakness in arm/leg (temporary)  Pressure sensation in back/neck (temporary)  Call if you experience:  Fever/chills associated with headache or increased back/neck pain.  Headache worsened by an upright position.  New onset weakness or numbness of an extremity below the injection site  Hives or difficulty breathing (go to the emergency room)  Inflammation or drainage at the infection site  Severe back/neck pain  Any new symptoms which are concerning to you  Please note:  Although the local anesthetic injected can often make your back or neck feel good for several hours after the injection, the pain will likely return.  It takes 3-7 days for steroids to work in the epidural space.  You may not notice any pain relief for at least that one week.    If effective, we will often do a series of three injections spaced 3-6 weeks apart to maximally decrease your pain.  After the initial series, we generally will wait several months before considering a repeat injection of the same type.  If you have any questions, please call [phone] [facility] AND  COMPLICATIONS  What are the risk, side effects and possible complications? Generally speaking, most procedures are safe.  However, with any procedure there are risks, side effects, and the possibility of complications.  The risks and complications are dependent upon the sites that are lesioned, or the type of nerve block to be performed.  The closer the procedure is to the spine, the more serious the risks are.  Great care is taken when placing the radio frequency needles, block needles or lesioning probes, but sometimes complications can occur. 1. Infection: Any time there is an injection through the skin, there is a risk of infection.  This is why sterile conditions are used for these blocks.  There are four possible types of infection. 1. Localized skin infection. 2. Central Nervous System Infection-This can be in the form of Meningitis, which can be deadly. 3. Epidural Infections-This can be in the form of an epidural abscess, which can cause pressure inside of the spine, causing compression of the spinal cord with subsequent paralysis. This would require an emergency surgery to decompress, and there are no guarantees that the patient would recover from the paralysis. 4. Discitis-This is an infection of the intervertebral discs.  It occurs in about 1% of discography procedures.  It is difficult to treat and it may lead to surgery.        2. Pain: the needles have to go through skin and soft tissues, will cause soreness.       3. Damage to internal structures:  The nerves to be lesioned may be near blood vessels or    other nerves which can be potentially damaged.       4. Bleeding: Bleeding is more common if the patient is taking blood thinners such as  aspirin, Coumadin, Ticiid, Plavix, etc., or if he/she have some genetic predisposition  such as hemophilia. Bleeding into the spinal canal can cause compression of the spinal  cord with subsequent paralysis.  This would require an emergency surgery  to  decompress and there are no guarantees that the patient would recover from the  paralysis.       5. Pneumothorax:  Puncturing of a lung is a possibility, every time a needle is introduced in  the area of the chest or upper back.  Pneumothorax refers to free air around the  collapsed lung(s), inside of the thoracic cavity (chest cavity).  Another two possible  complications related to a similar event would include: Hemothorax and Chylothorax.   These are variations of the Pneumothorax, where instead of air around the collapsed  lung(s), you may have blood or chyle, respectively.       6. Spinal headaches: They may occur with any procedures in the area of the spine.       7. Persistent CSF (Cerebro-Spinal Fluid) leakage: This is a rare problem, but may occur  with prolonged intrathecal or epidural catheters either due to the formation of a fistulous  track or a dural tear.       8. Nerve damage: By working so close to the spinal cord, there is always a possibility of  nerve damage, which could be   as serious as a permanent spinal cord injury with  paralysis.       9. Death:  Although rare, severe deadly allergic reactions known as "Anaphylactic  reaction" can occur to any of the medications used.      10. Worsening of the symptoms:  We can always make thing worse.  What are the chances of something like this happening? Chances of any of this occuring are extremely low.  By statistics, you have more of a chance of getting killed in a motor vehicle accident: while driving to the hospital than any of the above occurring .  Nevertheless, you should be aware that they are possibilities.  In general, it is similar to taking a shower.  Everybody knows that you can slip, hit your head and get killed.  Does that mean that you should not shower again?  Nevertheless always keep in mind that statistics do not mean anything if you happen to be on the wrong side of them.  Even if a procedure has a 1 (one) in a 1,000,000  (million) chance of going wrong, it you happen to be that one..Also, keep in mind that by statistics, you have more of a chance of having something go wrong when taking medications.  Who should not have this procedure? If you are on a blood thinning medication (e.g. Coumadin, Plavix, see list of "Blood Thinners"), or if you have an active infection going on, you should not have the procedure.  If you are taking any blood thinners, please inform your physician.  How should I prepare for this procedure?  Do not eat or drink anything at least six hours prior to the procedure.  Bring a driver with you .  It cannot be a taxi.  Come accompanied by an adult that can drive you back, and that is strong enough to help you if your legs get weak or numb from the local anesthetic.  Take all of your medicines the morning of the procedure with just enough water to swallow them.  If you have diabetes, make sure that you are scheduled to have your procedure done first thing in the morning, whenever possible.  If you have diabetes, take only half of your insulin dose and notify our nurse that you have done so as soon as you arrive at the clinic.  If you are diabetic, but only take blood sugar pills (oral hypoglycemic), then do not take them on the morning of your procedure.  You may take them after you have had the procedure.  Do not take aspirin or any aspirin-containing medications, at least eleven (11) days prior to the procedure.  They may prolong bleeding.  Wear loose fitting clothing that may be easy to take off and that you would not mind if it got stained with Betadine or blood.  Do not wear any jewelry or perfume  Remove any nail coloring.  It will interfere with some of our monitoring equipment.  NOTE: Remember that this is not meant to be interpreted as a complete list of all possible complications.  Unforeseen problems may occur.  BLOOD THINNERS The following drugs contain aspirin or other  products, which can cause increased bleeding during surgery and should not be taken for 2 weeks prior to and 1 week after surgery.  If you should need take something for relief of minor pain, you may take acetaminophen which is found in Tylenol,m Datril, Anacin-3 and Panadol. It is not blood thinner. The products listed below   are.  Do not take any of the products listed below in addition to any listed on your instruction sheet.  A.P.C or A.P.C with Codeine Codeine Phosphate Capsules #3 Ibuprofen Ridaura  ABC compound Congesprin Imuran rimadil  Advil Cope Indocin Robaxisal  Alka-Seltzer Effervescent Pain Reliever and Antacid Coricidin or Coricidin-D  Indomethacin Rufen  Alka-Seltzer plus Cold Medicine Cosprin Ketoprofen S-A-C Tablets  Anacin Analgesic Tablets or Capsules Coumadin Korlgesic Salflex  Anacin Extra Strength Analgesic tablets or capsules CP-2 Tablets Lanoril Salicylate  Anaprox Cuprimine Capsules Levenox Salocol  Anexsia-D Dalteparin Magan Salsalate  Anodynos Darvon compound Magnesium Salicylate Sine-off  Ansaid Dasin Capsules Magsal Sodium Salicylate  Anturane Depen Capsules Marnal Soma  APF Arthritis pain formula Dewitt's Pills Measurin Stanback  Argesic Dia-Gesic Meclofenamic Sulfinpyrazone  Arthritis Bayer Timed Release Aspirin Diclofenac Meclomen Sulindac  Arthritis pain formula Anacin Dicumarol Medipren Supac  Analgesic (Safety coated) Arthralgen Diffunasal Mefanamic Suprofen  Arthritis Strength Bufferin Dihydrocodeine Mepro Compound Suprol  Arthropan liquid Dopirydamole Methcarbomol with Aspirin Synalgos  ASA tablets/Enseals Disalcid Micrainin Tagament  Ascriptin Doan's Midol Talwin  Ascriptin A/D Dolene Mobidin Tanderil  Ascriptin Extra Strength Dolobid Moblgesic Ticlid  Ascriptin with Codeine Doloprin or Doloprin with Codeine Momentum Tolectin  Asperbuf Duoprin Mono-gesic Trendar  Aspergum Duradyne Motrin or Motrin IB Triminicin  Aspirin plain, buffered or enteric  coated Durasal Myochrisine Trigesic  Aspirin Suppositories Easprin Nalfon Trillsate  Aspirin with Codeine Ecotrin Regular or Extra Strength Naprosyn Uracel  Atromid-S Efficin Naproxen Ursinus  Auranofin Capsules Elmiron Neocylate Vanquish  Axotal Emagrin Norgesic Verin  Azathioprine Empirin or Empirin with Codeine Normiflo Vitamin E  Azolid Emprazil Nuprin Voltaren  Bayer Aspirin plain, buffered or children's or timed BC Tablets or powders Encaprin Orgaran Warfarin Sodium  Buff-a-Comp Enoxaparin Orudis Zorpin  Buff-a-Comp with Codeine Equegesic Os-Cal-Gesic   Buffaprin Excedrin plain, buffered or Extra Strength Oxalid   Bufferin Arthritis Strength Feldene Oxphenbutazone   Bufferin plain or Extra Strength Feldene Capsules Oxycodone with Aspirin   Bufferin with Codeine Fenoprofen Fenoprofen Pabalate or Pabalate-SF   Buffets II Flogesic Panagesic   Buffinol plain or Extra Strength Florinal or Florinal with Codeine Panwarfarin   Buf-Tabs Flurbiprofen Penicillamine   Butalbital Compound Four-way cold tablets Penicillin   Butazolidin Fragmin Pepto-Bismol   Carbenicillin Geminisyn Percodan   Carna Arthritis Reliever Geopen Persantine   Carprofen Gold's salt Persistin   Chloramphenicol Goody's Phenylbutazone   Chloromycetin Haltrain Piroxlcam   Clmetidine heparin Plaquenil   Cllnoril Hyco-pap Ponstel   Clofibrate Hydroxy chloroquine Propoxyphen         Before stopping any of these medications, be sure to consult the physician who ordered them.  Some, such as Coumadin (Warfarin) are ordered to prevent or treat serious conditions such as "deep thrombosis", "pumonary embolisms", and other heart problems.  The amount of time that you may need off of the medication may also vary with the medication and the reason for which you were taking it.  If you are taking any of these medications, please make sure you notify your pain physician before you undergo any procedures.          

## 2015-09-02 ENCOUNTER — Other Ambulatory Visit: Payer: Self-pay

## 2015-09-08 ENCOUNTER — Telehealth: Payer: Self-pay | Admitting: Pain Medicine

## 2015-09-08 ENCOUNTER — Other Ambulatory Visit: Payer: Self-pay

## 2015-09-08 DIAGNOSIS — M62838 Other muscle spasm: Secondary | ICD-10-CM

## 2015-09-08 DIAGNOSIS — M792 Neuralgia and neuritis, unspecified: Secondary | ICD-10-CM

## 2015-09-08 DIAGNOSIS — G8929 Other chronic pain: Secondary | ICD-10-CM

## 2015-09-08 MED ORDER — GABAPENTIN 800 MG PO TABS: 800.0000 mg | ORAL_TABLET | Freq: Four times a day (QID) | ORAL | Status: DC

## 2015-09-08 MED ORDER — TIZANIDINE HCL 2 MG PO CAPS: 2.0000 mg | ORAL_CAPSULE | Freq: Three times a day (TID) | ORAL | Status: DC | PRN

## 2015-09-08 NOTE — Telephone Encounter (Signed)
Needs gabapentin sent to pharmacy please, did not get at last appt, please call patient [phone]

## 2015-09-08 NOTE — Telephone Encounter (Signed)
Medications sent to pharmacy

## 2015-09-08 NOTE — Telephone Encounter (Signed)
Was last prescribed on 12/16. Next appt 5/17.

## 2015-09-09 LAB — TOXASSURE SELECT 13 (MW), URINE: PDF: 0

## 2015-11-30 ENCOUNTER — Encounter: Payer: Self-pay | Admitting: Pain Medicine

## 2015-11-30 ENCOUNTER — Other Ambulatory Visit
Admission: RE | Admit: 2015-11-30 | Discharge: 2015-11-30 | Disposition: A | Discharge status: Home / Self Care | Payer: Medicare Other | Source: Ambulatory Visit | Attending: Pain Medicine | Admitting: Pain Medicine

## 2015-11-30 ENCOUNTER — Ambulatory Visit: Payer: Medicare Other | Attending: Pain Medicine | Admitting: Pain Medicine

## 2015-11-30 VITALS — BP 152/93 | HR 88 | Temp 98.5°F | Resp 16 | Ht 68.0 in | Wt 218.0 lb

## 2015-11-30 DIAGNOSIS — M79602 Pain in left arm: Secondary | ICD-10-CM | POA: Insufficient documentation

## 2015-11-30 DIAGNOSIS — Z981 Arthrodesis status: Secondary | ICD-10-CM | POA: Diagnosis not present

## 2015-11-30 DIAGNOSIS — M47892 Other spondylosis, cervical region: Secondary | ICD-10-CM | POA: Insufficient documentation

## 2015-11-30 DIAGNOSIS — M2578 Osteophyte, vertebrae: Secondary | ICD-10-CM | POA: Diagnosis not present

## 2015-11-30 DIAGNOSIS — M792 Neuralgia and neuritis, unspecified: Secondary | ICD-10-CM

## 2015-11-30 DIAGNOSIS — M79601 Pain in right arm: Secondary | ICD-10-CM | POA: Diagnosis not present

## 2015-11-30 DIAGNOSIS — M791 Myalgia: Secondary | ICD-10-CM | POA: Diagnosis not present

## 2015-11-30 DIAGNOSIS — E559 Vitamin D deficiency, unspecified: Secondary | ICD-10-CM | POA: Insufficient documentation

## 2015-11-30 DIAGNOSIS — F1190 Opioid use, unspecified, uncomplicated: Secondary | ICD-10-CM

## 2015-11-30 DIAGNOSIS — M5021 Other cervical disc displacement,  high cervical region: Secondary | ICD-10-CM | POA: Insufficient documentation

## 2015-11-30 DIAGNOSIS — M542 Cervicalgia: Secondary | ICD-10-CM | POA: Insufficient documentation

## 2015-11-30 DIAGNOSIS — H3530 Unspecified macular degeneration: Secondary | ICD-10-CM | POA: Insufficient documentation

## 2015-11-30 DIAGNOSIS — M5412 Radiculopathy, cervical region: Secondary | ICD-10-CM | POA: Diagnosis not present

## 2015-11-30 DIAGNOSIS — K5903 Drug induced constipation: Secondary | ICD-10-CM | POA: Diagnosis not present

## 2015-11-30 DIAGNOSIS — I10 Essential (primary) hypertension: Secondary | ICD-10-CM | POA: Diagnosis not present

## 2015-11-30 DIAGNOSIS — E785 Hyperlipidemia, unspecified: Secondary | ICD-10-CM | POA: Diagnosis not present

## 2015-11-30 DIAGNOSIS — M4802 Spinal stenosis, cervical region: Secondary | ICD-10-CM | POA: Insufficient documentation

## 2015-11-30 DIAGNOSIS — M47812 Spondylosis without myelopathy or radiculopathy, cervical region: Secondary | ICD-10-CM

## 2015-11-30 DIAGNOSIS — F411 Generalized anxiety disorder: Secondary | ICD-10-CM | POA: Diagnosis not present

## 2015-11-30 DIAGNOSIS — M25511 Pain in right shoulder: Secondary | ICD-10-CM | POA: Insufficient documentation

## 2015-11-30 DIAGNOSIS — M6248 Contracture of muscle, other site: Secondary | ICD-10-CM

## 2015-11-30 DIAGNOSIS — Z5181 Encounter for therapeutic drug level monitoring: Secondary | ICD-10-CM

## 2015-11-30 DIAGNOSIS — M62838 Other muscle spasm: Secondary | ICD-10-CM

## 2015-11-30 DIAGNOSIS — M7918 Myalgia, other site: Secondary | ICD-10-CM

## 2015-11-30 DIAGNOSIS — G8929 Other chronic pain: Secondary | ICD-10-CM | POA: Diagnosis not present

## 2015-11-30 DIAGNOSIS — Z79891 Long term (current) use of opiate analgesic: Secondary | ICD-10-CM

## 2015-11-30 DIAGNOSIS — T402X5A Adverse effect of other opioids, initial encounter: Secondary | ICD-10-CM

## 2015-11-30 LAB — COMPREHENSIVE METABOLIC PANEL
ALT: 32 U/L (ref 17–63)
ANION GAP: 6 (ref 5–15)
AST: 28 U/L (ref 15–41)
Albumin: 4.1 g/dL (ref 3.5–5.0)
Alkaline Phosphatase: 74 U/L (ref 38–126)
BUN: 11 mg/dL (ref 6–20)
CHLORIDE: 103 mmol/L (ref 101–111)
CO2: 29 mmol/L (ref 22–32)
Calcium: 9.1 mg/dL (ref 8.9–10.3)
Creatinine, Ser: 0.95 mg/dL (ref 0.61–1.24)
Glucose, Bld: 97 mg/dL (ref 65–99)
POTASSIUM: 4 mmol/L (ref 3.5–5.1)
Sodium: 138 mmol/L (ref 135–145)
Total Bilirubin: 0.5 mg/dL (ref 0.3–1.2)
Total Protein: 7.4 g/dL (ref 6.5–8.1)

## 2015-11-30 LAB — SEDIMENTATION RATE: SED RATE: 4 mm/h (ref 0–15)

## 2015-11-30 LAB — MAGNESIUM: MAGNESIUM: 2.1 mg/dL (ref 1.7–2.4)

## 2015-11-30 LAB — C-REACTIVE PROTEIN: CRP: 0.6 mg/dL (ref <1.0)

## 2015-11-30 MED ORDER — BENEFIBER PO POWD: ORAL | Status: DC

## 2015-11-30 MED ORDER — GABAPENTIN 800 MG PO TABS: 800.0000 mg | ORAL_TABLET | Freq: Four times a day (QID) | ORAL | Status: DC

## 2015-11-30 MED ORDER — OXYCODONE HCL 5 MG PO TABS: 5.0000 mg | ORAL_TABLET | Freq: Every day | ORAL | Status: DC | PRN

## 2015-11-30 MED ORDER — TIZANIDINE HCL 2 MG PO CAPS: 2.0000 mg | ORAL_CAPSULE | Freq: Three times a day (TID) | ORAL | Status: DC | PRN

## 2015-11-30 MED ORDER — MAGNESIUM OXIDE -MG SUPPLEMENT 500 MG PO CAPS: 1.0000 | ORAL_CAPSULE | Freq: Two times a day (BID) | ORAL | Status: DC

## 2015-11-30 NOTE — Assessment & Plan Note (Signed)
Currently under control

## 2015-11-30 NOTE — Progress Notes (Signed)
Safety precautions to be maintained throughout the outpatient stay will include: orient to surroundings, keep bed in low position, maintain call bell within reach at all times, provide assistance with transfer out of bed and ambulation.  #0 out of 150  Oxycodone 5 mg remaining. Filled 10-31-15

## 2015-11-30 NOTE — Progress Notes (Signed)
Patient's Name: Jared Bass  Patient type: Established  MRN: [id number]  Service setting: Ambulatory outpatient  DOB: [birth year]  Location: [facility]  DOS: 11/30/2015  Primary Care Physician: Sherrie Mustache, MD  Note by: Sydnee Levans. Laban Emperor, M.D, DABA, DABAPM, DABPM, DABIPP, FIPP  Referring Physician: Sherrie Mustache, MD  Specialty: Board-Certified Interventional Pain Management  Last Visit to Pain Management: 09/08/2015   Primary Reason(s) for Visit: Encounter for prescription drug management (Level of risk: moderate) CC: Neck Pain   HPI  [name] is a 45 y.o. year old, male patient, who returns today as an established patient. 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); Long term current use of opiate analgesic; Long term prescription opiate use; Opiate use (37.5 MME/Day); Opiate dependence (HCC); Encounter for therapeutic drug level monitoring; Neurogenic pain; Chronic cervical radicular pain (Location of Primary Source of Pain) (Bilateral) (R>L) (C6 and C7 Dermatome); Chronic shoulder pain (Right); Chronic elbow pain (Right); Cervical spinal stenosis; Cervical foraminal stenosis (right side); Myofascial pain; Muscle spasms of neck; Vitamin D deficiency; History of tachycardia; Essential hypertension; History of head injury; Generalized anxiety disorder; Hyperlipidemia; Chronic upper extremity pain (Location of Primary Source of Pain) (Bilateral) (R>L); Radicular pain of shoulder (Right); and Opioid-induced constipation on his problem list.. His primarily concern today is the Neck Pain   Pain Assessment: Self-Reported Pain Score: 1  Reported level is compatible with observation Pain Type: Chronic pain Pain Location: Neck Pain Descriptors / Indicators: Burning Pain Frequency: Intermittent  The  patient comes into the clinics today for pharmacological management of his chronic pain. I last saw this patient on 09/08/2015. The patient  reports that he does not use illicit drugs. His body mass index is 33.15 kg/(m^2).  Date of Last Visit: 08/31/15 Service Provided on Last Visit: Med Refill  Controlled Substance Pharmacotherapy Assessment & REMS (Risk Evaluation and Mitigation Strategy)  Analgesic: Oxycodone IR 5 mg 5 times a day (25 mg/day of oxycodone) Pill Count: #0 out of 150 Oxycodone 5 mg remaining. Filled 10-31-15. MME/day: 37.5 mg/day Pharmacokinetics: Onset of action (Liberation/Absorption): Within expected pharmacological parameters Time to Peak effect (Distribution): Timing and results are as within normal expected parameters Duration of action (Metabolism/Excretion): Within normal limits for medication Pharmacodynamics: Analgesic Effect: More than 50% Activity Facilitation: Medication(s) allow patient to sit, stand, walk, and do the basic ADLs Perceived Effectiveness: Described as relatively effective, allowing for increase in activities of daily living (ADL) Side-effects or Adverse reactions: None reported Monitoring: [place] PMP: Online review of the past 14-month period conducted. Compliant with practice rules and regulations Last UDS on record: TOXASSURE SELECT 13  Date Value Ref Range Status  08/31/2015 FINAL  Final    Comment:    ==================================================================== TOXASSURE SELECT 13 (MW) ==================================================================== Test                             Result       Flag       Units Drug Present and Declared for Prescription Verification   Oxycodone                      286          EXPECTED   ng/mg creat   Noroxycodone  692          EXPECTED   ng/mg creat    Sources of oxycodone include scheduled prescription medications.    Noroxycodone is an expected metabolite of  oxycodone. ==================================================================== Test                      Result    Flag   Units      Ref Range   Creatinine              87               mg/dL      >=16 ==================================================================== Declared Medications:  The flagging and interpretation on this report are based on the  following declared medications.  Unexpected results may arise from  inaccuracies in the declared medications.  **Note: The testing scope of this panel includes these medications:  Oxycodone (Oxy-IR)  Oxycodone (Roxicodone)  **Note: The testing scope of this panel does not include following  reported medications:  Gabapentin (Neurontin)  Propranolol (Inderal)  Tizanidine (Zanaflex) ==================================================================== For clinical consultation, please call [phone]. ====================================================================    UDS interpretation: Compliant Medication Assessment Form: Reviewed. Patient indicates being compliant with therapy Treatment compliance: Compliant Risk Assessment: Aberrant Behavior: None observed today Substance Use Disorder (SUD) Risk Level: No change since last visit Risk of opioid abuse or dependence: 0.7-3.0% with doses ? 36 MME/day and 6.1-26% with doses ? 120 MME/day. Opioid Risk Tool (ORT) Score: Total Score: 3 Low Risk for SUD (Score <3) Depression Scale Score: PHQ-2: PHQ-2 Total Score: 0 No depression (0) PHQ-9: PHQ-9 Total Score: 0 No depression (0-4)  Pharmacologic Plan: No change in therapy, at this time  Previous Illicit Drug Screen Labs(s): Lab Results  Component Value Date   COCAINSCRNUR NONE DETECTED 08/04/2008   THCU NONE DETECTED 08/04/2008    Laboratory Chemistry  Inflammation Markers Lab Results  Component Value Date   ESRSEDRATE 4 11/30/2015    Renal Function Lab Results  Component Value Date   BUN 11 11/30/2015    CREATININE 0.95 11/30/2015   GFRAA >60 11/30/2015   GFRNONAA >60 11/30/2015    Hepatic Function Lab Results  Component Value Date   AST 28 11/30/2015   ALT 32 11/30/2015   ALBUMIN 4.1 11/30/2015    Electrolytes Lab Results  Component Value Date   NA 138 11/30/2015   K 4.0 11/30/2015   CL 103 11/30/2015   CALCIUM 9.1 11/30/2015   MG 2.1 11/30/2015    Pain Modulating Vitamins No results found for: VD25OH, VD125OH2TOT, WJ1914NW2, NF6213YQ6, VITAMINB12  Coagulation Parameters Lab Results  Component Value Date   PLT 230 01/21/2008    Note: Labs reviewed. and Results made available to patient  Recent Diagnostic Imaging  Cervical Imaging: Results for orders placed during the hospital encounter of 10/09/14  MR Cervical Spine Wo Contrast   Narrative CLINICAL DATA:  Chronic cervicalgia/neck pain. C5 through C7 fusion 2008/2009. Paresthesias in bilateral arms enhance. Sudden onset of symptoms yesterday morning. No neck trauma.  EXAM: MRI CERVICAL SPINE WITHOUT CONTRAST  TECHNIQUE: Multiplanar, multisequence MR imaging of the cervical spine was performed. No intravenous contrast was administered.  COMPARISON:  CT 08/27/2014.  MRI 10/14/2013.  FINDINGS: Alignment: Straightening of the normal cervical lordosis with slight reversal around C3-C4. This is a chronic finding, also present on previous MRIs. No spondylolisthesis.  Vertebrae: C5 through C7 ACDF. No destructive osseous lesions. Negative for fracture.  Cord: No cord edema or intramedullary lesion.  Posterior Fossa: Within normal limits and unchanged from prior. Small Tornwaldt cyst incidentally noted.  Vertebral Arteries: Flow voids present bilaterally.  Paraspinal tissues: Normal.  Disc Signal: Within normal limits for age.  The spinal canal is congenitally narrow with short pedicles.  Disc levels:  C2-C3:  Mild facet arthrosis.  No stenosis.  No change.  C3-C4: Mild central stenosis associated  with LEFT paracentral disc osteophyte complex. Indentation of the ventral cord. Bilateral foraminal stenosis associated with uncovertebral spurring. Short pedicles are also incidentally noted contributing to the stenosis. Foraminal stenosis potentially affects both C4 nerves.  C4-C5: Central canal patent. Bilateral uncovertebral spurring with bilateral foraminal stenosis potentially affecting both C5 nerves. No interval change compared to prior.  C5-C6: ACDF. Solid fusion. RIGHT eccentric residual/ recurrent osseous ridging, better demonstrated on prior CT. This is a chronic finding and is unchanged. Flattening of the RIGHT ventral cord. RIGHT foraminal stenosis due to RIGHT uncovertebral spurring. This is chronic and unchanged.  C6-C7: ACDF. Solid fusion. Central canal and LEFT foramen patent. Chronic RIGHT foraminal stenosis associated with uncovertebral spurring.  C7-T1: Shallow central disc osteophyte complex without central stenosis. Stable minimal bilateral foraminal narrowing associated with short pedicles.  IMPRESSION: No interval change or acute abnormality. Chronic central and foraminal stenosis detailed above. Solid C5 through C7 fusion. Central stenosis is due to a combination of ossification of the posterior longitudinal ligament and disc osteophyte complexes.   Electronically Signed   By: Andreas Newport M.D.   On: 10/09/2014 14:09    Cervical CT wo contrast:  Results for orders placed during the hospital encounter of 08/27/14  CT Cervical Spine Wo Contrast   Narrative CLINICAL DATA:  Slip and fall this evening, now with right hand tingling. History of cervical spine surgery.  EXAM: CT CERVICAL SPINE WITHOUT CONTRAST  TECHNIQUE: Multidetector CT imaging of the cervical spine was performed without intravenous contrast. Multiplanar CT image reconstructions were also generated.  COMPARISON:  CT cervical spine 01/25/2014  FINDINGS: Anterior C5 through C7  fusion with interbody spacers. The hardware is intact. Stable endplate spurring/calcified disc osteophyte complex on the right causing foraminal stenosis at C5-C6 and C6-C7. Unchanged straightening of normal cervical lordosis. Vertebral body heights are maintained. Disc space narrowing and endplate spurring at C4-C5, unchanged.  No acute fracture. The dens is intact. There are no jumped or perched facets. No prevertebral soft tissue edema.  IMPRESSION: Postsurgical and degenerative change in the cervical spine, stable in appearance from prior. No acute fracture or subluxation.   Electronically Signed   By: Rubye Oaks M.D.   On: 08/27/2014 22:51    Cervical CT w contrast:  Results for orders placed during the hospital encounter of 06/01/08  CT Cervical Spine W Contrast   Narrative Clinical Data:  Neck pain.  Right arm pain.       MYELOGRAM CERVICAL   Technique:Lumbar puncture was performed by myself from a left-sided approach at the L3-4 interlaminar space using a 22 gauge spinal needle.  9 ml Omnipaque-300 were instilled. Following injection of intrathecal Omnipaque contrast, spine imaging in multiple projections was performed using fluoroscopy.   Fluoroscopy Time: 1 minute 17 seconds .   Comparison: Outside MRI   Findings: There has been previous anterior cervical discectomy and fusion at C6-7.  Root sleeve filling on the left is normal.  On the right, there is diminished filling of the C6 and C7 root sleeves. No significant anterior extradural defect.   IMPRESSION: Diminished filling of the right C6 and  C7 root sleeves   CT MYELOGRAPHY CERVICAL SPINE   Technique:  CT imaging of the cervical spine was performed after intrathecal contrast administration. Multiplanar CT image reconstructions were also generated.   Findings:  The foramen magnum is widely patent.  There is ordinary degenerative change between the anterior arch of C1 and the dens. No  encroachment upon the neural spaces.   C2-3:  Normal   C3-4:  Small central disc protrusion indents the ventral subarachnoid space.  Mild uncovertebral degeneration on the left encroaches minimally upon the foramen.  No neural compressive pathology suspected at this level.   C4-5:  Minor disc bulge.  No significant narrowing of the canal or foramina.   C5-6:  Broad-based disc herniation more prominent in the right posterolateral direction.  The ventral subarachnoid space is effaced in the right side of the cord is indented slightly.  There is foraminal stenosis on the right because of osteophyte and disc material.  C6 nerve root compression on the right would be quite likely.   C6-7:  Previous anterior cervical discectomy and fusion.  No lucency around the screws to suggest nonunion.  Central canal is widely patent.  There is foraminal stenosis on the right because of osteophytic encroachment.  Right C7 nerve root compression could be occurring.   C7-T1:  Minimal disc bulge but no herniation or stenosis.   IMPRESSION: Previous anterior cervical discectomy and fusion at C6-7.  Fusion is presumably solid as there is no peri screw lucency.  Foraminal stenosis on the right because of osteophytic encroachment could effect the right C7 nerve root.   Right posterolateral disc herniation at C5-6.  Foraminal stenosis on the right because of osteophyte and disc material.  Right C6 nerve root compression seems likely.  Provider: Arvella Nigh   Cervical DG 2-3 views:  Results for orders placed in visit on 04/27/13  DG Cervical Spine 2 or 3 views   Narrative * PRIOR REPORT IMPORTED FROM AN EXTERNAL SYSTEM *   PRIOR REPORT IMPORTED FROM THE [facility] WORKFLOW SYSTEM   REASON FOR EXAM:    cervical spine tenderness  COMMENTS:   LMP: (Male)   PROCEDURE:     DXR - DXR C- SPINE AP AND LATERAL  - Apr 27 2013  5:12PM   RESULT:     Technique: Cervical spine series was obtained.   Findings:  There is no evidence of fracture, dislocation nor malalignment.  There is no evidence of prevertebral soft tissue swelling. Patient status  post anterior fusion of the C5-C7 vertebra. The hardware appears intact  without evidence of loosening or failure. Prosthetic intravertebral disc  is  appreciated at the C5-6 level.   IMPRESSION:      No evidence of acute osseous abnormalities.  2. Comparison 05/02/2011       Cervical DG Bending/F/E views:  Results for orders placed during the hospital encounter of 11/23/10  DG Cervical Spine With Flex & Extend   Narrative *RADIOLOGY REPORT*  Clinical Data: Trauma with neck pain, arm pain and numbness of fingers.  Status post prior cervical fusion.  CERVICAL SPINE COMPLETE WITH FLEXION AND EXTENSION VIEWS  Comparison: CT cervical spine dated 07/24/2009 as well as other prior imaging of the cervical spine.  Findings: Fusion hardware spanning the C5 to C7 levels is stable with solid bony fusion present.  No evidence of instability with flexion or extension.  No acute cervical fracture or subluxation identified.  Mild spondylosis at C4-5 and C7-T1.  IMPRESSION: No acute  findings.  Solid cervical fusion from C5-C7 without evidence of instability with flexion or extension.  Original Report Authenticated By: Reola Calkins, M.D.   Cervical DG complete:  Results for orders placed during the hospital encounter of 12/15/13  DG Cervical Spine Complete   Narrative CLINICAL DATA:  Neck pain after being thrown from horse yesterday.  EXAM: CERVICAL SPINE  4+ VIEWS  COMPARISON:  CT and MRI 10/14/2013  FINDINGS: The prevertebral soft tissues are normal. The alignment is anatomic through T1. There is no evidence of acute fracture or traumatic subluxation. The C1-2 articulation appears normal in the AP projection. Patient is status post C5-7 ACDF. The hardware is intact. The fusion appears solid. Oblique views demonstrate mild chronic right  foraminal stenosis at C5-6.  IMPRESSION: Negative for acute cervical spine fracture, traumatic subluxation or static signs of instability. Chronic right foraminal stenosis at C5-6.   Electronically Signed   By: [name] M.D.   On: 12/15/2013 12:38    Cervical DG Myelogram views:  Results for orders placed during the hospital encounter of 06/01/08  DG Myelogram Cervical   Narrative Clinical Data:  Neck pain.  Right arm pain.       MYELOGRAM CERVICAL   Technique:Lumbar puncture was performed by myself from a left-sided approach at the L3-4 interlaminar space using a 22 gauge spinal needle.  9 ml Omnipaque-300 were instilled. Following injection of intrathecal Omnipaque contrast, spine imaging in multiple projections was performed using fluoroscopy.   Fluoroscopy Time: 1 minute 17 seconds .   Comparison: Outside MRI   Findings: There has been previous anterior cervical discectomy and fusion at C6-7.  Root sleeve filling on the left is normal.  On the right, there is diminished filling of the C6 and C7 root sleeves. No significant anterior extradural defect.   IMPRESSION: Diminished filling of the right C6 and C7 root sleeves   CT MYELOGRAPHY CERVICAL SPINE   Technique:  CT imaging of the cervical spine was performed after intrathecal contrast administration. Multiplanar CT image reconstructions were also generated.   Findings:  The foramen magnum is widely patent.  There is ordinary degenerative change between the anterior arch of C1 and the dens. No encroachment upon the neural spaces.   C2-3:  Normal   C3-4:  Small central disc protrusion indents the ventral subarachnoid space.  Mild uncovertebral degeneration on the left encroaches minimally upon the foramen.  No neural compressive pathology suspected at this level.   C4-5:  Minor disc bulge.  No significant narrowing of the canal or foramina.   C5-6:  Broad-based disc herniation more prominent in the  right posterolateral direction.  The ventral subarachnoid space is effaced in the right side of the cord is indented slightly.  There is foraminal stenosis on the right because of osteophyte and disc material.  C6 nerve root compression on the right would be quite likely.   C6-7:  Previous anterior cervical discectomy and fusion.  No lucency around the screws to suggest nonunion.  Central canal is widely patent.  There is foraminal stenosis on the right because of osteophytic encroachment.  Right C7 nerve root compression could be occurring.   C7-T1:  Minimal disc bulge but no herniation or stenosis.   IMPRESSION: Previous anterior cervical discectomy and fusion at C6-7.  Fusion is presumably solid as there is no peri screw lucency.  Foraminal stenosis on the right because of osteophytic encroachment could effect the right C7 nerve root.   Right posterolateral disc  herniation at C5-6.  Foraminal stenosis on the right because of osteophyte and disc material.  Right C6 nerve root compression seems likely.  Provider: Coral Else    Meds  The patient has a current medication list which includes the following prescription(s): gabapentin, oxycodone, oxycodone, oxycodone, propranolol, tizanidine, magnesium oxide, and benefiber.  Current Outpatient Prescriptions on File Prior to Visit  Medication Sig  . propranolol (INDERAL) 20 MG tablet Take 20 mg by mouth 3 (three) times daily.   No current facility-administered medications on file prior to visit.    ROS  Constitutional: Denies any fever or chills Gastrointestinal: No reported hemesis, hematochezia, vomiting, or acute GI distress Musculoskeletal: Denies any acute onset joint swelling, redness, loss of ROM, or weakness Neurological: No reported episodes of acute onset apraxia, aphasia, dysarthria, agnosia, amnesia, paralysis, loss of coordination, or loss of consciousness  Allergies  Mr. Yom is allergic to tramadol and  other.  PFSH  Medical:  Mr. Colston  has a past medical history of Chronic neck pain; Right eye injury; Hypertension; History of tachycardia (05/04/2015); History of head injury (05/04/2015); and DDD (degenerative disc disease), cervical (05/04/2015). Family: family history includes COPD in his father and mother; Cancer in his father; Hypertension in his mother. Surgical:  has past surgical history that includes neck fusion. Tobacco:  reports that he has never smoked. He does not have any smokeless tobacco history on file. Alcohol:  reports that he does not drink alcohol. Drug:  reports that he does not use illicit drugs.  Constitutional Exam  Vitals: Blood pressure 152/93, pulse 88, temperature 98.5 F (36.9 C), temperature source Oral, resp. rate 16, height 5\' 8"  (1.727 m), weight 218 lb (98.884 kg), SpO2 97 %. General appearance: Well nourished, well developed, and well hydrated. In no acute distress Calculated BMI/Body habitus: Body mass index is 33.15 kg/(m^2). (30-34.9 kg/m2) Obese (Class I) - 68% higher incidence of chronic pain Psych/Mental status: Alert and oriented x 3 (person, place, & time) Eyes: PERLA Respiratory: No evidence of acute respiratory distress  Cervical Spine Exam  Inspection: No masses, redness, or swelling Alignment: Symmetrical ROM: Functional: Decreased ROM Stability: No instability detected Muscle strength & Tone: Functionally intact Sensory: Unimpaired Palpation: No complaints of tenderness  Upper Extremity (UE) Exam    Side: Right upper extremity  Side: Left upper extremity  Inspection: No masses, redness, swelling, or asymmetry  Inspection: No masses, redness, swelling, or asymmetry  ROM:  ROM:  Functional: ROM is within functional limits [facility])  Functional: ROM is within functional limits [facility])  Muscle strength & Tone: Functionally intact  Muscle strength & Tone: Functionally intact  Sensory: Unimpaired  Sensory: Unimpaired  Palpation: Non-contributory   Palpation: Non-contributory   Thoracic Spine Exam  Inspection: No masses, redness, or swelling Alignment: Symmetrical ROM: Functional: ROM is within functional limits [facility]) Stability: No instability detected Sensory: Unimpaired Muscle strength & Tone: Functionally intact Palpation: No complaints of tenderness  Lumbar Spine Exam  Inspection: No masses, redness, or swelling Alignment: Symmetrical ROM: Functional: ROM is within functional limits [facility]) Stability: No instability detected Muscle strength & Tone: Functionally intact Sensory: Unimpaired Palpation: No complaints of tenderness Provocative Tests: Lumbar Hyperextension and rotation test: deferred Patrick's Maneuver: deferred  Gait & Posture Assessment  Ambulation: Unassisted Gait: Unaffected Posture: WNL  Lower Extremity Exam    Side: Right lower extremity  Side: Left lower extremity  Inspection: No masses, redness, swelling, or asymmetry ROM:  Inspection: No masses, redness, swelling, or asymmetry ROM:  Functional: ROM  is within functional limits [facility])  Functional: ROM is within functional limits [facility])  Muscle strength & Tone: Functionally intact  Muscle strength & Tone: Functionally intact  Sensory: Unimpaired  Sensory: Unimpaired  Palpation: Non-contributory  Palpation: Non-contributory   Assessment & Plan  Primary Diagnosis & Pertinent Problem List: The primary encounter diagnosis was Chronic pain. Diagnoses of Encounter for therapeutic drug level monitoring, Long term current use of opiate analgesic, Chronic cervical radicular pain (Location of Primary Source of Pain) (Bilateral) (R>L) (C6 and C7 Dermatome), Opiate use (37.5 MME/Day), Muscle spasms of neck, Neuropathic pain, Neurogenic pain, Myofascial pain, Chronic neck pain (Location of Secondary source of pain) (Bilateral) (R>L), Cervical spondylosis, Chronic shoulder pain (Right), Chronic pain of right upper extremity, Radicular pain of shoulder (Right), and  Opioid-induced constipation were also pertinent to this visit.  Visit Diagnosis: 1. Chronic pain   2. Encounter for therapeutic drug level monitoring   3. Long term current use of opiate analgesic   4. Chronic cervical radicular pain (Location of Primary Source of Pain) (Bilateral) (R>L) (C6 and C7 Dermatome)   5. Opiate use (37.5 MME/Day)   6. Muscle spasms of neck   7. Neuropathic pain   8. Neurogenic pain   9. Myofascial pain   10. Chronic neck pain (Location of Secondary source of pain) (Bilateral) (R>L)   11. Cervical spondylosis   12. Chronic shoulder pain (Right)   13. Chronic pain of right upper extremity   14. Radicular pain of shoulder (Right)   15. Opioid-induced constipation     Problems updated and reviewed during this visit: Problem  Opioid-induced constipation    Problem-specific Plan(s): Opioid-induced constipation Currently under control   No new assessment & plan notes have been filed under this hospital service since the last note was generated. Service: Pain Management   Plan of Care   Problem List Items Addressed This Visit      High   Cervical spondylosis (Chronic)   Relevant Medications   tizanidine (ZANAFLEX) 2 MG capsule   oxyCODONE (OXY IR/ROXICODONE) 5 MG immediate release tablet   oxyCODONE (OXY IR/ROXICODONE) 5 MG immediate release tablet   oxyCODONE (OXY IR/ROXICODONE) 5 MG immediate release tablet   Other Relevant Orders   CERVICAL FACET (MEDIAL BRANCH NERVE BLOCK)    Chronic cervical radicular pain (Location of Primary Source of Pain) (Bilateral) (R>L) (C6 and C7 Dermatome) (Chronic)   Relevant Medications   tizanidine (ZANAFLEX) 2 MG capsule   gabapentin (NEURONTIN) 800 MG tablet   Other Relevant Orders   CERVICAL EPIDURAL STEROID INJECTION   CERVICAL EPIDURAL STEROID INJECTION   Chronic neck pain (Location of Secondary source of pain) (Bilateral) (R>L) (Chronic)   Relevant Medications   tizanidine (ZANAFLEX) 2 MG capsule    gabapentin (NEURONTIN) 800 MG tablet   oxyCODONE (OXY IR/ROXICODONE) 5 MG immediate release tablet   oxyCODONE (OXY IR/ROXICODONE) 5 MG immediate release tablet   oxyCODONE (OXY IR/ROXICODONE) 5 MG immediate release tablet   Other Relevant Orders   CERVICAL FACET (MEDIAL BRANCH NERVE BLOCK)    Chronic pain - Primary (Chronic)   Relevant Medications   tizanidine (ZANAFLEX) 2 MG capsule   gabapentin (NEURONTIN) 800 MG tablet   oxyCODONE (OXY IR/ROXICODONE) 5 MG immediate release tablet   oxyCODONE (OXY IR/ROXICODONE) 5 MG immediate release tablet   oxyCODONE (OXY IR/ROXICODONE) 5 MG immediate release tablet   Chronic shoulder pain (Right) (Chronic)   Relevant Orders   SHOULDER INJECTION   SUPRASCAPULAR NERVE BLOCK   Chronic upper  extremity pain (Location of Primary Source of Pain) (Bilateral) (R>L) (Chronic)   Relevant Medications   tizanidine (ZANAFLEX) 2 MG capsule   gabapentin (NEURONTIN) 800 MG tablet   oxyCODONE (OXY IR/ROXICODONE) 5 MG immediate release tablet   oxyCODONE (OXY IR/ROXICODONE) 5 MG immediate release tablet   oxyCODONE (OXY IR/ROXICODONE) 5 MG immediate release tablet   Other Relevant Orders   CERVICAL EPIDURAL STEROID INJECTION   Muscle spasms of neck (Chronic)   Relevant Medications   tizanidine (ZANAFLEX) 2 MG capsule   Magnesium Oxide 500 MG CAPS   Myofascial pain (Chronic)   Relevant Medications   Magnesium Oxide 500 MG CAPS   Neurogenic pain (Chronic)   Relevant Medications   gabapentin (NEURONTIN) 800 MG tablet   Radicular pain of shoulder (Right) (Chronic)   Relevant Medications   tizanidine (ZANAFLEX) 2 MG capsule   gabapentin (NEURONTIN) 800 MG tablet   Other Relevant Orders   CERVICAL EPIDURAL STEROID INJECTION     Medium   Encounter for therapeutic drug level monitoring   Long term current use of opiate analgesic (Chronic)   Relevant Orders   ToxASSURE Select 13 (MW), Urine   Opiate use (37.5 MME/Day) (Chronic)   Opioid-induced  constipation (Chronic)    Currently under control      Relevant Medications   Wheat Dextrin (BENEFIBER) POWD    Other Visit Diagnoses    Neuropathic pain  (Chronic)       Relevant Medications    gabapentin (NEURONTIN) 800 MG tablet        Pharmacotherapy (Medications Ordered): Meds ordered this encounter  Medications  . tizanidine (ZANAFLEX) 2 MG capsule    Sig: Take 1 capsule (2 mg total) by mouth 3 (three) times daily as needed for muscle spasms.    Dispense:  90 capsule    Refill:  2    Do not place this medication, or any other prescription from our practice, on "Automatic Refill". Patient may have prescription filled one day early if pharmacy is closed on scheduled refill date.  . gabapentin (NEURONTIN) 800 MG tablet    Sig: Take 1 tablet (800 mg total) by mouth every 6 (six) hours.    Dispense:  120 tablet    Refill:  2    Do not place this medication, or any other prescription from our practice, on "Automatic Refill". Patient may have prescription filled one day early if pharmacy is closed on scheduled refill date.  Marland Kitchen oxyCODONE (OXY IR/ROXICODONE) 5 MG immediate release tablet    Sig: Take 1 tablet (5 mg total) by mouth 5 (five) times daily as needed for severe pain.    Dispense:  150 tablet    Refill:  0    Do not place this medication, or any other prescription from our practice, on "Automatic Refill". Patient may have prescription filled one day early if pharmacy is closed on scheduled refill date. Do not fill until: 11/30/15 To last until: 12/30/15  . oxyCODONE (OXY IR/ROXICODONE) 5 MG immediate release tablet    Sig: Take 1 tablet (5 mg total) by mouth 5 (five) times daily as needed for severe pain.    Dispense:  150 tablet    Refill:  0    Do not place this medication, or any other prescription from our practice, on "Automatic Refill". Patient may have prescription filled one day early if pharmacy is closed on scheduled refill date. Do not fill until: 12/30/15 To  last until: 01/29/16  . oxyCODONE (OXY IR/ROXICODONE)  5 MG immediate release tablet    Sig: Take 1 tablet (5 mg total) by mouth 5 (five) times daily as needed for severe pain.    Dispense:  150 tablet    Refill:  0    Do not place this medication, or any other prescription from our practice, on "Automatic Refill". Patient may have prescription filled one day early if pharmacy is closed on scheduled refill date. Do not fill until: 01/29/16 To last until: 02/28/16  . Magnesium Oxide 500 MG CAPS    Sig: Take 1 capsule (500 mg total) by mouth 2 (two) times daily at 8 am and 10 pm.    Dispense:  100 capsule    Refill:  PRN    Do not place this medication, or any other prescription from our practice, on "Automatic Refill". Patient may have prescription filled one day early if pharmacy is closed on scheduled refill date.  . Wheat Dextrin (BENEFIBER) POWD    Sig: Stir 2 tsp. TID into 4-8 oz of any non-carbonated beverage or soft food (hot or cold)    Dispense:  500 g    Refill:  PRN    This is an OTC product. This prescription is to serve as a reminder to the patient as to our preference.    Lab-work & Procedure Ordered: Orders Placed This Encounter  Procedures  . CERVICAL EPIDURAL STEROID INJECTION  . CERVICAL EPIDURAL STEROID INJECTION  . CERVICAL FACET (MEDIAL BRANCH NERVE BLOCK)   . SHOULDER INJECTION  . SUPRASCAPULAR NERVE BLOCK  . ToxASSURE Select 13 (MW), Urine    Imaging Ordered: None  Interventional Therapies: Scheduled:  None at this time.    Considering:   1. Diagnostic bilateral cervical facet block under fluoroscopic guidance and IV sedation.  2. Possible cervical facet radiofrequency ablation.  3. Palliative right-sided cervical epidural steroid injection under fluoroscopic guidance, with or without sedation.  4. Diagnostic right intra-articular shoulder injection under fluoroscopic guidance, with or without sedation.  5. Diagnostic right suprascapular nerve block  under fluoroscopic guidance, with or without sedation.  6. Possible right suprascapular radiofrequency ablation.    PRN Procedures:   1. Diagnostic bilateral cervical facet block under fluoroscopic guidance and IV sedation.  2. Palliative right-sided cervical epidural steroid injection under fluoroscopic guidance, with or without sedation.  3. Diagnostic right intra-articular shoulder injection under fluoroscopic guidance, with or without sedation.  4. Diagnostic right suprascapular nerve block under fluoroscopic guidance, with or without sedation.    Referral(s) or Consult(s): None at this time.  New Prescriptions   MAGNESIUM OXIDE 500 MG CAPS    Take 1 capsule (500 mg total) by mouth 2 (two) times daily at 8 am and 10 pm.   WHEAT DEXTRIN (BENEFIBER) POWD    Stir 2 tsp. TID into 4-8 oz of any non-carbonated beverage or soft food (hot or cold)    Medications administered during this visit: Mr. Dollar had no medications administered during this visit.  Requested PM Follow-up: Return in about 2 months (around 02/13/2016) for Medication Management, (3-Mo), Procedure (PRN - Patient will call).  Future Appointments Date Time Provider Department Center  02/13/2016 1:40 PM Delano Metz, MD [facility] None    Primary Care Physician: Sherrie Mustache, MD Location: [facility] Note by: Sydnee Levans. Laban Emperor, M.D, DABA, DABAPM, DABPM, DABIPP, FIPP  Pain Score Disclaimer: We use the NRS-11 scale. This is a self-reported, subjective measurement of pain severity with only modest accuracy. It is used primarily to identify  changes within a particular patient. It must be understood that outpatient pain scales are significantly less accurate that those used for research, where they can be applied under ideal controlled circumstances with minimal exposure to variables. In reality, the score is likely to be a combination of pain intensity and pain affect, where pain affect  describes the degree of emotional arousal or changes in action readiness caused by the sensory experience of pain. Factors such as social and work situation, setting, emotional state, anxiety levels, expectation, and prior pain experience may influence pain perception and show large inter-individual differences that may also be affected by time variables.  Patient instructions provided during this appointment: Patient Instructions  Epidural Steroid Injection Patient Information  Description: The epidural space surrounds the nerves as they exit the spinal cord.  In some patients, the nerves can be compressed and inflamed by a bulging disc or a tight spinal canal (spinal stenosis).  By injecting steroids into the epidural space, we can bring irritated nerves into direct contact with a potentially helpful medication.  These steroids act directly on the irritated nerves and can reduce swelling and inflammation which often leads to decreased pain.  Epidural steroids may be injected anywhere along the spine and from the neck to the low back depending upon the location of your pain.   After numbing the skin with local anesthetic (like Novocaine), a small needle is passed into the epidural space slowly.  You may experience a sensation of pressure while this is being done.  The entire block usually last less than 10 minutes.  Conditions which may be treated by epidural steroids:   Low back and leg pain  Neck and arm pain  Spinal stenosis  Post-laminectomy syndrome  Herpes zoster (shingles) pain  Pain from compression fractures  Preparation for the injection:  5. Do not eat any solid food or dairy products within 8 hours of your appointment.  6. You may drink clear liquids up to 3 hours before appointment.  Clear liquids include water, black coffee, juice or soda.  No milk or cream please. 7. You may take your regular medication, including pain medications, with a sip of water before your  appointment  Diabetics should hold regular insulin (if taken separately) and take 1/2 normal NPH dos the morning of the procedure.  Carry some sugar containing items with you to your appointment. 8. A driver must accompany you and be prepared to drive you home after your procedure.  9. Bring all your current medications with your. 10. An IV may be inserted and sedation may be given at the discretion of the physician.   11. A blood pressure cuff, EKG and other monitors will often be applied during the procedure.  Some patients may need to have extra oxygen administered for a short period. 12. You will be asked to provide medical information, including your allergies, prior to the procedure.  We must know immediately if you are taking blood thinners (like Coumadin/Warfarin)  Or if you are allergic to IV iodine contrast (dye). We must know if you could possible be pregnant.  Possible side-effects:  Bleeding from needle site  Infection (rare, may require surgery)  Nerve injury (rare)  Numbness & tingling (temporary)  Difficulty urinating (rare, temporary)  Spinal headache ( a headache worse with upright posture)  Light -headedness (temporary)  Pain at injection site (several days)  Decreased blood pressure (temporary)  Weakness in arm/leg (temporary)  Pressure sensation in back/neck (temporary)  Call if you  experience:  Fever/chills associated with headache or increased back/neck pain.  Headache worsened by an upright position.  New onset weakness or numbness of an extremity below the injection site  Hives or difficulty breathing (go to the emergency room)  Inflammation or drainage at the infection site  Severe back/neck pain  Any new symptoms which are concerning to you  Please note:  Although the local anesthetic injected can often make your back or neck feel good for several hours after the injection, the pain will likely return.  It takes 3-7 days for steroids to work  in the epidural space.  You may not notice any pain relief for at least that one week.  If effective, we will often do a series of three injections spaced 3-6 weeks apart to maximally decrease your pain.  After the initial series, we generally will wait several months before considering a repeat injection of the same type.  If you have any questions, please call [phone] [facility]

## 2015-11-30 NOTE — Patient Instructions (Signed)
Epidural Steroid Injection Patient Information  Description: The epidural space surrounds the nerves as they exit the spinal cord.  In some patients, the nerves can be compressed and inflamed by a bulging disc or a tight spinal canal (spinal stenosis).  By injecting steroids into the epidural space, we can bring irritated nerves into direct contact with a potentially helpful medication.  These steroids act directly on the irritated nerves and can reduce swelling and inflammation which often leads to decreased pain.  Epidural steroids may be injected anywhere along the spine and from the neck to the low back depending upon the location of your pain.   After numbing the skin with local anesthetic (like Novocaine), a small needle is passed into the epidural space slowly.  You may experience a sensation of pressure while this is being done.  The entire block usually last less than 10 minutes.  Conditions which may be treated by epidural steroids:   Low back and leg pain  Neck and arm pain  Spinal stenosis  Post-laminectomy syndrome  Herpes zoster (shingles) pain  Pain from compression fractures  Preparation for the injection:  1. Do not eat any solid food or dairy products within 8 hours of your appointment.  2. You may drink clear liquids up to 3 hours before appointment.  Clear liquids include water, black coffee, juice or soda.  No milk or cream please. 3. You may take your regular medication, including pain medications, with a sip of water before your appointment  Diabetics should hold regular insulin (if taken separately) and take 1/2 normal NPH dos the morning of the procedure.  Carry some sugar containing items with you to your appointment. 4. A driver must accompany you and be prepared to drive you home after your procedure.  5. Bring all your current medications with your. 6. An IV may be inserted and sedation may be given at the discretion of the physician.   7. A blood pressure  cuff, EKG and other monitors will often be applied during the procedure.  Some patients may need to have extra oxygen administered for a short period. 8. You will be asked to provide medical information, including your allergies, prior to the procedure.  We must know immediately if you are taking blood thinners (like Coumadin/Warfarin)  Or if you are allergic to IV iodine contrast (dye). We must know if you could possible be pregnant.  Possible side-effects:  Bleeding from needle site  Infection (rare, may require surgery)  Nerve injury (rare)  Numbness & tingling (temporary)  Difficulty urinating (rare, temporary)  Spinal headache ( a headache worse with upright posture)  Light -headedness (temporary)  Pain at injection site (several days)  Decreased blood pressure (temporary)  Weakness in arm/leg (temporary)  Pressure sensation in back/neck (temporary)  Call if you experience:  Fever/chills associated with headache or increased back/neck pain.  Headache worsened by an upright position.  New onset weakness or numbness of an extremity below the injection site  Hives or difficulty breathing (go to the emergency room)  Inflammation or drainage at the infection site  Severe back/neck pain  Any new symptoms which are concerning to you  Please note:  Although the local anesthetic injected can often make your back or neck feel good for several hours after the injection, the pain will likely return.  It takes 3-7 days for steroids to work in the epidural space.  You may not notice any pain relief for at least that one week.    If effective, we will often do a series of three injections spaced 3-6 weeks apart to maximally decrease your pain.  After the initial series, we generally will wait several months before considering a repeat injection of the same type.  If you have any questions, please call [phone] [facility] 

## 2015-12-01 LAB — VITAMIN D 25 HYDROXY (VIT D DEFICIENCY, FRACTURES): Vit D, 25-Hydroxy: 22.4 ng/mL — ABNORMAL LOW (ref 30.0–100.0)

## 2015-12-08 LAB — TOXASSURE SELECT 13 (MW), URINE: PDF: 0

## 2015-12-12 ENCOUNTER — Inpatient Hospital Stay (HOSPITAL_COMMUNITY)
Admission: EM | Admit: 2015-12-12 | Discharge: 2015-12-18 | DRG: 175 | Disposition: A | Discharge status: Home / Self Care | Payer: Medicare Other | Attending: Internal Medicine | Admitting: Internal Medicine

## 2015-12-12 ENCOUNTER — Emergency Department (HOSPITAL_COMMUNITY): Payer: Medicare Other

## 2015-12-12 ENCOUNTER — Encounter (HOSPITAL_COMMUNITY): Payer: Self-pay | Admitting: Emergency Medicine

## 2015-12-12 DIAGNOSIS — I10 Essential (primary) hypertension: Secondary | ICD-10-CM | POA: Diagnosis present

## 2015-12-12 DIAGNOSIS — S0993XA Unspecified injury of face, initial encounter: Secondary | ICD-10-CM | POA: Diagnosis not present

## 2015-12-12 DIAGNOSIS — I959 Hypotension, unspecified: Secondary | ICD-10-CM | POA: Diagnosis present

## 2015-12-12 DIAGNOSIS — J9601 Acute respiratory failure with hypoxia: Secondary | ICD-10-CM | POA: Diagnosis not present

## 2015-12-12 DIAGNOSIS — S022XXA Fracture of nasal bones, initial encounter for closed fracture: Secondary | ICD-10-CM | POA: Diagnosis present

## 2015-12-12 DIAGNOSIS — I4891 Unspecified atrial fibrillation: Secondary | ICD-10-CM | POA: Diagnosis present

## 2015-12-12 DIAGNOSIS — I2602 Saddle embolus of pulmonary artery with acute cor pulmonale: Secondary | ICD-10-CM

## 2015-12-12 DIAGNOSIS — R55 Syncope and collapse: Secondary | ICD-10-CM | POA: Diagnosis present

## 2015-12-12 DIAGNOSIS — N179 Acute kidney failure, unspecified: Secondary | ICD-10-CM | POA: Diagnosis present

## 2015-12-12 DIAGNOSIS — S022XXB Fracture of nasal bones, initial encounter for open fracture: Secondary | ICD-10-CM | POA: Diagnosis present

## 2015-12-12 DIAGNOSIS — R0602 Shortness of breath: Secondary | ICD-10-CM | POA: Diagnosis not present

## 2015-12-12 DIAGNOSIS — I272 Other secondary pulmonary hypertension: Secondary | ICD-10-CM | POA: Diagnosis not present

## 2015-12-12 DIAGNOSIS — Z981 Arthrodesis status: Secondary | ICD-10-CM

## 2015-12-12 DIAGNOSIS — R739 Hyperglycemia, unspecified: Secondary | ICD-10-CM | POA: Diagnosis present

## 2015-12-12 DIAGNOSIS — I2692 Saddle embolus of pulmonary artery without acute cor pulmonale: Secondary | ICD-10-CM

## 2015-12-12 DIAGNOSIS — R509 Fever, unspecified: Secondary | ICD-10-CM

## 2015-12-12 DIAGNOSIS — T148 Other injury of unspecified body region: Secondary | ICD-10-CM | POA: Diagnosis not present

## 2015-12-12 DIAGNOSIS — S199XXA Unspecified injury of neck, initial encounter: Secondary | ICD-10-CM | POA: Diagnosis not present

## 2015-12-12 HISTORY — DX: Personal history of other diseases of the circulatory system: Z86.79

## 2015-12-12 LAB — COMPREHENSIVE METABOLIC PANEL
ALBUMIN: 3.2 g/dL — AB (ref 3.5–5.0)
ALT: 61 U/L (ref 17–63)
ANION GAP: 11 (ref 5–15)
AST: 126 U/L — AB (ref 15–41)
Alkaline Phosphatase: 84 U/L (ref 38–126)
BILIRUBIN TOTAL: 2.1 mg/dL — AB (ref 0.3–1.2)
BUN: 12 mg/dL (ref 6–20)
CO2: 22 mmol/L (ref 22–32)
Calcium: 8.2 mg/dL — ABNORMAL LOW (ref 8.9–10.3)
Chloride: 102 mmol/L (ref 101–111)
Creatinine, Ser: 1.43 mg/dL — ABNORMAL HIGH (ref 0.61–1.24)
GFR calc Af Amer: >60 mL/min (ref >60)
GFR calc non Af Amer: 57 mL/min — ABNORMAL LOW (ref >60)
GLUCOSE: 150 mg/dL — AB (ref 65–99)
POTASSIUM: 3.9 mmol/L (ref 3.5–5.1)
SODIUM: 135 mmol/L (ref 135–145)
Total Protein: 6.6 g/dL (ref 6.5–8.1)

## 2015-12-12 LAB — I-STAT CHEM 8, ED
BUN: 18 mg/dL (ref 6–20)
CHLORIDE: 101 mmol/L (ref 101–111)
CREATININE: 1.2 mg/dL (ref 0.61–1.24)
Calcium, Ion: 0.97 mmol/L — ABNORMAL LOW (ref 1.12–1.23)
GLUCOSE: 147 mg/dL — AB (ref 65–99)
HEMATOCRIT: 52 % (ref 39.0–52.0)
HEMOGLOBIN: 17.7 g/dL — AB (ref 13.0–17.0)
POTASSIUM: 4.4 mmol/L (ref 3.5–5.1)
Sodium: 138 mmol/L (ref 135–145)
TCO2: 26 mmol/L (ref 0–100)

## 2015-12-12 LAB — CBC WITH DIFFERENTIAL/PLATELET
Basophils Absolute: 0 10*3/uL (ref 0.0–0.1)
Basophils Relative: 0 %
EOS PCT: 1 %
Eosinophils Absolute: 0.1 10*3/uL (ref 0.0–0.7)
HEMATOCRIT: 49 % (ref 39.0–52.0)
Hemoglobin: 15.8 g/dL (ref 13.0–17.0)
LYMPHS ABS: 1.7 10*3/uL (ref 0.7–4.0)
LYMPHS PCT: 13 %
MCH: 30.1 pg (ref 26.0–34.0)
MCHC: 32.2 g/dL (ref 30.0–36.0)
MCV: 93.3 fL (ref 78.0–100.0)
MONO ABS: 1.1 10*3/uL — AB (ref 0.1–1.0)
MONOS PCT: 9 %
NEUTROS ABS: 9.8 10*3/uL — AB (ref 1.7–7.7)
Neutrophils Relative %: 77 %
PLATELETS: 128 10*3/uL — AB (ref 150–400)
RBC: 5.25 MIL/uL (ref 4.22–5.81)
RDW: 13.3 % (ref 11.5–15.5)
WBC: 12.7 10*3/uL — ABNORMAL HIGH (ref 4.0–10.5)

## 2015-12-12 LAB — PROTIME-INR
INR: 1.14 (ref 0.00–1.49)
Prothrombin Time: 14.8 seconds (ref 11.6–15.2)

## 2015-12-12 LAB — I-STAT TROPONIN, ED: TROPONIN I, POC: 0.07 ng/mL (ref 0.00–0.08)

## 2015-12-12 MED ORDER — DILTIAZEM HCL 100 MG IV SOLR
5.0000 mg/h | INTRAVENOUS | Status: DC
Start: 2015-12-12 — End: 2015-12-13
  Administered 2015-12-12: 5 mg/h via INTRAVENOUS
  Filled 2015-12-12: qty 100

## 2015-12-12 MED ORDER — SODIUM CHLORIDE 0.9 % IV BOLUS (SEPSIS)
1000.0000 mL | Freq: Once | INTRAVENOUS | Status: AC
  Administered 2015-12-12: 1000 mL via INTRAVENOUS

## 2015-12-12 MED ORDER — DILTIAZEM LOAD VIA INFUSION
10.0000 mg | Freq: Once | INTRAVENOUS | Status: AC
  Administered 2015-12-12: 10 mg via INTRAVENOUS
  Filled 2015-12-12: qty 10

## 2015-12-12 NOTE — ED Provider Notes (Signed)
CSN: [id number]     Arrival date & time 12/12/15  2230 History   First MD Initiated Contact with Patient 12/12/15 2232     No chief complaint on file.     The history is provided by the patient and the EMS personnel. No language interpreter was used.  Jared Bass is a 45 y.o. male who presents to the Emergency Department complaining of SOB.  Patient presents via EMS for evaluation of shortness of breath has been ongoing for the last 4 days. He has a history of atrial fibrillation but has not been taking his propanolol for this for the last month. He states he has been trying to control his symptoms with his breathing. Over the last 4 days he has had increased shortness of breath and cough with associated chest pain. He had a syncopal event today and hit his head. His sister called EMS because he was too sick to call. Per EMS he was in a atrial fibrillation with RVR. They gave him adenosine 6 mg, 12 mg, 12 mg with no change in his rhythm. They did have intermittent episodes of hypotension into the 70s. He received 1 L normal saline prior to ED arrival.  Past Medical History  Diagnosis Date  . Chronic neck pain   . Right eye injury   . Hypertension   . History of tachycardia 05/04/2015  . History of head injury 05/04/2015  . DDD (degenerative disc disease), cervical 05/04/2015   Past Surgical History  Procedure Laterality Date  . Neck fusion      c5-7   Family History  Problem Relation Age of Onset  . Hypertension Mother   . COPD Mother   . Cancer Father   . COPD Father    Social History  Substance Use Topics  . Smoking status: Never Smoker   . Smokeless tobacco: None  . Alcohol Use: No    Review of Systems  All other systems reviewed and are negative.     Allergies  Tramadol and Other  Home Medications   Prior to Admission medications   Medication Sig Start Date End Date Taking? Authorizing Provider  gabapentin (NEURONTIN) 800 MG tablet Take 1 tablet (800 mg total) by  mouth every 6 (six) hours. 11/30/15   Delano Metz, MD  Magnesium Oxide 500 MG CAPS Take 1 capsule (500 mg total) by mouth 2 (two) times daily at 8 am and 10 pm. 11/30/15   Delano Metz, MD  oxyCODONE (OXY IR/ROXICODONE) 5 MG immediate release tablet Take 1 tablet (5 mg total) by mouth 5 (five) times daily as needed for severe pain. 11/30/15   Delano Metz, MD  oxyCODONE (OXY IR/ROXICODONE) 5 MG immediate release tablet Take 1 tablet (5 mg total) by mouth 5 (five) times daily as needed for severe pain. 11/30/15   Delano Metz, MD  oxyCODONE (OXY IR/ROXICODONE) 5 MG immediate release tablet Take 1 tablet (5 mg total) by mouth 5 (five) times daily as needed for severe pain. 11/30/15   Delano Metz, MD  propranolol (INDERAL) 20 MG tablet Take 20 mg by mouth 3 (three) times daily.    Historical Provider, MD  tizanidine (ZANAFLEX) 2 MG capsule Take 1 capsule (2 mg total) by mouth 3 (three) times daily as needed for muscle spasms. 11/30/15   Delano Metz, MD  Wheat Dextrin (BENEFIBER) POWD Stir 2 tsp. TID into 4-8 oz of any non-carbonated beverage or soft food (hot or cold) 11/30/15   Delano Metz, MD  BP 108/78 mmHg  Pulse 138  Temp(Src) 98.3 F (36.8 C) (Oral)  Resp 24  Ht 5\' 8"  (1.727 m)  Wt 218 lb (98.884 kg)  BMI 33.15 kg/m2  SpO2 94% Physical Exam  Constitutional: He is oriented to person, place, and time. He appears well-developed and well-nourished. He appears distressed.  HENT:  Head: Normocephalic.  Laceration to the nasal bridge with blood in bilateral nares.  Abrasion to right upper lip.  Eyes:  Right pupil dilated, irregular and nonreactive (pt states this is chronic).  Left pupil midsized and reactive.   Neck:  No discrete C-spine tenderness  Cardiovascular: Regular rhythm.   No murmur heard. Tachycardic  Pulmonary/Chest: Breath sounds normal. No respiratory distress.  tachypneic  Abdominal: Soft. There is no tenderness. There is no rebound and no  guarding.  Musculoskeletal: He exhibits no edema or tenderness.  Neurological: He is alert and oriented to person, place, and time.  Skin: Skin is warm. He is diaphoretic. There is pallor.  Delayed cap refill and mottling to the lower abdominal wall  Psychiatric: He has a normal mood and affect. His behavior is normal.  Nursing note and vitals reviewed.   ED Course  Procedures  CRITICAL CARE Performed by: Tilden Fossa   Total critical care time: 45 minutes  Critical care time was exclusive of separately billable procedures and treating other patients.  Critical care was necessary to treat or prevent imminent or life-threatening deterioration.  Critical care was time spent personally by me on the following activities: development of treatment plan with patient and/or surrogate as well as nursing, discussions with consultants, evaluation of patient's response to treatment, examination of patient, obtaining history from patient or surrogate, ordering and performing treatments and interventions, ordering and review of laboratory studies, ordering and review of radiographic studies, pulse oximetry and re-evaluation of patient's condition.  Labs Review Labs Reviewed  COMPREHENSIVE METABOLIC PANEL - Abnormal; Notable for the following:    Glucose, Bld 150 (*)    Creatinine, Ser 1.43 (*)    Calcium 8.2 (*)    Albumin 3.2 (*)    AST 126 (*)    Total Bilirubin 2.1 (*)    GFR calc non Af Amer 57 (*)    All other components within normal limits  CBC WITH DIFFERENTIAL/PLATELET - Abnormal; Notable for the following:    WBC 12.7 (*)    Platelets 128 (*)    Neutro Abs 9.8 (*)    Monocytes Absolute 1.1 (*)    All other components within normal limits  I-STAT CHEM 8, ED - Abnormal; Notable for the following:    Glucose, Bld 147 (*)    Calcium, Ion 0.97 (*)    Hemoglobin 17.7 (*)    All other components within normal limits  PROTIME-INR  HEPARIN LEVEL (UNFRACTIONATED)  Rosezena Sensor,  ED    Imaging Review Dg Chest Port 1 View  12/12/2015  CLINICAL DATA:  Acute onset of syncope. Head injury. Atrial fibrillation. Initial encounter. EXAM: PORTABLE CHEST 1 VIEW COMPARISON:  Chest radiograph performed 10/14/2013 FINDINGS: The lungs are well-aerated. Pulmonary vascularity is at the upper limits of normal. There is no evidence of focal opacification, pleural effusion or pneumothorax. The cardiomediastinal silhouette is mildly enlarged. No acute osseous abnormalities are seen. External pacing pads are noted. Cervical spinal fusion hardware is partially imaged. IMPRESSION: Mild cardiomegaly.  Lungs remain grossly clear. Electronically Signed   By: Roanna Raider M.D.   On: 12/12/2015 23:11   I have personally reviewed  and evaluated these images and lab results as part of my medical decision-making.   EKG Interpretation   Date/Time:  Monday December 12 2015 23:43:00 EDT Ventricular Rate:  144 PR Interval:  172 QRS Duration: 90 QT Interval:  296 QTC Calculation: 458 R Axis:   109 Text Interpretation:  Sinus tachycardia Right axis deviation Probable  anteroseptal infarct, old Nonspecific T abnormalities, inferior leads  Confirmed by Lincoln Brigham [phone]) on 12/13/2015 12:43:49 AM      MDM   Final diagnoses:  Saddle pulmonary embolus [facility])   Patient here for evaluation of shortness of breath, syncope. Initial presenting rhythm was concerning for SVT versus a flutter with variable block. He had no response in this rhythm to adenosine prior to ED arrival. He was started on Cardizem with improvement in his heart rate to the 140s, repeat EKG is consistent with sinus tachycardia. The Cardizem was discontinued. CT PE study is consistent with large pulmonary embolism. Patient started on heparin drip. PCCM consulted and patient updated of findings of studies.  Pt with nasal laceration - he declines sutures at this time.       Tilden Fossa, MD 12/13/15 1517

## 2015-12-13 ENCOUNTER — Other Ambulatory Visit (HOSPITAL_COMMUNITY): Payer: Medicare Other

## 2015-12-13 ENCOUNTER — Emergency Department (HOSPITAL_COMMUNITY): Payer: Medicare Other

## 2015-12-13 ENCOUNTER — Encounter (HOSPITAL_COMMUNITY): Payer: Self-pay | Admitting: Pulmonary Disease

## 2015-12-13 ENCOUNTER — Inpatient Hospital Stay (HOSPITAL_COMMUNITY): Payer: Medicare Other

## 2015-12-13 DIAGNOSIS — I2692 Saddle embolus of pulmonary artery without acute cor pulmonale: Secondary | ICD-10-CM

## 2015-12-13 DIAGNOSIS — N179 Acute kidney failure, unspecified: Secondary | ICD-10-CM | POA: Diagnosis not present

## 2015-12-13 DIAGNOSIS — I272 Other secondary pulmonary hypertension: Secondary | ICD-10-CM | POA: Diagnosis not present

## 2015-12-13 DIAGNOSIS — R000 Tachycardia, unspecified: Secondary | ICD-10-CM | POA: Diagnosis not present

## 2015-12-13 DIAGNOSIS — I2602 Saddle embolus of pulmonary artery with acute cor pulmonale: Secondary | ICD-10-CM | POA: Diagnosis not present

## 2015-12-13 DIAGNOSIS — I959 Hypotension, unspecified: Secondary | ICD-10-CM | POA: Diagnosis present

## 2015-12-13 DIAGNOSIS — I2699 Other pulmonary embolism without acute cor pulmonale: Secondary | ICD-10-CM | POA: Diagnosis not present

## 2015-12-13 DIAGNOSIS — J9601 Acute respiratory failure with hypoxia: Secondary | ICD-10-CM | POA: Diagnosis not present

## 2015-12-13 DIAGNOSIS — I4891 Unspecified atrial fibrillation: Secondary | ICD-10-CM | POA: Diagnosis present

## 2015-12-13 DIAGNOSIS — S022XXA Fracture of nasal bones, initial encounter for closed fracture: Secondary | ICD-10-CM | POA: Diagnosis not present

## 2015-12-13 DIAGNOSIS — Z981 Arthrodesis status: Secondary | ICD-10-CM | POA: Diagnosis not present

## 2015-12-13 DIAGNOSIS — R739 Hyperglycemia, unspecified: Secondary | ICD-10-CM

## 2015-12-13 DIAGNOSIS — R0602 Shortness of breath: Secondary | ICD-10-CM | POA: Diagnosis not present

## 2015-12-13 DIAGNOSIS — S199XXA Unspecified injury of neck, initial encounter: Secondary | ICD-10-CM | POA: Diagnosis not present

## 2015-12-13 DIAGNOSIS — I10 Essential (primary) hypertension: Secondary | ICD-10-CM | POA: Diagnosis present

## 2015-12-13 DIAGNOSIS — S022XXB Fracture of nasal bones, initial encounter for open fracture: Secondary | ICD-10-CM | POA: Diagnosis present

## 2015-12-13 DIAGNOSIS — I9589 Other hypotension: Secondary | ICD-10-CM | POA: Diagnosis not present

## 2015-12-13 DIAGNOSIS — R55 Syncope and collapse: Secondary | ICD-10-CM | POA: Diagnosis not present

## 2015-12-13 DIAGNOSIS — R05 Cough: Secondary | ICD-10-CM | POA: Diagnosis not present

## 2015-12-13 HISTORY — DX: Saddle embolus of pulmonary artery without acute cor pulmonale: I26.92

## 2015-12-13 LAB — COMPREHENSIVE METABOLIC PANEL
ALT: 60 U/L (ref 17–63)
AST: 114 U/L — ABNORMAL HIGH (ref 15–41)
Albumin: 3.1 g/dL — ABNORMAL LOW (ref 3.5–5.0)
Alkaline Phosphatase: 79 U/L (ref 38–126)
Anion gap: 8 (ref 5–15)
BUN: 11 mg/dL (ref 6–20)
CO2: 21 mmol/L — ABNORMAL LOW (ref 22–32)
Calcium: 8.2 mg/dL — ABNORMAL LOW (ref 8.9–10.3)
Chloride: 106 mmol/L (ref 101–111)
Creatinine, Ser: 1.11 mg/dL (ref 0.61–1.24)
GFR calc Af Amer: >60 mL/min (ref >60)
GFR calc non Af Amer: >60 mL/min (ref >60)
Glucose, Bld: 119 mg/dL — ABNORMAL HIGH (ref 65–99)
Potassium: 3.8 mmol/L (ref 3.5–5.1)
Sodium: 135 mmol/L (ref 135–145)
Total Bilirubin: 1.4 mg/dL — ABNORMAL HIGH (ref 0.3–1.2)
Total Protein: 6.4 g/dL — ABNORMAL LOW (ref 6.5–8.1)

## 2015-12-13 LAB — CBC
HEMATOCRIT: 46.9 % (ref 39.0–52.0)
HEMOGLOBIN: 15.3 g/dL (ref 13.0–17.0)
MCH: 29.7 pg (ref 26.0–34.0)
MCHC: 32.6 g/dL (ref 30.0–36.0)
MCV: 91.1 fL (ref 78.0–100.0)
Platelets: 129 10*3/uL — ABNORMAL LOW (ref 150–400)
RBC: 5.15 MIL/uL (ref 4.22–5.81)
RDW: 13.2 % (ref 11.5–15.5)
WBC: 10.5 10*3/uL (ref 4.0–10.5)

## 2015-12-13 LAB — GLUCOSE, CAPILLARY
GLUCOSE-CAPILLARY: 113 mg/dL — AB (ref 65–99)
GLUCOSE-CAPILLARY: 121 mg/dL — AB (ref 65–99)
Glucose-Capillary: 104 mg/dL — ABNORMAL HIGH (ref 65–99)
Glucose-Capillary: 97 mg/dL (ref 65–99)
Glucose-Capillary: 114 mg/dL — ABNORMAL HIGH (ref 65–99)

## 2015-12-13 LAB — TYPE AND SCREEN
ABO/RH(D): A POS
ANTIBODY SCREEN: NEGATIVE

## 2015-12-13 LAB — MRSA PCR SCREENING: MRSA by PCR: NEGATIVE

## 2015-12-13 LAB — TROPONIN I
TROPONIN I: 0.38 ng/mL — AB (ref <0.031)
Troponin I: 0.48 ng/mL — ABNORMAL HIGH (ref <0.031)
Troponin I: 0.36 ng/mL — ABNORMAL HIGH (ref <0.031)

## 2015-12-13 LAB — MAGNESIUM: MAGNESIUM: 1.7 mg/dL (ref 1.7–2.4)

## 2015-12-13 LAB — ABO/RH: ABO/RH(D): A POS

## 2015-12-13 LAB — HEPARIN LEVEL (UNFRACTIONATED)
Heparin Unfractionated: 0.33 IU/mL (ref 0.30–0.70)
Heparin Unfractionated: 0.38 IU/mL (ref 0.30–0.70)

## 2015-12-13 LAB — PHOSPHORUS: Phosphorus: 2.5 mg/dL (ref 2.5–4.6)

## 2015-12-13 LAB — BRAIN NATRIURETIC PEPTIDE: B Natriuretic Peptide: 235.6 pg/mL — ABNORMAL HIGH (ref 0.0–100.0)

## 2015-12-13 MED ORDER — GABAPENTIN 800 MG PO TABS
800.0000 mg | ORAL_TABLET | Freq: Four times a day (QID) | ORAL | Status: DC
  Filled 2015-12-13 (×2): qty 1

## 2015-12-13 MED ORDER — GABAPENTIN 400 MG PO CAPS
800.0000 mg | ORAL_CAPSULE | Freq: Four times a day (QID) | ORAL | Status: DC
  Administered 2015-12-13 – 2015-12-17 (×17): 800 mg via ORAL
  Filled 2015-12-13 (×16): qty 2

## 2015-12-13 MED ORDER — LIDOCAINE HCL 1 % IJ SOLN
INTRAMUSCULAR | Status: AC
  Filled 2015-12-13: qty 20

## 2015-12-13 MED ORDER — ONDANSETRON HCL 4 MG/2ML IJ SOLN
4.0000 mg | Freq: Four times a day (QID) | INTRAMUSCULAR | Status: DC | PRN
  Administered 2015-12-13: 4 mg via INTRAVENOUS
  Filled 2015-12-13: qty 2

## 2015-12-13 MED ORDER — INSULIN ASPART 100 UNIT/ML ~~LOC~~ SOLN
0.0000 [IU] | SUBCUTANEOUS | Status: DC
  Administered 2015-12-13 – 2015-12-14 (×2): 2 [IU] via SUBCUTANEOUS
  Administered 2015-12-14: 1 [IU] via SUBCUTANEOUS

## 2015-12-13 MED ORDER — HEPARIN (PORCINE) IN NACL 100-0.45 UNIT/ML-% IJ SOLN
1500.0000 [IU]/h | INTRAMUSCULAR | Status: DC
  Administered 2015-12-13: 1500 [IU]/h via INTRAVENOUS

## 2015-12-13 MED ORDER — SODIUM CHLORIDE 0.9 % IV SOLN: 250.0000 mL | INTRAVENOUS | Status: DC | PRN

## 2015-12-13 MED ORDER — HEPARIN (PORCINE) IN NACL 100-0.45 UNIT/ML-% IJ SOLN
1950.0000 [IU]/h | INTRAMUSCULAR | Status: AC
  Administered 2015-12-13: 1600 [IU]/h via INTRAVENOUS
  Administered 2015-12-14: 1750 [IU]/h via INTRAVENOUS
  Filled 2015-12-13 (×3): qty 250

## 2015-12-13 MED ORDER — HEPARIN BOLUS VIA INFUSION
4000.0000 [IU] | Freq: Once | INTRAVENOUS | Status: AC
  Administered 2015-12-13: 4000 [IU] via INTRAVENOUS
  Filled 2015-12-13: qty 4000

## 2015-12-13 MED ORDER — IOPAMIDOL (ISOVUE-370) INJECTION 76%
INTRAVENOUS | Status: AC
  Administered 2015-12-13: 100 mL
  Filled 2015-12-13: qty 100

## 2015-12-13 MED ORDER — IOPAMIDOL (ISOVUE-300) INJECTION 61%
INTRAVENOUS | Status: AC
  Filled 2015-12-13: qty 50

## 2015-12-13 NOTE — Progress Notes (Signed)
Case discussed with Dr. Archer Asa from IR.  The patient is hemodynamically stable on 3L O2, speaking full paragraphs, denies chest pain or shortness of breath.  Only discomfort is with coughing.  Given stability, will hold catheter directed thrombolysis at this point.  If clinical change, IR can be called back for EKOS.    Plan: Transition to SDU monitoring, discussed with MD Monitor clinically on heparin gtt Will ask TRH to follow as primary as of 6/14 am Clear liquid diet for now  Canary Brim, NP-C [facility] Pgr: [phone] or if no answer [phone] 12/13/2015, 11:50 AM  Alyson Reedy, M.D. [facility]monary/Critical Care Medicine. Pager: [phone]. After hours pager: [phone].

## 2015-12-13 NOTE — Progress Notes (Signed)
PULMONARY / CRITICAL CARE MEDICINE   Name: Jared Bass MRN: [id number] DOB: October 07, 1970    ADMISSION DATE:  12/12/2015 CONSULTATION DATE:  12/13/2015  REFERRING MD:  Tilden Fossa, M.D. / EDP  CHIEF COMPLAINT:  Saddle PE  HISTORY OF PRESENT ILLNESS:  Per the electronic medical record patient presented with a four-day history of dyspnea. Patient also endorses cough and associated chest discomfort. Patient had a syncopal event today where he struck his head. His sister subsequently contacted EMS. Upon arrival the patient was reportedly in atrial fibrillation with rapid ventricular response. Adenosine was administered with no change in his underlying rhythm. Patient did have intermittent episodes of hypotension and bolus IV fluid was administered prior to emergency department arrival. Patient was initiated on IV Cardizem for tachycardia. EKG on arrival showed sinus tachycardia and Cardizem was subsequently discontinued. Patient was found to have a pulmonary embolism on CT angiogram of the chest and subsequently was started on a heparin infusion. Patient confirms that over the last 4 days he has had significantly increased dyspnea on exertion as well as tachycardia. He stopped his beta blocker approximately one month ago. He reports over the last 4 days he has also had chest pain that he describes as tightness occurring only with coughing. He reports his cough is productive of a "bubbly white" substance. Denies any hemoptysis. He denies any pleurisy. He does endorse nausea and emesis of a "foamy" substance yesterday. Denies any leg swelling or pain. Denies any periods of immobility or recent travel. Patient did suffer a syncopal event today. He reports off and on sweats and denies any lymphadenopathy in his neck, groin, or axilla. Patient denies any diarrhea. He denies any history of gastric ulcers or bleeding.   SUBJECTIVE:  Pt reports he is deathly afraid of needles (covered in tattoos) and relays  inappropriate stories about S&M when discussing procedure for EKOS.  No acute medical events.  Hemodynamically stable. Denies chest pain unless he coughs.  Denies hx of anticoagulation for AF, only on propranolol previously.  Brother and grandfather has hx of blood clots.   VITAL SIGNS: BP 137/94 mmHg  Pulse 132  Temp(Src) 98.6 F (37 C) (Oral)  Resp 28  Ht 5\' 8"  (1.727 m)  Wt 213 lb 3 oz (96.7 kg)  BMI 32.42 kg/m2  SpO2 92%     INTAKE / OUTPUT: I/O last 3 completed shifts: In: 30 [I.V.:30] Out: 400 [Urine:400]  PHYSICAL EXAMINATION: General:  Awake. Alert. No acute distress.   Integument:  Warm & dry. No rash on exposed skin. Laceration over the bridge of his nose. Tattoos noted. Lymphatics:  No appreciated cervical or supraclavicular lymphadenoapthy. HEENT:  Moist mucus membranes. No oral ulcers. No scleral injection or icterus.  Cardiovascular:  Tachycardic with regular rhythm. No edema. No appreciable JVD.  Pulmonary:  Good aeration & clear to auscultation bilaterally. Normal work of breathing on room air. Abdomen: Soft. Normal bowel sounds. Mildly protuberant. Grossly nontender. Musculoskeletal:  Normal bulk and tone. Hand grip strength 5/5 bilaterally. No joint deformity or effusion appreciated. Neurological:  CN 2-12 grossly in tact. No meningismus. Moving all 4 extremities equally.  Psychiatric:  Mood and affect congruent. Speech normal rhythm, rate & tone.   LABS:  BMET  Recent Labs Lab 12/12/15 2248 12/12/15 2303 12/13/15 0234  NA 135 138 135  K 3.9 4.4 3.8  CL 102 101 106  CO2 22  --  21*  BUN 12 18 11   CREATININE 1.43* 1.20 1.11  GLUCOSE 150*  147* 119*    Electrolytes  Recent Labs Lab 12/12/15 2248 12/13/15 0234  CALCIUM 8.2* 8.2*  MG  --  1.7  PHOS  --  2.5    CBC  Recent Labs Lab 12/12/15 2248 12/12/15 2303 12/13/15 0234  WBC 12.7*  --  10.5  HGB 15.8 17.7* 15.3  HCT 49.0 52.0 46.9  PLT 128*  --  129*    Coag's  Recent  Labs Lab 12/12/15 2248  INR 1.14    Sepsis Markers No results for input(s): LATICACIDVEN, PROCALCITON, O2SATVEN in the last 168 hours.  ABG No results for input(s): PHART, PCO2ART, PO2ART in the last 168 hours.  Liver Enzymes  Recent Labs Lab 12/12/15 2248 12/13/15 0234  AST 126* 114*  ALT 61 60  ALKPHOS 84 79  BILITOT 2.1* 1.4*  ALBUMIN 3.2* 3.1*    Cardiac Enzymes  Recent Labs Lab 12/13/15 0234 12/13/15 0855  TROPONINI 0.38* 0.48*    Glucose  Recent Labs Lab 12/13/15 0425 12/13/15 0923  GLUCAP 113* 104*    Imaging Ct Head Wo Contrast  12/13/2015  CLINICAL DATA:  Acute onset of syncope and fall onto face. Concern for head or cervical spine injury. Initial encounter. EXAM: CT HEAD WITHOUT CONTRAST CT CERVICAL SPINE WITHOUT CONTRAST TECHNIQUE: Multidetector CT imaging of the head and cervical spine was performed following the standard protocol without intravenous contrast. Multiplanar CT image reconstructions of the cervical spine were also generated. COMPARISON:  CT of the cervical spine performed 08/27/2014, MRI of the cervical spine performed 10/09/2014, and CT of the head performed 05/12/2014 FINDINGS: CT HEAD FINDINGS There is no evidence of acute infarction, mass lesion, or intra- or extra-axial hemorrhage on CT. The posterior fossa, including the cerebellum, brainstem and fourth ventricle, is within normal limits. The third and lateral ventricles, and basal ganglia are unremarkable in appearance. The cerebral hemispheres are symmetric in appearance, with normal gray-white differentiation. No mass effect or midline shift is seen. There is a mildly comminuted fracture involving the nasal bone. The orbits are within normal limits. There is partial opacification of the sphenoid sinus. The remaining paranasal sinuses and mastoid air cells are well-aerated. No significant soft tissue abnormalities are seen. CT CERVICAL SPINE FINDINGS There is no evidence of fracture or  subluxation. Anterior cervical spinal fusion hardware is noted at C5-C7, with associated posterior osteophyte at C5-C6. Vertebral bodies demonstrate normal height and alignment. Intervertebral disc spaces are otherwise preserved. Prevertebral soft tissues are within normal limits. The thyroid gland is unremarkable in appearance. The visualized lung apices are clear. No significant soft tissue abnormalities are seen. IMPRESSION: 1. No evidence of traumatic intracranial injury. 2. Mildly comminuted fracture involving the nasal bone. 3. No evidence of fracture or subluxation along the cervical spine. 4. Partial opacification of the sphenoid sinus. 5. Anterior cervical spinal fusion at C5-C7, with underlying posterior osteophyte at C5-C6. Electronically Signed   By: Roanna Raider M.D.   On: 12/13/2015 01:39   Ct Angio Chest Pe W/cm &/or Wo Cm  12/13/2015  CLINICAL DATA:  Acute onset of syncope and fall. Concern for pulmonary embolus. Initial encounter. EXAM: CT ANGIOGRAPHY CHEST WITH CONTRAST TECHNIQUE: Multidetector CT imaging of the chest was performed using the standard protocol during bolus administration of intravenous contrast. Multiplanar CT image reconstructions and MIPs were obtained to evaluate the vascular anatomy. CONTRAST:  100 mL of Isovue 370 IV contrast COMPARISON:  Chest radiograph performed 12/12/2015 FINDINGS: Large saddle pulmonary embolus is noted, extending into all lobes of both  lungs, with apparent occlusion of many segmental branches. There is an RV/LV ratio of 2.1, compatible with significant right heart strain. Mild pulmonary infarct is noted at the right lung base. Mild patchy bilateral atelectasis is seen. Mild left-sided peribronchial thickening is seen. There is no evidence of pleural effusion or pneumothorax. No masses are identified; no abnormal focal contrast enhancement is seen. The mediastinum is otherwise unremarkable in appearance. No mediastinal lymphadenopathy is seen. No  pericardial effusion is identified. The great vessels are grossly unremarkable in appearance. No axillary lymphadenopathy is seen. The thyroid gland is unremarkable in appearance. The visualized portions of the liver and spleen are unremarkable. The visualized portions of the pancreas, gallbladder, stomach, adrenal glands and kidneys are within normal limits. No acute osseous abnormalities are seen. Anterior cervical spinal fusion hardware is noted. Review of the MIP images confirms the above findings. IMPRESSION: 1. Large saddle pulmonary embolus noted, extending into all lobes of both lungs, with apparent occlusion of many segmental branches. CT evidence of significant right heart strain (RV/LV Ratio = 2.1) consistent with at least submassive (intermediate risk) PE. The presence of right heart strain has been associated with an increased risk of morbidity and mortality. Please activate Code PE by paging [phone]. 2. Mild pulmonary infarct at the right lung base. Mild patchy bilateral atelectasis seen. Mild left-sided peribronchial thickening noted. Critical Value/emergent results were called by telephone at the time of interpretation on 12/13/2015 at 1:31 am to Dr. Tilden Fossa, who verbally acknowledged these results. Electronically Signed   By: Roanna Raider M.D.   On: 12/13/2015 01:33   Ct Cervical Spine Wo Contrast  12/13/2015  CLINICAL DATA:  Acute onset of syncope and fall onto face. Concern for head or cervical spine injury. Initial encounter. EXAM: CT HEAD WITHOUT CONTRAST CT CERVICAL SPINE WITHOUT CONTRAST TECHNIQUE: Multidetector CT imaging of the head and cervical spine was performed following the standard protocol without intravenous contrast. Multiplanar CT image reconstructions of the cervical spine were also generated. COMPARISON:  CT of the cervical spine performed 08/27/2014, MRI of the cervical spine performed 10/09/2014, and CT of the head performed 05/12/2014 FINDINGS: CT HEAD FINDINGS  There is no evidence of acute infarction, mass lesion, or intra- or extra-axial hemorrhage on CT. The posterior fossa, including the cerebellum, brainstem and fourth ventricle, is within normal limits. The third and lateral ventricles, and basal ganglia are unremarkable in appearance. The cerebral hemispheres are symmetric in appearance, with normal gray-white differentiation. No mass effect or midline shift is seen. There is a mildly comminuted fracture involving the nasal bone. The orbits are within normal limits. There is partial opacification of the sphenoid sinus. The remaining paranasal sinuses and mastoid air cells are well-aerated. No significant soft tissue abnormalities are seen. CT CERVICAL SPINE FINDINGS There is no evidence of fracture or subluxation. Anterior cervical spinal fusion hardware is noted at C5-C7, with associated posterior osteophyte at C5-C6. Vertebral bodies demonstrate normal height and alignment. Intervertebral disc spaces are otherwise preserved. Prevertebral soft tissues are within normal limits. The thyroid gland is unremarkable in appearance. The visualized lung apices are clear. No significant soft tissue abnormalities are seen. IMPRESSION: 1. No evidence of traumatic intracranial injury. 2. Mildly comminuted fracture involving the nasal bone. 3. No evidence of fracture or subluxation along the cervical spine. 4. Partial opacification of the sphenoid sinus. 5. Anterior cervical spinal fusion at C5-C7, with underlying posterior osteophyte at C5-C6. Electronically Signed   By: Roanna Raider M.D.   On: 12/13/2015 01:39  Dg Chest Port 1 View  12/12/2015  CLINICAL DATA:  Acute onset of syncope. Head injury. Atrial fibrillation. Initial encounter. EXAM: PORTABLE CHEST 1 VIEW COMPARISON:  Chest radiograph performed 10/14/2013 FINDINGS: The lungs are well-aerated. Pulmonary vascularity is at the upper limits of normal. There is no evidence of focal opacification, pleural effusion or  pneumothorax. The cardiomediastinal silhouette is mildly enlarged. No acute osseous abnormalities are seen. External pacing pads are noted. Cervical spinal fusion hardware is partially imaged. IMPRESSION: Mild cardiomegaly.  Lungs remain grossly clear. Electronically Signed   By: Roanna Raider M.D.   On: 12/12/2015 23:11     STUDIES:  EKG 6/12:  Sinus tachycardia. With inverted T waves across precordial leads.  CTA CHEST 6/13:  Saddle PE with clot extending into bilateral main pulmonary arteries and segmental arteries on my review. RV/LV ratio 2.1. Mild infarct right lung base. No pleural effusion or thickening. No pathologic mediastinal adenopathy.  CT HEAD/NECK W/O 6/13: No evidence for traumatic intracranial injury. Mild comminuted fracture involving nasal bone. No evidence of fracture or subluxation cervical spine. C5-7 fusion.  BLE Venous Duplex 6/13 Preliminary report: No evidence of DVT, superficial thrombosis, or Baker's cyst; sluggish blood flow bilaterally  MICROBIOLOGY: MRSA PCR >>  ANTIBIOTICS: NONE  SIGNIFICANT EVENTS: 6/13 - Admit  LINES/TUBES: PIV x2  DISCUSSION:  45 year old male presenting with 4 days of dyspnea and cough after syncopal event today. Patient has a saddle pulmonary embolus that is large extending into all lobes of both lungs and many segmental branches. Patient has evidence of RV strain on the CT angiogram of his chest. Repeat cardiac biomarkers are mildly elevated.  Given his hypotension responsive to fluid resuscitation, ongoing tachycardia, significant clot burden, and syncopal event, the patient would be an appropriate candidate for catheter directed lytic therapy.  However, he was set up for to go to IR for lytic therapy however initially changed his mind.   After family arrival the am of 6/13, the patient changed his mind and is willing to go for EKOS.      ASSESSMENT / PLAN:  HEMATOLOGIC A:   Saddle PE - Evidence of right heart strain on  CTA. Leukocytosis - Likely stress response with demargination.  P:  Trend Cell Counts daily w/ CBC Continuing Heparin gtt per pharmacy protocol Interventional Radiology consulted for possible catheter directed TPA, will call back for review.  He has facial lacerations from fall, ? If he would still be a candidate at this point given hemodynamic stability Trend Troponin I & BNP  T&S completed May need hematologic evaluation given hx of familial clot   PULMONARY A: Saddle PE Acute Hypoxic Respiratory Failure  P:   Monitor in ICU Wean FiO2 to maintain Sat >94% IR consult for lytic therapy, see above Heparin as above  CARDIOVASCULAR A:  Hypotension - Resolved with IVF. Likely secondary to right heart strain from PE.+ Tachycardia - (hx of off propranolol 1 month) and in the setting of massive PE Troponin leak Hx Afib- ?   P:  Telemetry Monitoring Vitals per Unit Protocol  RENAL A:   Acute Renal Failure - Likely due to hypotension.  P:   Trend UOP Monitor electrolytes & renal function daily Replace electrolytes as indicated  GASTROINTESTINAL A:   Transaminitis  P:   NPO Zofran IV prn Trending LFTs daily  INFECTIOUS A:   No acute issues.  P:   Monitor for fever  ENDOCRINE A:   Hyperglycemia    P:   Checking  Hgb A1c Accu-Checks q4hr SSI per low dose algorithm  NEUROLOGIC A:   Syncope - Presumably secondary to hypotension from acute PE.  P:   Close monitoring in ICU Holding home pain medications for now  MUSCULOSKELETAL/OTHER A: Nasal Bridge Laceration Mild Comminuted Nasal Bone Fracture  P: Patient refused suturing Monitor closely with systemic anticoagulation/lytic therapy    FAMILY  - Updates: Family updated at bedside on 6/13.  - Inter-disciplinary family meet or Palliative Care meeting due by:  6/20   Canary Brim, NP-C [facility] Pgr: [phone] or if no answer [phone] 12/13/2015, 10:23 AM  Attending  Note:  44 year old male with a PE who presents to the hospital after a syncopal episode with facial trauma.  Upon presentation he was admitted to the ICU for observation.  He is on 3L  but HR is 130-140.  Discussed at length with Dr. Archer Asa from IR.  Patient is doing well from a respiratory standpoint but hemodynamics are marginal.  Will transfer to SDU for observation, if worsens then will consider EKOS.  The patient is critically ill with multiple organ systems failure and requires high complexity decision making for assessment and support, frequent evaluation and titration of therapies, application of advanced monitoring technologies and extensive interpretation of multiple databases.   Critical Care Time devoted to patient care services described in this note is  35  Minutes. This time reflects time of care of this signee Dr Koren Bound. This critical care time does not reflect procedure time, or teaching time or supervisory time of PA/NP/Med student/Med Resident etc but could involve care discussion time.  Alyson Reedy, M.D. [facility] Pulmonary/Critical Care Medicine. Pager: [phone]. After hours pager: [phone].

## 2015-12-13 NOTE — ED Notes (Signed)
Pt coming from home. Pt woke up face down on the floor of his bedroom. Pt face visibly bloody. Laceration/hole noted to the bridge of his nose. Pt attempted to call 911 himself but 911 was unable to understand him. Pt sister called 911. Upon EMS arrival pt was SOB. Oxygen was applied at 3L [place]. Pt was applied to the monitor was and was found to be in SVT/Afib. Pts HR was found to be 160s and higher.  EMS gave the Adenosine 6mg ,12mg ,12mg . With no conversions. Pt also presents with a  Dry cough. Pt BP originally in the low 70s. EMS gave 1 L of NS. Pt BP inc to 100 systolic's.

## 2015-12-13 NOTE — Consult Note (Signed)
PULMONARY / CRITICAL CARE MEDICINE   Name: Jared Bass MRN: [id number] DOB: 08-28-1970    ADMISSION DATE:  12/12/2015 CONSULTATION DATE:  12/13/2015  REFERRING MD:  Tilden Fossa, M.D. / EDP  CHIEF COMPLAINT:  Saddle PE  HISTORY OF PRESENT ILLNESS:  Per the electronic medical record patient presented with a four-day history of dyspnea. Patient also endorses cough and associated chest discomfort. Patient had a syncopal event today where he struck his head. His sister subsequently contacted EMS. Upon arrival the patient was reportedly in atrial fibrillation with rapid ventricular response. Adenosine was administered with no change in his underlying rhythm. Patient did have intermittent episodes of hypotension and bolus IV fluid was administered prior to emergency department arrival. Patient was initiated on IV Cardizem for tachycardia. EKG on arrival showed sinus tachycardia and Cardizem was subsequently discontinued. Patient was found to have a pulmonary embolism on CT angiogram of the chest and subsequently was started on a heparin infusion. Patient confirms that over the last 4 days he has had significantly increased dyspnea on exertion as well as tachycardia. He stopped his beta blocker approximately one month ago. He reports over the last 4 days he has also had chest pain that he describes as tightness occurring only with coughing. He reports his cough is productive of a "bubbly white" substance. Denies any hemoptysis. He denies any pleurisy. He does endorse nausea and emesis of a "foamy" substance yesterday. Denies any leg swelling or pain. Denies any periods of immobility or recent travel. Patient did suffer a syncopal event today. He reports off and on sweats and denies any lymphadenopathy in his neck, groin, or axilla. Patient denies any diarrhea. He denies any history of gastric ulcers or bleeding.  PAST MEDICAL HISTORY :  Past Medical History  Diagnosis Date  . Chronic neck pain   . Right  eye injury   . Hypertension   . History of tachycardia 05/04/2015  . History of head injury 05/04/2015  . DDD (degenerative disc disease), cervical 05/04/2015  . History of atrial fibrillation     PAST SURGICAL HISTORY: Past Surgical History  Procedure Laterality Date  . Neck fusion      c5-7  . Tonsillectomy       Allergies  Allergen Reactions  . Tramadol Anaphylaxis and Rash  . Other Other (See Comments)    Rembrant Whitening strips  "fat lips"    No current facility-administered medications on file prior to encounter.   Current Outpatient Prescriptions on File Prior to Encounter  Medication Sig  . gabapentin (NEURONTIN) 800 MG tablet Take 1 tablet (800 mg total) by mouth every 6 (six) hours.  . Magnesium Oxide 500 MG CAPS Take 1 capsule (500 mg total) by mouth 2 (two) times daily at 8 am and 10 pm.  . oxyCODONE (OXY IR/ROXICODONE) 5 MG immediate release tablet Take 1 tablet (5 mg total) by mouth 5 (five) times daily as needed for severe pain.  Marland Kitchen oxyCODONE (OXY IR/ROXICODONE) 5 MG immediate release tablet Take 1 tablet (5 mg total) by mouth 5 (five) times daily as needed for severe pain.  Marland Kitchen oxyCODONE (OXY IR/ROXICODONE) 5 MG immediate release tablet Take 1 tablet (5 mg total) by mouth 5 (five) times daily as needed for severe pain.  Marland Kitchen propranolol (INDERAL) 20 MG tablet Take 20 mg by mouth 3 (three) times daily.  . tizanidine (ZANAFLEX) 2 MG capsule Take 1 capsule (2 mg total) by mouth 3 (three) times daily as needed for muscle spasms.  Marland Kitchen  Wheat Dextrin (BENEFIBER) POWD Stir 2 tsp. TID into 4-8 oz of any non-carbonated beverage or soft food (hot or cold)    FAMILY HISTORY:  Family History  Problem Relation Age of Onset  . Hypertension Mother   . COPD Mother   . Mesothelioma Father   . COPD Father   . Peripheral vascular disease Brother     Clots in his arteries surgically removed    SOCIAL HISTORY: Social History   Social History  . Marital Status: Single    Spouse  Name: N/A  . Number of Children: N/A  . Years of Education: N/A   Social History Main Topics  . Smoking status: Never Smoker   . Smokeless tobacco: None  . Alcohol Use: No  . Drug Use: No  . Sexual Activity: Not Asked   Other Topics Concern  . 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    REVIEW OF SYSTEMS:  No rashes or abnormal bruising. No hematuria. No dysuria. A pertinent 14 point review of systems is negative except as per the history of presenting illness.  SUBJECTIVE: As above.  VITAL SIGNS: BP 115/89 mmHg  Pulse 140  Temp(Src) 98.3 F (36.8 C) (Oral)  Resp 18  Ht 5\' 8"  (1.727 m)  Wt 218 lb (98.884 kg)  BMI 33.15 kg/m2  SpO2 90%  HEMODYNAMICS:    VENTILATOR SETTINGS:    INTAKE / OUTPUT:    PHYSICAL EXAMINATION: General:  Awake. Alert. No acute distress. No family at bedside.  Integument:  Warm & dry. No rash on exposed skin. Laceration over the bridge of his nose. Tattoos noted. Lymphatics:  No appreciated cervical or supraclavicular lymphadenoapthy. HEENT:  Moist mucus membranes. No oral ulcers. No scleral injection or icterus.  Cardiovascular:  Tachycardic with regular rhythm. No edema. No appreciable JVD.  Pulmonary:  Good aeration & clear to auscultation bilaterally. Normal work of breathing on room air. Abdomen: Soft. Normal bowel sounds. Mildly protuberant. Grossly nontender. Musculoskeletal:  Normal bulk and tone. Hand grip strength 5/5 bilaterally. No joint deformity or effusion appreciated. Neurological:  CN 2-12 grossly in tact. No meningismus. Moving all 4 extremities equally.  Psychiatric:  Mood and affect congruent. Speech normal rhythm, rate & tone.   LABS:  BMET  Recent Labs Lab 12/12/15 2248 12/12/15 2303  NA 135 138  K 3.9 4.4  CL 102 101  CO2 22  --   BUN 12 18  CREATININE 1.43* 1.20  GLUCOSE 150* 147*    Electrolytes  Recent Labs Lab 12/12/15 2248  CALCIUM 8.2*     CBC  Recent Labs Lab 12/12/15 2248 12/12/15 2303  WBC 12.7*  --   HGB 15.8 17.7*  HCT 49.0 52.0  PLT 128*  --     Coag's  Recent Labs Lab 12/12/15 2248  INR 1.14    Sepsis Markers No results for input(s): LATICACIDVEN, PROCALCITON, O2SATVEN in the last 168 hours.  ABG No results for input(s): PHART, PCO2ART, PO2ART in the last 168 hours.  Liver Enzymes  Recent Labs Lab 12/12/15 2248  AST 126*  ALT 61  ALKPHOS 84  BILITOT 2.1*  ALBUMIN 3.2*    Cardiac Enzymes No results for input(s): TROPONINI, PROBNP in the last 168 hours.  Glucose No results for input(s): GLUCAP in the last 168 hours.  Imaging Ct Head Wo Contrast  12/13/2015  CLINICAL DATA:  Acute onset of syncope and fall onto face. Concern for head or  cervical spine injury. Initial encounter. EXAM: CT HEAD WITHOUT CONTRAST CT CERVICAL SPINE WITHOUT CONTRAST TECHNIQUE: Multidetector CT imaging of the head and cervical spine was performed following the standard protocol without intravenous contrast. Multiplanar CT image reconstructions of the cervical spine were also generated. COMPARISON:  CT of the cervical spine performed 08/27/2014, MRI of the cervical spine performed 10/09/2014, and CT of the head performed 05/12/2014 FINDINGS: CT HEAD FINDINGS There is no evidence of acute infarction, mass lesion, or intra- or extra-axial hemorrhage on CT. The posterior fossa, including the cerebellum, brainstem and fourth ventricle, is within normal limits. The third and lateral ventricles, and basal ganglia are unremarkable in appearance. The cerebral hemispheres are symmetric in appearance, with normal gray-white differentiation. No mass effect or midline shift is seen. There is a mildly comminuted fracture involving the nasal bone. The orbits are within normal limits. There is partial opacification of the sphenoid sinus. The remaining paranasal sinuses and mastoid air cells are well-aerated. No significant soft tissue  abnormalities are seen. CT CERVICAL SPINE FINDINGS There is no evidence of fracture or subluxation. Anterior cervical spinal fusion hardware is noted at C5-C7, with associated posterior osteophyte at C5-C6. Vertebral bodies demonstrate normal height and alignment. Intervertebral disc spaces are otherwise preserved. Prevertebral soft tissues are within normal limits. The thyroid gland is unremarkable in appearance. The visualized lung apices are clear. No significant soft tissue abnormalities are seen. IMPRESSION: 1. No evidence of traumatic intracranial injury. 2. Mildly comminuted fracture involving the nasal bone. 3. No evidence of fracture or subluxation along the cervical spine. 4. Partial opacification of the sphenoid sinus. 5. Anterior cervical spinal fusion at C5-C7, with underlying posterior osteophyte at C5-C6. Electronically Signed   By: Roanna Raider M.D.   On: 12/13/2015 01:39   Ct Angio Chest Pe W/cm &/or Wo Cm  12/13/2015  CLINICAL DATA:  Acute onset of syncope and fall. Concern for pulmonary embolus. Initial encounter. EXAM: CT ANGIOGRAPHY CHEST WITH CONTRAST TECHNIQUE: Multidetector CT imaging of the chest was performed using the standard protocol during bolus administration of intravenous contrast. Multiplanar CT image reconstructions and MIPs were obtained to evaluate the vascular anatomy. CONTRAST:  100 mL of Isovue 370 IV contrast COMPARISON:  Chest radiograph performed 12/12/2015 FINDINGS: Large saddle pulmonary embolus is noted, extending into all lobes of both lungs, with apparent occlusion of many segmental branches. There is an RV/LV ratio of 2.1, compatible with significant right heart strain. Mild pulmonary infarct is noted at the right lung base. Mild patchy bilateral atelectasis is seen. Mild left-sided peribronchial thickening is seen. There is no evidence of pleural effusion or pneumothorax. No masses are identified; no abnormal focal contrast enhancement is seen. The mediastinum  is otherwise unremarkable in appearance. No mediastinal lymphadenopathy is seen. No pericardial effusion is identified. The great vessels are grossly unremarkable in appearance. No axillary lymphadenopathy is seen. The thyroid gland is unremarkable in appearance. The visualized portions of the liver and spleen are unremarkable. The visualized portions of the pancreas, gallbladder, stomach, adrenal glands and kidneys are within normal limits. No acute osseous abnormalities are seen. Anterior cervical spinal fusion hardware is noted. Review of the MIP images confirms the above findings. IMPRESSION: 1. Large saddle pulmonary embolus noted, extending into all lobes of both lungs, with apparent occlusion of many segmental branches. CT evidence of significant right heart strain (RV/LV Ratio = 2.1) consistent with at least submassive (intermediate risk) PE. The presence of right heart strain has been associated with an increased risk of  morbidity and mortality. Please activate Code PE by paging [phone]. 2. Mild pulmonary infarct at the right lung base. Mild patchy bilateral atelectasis seen. Mild left-sided peribronchial thickening noted. Critical Value/emergent results were called by telephone at the time of interpretation on 12/13/2015 at 1:31 am to Dr. Tilden Fossa, who verbally acknowledged these results. Electronically Signed   By: Roanna Raider M.D.   On: 12/13/2015 01:33   Ct Cervical Spine Wo Contrast  12/13/2015  CLINICAL DATA:  Acute onset of syncope and fall onto face. Concern for head or cervical spine injury. Initial encounter. EXAM: CT HEAD WITHOUT CONTRAST CT CERVICAL SPINE WITHOUT CONTRAST TECHNIQUE: Multidetector CT imaging of the head and cervical spine was performed following the standard protocol without intravenous contrast. Multiplanar CT image reconstructions of the cervical spine were also generated. COMPARISON:  CT of the cervical spine performed 08/27/2014, MRI of the cervical spine  performed 10/09/2014, and CT of the head performed 05/12/2014 FINDINGS: CT HEAD FINDINGS There is no evidence of acute infarction, mass lesion, or intra- or extra-axial hemorrhage on CT. The posterior fossa, including the cerebellum, brainstem and fourth ventricle, is within normal limits. The third and lateral ventricles, and basal ganglia are unremarkable in appearance. The cerebral hemispheres are symmetric in appearance, with normal gray-white differentiation. No mass effect or midline shift is seen. There is a mildly comminuted fracture involving the nasal bone. The orbits are within normal limits. There is partial opacification of the sphenoid sinus. The remaining paranasal sinuses and mastoid air cells are well-aerated. No significant soft tissue abnormalities are seen. CT CERVICAL SPINE FINDINGS There is no evidence of fracture or subluxation. Anterior cervical spinal fusion hardware is noted at C5-C7, with associated posterior osteophyte at C5-C6. Vertebral bodies demonstrate normal height and alignment. Intervertebral disc spaces are otherwise preserved. Prevertebral soft tissues are within normal limits. The thyroid gland is unremarkable in appearance. The visualized lung apices are clear. No significant soft tissue abnormalities are seen. IMPRESSION: 1. No evidence of traumatic intracranial injury. 2. Mildly comminuted fracture involving the nasal bone. 3. No evidence of fracture or subluxation along the cervical spine. 4. Partial opacification of the sphenoid sinus. 5. Anterior cervical spinal fusion at C5-C7, with underlying posterior osteophyte at C5-C6. Electronically Signed   By: Roanna Raider M.D.   On: 12/13/2015 01:39   Dg Chest Port 1 View  12/12/2015  CLINICAL DATA:  Acute onset of syncope. Head injury. Atrial fibrillation. Initial encounter. EXAM: PORTABLE CHEST 1 VIEW COMPARISON:  Chest radiograph performed 10/14/2013 FINDINGS: The lungs are well-aerated. Pulmonary vascularity is at the  upper limits of normal. There is no evidence of focal opacification, pleural effusion or pneumothorax. The cardiomediastinal silhouette is mildly enlarged. No acute osseous abnormalities are seen. External pacing pads are noted. Cervical spinal fusion hardware is partially imaged. IMPRESSION: Mild cardiomegaly.  Lungs remain grossly clear. Electronically Signed   By: Roanna Raider M.D.   On: 12/12/2015 23:11     STUDIES:  EKG 6/12:  Sinus tachycardia. With inverted T waves across precordial leads.  CTA CHEST 6/13:  Saddle PE with clot extending into bilateral main pulmonary arteries and segmental arteries on my review. RV/LV ratio 2.1. Mild infarct right lung base. No pleural effusion or thickening. No pathologic mediastinal adenopathy.  CT HEAD/NECK W/O 6/13: No evidence for traumatic intracranial injury. Mild comminuted fracture involving nasal bone. No evidence of fracture or subluxation cervical spine. C5-7 fusion.  MICROBIOLOGY: MRSA PCR >>  ANTIBIOTICS: NONE  SIGNIFICANT EVENTS: 6/13 - Admit  LINES/TUBES: PIV x2  DISCUSSION:  45 year old male presenting with 4 days of dyspnea and cough after syncopal event today. Patient has a saddle pulmonary embolus that is large extending into all lobes of both lungs and many segmental branches. Patient has evidence of RV strain on the CT angiogram of his chest. Repeat cardiac biomarkers are pending but given his hypotension responsive to fluid resuscitation, ongoing tachycardia, significant clot burden, and syncopal event I am concerned and feels the patient would be an appropriate candidate for catheter directed lytic therapy. I had a lengthy discussion with the patient regarding the potential risks of lytic therapy including bleeding from his nose as well as potential unseen bleeding into his brain, intestines, or elsewhere that could pose a risk to his life. The patient is willing to accept this risk and undergo lytic therapy given his degree of  dyspnea and symptoms. The patient will be continued on systemic anticoagulation with heparin at this time. I have personally spoken with Dr. Bonnielee Haff with interventional radiology who will be assessing the patient as well.   ASSESSMENT / PLAN:  HEMATOLOGIC A:   Saddle PE - Evidence of right heart strain on CTA. Leukocytosis - Likely stress response with demargination.  P:  Trending Cell Counts daily w/ CBC Consulting Hematology in AM Continuing Heparin gtt per pharmacy protocol Consulting Interventional Radiology for possible catheter directed TPA Repeat Troponin I & BNP now Stat Type & Screen   PULMONARY A: Saddle PE Acute Hypoxic Respiratory Failure  P:   Continuous pulse ox Wean FiO2 to maintain Sat >94%  CARDIOVASCULAR A:  Hypotension - Resolved with IVF. Likely secondary to right heart strain from PE.  P:  Telemetry Monitoring Vitals per Unit Protocol  RENAL A:   Acute Renal Failure - Likely due to hypotension.  P:   Trending UOP Monitoring electrolytes & renal function daily Replacing electrolytes as indicated  GASTROINTESTINAL A:   Transaminitis  P:   NPO Zofran IV prn Trending LFTs daily  INFECTIOUS A:   No acute issues.  P:   Monitor for fever  ENDOCRINE A:   Hyperglycemia    P:   Checking Hgb A1c Accu-Checks q4hr SSI per low dose algorithm  NEUROLOGIC A:   Syncope - Presumably secondary to hypotension from acute PE.  P:   Close monitoring in ICU Holding home pain medications for now  MUSCULOSKELETAL/OTHER A: Nasal Bridge Laceration Mild Comminuted Nasal Bone Fracture  P: Patient refused suturing Monitor closely with systemic anticoagulation/lytic therapy    FAMILY  - Updates: No family at bedside on 6/13.  - Inter-disciplinary family meet or Palliative Care meeting due by:  6/20  I have spent a total of 54 minutes of critical care time today caring for the patient, calling consultation to interventional radiology, and  reviewing the patient's electronic medical record.  Donna Christen Jamison Neighbor, M.D. [facility] Pager:  [phone] After 3pm or if no response, call [phone] 2:45 AM 12/13/2015

## 2015-12-13 NOTE — Progress Notes (Addendum)
ANTICOAGULATION CONSULT NOTE - Initial Consult  Pharmacy Consult for Heparin Indication: pulmonary embolus  Allergies  Allergen Reactions  . Tramadol Anaphylaxis and Rash  . Other Other (See Comments)    Rembrant Whitening strips  "fat lips"    Patient Measurements: Height: 5\' 8"  (172.7 cm) Weight: 218 lb (98.884 kg) IBW/kg (Calculated) : 68.4 Heparin Dosing Weight: 90 kg  Vital Signs: Temp: 98.3 F (36.8 C) (06/12 2258) Temp Source: Oral (06/12 2258) BP: 108/74 mmHg (06/12 2345) Pulse Rate: 140 (06/13 0034)  Labs:  Recent Labs  12/12/15 2248 12/12/15 2303  HGB 15.8 17.7*  HCT 49.0 52.0  PLT 128*  --   LABPROT 14.8  --   INR 1.14  --   CREATININE 1.43* 1.20    Estimated Creatinine Clearance: 86.8 mL/min (by C-G formula based on Cr of 1.2).   Medical History: Past Medical History  Diagnosis Date  . Chronic neck pain   . Right eye injury   . Hypertension   . History of tachycardia 05/04/2015  . History of head injury 05/04/2015  . DDD (degenerative disc disease), cervical 05/04/2015    Medications:  No current facility-administered medications on file prior to encounter.   Current Outpatient Prescriptions on File Prior to Encounter  Medication Sig Dispense Refill  . gabapentin (NEURONTIN) 800 MG tablet Take 1 tablet (800 mg total) by mouth every 6 (six) hours. 120 tablet 2  . Magnesium Oxide 500 MG CAPS Take 1 capsule (500 mg total) by mouth 2 (two) times daily at 8 am and 10 pm. 100 capsule PRN  . oxyCODONE (OXY IR/ROXICODONE) 5 MG immediate release tablet Take 1 tablet (5 mg total) by mouth 5 (five) times daily as needed for severe pain. 150 tablet 0  . oxyCODONE (OXY IR/ROXICODONE) 5 MG immediate release tablet Take 1 tablet (5 mg total) by mouth 5 (five) times daily as needed for severe pain. 150 tablet 0  . oxyCODONE (OXY IR/ROXICODONE) 5 MG immediate release tablet Take 1 tablet (5 mg total) by mouth 5 (five) times daily as needed for severe pain. 150  tablet 0  . propranolol (INDERAL) 20 MG tablet Take 20 mg by mouth 3 (three) times daily.    . tizanidine (ZANAFLEX) 2 MG capsule Take 1 capsule (2 mg total) by mouth 3 (three) times daily as needed for muscle spasms. 90 capsule 2  . Wheat Dextrin (BENEFIBER) POWD Stir 2 tsp. TID into 4-8 oz of any non-carbonated beverage or soft food (hot or cold) 500 g PRN     Assessment: 45 y.o. male with SOB/chest pain, found to have PE on Chest CT, for heparin  Goal of Therapy:  Heparin level 0.3-0.7 units/ml Monitor platelets by anticoagulation protocol: Yes   Plan:  Heparin 4000 units IV bolus, then start heparin 1500 units/hr Check heparin level in 6 hours.   Khalil Szczepanik, Gary Fleet 12/13/2015,12:57 AM

## 2015-12-13 NOTE — Progress Notes (Signed)
VASCULAR LAB PRELIMINARY  PRELIMINARY  PRELIMINARY  PRELIMINARY  Bilateral lower extremity venous duplex completed.    Bilateral:  No evidence of DVT, superficial thrombosis, or Baker's Cyst. Sluggish blood flow bilaterally.  Oluwafemi Villella, RVT, RDMS 12/13/2015, 9:55 AM

## 2015-12-13 NOTE — Progress Notes (Signed)
ANTICOAGULATION CONSULT NOTE - Follow Up Consult  Pharmacy Consult for Heparin Indication: pulmonary embolus  Allergies  Allergen Reactions  . Tramadol Anaphylaxis and Rash  . Other Other (See Comments)    Rembrant Whitening strips  "fat lips"    Patient Measurements: Height: 5\' 8"  (172.7 cm) Weight: 213 lb 3 oz (96.7 kg) IBW/kg (Calculated) : 68.4 Heparin Dosing Weight: 90kg  Vital Signs: Temp: 98.3 F (36.8 C) (06/13 1246) Temp Source: Oral (06/13 1246) BP: 111/77 mmHg (06/13 1600) Pulse Rate: 127 (06/13 1600)  Labs:  Recent Labs  12/12/15 2248 12/12/15 2303 12/13/15 0234 12/13/15 0855 12/13/15 1453  HGB 15.8 17.7* 15.3  --   --   HCT 49.0 52.0 46.9  --   --   PLT 128*  --  129*  --   --   LABPROT 14.8  --   --   --   --   INR 1.14  --   --   --   --   HEPARINUNFRC  --   --   --  0.33 0.38  CREATININE 1.43* 1.20 1.11  --   --   TROPONINI  --   --  0.38* 0.48* 0.36*    Estimated Creatinine Clearance: 92.7 mL/min (by C-G formula based on Cr of 1.11).   Medications:  Heparin @ 1600 units/hr  Assessment: 67 YOM who presented to the [facility] on 6/12 s/p a syncopal event - the patient had been noted to have increasing SOB x several days.  Per EMS, the patient was in Afib with RVR. A CT on arrival showed a large bilateral saddle PEs with right heart strain. LE dopplers negative for DVT. Pharmacy has been consulted to start heparin for anticoagulation.  Heparin level this afternoon remains therapeutic (HL 0.38 << 0.33, goal of 0.3-0.7). CBC stable - no overt s/sx of bleeding noted.   IR recommending lysis initially but since stable - holding catheter directed thrombolysis at this point.   Goal of Therapy:  Heparin level 0.3-0.7 units/ml Monitor platelets by anticoagulation protocol: Yes   Plan:  1. Continue heparin at 1600 units/hr (16 ml/hr) 2. Will continue to monitor for any signs/symptoms of bleeding and will follow up with heparin level in the a.m.    Georgina Pillion, PharmD, BCPS Clinical Pharmacist Pager: [phone] 12/13/2015 4:13 PM

## 2015-12-13 NOTE — Progress Notes (Signed)
ANTICOAGULATION CONSULT NOTE - Follow Up Consult  Pharmacy Consult for Heparin Indication: pulmonary embolus  Allergies  Allergen Reactions  . Tramadol Anaphylaxis and Rash  . Other Other (See Comments)    Rembrant Whitening strips  "fat lips"    Patient Measurements: Height: 5\' 8"  (172.7 cm) Weight: 213 lb 3 oz (96.7 kg) IBW/kg (Calculated) : 68.4 Heparin Dosing Weight: 90kg  Vital Signs: Temp: 98.6 F (37 C) (06/13 0926) Temp Source: Oral (06/13 0926) BP: 137/94 mmHg (06/13 0800) Pulse Rate: 132 (06/13 0800)  Labs:  Recent Labs  12/12/15 2248 12/12/15 2303 12/13/15 0234 12/13/15 0855  HGB 15.8 17.7* 15.3  --   HCT 49.0 52.0 46.9  --   PLT 128*  --  129*  --   LABPROT 14.8  --   --   --   INR 1.14  --   --   --   HEPARINUNFRC  --   --   --  0.33  CREATININE 1.43* 1.20 1.11  --   TROPONINI  --   --  0.38* 0.48*    Estimated Creatinine Clearance: 92.7 mL/min (by C-G formula based on Cr of 1.11).   Medications:  Heparin @ 1500 units/hr  Assessment: 45yom started on IV heparin for large bilateral saddle PEs with right heart strain. LE dopplers negative for DVT. Initial heparin level is therapeutic at 0.33. CBC stable. No bleeding. IR recommending lysis but patient unsure if he wants it at this point.  Goal of Therapy:  Heparin level 0.3-0.7 units/ml Monitor platelets by anticoagulation protocol: Yes   Plan:  1) Increase heparin to 1600 units/hr to keep therapeutic 2) Check another heparin level in 6 hours  Fredrik Rigger 12/13/2015,10:25 AM

## 2015-12-13 NOTE — Care Management Important Message (Signed)
Important Message  Patient Details  Name: WULFRIC FLASHER MRN: [id number] Date of Birth: 01/30/1971   Medicare Important Message Given:  Yes    Bernadette Hoit 12/13/2015, 8:39 AM

## 2015-12-13 NOTE — Progress Notes (Signed)
  Echocardiogram 2D Echocardiogram has been performed.  Arvil Chaco 12/13/2015, 3:20 PM

## 2015-12-13 NOTE — Consult Note (Signed)
Chief Complaint: Patient was seen in consultation today for No chief complaint on file.  at the request of * No referring provider recorded for this case *  Referring Physician(s): * No referring provider recorded for this case *  Supervising Physician: Jolaine Click  Patient Status: In-pt / Out-pt  History of Present Illness: Jared Bass is a 45 y.o. male who presented with a 4 day history of chest pain and shortness of breath, and syncope today. Initially, he was in atrial fibrillation with hypotension, then was subsequently found to have large burden saddle PE, with right heart strain (RV/LV 2.1) and elevated troponin (0.38). Currently, he is resting in bed, and denies symptoms.  Past Medical History  Diagnosis Date  . Chronic neck pain   . Right eye injury   . Hypertension   . History of tachycardia 05/04/2015  . History of head injury 05/04/2015  . DDD (degenerative disc disease), cervical 05/04/2015  . History of atrial fibrillation     Past Surgical History  Procedure Laterality Date  . Neck fusion      c5-7  . Tonsillectomy      Allergies: Tramadol and Other  Medications: Prior to Admission medications   Medication Sig Start Date End Date Taking? Authorizing Provider  gabapentin (NEURONTIN) 800 MG tablet Take 1 tablet (800 mg total) by mouth every 6 (six) hours. 11/30/15   Delano Metz, MD  Magnesium Oxide 500 MG CAPS Take 1 capsule (500 mg total) by mouth 2 (two) times daily at 8 am and 10 pm. 11/30/15   Delano Metz, MD  oxyCODONE (OXY IR/ROXICODONE) 5 MG immediate release tablet Take 1 tablet (5 mg total) by mouth 5 (five) times daily as needed for severe pain. 11/30/15   Delano Metz, MD  oxyCODONE (OXY IR/ROXICODONE) 5 MG immediate release tablet Take 1 tablet (5 mg total) by mouth 5 (five) times daily as needed for severe pain. 11/30/15   Delano Metz, MD  oxyCODONE (OXY IR/ROXICODONE) 5 MG immediate release tablet Take 1 tablet (5 mg  total) by mouth 5 (five) times daily as needed for severe pain. 11/30/15   Delano Metz, MD  propranolol (INDERAL) 20 MG tablet Take 20 mg by mouth 3 (three) times daily.    Historical Provider, MD  tizanidine (ZANAFLEX) 2 MG capsule Take 1 capsule (2 mg total) by mouth 3 (three) times daily as needed for muscle spasms. 11/30/15   Delano Metz, MD  Wheat Dextrin (BENEFIBER) POWD Stir 2 tsp. TID into 4-8 oz of any non-carbonated beverage or soft food (hot or cold) 11/30/15   Delano Metz, MD     Family History  Problem Relation Age of Onset  . Hypertension Mother   . COPD Mother   . Mesothelioma Father   . COPD Father   . Peripheral vascular disease Brother     Clots in his arteries surgically removed    Social History   Social History  . Marital Status: Single    Spouse Name: N/A  . Number of Children: N/A  . Years of Education: N/A   Social History Main Topics  . Smoking status: Never Smoker   . Smokeless tobacco: None  . Alcohol Use: No  . Drug Use: No  . Sexual Activity: Not Asked   Other Topics Concern  . 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     Review of Systems: A 12 point ROS discussed  and pertinent positives are indicated in the HPI above.  All other systems are negative.  Review of Systems  Vital Signs: BP 117/95 mmHg  Pulse 132  Temp(Src) 98.3 F (36.8 C) (Oral)  Resp 23  Ht 5\' 8"  (1.727 m)  Wt 218 lb (98.884 kg)  BMI 33.15 kg/m2  SpO2 95%  Physical Exam  Constitutional: He is oriented to person, place, and time. He appears well-developed and well-nourished.  HENT:  Nasal laceration  Cardiovascular:  Tachycardic and irregular  Pulmonary/Chest: Effort normal and breath sounds normal.  Abdominal: Soft. Bowel sounds are normal.  Musculoskeletal: Normal range of motion.  Neurological: He is alert and oriented to person, place, and time.    Mallampati Score:      Imaging: Ct Head Wo Contrast  12/13/2015  CLINICAL DATA:  Acute onset of syncope and fall onto face. Concern for head or cervical spine injury. Initial encounter. EXAM: CT HEAD WITHOUT CONTRAST CT CERVICAL SPINE WITHOUT CONTRAST TECHNIQUE: Multidetector CT imaging of the head and cervical spine was performed following the standard protocol without intravenous contrast. Multiplanar CT image reconstructions of the cervical spine were also generated. COMPARISON:  CT of the cervical spine performed 08/27/2014, MRI of the cervical spine performed 10/09/2014, and CT of the head performed 05/12/2014 FINDINGS: CT HEAD FINDINGS There is no evidence of acute infarction, mass lesion, or intra- or extra-axial hemorrhage on CT. The posterior fossa, including the cerebellum, brainstem and fourth ventricle, is within normal limits. The third and lateral ventricles, and basal ganglia are unremarkable in appearance. The cerebral hemispheres are symmetric in appearance, with normal gray-white differentiation. No mass effect or midline shift is seen. There is a mildly comminuted fracture involving the nasal bone. The orbits are within normal limits. There is partial opacification of the sphenoid sinus. The remaining paranasal sinuses and mastoid air cells are well-aerated. No significant soft tissue abnormalities are seen. CT CERVICAL SPINE FINDINGS There is no evidence of fracture or subluxation. Anterior cervical spinal fusion hardware is noted at C5-C7, with associated posterior osteophyte at C5-C6. Vertebral bodies demonstrate normal height and alignment. Intervertebral disc spaces are otherwise preserved. Prevertebral soft tissues are within normal limits. The thyroid gland is unremarkable in appearance. The visualized lung apices are clear. No significant soft tissue abnormalities are seen. IMPRESSION: 1. No evidence of traumatic intracranial injury. 2. Mildly comminuted fracture involving the nasal bone. 3. No evidence of  fracture or subluxation along the cervical spine. 4. Partial opacification of the sphenoid sinus. 5. Anterior cervical spinal fusion at C5-C7, with underlying posterior osteophyte at C5-C6. Electronically Signed   By: Roanna Raider M.D.   On: 12/13/2015 01:39   Ct Angio Chest Pe W/cm &/or Wo Cm  12/13/2015  CLINICAL DATA:  Acute onset of syncope and fall. Concern for pulmonary embolus. Initial encounter. EXAM: CT ANGIOGRAPHY CHEST WITH CONTRAST TECHNIQUE: Multidetector CT imaging of the chest was performed using the standard protocol during bolus administration of intravenous contrast. Multiplanar CT image reconstructions and MIPs were obtained to evaluate the vascular anatomy. CONTRAST:  100 mL of Isovue 370 IV contrast COMPARISON:  Chest radiograph performed 12/12/2015 FINDINGS: Large saddle pulmonary embolus is noted, extending into all lobes of both lungs, with apparent occlusion of many segmental branches. There is an RV/LV ratio of 2.1, compatible with significant right heart strain. Mild pulmonary infarct is noted at the right lung base. Mild patchy bilateral atelectasis is seen. Mild left-sided peribronchial thickening is seen. There is no evidence of pleural effusion or  pneumothorax. No masses are identified; no abnormal focal contrast enhancement is seen. The mediastinum is otherwise unremarkable in appearance. No mediastinal lymphadenopathy is seen. No pericardial effusion is identified. The great vessels are grossly unremarkable in appearance. No axillary lymphadenopathy is seen. The thyroid gland is unremarkable in appearance. The visualized portions of the liver and spleen are unremarkable. The visualized portions of the pancreas, gallbladder, stomach, adrenal glands and kidneys are within normal limits. No acute osseous abnormalities are seen. Anterior cervical spinal fusion hardware is noted. Review of the MIP images confirms the above findings. IMPRESSION: 1. Large saddle pulmonary embolus  noted, extending into all lobes of both lungs, with apparent occlusion of many segmental branches. CT evidence of significant right heart strain (RV/LV Ratio = 2.1) consistent with at least submassive (intermediate risk) PE. The presence of right heart strain has been associated with an increased risk of morbidity and mortality. Please activate Code PE by paging [phone]. 2. Mild pulmonary infarct at the right lung base. Mild patchy bilateral atelectasis seen. Mild left-sided peribronchial thickening noted. Critical Value/emergent results were called by telephone at the time of interpretation on 12/13/2015 at 1:31 am to Dr. Tilden Fossa, who verbally acknowledged these results. Electronically Signed   By: Roanna Raider M.D.   On: 12/13/2015 01:33   Ct Cervical Spine Wo Contrast  12/13/2015  CLINICAL DATA:  Acute onset of syncope and fall onto face. Concern for head or cervical spine injury. Initial encounter. EXAM: CT HEAD WITHOUT CONTRAST CT CERVICAL SPINE WITHOUT CONTRAST TECHNIQUE: Multidetector CT imaging of the head and cervical spine was performed following the standard protocol without intravenous contrast. Multiplanar CT image reconstructions of the cervical spine were also generated. COMPARISON:  CT of the cervical spine performed 08/27/2014, MRI of the cervical spine performed 10/09/2014, and CT of the head performed 05/12/2014 FINDINGS: CT HEAD FINDINGS There is no evidence of acute infarction, mass lesion, or intra- or extra-axial hemorrhage on CT. The posterior fossa, including the cerebellum, brainstem and fourth ventricle, is within normal limits. The third and lateral ventricles, and basal ganglia are unremarkable in appearance. The cerebral hemispheres are symmetric in appearance, with normal gray-white differentiation. No mass effect or midline shift is seen. There is a mildly comminuted fracture involving the nasal bone. The orbits are within normal limits. There is partial opacification  of the sphenoid sinus. The remaining paranasal sinuses and mastoid air cells are well-aerated. No significant soft tissue abnormalities are seen. CT CERVICAL SPINE FINDINGS There is no evidence of fracture or subluxation. Anterior cervical spinal fusion hardware is noted at C5-C7, with associated posterior osteophyte at C5-C6. Vertebral bodies demonstrate normal height and alignment. Intervertebral disc spaces are otherwise preserved. Prevertebral soft tissues are within normal limits. The thyroid gland is unremarkable in appearance. The visualized lung apices are clear. No significant soft tissue abnormalities are seen. IMPRESSION: 1. No evidence of traumatic intracranial injury. 2. Mildly comminuted fracture involving the nasal bone. 3. No evidence of fracture or subluxation along the cervical spine. 4. Partial opacification of the sphenoid sinus. 5. Anterior cervical spinal fusion at C5-C7, with underlying posterior osteophyte at C5-C6. Electronically Signed   By: Roanna Raider M.D.   On: 12/13/2015 01:39   Dg Chest Port 1 View  12/12/2015  CLINICAL DATA:  Acute onset of syncope. Head injury. Atrial fibrillation. Initial encounter. EXAM: PORTABLE CHEST 1 VIEW COMPARISON:  Chest radiograph performed 10/14/2013 FINDINGS: The lungs are well-aerated. Pulmonary vascularity is at the upper limits of normal. There is no  evidence of focal opacification, pleural effusion or pneumothorax. The cardiomediastinal silhouette is mildly enlarged. No acute osseous abnormalities are seen. External pacing pads are noted. Cervical spinal fusion hardware is partially imaged. IMPRESSION: Mild cardiomegaly.  Lungs remain grossly clear. Electronically Signed   By: Roanna Raider M.D.   On: 12/12/2015 23:11    Labs:  CBC:  Recent Labs  12/12/15 2248 12/12/15 2303 12/13/15 0234  WBC 12.7*  --  10.5  HGB 15.8 17.7* 15.3  HCT 49.0 52.0 46.9  PLT 128*  --  129*    COAGS:  Recent Labs  12/12/15 2248  INR 1.14     BMP:  Recent Labs  11/30/15 1212 12/12/15 2248 12/12/15 2303 12/13/15 0234  NA 138 135 138 135  K 4.0 3.9 4.4 3.8  CL 103 102 101 106  CO2 29 22  --  21*  GLUCOSE 97 150* 147* 119*  BUN 11 12 18 11   CALCIUM 9.1 8.2*  --  8.2*  CREATININE 0.95 1.43* 1.20 1.11  GFRNONAA >60 57*  --  >60  GFRAA >60 >60  --  >60    LIVER FUNCTION TESTS:  Recent Labs  11/30/15 1212 12/12/15 2248 12/13/15 0234  BILITOT 0.5 2.1* 1.4*  AST 28 126* 114*  ALT 32 61 60  ALKPHOS 74 84 79  PROT 7.4 6.6 6.4*  ALBUMIN 4.1 3.2* 3.1*     Assessment and Plan:  45 YO with large thrombus burden and submassive PE. He already exhibited arrhythmia and syncope. He is very tachycardic and mostly like has very elevated RH pressures. He would benefit from PE lysis, with a small risk associated with the nasal bone fracture. He wishes to hold off at this time and discuss options with his family later in the AM. We are available to proceed as needed.  Thank you for this interesting consult.  I greatly enjoyed meeting Jared Bass and look forward to participating in their care.  A copy of this report was sent to the requesting provider on this date.  Electronically Signed: Taejah Ohalloran, ART A 12/13/2015, 4:06 AM   I spent a total of 40 Minutes  in face to face in clinical consultation, greater than 50% of which was counseling/coordinating care for PE lysis.

## 2015-12-14 DIAGNOSIS — S022XXS Fracture of nasal bones, sequela: Secondary | ICD-10-CM

## 2015-12-14 DIAGNOSIS — J9601 Acute respiratory failure with hypoxia: Secondary | ICD-10-CM | POA: Diagnosis present

## 2015-12-14 DIAGNOSIS — I272 Other secondary pulmonary hypertension: Secondary | ICD-10-CM

## 2015-12-14 DIAGNOSIS — N179 Acute kidney failure, unspecified: Secondary | ICD-10-CM | POA: Diagnosis present

## 2015-12-14 DIAGNOSIS — R55 Syncope and collapse: Secondary | ICD-10-CM | POA: Diagnosis present

## 2015-12-14 DIAGNOSIS — S022XXA Fracture of nasal bones, initial encounter for closed fracture: Secondary | ICD-10-CM | POA: Diagnosis present

## 2015-12-14 LAB — ECHOCARDIOGRAM COMPLETE
FS: 34 % (ref 28–44)
HEIGHTINCHES: 68 in
IVS/LV PW RATIO, ED: 1.02
LA ID, A-P, ES: 30 mm
LA diam index: 1.43 cm/m2
LA vol A4C: 35.4 ml
LDCA: 3.8 cm2
LEFT ATRIUM END SYS DIAM: 30 mm
LVOT diameter: 22 mm
PW: 13.7 mm — AB (ref 0.6–1.1)
TAPSE: 24.2 mm
Weight: 3410.96 oz

## 2015-12-14 LAB — CBC
HCT: 44.9 % (ref 39.0–52.0)
Hemoglobin: 14.3 g/dL (ref 13.0–17.0)
MCH: 29.7 pg (ref 26.0–34.0)
MCHC: 31.8 g/dL (ref 30.0–36.0)
MCV: 93.2 fL (ref 78.0–100.0)
PLATELETS: 133 10*3/uL — AB (ref 150–400)
RBC: 4.82 MIL/uL (ref 4.22–5.81)
RDW: 13.3 % (ref 11.5–15.5)
WBC: 10.3 10*3/uL (ref 4.0–10.5)

## 2015-12-14 LAB — GLUCOSE, CAPILLARY
GLUCOSE-CAPILLARY: 110 mg/dL — AB (ref 65–99)
GLUCOSE-CAPILLARY: 108 mg/dL — AB (ref 65–99)
Glucose-Capillary: 119 mg/dL — ABNORMAL HIGH (ref 65–99)
Glucose-Capillary: 125 mg/dL — ABNORMAL HIGH (ref 65–99)
Glucose-Capillary: 132 mg/dL — ABNORMAL HIGH (ref 65–99)
Glucose-Capillary: 96 mg/dL (ref 65–99)

## 2015-12-14 LAB — HEMOGLOBIN A1C
HEMOGLOBIN A1C: 5.4 % (ref 4.8–5.6)
Mean Plasma Glucose: 108 mg/dL

## 2015-12-14 LAB — BASIC METABOLIC PANEL
Anion gap: 7 (ref 5–15)
BUN: 8 mg/dL (ref 6–20)
CALCIUM: 8.3 mg/dL — AB (ref 8.9–10.3)
CO2: 25 mmol/L (ref 22–32)
CREATININE: 0.93 mg/dL (ref 0.61–1.24)
Chloride: 102 mmol/L (ref 101–111)
Glucose, Bld: 106 mg/dL — ABNORMAL HIGH (ref 65–99)
Potassium: 3.6 mmol/L (ref 3.5–5.1)
SODIUM: 134 mmol/L — AB (ref 135–145)

## 2015-12-14 LAB — HEPARIN LEVEL (UNFRACTIONATED): Heparin Unfractionated: 0.27 IU/mL — ABNORMAL LOW (ref 0.30–0.70)

## 2015-12-14 MED ORDER — OXYCODONE HCL 5 MG PO TABS
5.0000 mg | ORAL_TABLET | Freq: Four times a day (QID) | ORAL | Status: DC | PRN
  Administered 2015-12-15 – 2015-12-18 (×10): 5 mg via ORAL
  Filled 2015-12-14 (×12): qty 1

## 2015-12-14 NOTE — Progress Notes (Signed)
ANTICOAGULATION CONSULT NOTE   Pharmacy Consult for Heparin Indication: pulmonary embolus  Allergies  Allergen Reactions  . Tramadol Anaphylaxis and Rash  . Other Other (See Comments)    Rembrant Whitening strips  "fat lips"    Patient Measurements: Height: 5\' 8"  (172.7 cm) Weight: 213 lb 3 oz (96.7 kg) IBW/kg (Calculated) : 68.4 Heparin Dosing Weight: 90 kg  Vital Signs: Temp: 99.7 F (37.6 C) (06/14 0400) Temp Source: Oral (06/14 0400) BP: 103/84 mmHg (06/14 0300) Pulse Rate: 123 (06/14 0300)  Labs:  Recent Labs  12/12/15 2248 12/12/15 2303 12/13/15 0234 12/13/15 0855 12/13/15 1453 12/14/15 0355  HGB 15.8 17.7* 15.3  --   --  14.3  HCT 49.0 52.0 46.9  --   --  44.9  PLT 128*  --  129*  --   --  133*  LABPROT 14.8  --   --   --   --   --   INR 1.14  --   --   --   --   --   HEPARINUNFRC  --   --   --  0.33 0.38 0.27*  CREATININE 1.43* 1.20 1.11  --   --   --   TROPONINI  --   --  0.38* 0.48* 0.36*  --     Estimated Creatinine Clearance: 92.7 mL/min (by C-G formula based on Cr of 1.11).  Assessment: 45 y.o. male with saddle PE  for heparin  Goal of Therapy:  Heparin level 0.3-0.7 units/ml Monitor platelets by anticoagulation protocol: Yes   Plan:  Increase Heparin 1750 units/hr   Eddie Candle 12/14/2015,4:29 AM

## 2015-12-14 NOTE — Progress Notes (Signed)
PROGRESS NOTE    Jared Bass  GEX:[id number] DOB: [birth year] DOA: 12/12/2015 PCP: Sherrie Mustache, MD   Brief Narrative:  45 y.o. WM PMHx Chronic neck pain, C-spine DDD, S/P neck fusion C5-C7, HTN, A. fib,  Presents to the Emergency Department complaining of SOB. Patient presents via EMS for evaluation of shortness of breath has been ongoing for the last 4 days. He has a history of atrial fibrillation but has not been taking his propanolol for this for the last month. He states he has been trying to control his symptoms with his breathing. Over the last 4 days he has had increased shortness of breath and cough with associated chest pain. He had a syncopal event today and hit his head. His sister called EMS because he was too sick to call. Per EMS he was in a atrial fibrillation with RVR. They gave him adenosine 6 mg, 12 mg, 12 mg with no change in his rhythm. They did have intermittent episodes of hypotension into the 70s. He received 1 L normal saline prior to ED arrival.   Assessment & Plan:   Active Problems:   Saddle pulmonary embolus (HCC)   Acute respiratory failure with hypoxia (HCC)   Pulmonary hypertension (HCC)   Acute renal failure (HCC)   Closed fracture of nasal bone   Faintness  Saddle PE / Evidence of right heart strain on CTA. -Continue heparin drip -Hypercoagulability panel; since patient has acute clot and was started on heparin will not be able to obtain anti-thrombin 3, protein C/S, lupus anticoagulant. However will obtain remainder of hypercoagulability panel. -Beta-2 glycoprotein, homocysteine, factor V Leiden, prothrombin gene mutation, cardiolipin antibodies pending -Given patient's family history most likely positive for a genetic mutation. Will need to follow-up with Hematology as outpatient  Acute Hypoxic Respiratory Failure -See saddle PE -Continuous pulse ox -Obtain ambulatory SPO2 on 6/15 -Titrate O2 to maintain SPO2 > 93%   Severe pulmonary  Hypertension  -Consistent with a large clot burden -Will discuss long-term treatment options with patient in A.m.  Hypotension  - Resolved with IVF. Likely secondary to right heart strain from PE.  Acute Renal Failure  - Likely due to hypotension. -Resolved  Hyperglycemia -6/13 hemoglobin A1c= 5.4   Syncope  - Presumably secondary to hypotension from acute PE.  Mild Comminuted Nasal Bone Fracture/Nasal Bridge Laceration -Sutures in place stable  Pain management -OxyIR 5 mg QID PRN    DVT prophylaxis: Treatment dose heparin Code Status: Full Family Communication: None available Disposition Plan: Next 24-48 hours   Consultants:    Procedures/Significant Events:  EKG 6/12: Sinus tachycardia. With inverted T waves across precordial leads.  CTA CHEST 6/13: Saddle PE with clot extending into bilateral main pulmonary arteries and segmental arteries on my review. RV/LV ratio 2.1. Mild infarct right lung base. No pleural effusion or thickening. No pathologic mediastinal adenopathy.  CT HEAD/NECK W/O 6/13: No evidence for traumatic intracranial injury. Mild comminuted fracture involving nasal bone. No evidence of fracture or subluxation cervical spine. C5-7 fusion. 6/13 echocardiogram;Left ventricle: mild concentric hypertrophy. -LVEF= 65% to 70%.  - Right ventricle: Moderately dilated.  - Pulmonary arteries:  PA peak pressure: 60 mm Hg (S). 6/13 bilateral lower extremity Doppler; negative DVT/SVT. Sluggish blood flow bilateral   Cultures NA  Antimicrobials: NA   Devices NA   LINES / TUBES:      Continuous Infusions: . heparin 1,750 Units/hr (12/14/15 1400)     Subjective: 6/14 A/O 4, positive acute respiratory failure with hypoxia. States  not on home O2. Negative smoking. States his grandmother, brother, and uncles all have had blood clots. To his knowledge none have been genetically worked up.    Objective: Filed Vitals:   12/14/15 1300 12/14/15  1400 12/14/15 1523 12/14/15 1600  BP: 101/86 117/82 127/96 120/92  Pulse: 125 126 134 125  Temp:   98.8 F (37.1 C)   TempSrc:   Oral   Resp: 20 35 21 26  Height:   5\' 9"  (1.753 m)   Weight:   96.2 kg (212 lb 1.3 oz)   SpO2: 91% 92% 95% 92%    Intake/Output Summary (Last 24 hours) at 12/14/15 1846 Last data filed at 12/14/15 1400  Gross per 24 hour  Intake 1054.2 ml  Output      0 ml  Net 1054.2 ml   Filed Weights   12/13/15 0428 12/14/15 0500 12/14/15 1523  Weight: 96.7 kg (213 lb 3 oz) 97.4 kg (214 lb 11.7 oz) 96.2 kg (212 lb 1.3 oz)    Examination:  General: A/O 4, positive acute respiratory distress Eyes: negative scleral hemorrhage, negative anisocoria, negative icterus ENT: Negative Runny nose, negative gingival bleeding, Neck:  Negative scars, masses, torticollis, lymphadenopathy, JVD Lungs: Clear to auscultation bilaterally without wheezes or crackles Cardiovascular: Regular rate and rhythm without murmur gallop or rub normal S1 and S2 Abdomen: negative abdominal pain, nondistended, positive soft, bowel sounds, no rebound, no ascites, no appreciable mass Extremities: No significant cyanosis, clubbing, or edema bilateral lower extremities Skin: Multiple facial lacerations (secondary LOC)  Psychiatric:  Negative depression, negative anxiety, negative fatigue, negative mania  Central nervous system:  Cranial nerves II through XII intact, tongue/uvula midline, all extremities muscle strength 5/5, sensation intact throughout, negative dysarthria, negative expressive aphasia, negative receptive aphasia.  .     Data Reviewed: Care during the described time interval was provided by me .  I have reviewed this patient's available data, including medical history, events of note, physical examination, and all test results as part of my evaluation. I have personally reviewed and interpreted all radiology studies.  CBC:  Recent Labs Lab 12/12/15 2248 12/12/15 2303  12/13/15 0234 12/14/15 0355  WBC 12.7*  --  10.5 10.3  NEUTROABS 9.8*  --   --   --   HGB 15.8 17.7* 15.3 14.3  HCT 49.0 52.0 46.9 44.9  MCV 93.3  --  91.1 93.2  PLT 128*  --  129* 133*   Basic Metabolic Panel:  Recent Labs Lab 12/12/15 2248 12/12/15 2303 12/13/15 0234 12/14/15 0355  NA 135 138 135 134*  K 3.9 4.4 3.8 3.6  CL 102 101 106 102  CO2 22  --  21* 25  GLUCOSE 150* 147* 119* 106*  BUN 12 18 11 8   CREATININE 1.43* 1.20 1.11 0.93  CALCIUM 8.2*  --  8.2* 8.3*  MG  --   --  1.7  --   PHOS  --   --  2.5  --    GFR: Estimated Creatinine Clearance: 112.4 mL/min (by C-G formula based on Cr of 0.93). Liver Function Tests:  Recent Labs Lab 12/12/15 2248 12/13/15 0234  AST 126* 114*  ALT 61 60  ALKPHOS 84 79  BILITOT 2.1* 1.4*  PROT 6.6 6.4*  ALBUMIN 3.2* 3.1*   No results for input(s): LIPASE, AMYLASE in the last 168 hours. No results for input(s): AMMONIA in the last 168 hours. Coagulation Profile:  Recent Labs Lab 12/12/15 2248  INR 1.14   Cardiac  Enzymes:  Recent Labs Lab 12/13/15 0234 12/13/15 0855 12/13/15 1453  TROPONINI 0.38* 0.48* 0.36*   BNP (last 3 results) No results for input(s): PROBNP in the last 8760 hours. HbA1C:  Recent Labs  12/13/15 0234  HGBA1C 5.4   CBG:  Recent Labs Lab 12/14/15 12/14/15 0412 12/14/15 0824 12/14/15 1214 12/14/15 1605  GLUCAP 125* 132* 110* 108* 96   Lipid Profile: No results for input(s): CHOL, HDL, LDLCALC, TRIG, CHOLHDL, LDLDIRECT in the last 72 hours. Thyroid Function Tests: No results for input(s): TSH, T4TOTAL, FREET4, T3FREE, THYROIDAB in the last 72 hours. Anemia Panel: No results for input(s): VITAMINB12, FOLATE, FERRITIN, TIBC, IRON, RETICCTPCT in the last 72 hours. Urine analysis:    Component Value Date/Time   COLORURINE YELLOW 02/01/2009 1447   APPEARANCEUR CLOUDY* 02/01/2009 1447   LABSPEC 1.030 02/01/2009 1447   PHURINE 8.5* 02/01/2009 1447   GLUCOSEU NEGATIVE 02/01/2009  1447   HGBUR NEGATIVE 02/01/2009 1447   BILIRUBINUR NEGATIVE 02/01/2009 1447   KETONESUR 15* 02/01/2009 1447   PROTEINUR 100* 02/01/2009 1447   UROBILINOGEN 1.0 02/01/2009 1447   NITRITE NEGATIVE 02/01/2009 1447   LEUKOCYTESUR NEGATIVE 02/01/2009 1447   Sepsis Labs: @LABRCNTIP (procalcitonin:4,lacticidven:4)  ) Recent Results (from the past 240 hour(s))  MRSA PCR Screening     Status: None   Collection Time: 12/13/15  4:46 AM  Result Value Ref Range Status   MRSA by PCR NEGATIVE NEGATIVE Final    Comment:        The GeneXpert MRSA Assay (FDA approved for NASAL specimens only), is one component of a comprehensive MRSA colonization surveillance program. It is not intended to diagnose MRSA infection nor to guide or monitor treatment for MRSA infections.          Radiology Studies: Ct Head Wo Contrast  12/13/2015  CLINICAL DATA:  Acute onset of syncope and fall onto face. Concern for head or cervical spine injury. Initial encounter. EXAM: CT HEAD WITHOUT CONTRAST CT CERVICAL SPINE WITHOUT CONTRAST TECHNIQUE: Multidetector CT imaging of the head and cervical spine was performed following the standard protocol without intravenous contrast. Multiplanar CT image reconstructions of the cervical spine were also generated. COMPARISON:  CT of the cervical spine performed 08/27/2014, MRI of the cervical spine performed 10/09/2014, and CT of the head performed 05/12/2014 FINDINGS: CT HEAD FINDINGS There is no evidence of acute infarction, mass lesion, or intra- or extra-axial hemorrhage on CT. The posterior fossa, including the cerebellum, brainstem and fourth ventricle, is within normal limits. The third and lateral ventricles, and basal ganglia are unremarkable in appearance. The cerebral hemispheres are symmetric in appearance, with normal gray-white differentiation. No mass effect or midline shift is seen. There is a mildly comminuted fracture involving the nasal bone. The orbits are within  normal limits. There is partial opacification of the sphenoid sinus. The remaining paranasal sinuses and mastoid air cells are well-aerated. No significant soft tissue abnormalities are seen. CT CERVICAL SPINE FINDINGS There is no evidence of fracture or subluxation. Anterior cervical spinal fusion hardware is noted at C5-C7, with associated posterior osteophyte at C5-C6. Vertebral bodies demonstrate normal height and alignment. Intervertebral disc spaces are otherwise preserved. Prevertebral soft tissues are within normal limits. The thyroid gland is unremarkable in appearance. The visualized lung apices are clear. No significant soft tissue abnormalities are seen. IMPRESSION: 1. No evidence of traumatic intracranial injury. 2. Mildly comminuted fracture involving the nasal bone. 3. No evidence of fracture or subluxation along the cervical spine. 4. Partial opacification of the  sphenoid sinus. 5. Anterior cervical spinal fusion at C5-C7, with underlying posterior osteophyte at C5-C6. Electronically Signed   By: Roanna Raider M.D.   On: 12/13/2015 01:39   Ct Angio Chest Pe W/cm &/or Wo Cm  12/13/2015  CLINICAL DATA:  Acute onset of syncope and fall. Concern for pulmonary embolus. Initial encounter. EXAM: CT ANGIOGRAPHY CHEST WITH CONTRAST TECHNIQUE: Multidetector CT imaging of the chest was performed using the standard protocol during bolus administration of intravenous contrast. Multiplanar CT image reconstructions and MIPs were obtained to evaluate the vascular anatomy. CONTRAST:  100 mL of Isovue 370 IV contrast COMPARISON:  Chest radiograph performed 12/12/2015 FINDINGS: Large saddle pulmonary embolus is noted, extending into all lobes of both lungs, with apparent occlusion of many segmental branches. There is an RV/LV ratio of 2.1, compatible with significant right heart strain. Mild pulmonary infarct is noted at the right lung base. Mild patchy bilateral atelectasis is seen. Mild left-sided peribronchial  thickening is seen. There is no evidence of pleural effusion or pneumothorax. No masses are identified; no abnormal focal contrast enhancement is seen. The mediastinum is otherwise unremarkable in appearance. No mediastinal lymphadenopathy is seen. No pericardial effusion is identified. The great vessels are grossly unremarkable in appearance. No axillary lymphadenopathy is seen. The thyroid gland is unremarkable in appearance. The visualized portions of the liver and spleen are unremarkable. The visualized portions of the pancreas, gallbladder, stomach, adrenal glands and kidneys are within normal limits. No acute osseous abnormalities are seen. Anterior cervical spinal fusion hardware is noted. Review of the MIP images confirms the above findings. IMPRESSION: 1. Large saddle pulmonary embolus noted, extending into all lobes of both lungs, with apparent occlusion of many segmental branches. CT evidence of significant right heart strain (RV/LV Ratio = 2.1) consistent with at least submassive (intermediate risk) PE. The presence of right heart strain has been associated with an increased risk of morbidity and mortality. Please activate Code PE by paging [phone]. 2. Mild pulmonary infarct at the right lung base. Mild patchy bilateral atelectasis seen. Mild left-sided peribronchial thickening noted. Critical Value/emergent results were called by telephone at the time of interpretation on 12/13/2015 at 1:31 am to Dr. Tilden Fossa, who verbally acknowledged these results. Electronically Signed   By: Roanna Raider M.D.   On: 12/13/2015 01:33   Ct Cervical Spine Wo Contrast  12/13/2015  CLINICAL DATA:  Acute onset of syncope and fall onto face. Concern for head or cervical spine injury. Initial encounter. EXAM: CT HEAD WITHOUT CONTRAST CT CERVICAL SPINE WITHOUT CONTRAST TECHNIQUE: Multidetector CT imaging of the head and cervical spine was performed following the standard protocol without intravenous contrast.  Multiplanar CT image reconstructions of the cervical spine were also generated. COMPARISON:  CT of the cervical spine performed 08/27/2014, MRI of the cervical spine performed 10/09/2014, and CT of the head performed 05/12/2014 FINDINGS: CT HEAD FINDINGS There is no evidence of acute infarction, mass lesion, or intra- or extra-axial hemorrhage on CT. The posterior fossa, including the cerebellum, brainstem and fourth ventricle, is within normal limits. The third and lateral ventricles, and basal ganglia are unremarkable in appearance. The cerebral hemispheres are symmetric in appearance, with normal gray-white differentiation. No mass effect or midline shift is seen. There is a mildly comminuted fracture involving the nasal bone. The orbits are within normal limits. There is partial opacification of the sphenoid sinus. The remaining paranasal sinuses and mastoid air cells are well-aerated. No significant soft tissue abnormalities are seen. CT CERVICAL SPINE FINDINGS There  is no evidence of fracture or subluxation. Anterior cervical spinal fusion hardware is noted at C5-C7, with associated posterior osteophyte at C5-C6. Vertebral bodies demonstrate normal height and alignment. Intervertebral disc spaces are otherwise preserved. Prevertebral soft tissues are within normal limits. The thyroid gland is unremarkable in appearance. The visualized lung apices are clear. No significant soft tissue abnormalities are seen. IMPRESSION: 1. No evidence of traumatic intracranial injury. 2. Mildly comminuted fracture involving the nasal bone. 3. No evidence of fracture or subluxation along the cervical spine. 4. Partial opacification of the sphenoid sinus. 5. Anterior cervical spinal fusion at C5-C7, with underlying posterior osteophyte at C5-C6. Electronically Signed   By: Roanna Raider M.D.   On: 12/13/2015 01:39   Dg Chest Port 1 View  12/12/2015  CLINICAL DATA:  Acute onset of syncope. Head injury. Atrial fibrillation.  Initial encounter. EXAM: PORTABLE CHEST 1 VIEW COMPARISON:  Chest radiograph performed 10/14/2013 FINDINGS: The lungs are well-aerated. Pulmonary vascularity is at the upper limits of normal. There is no evidence of focal opacification, pleural effusion or pneumothorax. The cardiomediastinal silhouette is mildly enlarged. No acute osseous abnormalities are seen. External pacing pads are noted. Cervical spinal fusion hardware is partially imaged. IMPRESSION: Mild cardiomegaly.  Lungs remain grossly clear. Electronically Signed   By: Roanna Raider M.D.   On: 12/12/2015 23:11        Scheduled Meds: . gabapentin  800 mg Oral QID  . insulin aspart  0-9 Units Subcutaneous Q4H   Continuous Infusions: . heparin 1,750 Units/hr (12/14/15 1400)     LOS: 1 day    Time spent: 40 minutes    WOODS, Roselind Messier, MD [facility] Pager [phone]   If 7PM-7AM, please contact night-coverage [email or web address] Password [facility] 12/14/2015, 6:46 PM

## 2015-12-15 DIAGNOSIS — R55 Syncope and collapse: Secondary | ICD-10-CM | POA: Diagnosis present

## 2015-12-15 DIAGNOSIS — I959 Hypotension, unspecified: Secondary | ICD-10-CM | POA: Diagnosis present

## 2015-12-15 DIAGNOSIS — I2602 Saddle embolus of pulmonary artery with acute cor pulmonale: Secondary | ICD-10-CM | POA: Diagnosis present

## 2015-12-15 DIAGNOSIS — I9589 Other hypotension: Secondary | ICD-10-CM

## 2015-12-15 DIAGNOSIS — S022XXA Fracture of nasal bones, initial encounter for closed fracture: Secondary | ICD-10-CM

## 2015-12-15 LAB — CBC
HEMATOCRIT: 43.9 % (ref 39.0–52.0)
HEMOGLOBIN: 14 g/dL (ref 13.0–17.0)
MCH: 29.5 pg (ref 26.0–34.0)
MCHC: 31.9 g/dL (ref 30.0–36.0)
MCV: 92.4 fL (ref 78.0–100.0)
Platelets: 142 10*3/uL — ABNORMAL LOW (ref 150–400)
RBC: 4.75 MIL/uL (ref 4.22–5.81)
RDW: 13 % (ref 11.5–15.5)
WBC: 8.1 10*3/uL (ref 4.0–10.5)

## 2015-12-15 LAB — BETA-2-GLYCOPROTEIN I ABS, IGG/M/A
BETA-2-GLYCOPROTEIN I IGA: 9 GPI IgA units (ref 0–25)
Beta-2 Glyco I IgG: <9 GPI IgG units (ref 0–20)

## 2015-12-15 LAB — HOMOCYSTEINE: Homocysteine: 9.9 umol/L (ref 0.0–15.0)

## 2015-12-15 LAB — HEPARIN LEVEL (UNFRACTIONATED): Heparin Unfractionated: 0.25 IU/mL — ABNORMAL LOW (ref 0.30–0.70)

## 2015-12-15 MED ORDER — APIXABAN 5 MG PO TABS
10.0000 mg | ORAL_TABLET | Freq: Two times a day (BID) | ORAL | Status: DC
  Administered 2015-12-15 – 2015-12-18 (×7): 10 mg via ORAL
  Filled 2015-12-15 (×5): qty 2
  Filled 2015-12-15: qty 4
  Filled 2015-12-15 (×2): qty 2

## 2015-12-15 MED ORDER — PHENOL 1.4 % MT LIQD
1.0000 | Freq: Two times a day (BID) | OROMUCOSAL | Status: DC | PRN
  Administered 2015-12-15: 1 via OROMUCOSAL
  Filled 2015-12-15: qty 177

## 2015-12-15 MED ORDER — HYDROCOD POLST-CPM POLST ER 10-8 MG/5ML PO SUER
5.0000 mL | Freq: Once | ORAL | Status: AC
  Administered 2015-12-15: 5 mL via ORAL
  Filled 2015-12-15: qty 5

## 2015-12-15 MED ORDER — APIXABAN 5 MG PO TABS: 5.0000 mg | ORAL_TABLET | Freq: Two times a day (BID) | ORAL | Status: DC

## 2015-12-15 NOTE — Progress Notes (Signed)
SATURATION QUALIFICATIONS: (This note is used to comply with regulatory documentation for home oxygen)  Patient Saturations on Room Air at Rest = 88%  Patient Saturations on Room Air while Ambulating =%  Patient Saturations on 3L = 91%  Please briefly explain why patient needs home oxygen:Patient unable to sustain 02 saturation in the 90's while not on oxygen.  Pt. Began walking on 2L of 02 and quickly dropped to 87% adjusted patient up to 3L and he was able to maintain 02 saturation in the 90's during ambulation.

## 2015-12-15 NOTE — Discharge Instructions (Signed)
Information on my medicine - ELIQUIS (apixaban)  This medication education was reviewed with me or my healthcare representative as part of my discharge preparation.  The pharmacist that spoke with me during my hospital stay was:  Benny Lennert, [facility]  Why was Eliquis prescribed for you? Eliquis was prescribed to treat blood clots that may have been found in the veins of your legs (deep vein thrombosis) or in your lungs (pulmonary embolism) and to reduce the risk of them occurring again.  What do You need to know about Eliquis ? The starting dose is 10 mg (two 5 mg tablets) taken TWICE daily for the FIRST SEVEN (7) DAYS, then on 12/22/15 the dose is reduced to ONE 5 mg tablet taken TWICE daily.  Eliquis may be taken with or without food.   Try to take the dose about the same time in the morning and in the evening. If you have difficulty swallowing the tablet whole please discuss with your pharmacist how to take the medication safely.  Take Eliquis exactly as prescribed and DO NOT stop taking Eliquis without talking to the doctor who prescribed the medication.  Stopping may increase your risk of developing a new blood clot.  Refill your prescription before you run out.  After discharge, you should have regular check-up appointments with your healthcare provider that is prescribing your Eliquis.    What do you do if you miss a dose? If a dose of ELIQUIS is not taken at the scheduled time, take it as soon as possible on the same day and twice-daily administration should be resumed. The dose should not be doubled to make up for a missed dose.  Important Safety Information A possible side effect of Eliquis is bleeding. You should call your healthcare provider right away if you experience any of the following: ? Bleeding from an injury or your nose that does not stop. ? Unusual colored urine (red or dark brown) or unusual colored stools (red or black). ? Unusual bruising for unknown  reasons. ? A serious fall or if you hit your head (even if there is no bleeding).  Some medicines may interact with Eliquis and might increase your risk of bleeding or clotting while on Eliquis. To help avoid this, consult your healthcare provider or pharmacist prior to using any new prescription or non-prescription medications, including herbals, vitamins, non-steroidal anti-inflammatory drugs (NSAIDs) and supplements.  This website has more information on Eliquis (apixaban): http://www.eliquis.com/eliquis/home

## 2015-12-15 NOTE — Care Management Note (Signed)
Case Management Note  Patient Details  Name: Jared Bass MRN: [id number] Date of Birth: September 18, 1970  Subjective/Objective:  pulmonary embolus                  Action/Plan: Discharge Planning:  NCM spoke to pt and provided him with Eliquis 30 day free trial card. Eliquis is a preferred drug for Medicaid patients. Contacted [facility] DME rep for oxygen for home. Pt states he will stay with his parents until he is better.   PCP Sherrie Mustache MD  Expected Discharge Date:  12/16/2015               Expected Discharge Plan:  Home/Self Care (from home alone)  In-House Referral:  NA  Discharge planning Services  CM Consult, Medication Assistance  Post Acute Care Choice:  NA Choice offered to:  NA  DME Arranged:  N/A DME Agency:  NA  HH Arranged:  NA HH Agency:  NA  Status of Service:  Completed, signed off  Medicare Important Message Given:  Yes Date Medicare IM Given:    Medicare IM give by:    Date Additional Medicare IM Given:    Additional Medicare Important Message give by:     If discussed at Long Length of Stay Meetings, dates discussed:    Additional Comments:  Elliot Cousin, RN 12/15/2015, 2:50 PM

## 2015-12-15 NOTE — Clinical Social Work Note (Signed)
CSW acknowledges consult for Eliquis voucher. Notified RNCM.  CSW signing off.  Charlynn Court, CSW [phone]

## 2015-12-15 NOTE — Progress Notes (Signed)
ANTICOAGULATION CONSULT NOTE   Pharmacy Consult for Heparin Indication: pulmonary embolus  Allergies  Allergen Reactions  . Tramadol Anaphylaxis and Rash  . Other Other (See Comments)    Rembrant Whitening strips  "fat lips"    Patient Measurements: Height: 5\' 9"  (175.3 cm) Weight: 212 lb 1.3 oz (96.2 kg) IBW/kg (Calculated) : 70.7 Heparin Dosing Weight: 90 kg  Vital Signs: Temp: 100.1 F (37.8 C) (06/15 0006) Temp Source: Oral (06/15 0006) BP: 129/97 mmHg (06/14 2200) Pulse Rate: 125 (06/14 2200)  Labs:  Recent Labs  12/12/15 2248 12/12/15 2303 12/13/15 0234  12/13/15 0855 12/13/15 1453 12/14/15 0355 12/15/15 0233  HGB 15.8 17.7* 15.3  --   --   --  14.3 14.0  HCT 49.0 52.0 46.9  --   --   --  44.9 43.9  PLT 128*  --  129*  --   --   --  133* 142*  LABPROT 14.8  --   --   --   --   --   --   --   INR 1.14  --   --   --   --   --   --   --   HEPARINUNFRC  --   --   --   < > 0.33 0.38 0.27* 0.25*  CREATININE 1.43* 1.20 1.11  --   --   --  0.93  --   TROPONINI  --   --  0.38*  --  0.48* 0.36*  --   --   < > = values in this interval not displayed.  Estimated Creatinine Clearance: 112.4 mL/min (by C-G formula based on Cr of 0.93).  Assessment: 45 y.o. male on heparin for saddle PE. Heparin level down to subtherapeutic on 1750 units/hr. CBC stable. No issues with line or bleeding reported per RN.  Goal of Therapy:  Heparin level 0.3-0.7 units/ml Monitor platelets by anticoagulation protocol: Yes   Plan:  Increase Heparin to 1950 units/hr F/u 6 hr heparin level  Christoper Fabian, PharmD, BCPS Clinical pharmacist, pager [phone] 12/15/2015,3:59 AM

## 2015-12-15 NOTE — Progress Notes (Signed)
PROGRESS NOTE    Jared Bass  ZOX:[id number] DOB: 16-Jun-1971 DOA: 12/12/2015 PCP: Sherrie Mustache, MD   Brief Narrative:  45 y.o. WM PMHx Chronic neck pain, C-spine DDD, S/P neck fusion C5-C7, HTN, A. fib,  Presents to the Emergency Department complaining of SOB. Patient presents via EMS for evaluation of shortness of breath has been ongoing for the last 4 days. He has a history of atrial fibrillation but has not been taking his propanolol for this for the last month. He states he has been trying to control his symptoms with his breathing. Over the last 4 days he has had increased shortness of breath and cough with associated chest pain. He had a syncopal event today and hit his head. His sister called EMS because he was too sick to call. Per EMS he was in a atrial fibrillation with RVR. They gave him adenosine 6 mg, 12 mg, 12 mg with no change in his rhythm. They did have intermittent episodes of hypotension into the 70s. He received 1 L normal saline prior to ED arrival.   Assessment & Plan:   Active Problems:   Saddle pulmonary embolus (HCC)   Acute respiratory failure with hypoxia (HCC)   Pulmonary hypertension (HCC)   Acute renal failure (HCC)   Closed fracture of nasal bone   Faintness   Acute saddle pulmonary embolism with acute cor pulmonale (HCC)   Hypotension   Syncope and collapse  Saddle PE / Evidence of right heart strain on CTA. -Continue heparin drip -Hypercoagulability panel; since patient has acute clot and was started on heparin will not be able to obtain anti-thrombin 3, protein C/S, lupus anticoagulant. However will obtain remainder of hypercoagulability panel. -Beta-2 glycoprotein, homocysteine, factor V Leiden, prothrombin gene mutation, cardiolipin antibodies pending -Given patient's family history most likely positive for a genetic mutation. Will need to follow-up with Hematology as outpatient -Start Eliquis per pharmacy -Consulted social work for [employer]  Acute Hypoxic Respiratory Failure -See saddle PE -Continuous pulse ox -Obtain ambulatory SPO2 on 6/15 -Titrate O2 to maintain SPO2 > 93%  -SATURATION QUALIFICATIONS: (This note is used to comply with regulatory documentation for home oxygen) Patient Saturations on Room Air at Rest = 88% Patient Saturations on Room Air while Ambulating =% Patient Saturations on 3L = 91% Please briefly explain why patient needs home oxygen:Patient unable to sustain 02 saturation in the 90's while not on oxygen. Pt. Began walking on 2L of 02 and quickly dropped to 87% adjusted patient up to 3L and he was able to maintain 02 saturation in the 90's during ambulation.  -Patient will require home O2 2 L when sedentary, and increased to 3 L during exertion via [place] 24 hours per day i/o maintain SPO2> 93% -Have requested home O2 equipment to include O2 concentrator  Severe pulmonary Hypertension  -Consistent with a large clot burden -6/15 start Eliquis   Hypotension  - Resolved with IVF. Likely secondary to right heart strain from PE.  Acute Renal Failure  - Likely due to hypotension. -Resolved  Hyperglycemia -6/13 hemoglobin A1c= 5.4   Syncope and collapse - Presumably secondary to hypotension from acute PE.  Mild Comminuted Nasal Bone Fracture/Nasal Bridge Laceration -Sutures in place stable  Pain management -OxyIR 5 mg QID PRN    DVT prophylaxis: Treatment dose heparin Code Status: Full Family Communication: None available Disposition Plan: Next 24-48 hours   Consultants:    Procedures/Significant Events:  EKG 6/12: Sinus tachycardia. With inverted T waves across precordial  leads.  CTA CHEST 6/13: Saddle PE with clot extending into bilateral main pulmonary arteries and segmental arteries on my review. RV/LV ratio 2.1. Mild infarct right lung base. No pleural effusion or thickening. No pathologic mediastinal adenopathy.  CT HEAD/NECK W/O 6/13: No evidence for traumatic  intracranial injury. Mild comminuted fracture involving nasal bone. No evidence of fracture or subluxation cervical spine. C5-7 fusion. 6/13 echocardiogram;Left ventricle: mild concentric hypertrophy. -LVEF= 65% to 70%.  - Right ventricle: Moderately dilated.  - Pulmonary arteries:  PA peak pressure: 60 mm Hg (S). 6/13 bilateral lower extremity Doppler; negative DVT/SVT. Sluggish blood flow bilateral   Cultures NA  Antimicrobials: NA   Devices NA   LINES / TUBES:      Continuous Infusions:     Subjective: 6/15 A/O 4, positive acute respiratory failure with hypoxia. Patient somewhat anxious about what to expect on discharge.    Objective: Filed Vitals:   12/15/15 0400 12/15/15 0418 12/15/15 0735 12/15/15 1100  BP: 117/86  113/90 124/82  Pulse: 129  122 124  Temp:   99.8 F (37.7 C) 98.7 F (37.1 C)  TempSrc:   Oral Oral  Resp: Height:      Weight:  95.3 kg (210 lb 1.6 oz)    SpO2: 93%  97% 98%    Intake/Output Summary (Last 24 hours) at 12/15/15 1142 Last data filed at 12/15/15 0800  Gross per 24 hour  Intake 2065.2 ml  Output    750 ml  Net 1315.2 ml   Filed Weights   12/14/15 0500 12/14/15 1523 12/15/15 0418  Weight: 97.4 kg (214 lb 11.7 oz) 96.2 kg (212 lb 1.3 oz) 95.3 kg (210 lb 1.6 oz)    Examination:  General: A/O 4, positive acute respiratory distress Eyes: negative scleral hemorrhage, negative anisocoria, negative icterus ENT: Negative Runny nose, negative gingival bleeding, Neck:  Negative scars, masses, torticollis, lymphadenopathy, JVD Lungs: Clear to auscultation bilaterally without wheezes or crackles Cardiovascular: Regular rate and rhythm without murmur gallop or rub normal S1 and S2 Abdomen: negative abdominal pain, nondistended, positive soft, bowel sounds, no rebound, no ascites, no appreciable mass Extremities: No significant cyanosis, clubbing, or edema bilateral lower extremities Skin: Multiple facial lacerations  (secondary LOC)  Psychiatric:  Negative depression,Positive anxiety concerning to expect on discharge, negative fatigue, negative mania  Central nervous system:  Cranial nerves II through XII intact, tongue/uvula midline, all extremities muscle strength 5/5, sensation intact throughout, negative dysarthria, negative expressive aphasia, negative receptive aphasia.  .     Data Reviewed: Care during the described time interval was provided by me .  I have reviewed this patient's available data, including medical history, events of note, physical examination, and all test results as part of my evaluation. I have personally reviewed and interpreted all radiology studies.  CBC:  Recent Labs Lab 12/12/15 2248 12/12/15 2303 12/13/15 0234 12/14/15 0355 12/15/15 0233  WBC 12.7*  --  10.5 10.3 8.1  NEUTROABS 9.8*  --   --   --   --   HGB 15.8 17.7* 15.3 14.3 14.0  HCT 49.0 52.0 46.9 44.9 43.9  MCV 93.3  --  91.1 93.2 92.4  PLT 128*  --  129* 133* 142*   Basic Metabolic Panel:  Recent Labs Lab 12/12/15 2248 12/12/15 2303 12/13/15 0234 12/14/15 0355  NA 135 138 135 134*  K 3.9 4.4 3.8 3.6  CL 102 101 106 102  CO2 22  --  21* 25  GLUCOSE  150* 147* 119* 106*  BUN 12 18 11 8   CREATININE 1.43* 1.20 1.11 0.93  CALCIUM 8.2*  --  8.2* 8.3*  MG  --   --  1.7  --   PHOS  --   --  2.5  --    GFR: Estimated Creatinine Clearance: 111.8 mL/min (by C-G formula based on Cr of 0.93). Liver Function Tests:  Recent Labs Lab 12/12/15 2248 12/13/15 0234  AST 126* 114*  ALT 61 60  ALKPHOS 84 79  BILITOT 2.1* 1.4*  PROT 6.6 6.4*  ALBUMIN 3.2* 3.1*   No results for input(s): LIPASE, AMYLASE in the last 168 hours. No results for input(s): AMMONIA in the last 168 hours. Coagulation Profile:  Recent Labs Lab 12/12/15 2248  INR 1.14   Cardiac Enzymes:  Recent Labs Lab 12/13/15 0234 12/13/15 0855 12/13/15 1453  TROPONINI 0.38* 0.48* 0.36*   BNP (last 3 results) No results for  input(s): PROBNP in the last 8760 hours. HbA1C:  Recent Labs  12/13/15 0234  HGBA1C 5.4   CBG:  Recent Labs Lab 12/14/15 0412 12/14/15 0824 12/14/15 1214 12/14/15 1605 12/14/15 2004  GLUCAP 132* 110* 108* 96 119*   Lipid Profile: No results for input(s): CHOL, HDL, LDLCALC, TRIG, CHOLHDL, LDLDIRECT in the last 72 hours. Thyroid Function Tests: No results for input(s): TSH, T4TOTAL, FREET4, T3FREE, THYROIDAB in the last 72 hours. Anemia Panel: No results for input(s): VITAMINB12, FOLATE, FERRITIN, TIBC, IRON, RETICCTPCT in the last 72 hours. Urine analysis:    Component Value Date/Time   COLORURINE YELLOW 02/01/2009 1447   APPEARANCEUR CLOUDY* 02/01/2009 1447   LABSPEC 1.030 02/01/2009 1447   PHURINE 8.5* 02/01/2009 1447   GLUCOSEU NEGATIVE 02/01/2009 1447   HGBUR NEGATIVE 02/01/2009 1447   BILIRUBINUR NEGATIVE 02/01/2009 1447   KETONESUR 15* 02/01/2009 1447   PROTEINUR 100* 02/01/2009 1447   UROBILINOGEN 1.0 02/01/2009 1447   NITRITE NEGATIVE 02/01/2009 1447   LEUKOCYTESUR NEGATIVE 02/01/2009 1447   Sepsis Labs: @LABRCNTIP (procalcitonin:4,lacticidven:4)  ) Recent Results (from the past 240 hour(s))  MRSA PCR Screening     Status: None   Collection Time: 12/13/15  4:46 AM  Result Value Ref Range Status   MRSA by PCR NEGATIVE NEGATIVE Final    Comment:        The GeneXpert MRSA Assay (FDA approved for NASAL specimens only), is one component of a comprehensive MRSA colonization surveillance program. It is not intended to diagnose MRSA infection nor to guide or monitor treatment for MRSA infections.          Radiology Studies: No results found.      Scheduled Meds: . apixaban  10 mg Oral BID   Followed by  . [START ON 12/22/2015] apixaban  5 mg Oral BID  . gabapentin  800 mg Oral QID   Continuous Infusions:     LOS: 2 days    Time spent: 40 minutes    WOODS, Roselind Messier, MD [facility] Pager [phone]   If 7PM-7AM, please  contact night-coverage [email or web address] Password [facility] 12/15/2015, 11:42 AM

## 2015-12-15 NOTE — Progress Notes (Signed)
ANTICOAGULATION CONSULT NOTE   Pharmacy Consult for Heparin>> apixiban   Indication: pulmonary embolus  Allergies  Allergen Reactions  . Tramadol Anaphylaxis and Rash  . Other Other (See Comments)    Rembrant Whitening strips  "fat lips"    Patient Measurements: Height: 5\' 9"  (175.3 cm) Weight: 210 lb 1.6 oz (95.3 kg) IBW/kg (Calculated) : 70.7 Heparin Dosing Weight: 90 kg  Vital Signs: Temp: 99.8 F (37.7 C) (06/15 0735) Temp Source: Oral (06/15 0735) BP: 113/90 mmHg (06/15 0735) Pulse Rate: 122 (06/15 0735)  Labs:  Recent Labs  12/12/15 2248 12/12/15 2303 12/13/15 0234  12/13/15 0855 12/13/15 1453 12/14/15 0355 12/15/15 0233  HGB 15.8 17.7* 15.3  --   --   --  14.3 14.0  HCT 49.0 52.0 46.9  --   --   --  44.9 43.9  PLT 128*  --  129*  --   --   --  133* 142*  LABPROT 14.8  --   --   --   --   --   --   --   INR 1.14  --   --   --   --   --   --   --   HEPARINUNFRC  --   --   --   < > 0.33 0.38 0.27* 0.25*  CREATININE 1.43* 1.20 1.11  --   --   --  0.93  --   TROPONINI  --   --  0.38*  --  0.48* 0.36*  --   --   < > = values in this interval not displayed.  Estimated Creatinine Clearance: 111.8 mL/min (by C-G formula based on Cr of 0.93).  Assessment: 45 y.o. male on heparin for saddle PE. Pharmacy consulted to change to apixiban -Hg= 14, plt= 142, SCr= 0.93, CrCl > 100  Goal of Therapy:  Heparin level 0.3-0.7 units/ml Monitor platelets by anticoagulation protocol: Yes   Plan:  -Discontinue heparin -Apixiban 10mg  po bid for 7 days then 5mg  po bid (beginning 6/22)  Harland German, Pharm D 12/15/2015 9:42 AM

## 2015-12-16 ENCOUNTER — Inpatient Hospital Stay (HOSPITAL_COMMUNITY): Payer: Medicare Other

## 2015-12-16 LAB — URINALYSIS, ROUTINE W REFLEX MICROSCOPIC
Bilirubin Urine: NEGATIVE
Glucose, UA: NEGATIVE mg/dL
Hgb urine dipstick: NEGATIVE
Ketones, ur: NEGATIVE mg/dL
Leukocytes, UA: NEGATIVE
Nitrite: NEGATIVE
Protein, ur: NEGATIVE mg/dL
Specific Gravity, Urine: 1.012 (ref 1.005–1.030)
pH: 7.5 (ref 5.0–8.0)

## 2015-12-16 LAB — CARDIOLIPIN ANTIBODIES, IGG, IGM, IGA

## 2015-12-16 LAB — CBC
HEMATOCRIT: 42.7 % (ref 39.0–52.0)
HEMOGLOBIN: 13.6 g/dL (ref 13.0–17.0)
MCH: 29.3 pg (ref 26.0–34.0)
MCHC: 31.9 g/dL (ref 30.0–36.0)
MCV: 92 fL (ref 78.0–100.0)
PLATELETS: 150 10*3/uL (ref 150–400)
RBC: 4.64 MIL/uL (ref 4.22–5.81)
RDW: 12.9 % (ref 11.5–15.5)
WBC: 7.8 10*3/uL (ref 4.0–10.5)

## 2015-12-16 MED ORDER — MORPHINE SULFATE (PF) 4 MG/ML IV SOLN
4.0000 mg | Freq: Once | INTRAVENOUS | Status: AC
  Administered 2015-12-16: 4 mg via INTRAVENOUS
  Filled 2015-12-16: qty 1

## 2015-12-16 MED ORDER — SODIUM CHLORIDE 0.9 % IV BOLUS (SEPSIS)
500.0000 mL | Freq: Once | INTRAVENOUS | Status: AC
  Administered 2015-12-16: 500 mL via INTRAVENOUS

## 2015-12-16 MED ORDER — ACETAMINOPHEN 325 MG PO TABS
650.0000 mg | ORAL_TABLET | ORAL | Status: DC | PRN
  Administered 2015-12-16 – 2015-12-18 (×4): 650 mg via ORAL
  Filled 2015-12-16 (×6): qty 2

## 2015-12-16 NOTE — Progress Notes (Signed)
PROGRESS NOTE    Jared Bass  GEX:[id number] DOB: October 07, 1970 DOA: 12/12/2015 PCP: Sherrie Mustache, MD   Brief Narrative:  46 y.o. WM PMHx Chronic neck pain, C-spine DDD, S/P neck fusion C5-C7, HTN, A. fib,  Presents to the Emergency Department complaining of SOB. Patient presents via EMS for evaluation of shortness of breath has been ongoing for the last 4 days. He has a history of atrial fibrillation but has not been taking his propanolol for this for the last month. He states he has been trying to control his symptoms with his breathing. Over the last 4 days he has had increased shortness of breath and cough with associated chest pain. He had a syncopal event today and hit his head. His sister called EMS because he was too sick to call. Per EMS he was in a atrial fibrillation with RVR. They gave him adenosine 6 mg, 12 mg, 12 mg with no change in his rhythm. They did have intermittent episodes of hypotension into the 70s. He received 1 L normal saline prior to ED arrival.   Assessment & Plan:   Active Problems:   Saddle pulmonary embolus (HCC)   Acute respiratory failure with hypoxia (HCC)   Pulmonary hypertension (HCC)   Acute renal failure (HCC)   Closed fracture of nasal bone   Faintness   Acute saddle pulmonary embolism with acute cor pulmonale (HCC)   Hypotension   Syncope and collapse  Saddle PE / Evidence of right heart strain on CTA. -Continue heparin drip -Hypercoagulability panel; since patient has acute clot and was started on heparin will not be able to obtain anti-thrombin 3, protein C/S, lupus anticoagulant. However will obtain remainder of hypercoagulability panel. -Beta-2 glycoprotein, homocysteine, factor V Leiden, prothrombin gene mutation, cardiolipin antibodies pending -Given patient's family history most likely positive for a genetic mutation. Will need to follow-up with Hematology as outpatient -Eliquis per pharmacy; social workers provided [employer]  voucher  Acute Hypoxic Respiratory Failure -See saddle PE -Continuous pulse ox -Obtain ambulatory SPO2 on 6/15 -Titrate O2 to maintain SPO2 > 93%  -SATURATION QUALIFICATIONS: (This note is used to comply with regulatory documentation for home oxygen) Patient Saturations on Room Air at Rest = 88% Patient Saturations on Room Air while Ambulating =% Patient Saturations on 3L = 91% Please briefly explain why patient needs home oxygen:Patient unable to sustain 02 saturation in the 90's while not on oxygen. Pt. Began walking on 2L of 02 and quickly dropped to 87% adjusted patient up to 3L and he was able to maintain 02 saturation in the 90's during ambulation.  -Patient will require home O2 2 L when sedentary, and increased to 3 L during exertion via [place] 24 hours per day i/o maintain SPO2> 93% -Have requested home O2 equipment to include O2 concentrator. Patient continues to require 24-hour O2, spoke with sister today who had not received call from DME company to schedule O2 equipment delivery. Awaiting NCM Marylene Land, spoke with Medicaid delivery tomorrow?  Severe pulmonary Hypertension  -Consistent with a large clot burden -6/15 started Eliquis   Hypotension  - Resolved with IVF. Likely secondary to right heart strain from PE.  Acute Renal Failure  - Likely due to hypotension. -Resolved  Hyperglycemia -6/13 hemoglobin A1c= 5.4   Syncope and collapse - Presumably secondary to hypotension from acute PE.  Mild Comminuted Nasal Bone Fracture/Nasal Bridge Laceration -Sutures in place stable  Pain management -OxyIR 5 mg QID PRN    DVT prophylaxis: Treatment dose heparin Code Status:  Full Family Communication: None available Disposition Plan: Next 24-48 hours; awaiting insurance company authorization to deliver home O2 equipment   Consultants:  NA  Procedures/Significant Events:  EKG 6/12: Sinus tachycardia. With inverted T waves across precordial leads. CTA CHEST 6/13:  Saddle PE with clot extending into bilateral main pulmonary arteries and segmental arteries on my review. RV/LV ratio 2.1. Mild infarct right lung base.  CT HEAD/NECK W/O 6/13: No evidence for traumatic intracranial injury. Mild comminuted fracture involving nasal bone.  6/13 echocardiogram;Left ventricle: mild concentric hypertrophy. -LVEF= 65% to 70%.  - Right ventricle: Moderately dilated.  - Pulmonary arteries:  PA peak pressure: 60 mm Hg (S). 6/13 bilateral lower extremity Doppler; negative DVT/SVT. Sluggish blood flow bilateral   Cultures NA  Antimicrobials: NA   Devices NA   LINES / TUBES:      Continuous Infusions:     Subjective: 6/16 A/O 4, positive acute respiratory failure with hypoxia. Patient continues to be anxious concerning discharge requests to remain in hospital several more days.      Objective: Filed Vitals:   12/16/15 0706 12/16/15 0900 12/16/15 1205 12/16/15 1420  BP: 113/71  128/92 113/82  Pulse: 103 144 110 101  Temp: 98.4 F (36.9 C)  98.4 F (36.9 C) 99.4 F (37.4 C)  TempSrc: Oral  Oral Oral  Resp: 26 16 22 15   Height:      Weight:      SpO2: 92% 94% 94% 98%    Intake/Output Summary (Last 24 hours) at 12/16/15 1817 Last data filed at 12/16/15 1020  Gross per 24 hour  Intake   1730 ml  Output   2350 ml  Net   -620 ml   Filed Weights   12/14/15 1523 12/15/15 0418 12/16/15 0331  Weight: 96.2 kg (212 lb 1.3 oz) 95.3 kg (210 lb 1.6 oz) 94.1 kg (207 lb 7.3 oz)    Examination:  General: A/O 4, positive acute respiratory distress Eyes: negative scleral hemorrhage, negative anisocoria, negative icterus ENT: Negative Runny nose, negative gingival bleeding, Neck:  Negative scars, masses, torticollis, lymphadenopathy, JVD Lungs: Clear to auscultation bilaterally without wheezes or crackles Cardiovascular: Regular rate and rhythm without murmur gallop or rub normal S1 and S2 Abdomen: negative abdominal pain, nondistended, positive  soft, bowel sounds, no rebound, no ascites, no appreciable mass Extremities: No significant cyanosis, clubbing, or edema bilateral lower extremities Skin: Multiple facial lacerations (secondary LOC)  Psychiatric:  Negative depression,Positive anxiety concerning to expect on discharge, negative fatigue, negative mania  Central nervous system:  Cranial nerves II through XII intact, tongue/uvula midline, all extremities muscle strength 5/5, sensation intact throughout, negative dysarthria, negative expressive aphasia, negative receptive aphasia.  .     Data Reviewed: Care during the described time interval was provided by me .  I have reviewed this patient's available data, including medical history, events of note, physical examination, and all test results as part of my evaluation. I have personally reviewed and interpreted all radiology studies.  CBC:  Recent Labs Lab 12/12/15 2248 12/12/15 2303 12/13/15 0234 12/14/15 0355 12/15/15 0233 12/16/15 0454  WBC 12.7*  --  10.5 10.3 8.1 7.8  NEUTROABS 9.8*  --   --   --   --   --   HGB 15.8 17.7* 15.3 14.3 14.0 13.6  HCT 49.0 52.0 46.9 44.9 43.9 42.7  MCV 93.3  --  91.1 93.2 92.4 92.0  PLT 128*  --  129* 133* 142* 150   Basic Metabolic Panel:  Recent  Labs Lab 12/12/15 2248 12/12/15 2303 12/13/15 0234 12/14/15 0355  NA 135 138 135 134*  K 3.9 4.4 3.8 3.6  CL 102 101 106 102  CO2 22  --  21* 25  GLUCOSE 150* 147* 119* 106*  BUN CREATININE 1.43* 1.20 1.11 0.93  CALCIUM 8.2*  --  8.2* 8.3*  MG  --   --  1.7  --   PHOS  --   --  2.5  --    GFR: Estimated Creatinine Clearance: 111.3 mL/min (by C-G formula based on Cr of 0.93). Liver Function Tests:  Recent Labs Lab 12/12/15 2248 12/13/15 0234  AST 126* 114*  ALT 61 60  ALKPHOS 84 79  BILITOT 2.1* 1.4*  PROT 6.6 6.4*  ALBUMIN 3.2* 3.1*   No results for input(s): LIPASE, AMYLASE in the last 168 hours. No results for input(s): AMMONIA in the last 168  hours. Coagulation Profile:  Recent Labs Lab 12/12/15 2248  INR 1.14   Cardiac Enzymes:  Recent Labs Lab 12/13/15 0234 12/13/15 0855 12/13/15 1453  TROPONINI 0.38* 0.48* 0.36*   BNP (last 3 results) No results for input(s): PROBNP in the last 8760 hours. HbA1C: No results for input(s): HGBA1C in the last 72 hours. CBG:  Recent Labs Lab 12/14/15 0412 12/14/15 0824 12/14/15 1214 12/14/15 1605 12/14/15 2004  GLUCAP 132* 110* 108* 96 119*   Lipid Profile: No results for input(s): CHOL, HDL, LDLCALC, TRIG, CHOLHDL, LDLDIRECT in the last 72 hours. Thyroid Function Tests: No results for input(s): TSH, T4TOTAL, FREET4, T3FREE, THYROIDAB in the last 72 hours. Anemia Panel: No results for input(s): VITAMINB12, FOLATE, FERRITIN, TIBC, IRON, RETICCTPCT in the last 72 hours. Urine analysis:    Component Value Date/Time   COLORURINE YELLOW 12/16/2015 0400   APPEARANCEUR CLEAR 12/16/2015 0400   LABSPEC 1.012 12/16/2015 0400   PHURINE 7.5 12/16/2015 0400   GLUCOSEU NEGATIVE 12/16/2015 0400   HGBUR NEGATIVE 12/16/2015 0400   BILIRUBINUR NEGATIVE 12/16/2015 0400   KETONESUR NEGATIVE 12/16/2015 0400   PROTEINUR NEGATIVE 12/16/2015 0400   UROBILINOGEN 1.0 02/01/2009 1447   NITRITE NEGATIVE 12/16/2015 0400   LEUKOCYTESUR NEGATIVE 12/16/2015 0400   Sepsis Labs: (procalcitonin:4,lacticidven:4)  ) Recent Results (from the past 240 hour(s))  MRSA PCR Screening     Status: None   Collection Time: 12/13/15  4:46 AM  Result Value Ref Range Status   MRSA by PCR NEGATIVE NEGATIVE Final    Comment:        The GeneXpert MRSA Assay (FDA approved for NASAL specimens only), is one component of a comprehensive MRSA colonization surveillance program. It is not intended to diagnose MRSA infection nor to guide or monitor treatment for MRSA infections.          Radiology Studies: Dg Chest Port 1 View  12/16/2015  CLINICAL DATA:  Acute onset of fever and cough.   Initial encounter. EXAM: PORTABLE CHEST 1 VIEW COMPARISON:  Chest radiograph performed 12/12/2015, and CTA of the chest performed 12/13/2015 FINDINGS: The lungs are well-aerated. Mild vascular congestion is noted. Mild left basilar opacity may reflect atelectasis or mild pneumonia. There is no evidence of pleural effusion or pneumothorax. The cardiomediastinal silhouette is borderline enlarged. No acute osseous abnormalities are seen. Cervical spinal fusion hardware is partially imaged. IMPRESSION: Mild vascular congestion and borderline cardiomegaly noted. Mild left basilar opacity may reflect atelectasis or mild pneumonia. Electronically Signed   By: Roanna Raider M.D.   On: 12/16/2015 04:02  Scheduled Meds: . apixaban  10 mg Oral BID   Followed by  . [START ON 12/22/2015] apixaban  5 mg Oral BID  . gabapentin  800 mg Oral QID   Continuous Infusions:     LOS: 3 days    Time spent: 40 minutes    Daevon Holdren, Roselind Messier, MD [facility] Pager [phone]   If 7PM-7AM, please contact night-coverage [email or web address] Password [email or web address] 12/16/2015, 6:17 PM

## 2015-12-16 NOTE — Care Management Important Message (Signed)
Important Message  Patient Details  Name: Jared Bass MRN: [id number] Date of Birth: [birth year]   Medicare Important Message Given:  Yes    Kyla Balzarine 12/16/2015, 12:13 PM

## 2015-12-17 LAB — BASIC METABOLIC PANEL
Anion gap: 8 (ref 5–15)
BUN: 6 mg/dL (ref 6–20)
CHLORIDE: 98 mmol/L — AB (ref 101–111)
CO2: 31 mmol/L (ref 22–32)
CREATININE: 0.94 mg/dL (ref 0.61–1.24)
Calcium: 8.9 mg/dL (ref 8.9–10.3)
GFR calc non Af Amer: >60 mL/min (ref >60)
GLUCOSE: 103 mg/dL — AB (ref 65–99)
Potassium: 3.8 mmol/L (ref 3.5–5.1)
Sodium: 137 mmol/L (ref 135–145)

## 2015-12-17 LAB — BLOOD CULTURE ID PANEL (REFLEXED)
Acinetobacter baumannii: NOT DETECTED
Candida albicans: NOT DETECTED
Candida glabrata: NOT DETECTED
Candida krusei: NOT DETECTED
Candida parapsilosis: NOT DETECTED
Candida tropicalis: NOT DETECTED
Carbapenem resistance: NOT DETECTED
Enterobacter cloacae complex: NOT DETECTED
Enterobacteriaceae species: NOT DETECTED
Enterococcus species: NOT DETECTED
Escherichia coli: NOT DETECTED
Haemophilus influenzae: NOT DETECTED
Klebsiella oxytoca: NOT DETECTED
Klebsiella pneumoniae: NOT DETECTED
Listeria monocytogenes: NOT DETECTED
Methicillin resistance: NOT DETECTED
Neisseria meningitidis: NOT DETECTED
Proteus species: NOT DETECTED
Pseudomonas aeruginosa: NOT DETECTED
Serratia marcescens: NOT DETECTED
Staphylococcus aureus (BCID): NOT DETECTED
Staphylococcus species: DETECTED — AB
Streptococcus agalactiae: NOT DETECTED
Streptococcus pneumoniae: NOT DETECTED
Streptococcus pyogenes: NOT DETECTED
Streptococcus species: NOT DETECTED
Vancomycin resistance: NOT DETECTED

## 2015-12-17 LAB — MAGNESIUM: Magnesium: 2.2 mg/dL (ref 1.7–2.4)

## 2015-12-17 NOTE — Progress Notes (Signed)
8 beats NSVT noted on telemetry.  Pt asymptomatic, VSS.  Will continue to monitor. Jared Bass

## 2015-12-17 NOTE — Progress Notes (Signed)
Cm received call from RN requesting home O2 tank for pt.  Cm called [facility] DME rep, Fayrene Fearing to please deliver the tank so pt can discharge.  No other CM needs were communicated.

## 2015-12-17 NOTE — Progress Notes (Signed)
PHARMACY - PHYSICIAN COMMUNICATION CRITICAL VALUE ALERT - BLOOD CULTURE IDENTIFICATION (BCID)  Results for orders placed or performed during the hospital encounter of 12/12/15  Blood Culture ID Panel (Reflexed) (Collected: 12/16/2015  4:56 AM)  Result Value Ref Range   Enterococcus species NOT DETECTED NOT DETECTED   Vancomycin resistance NOT DETECTED NOT DETECTED   Listeria monocytogenes NOT DETECTED NOT DETECTED   Staphylococcus species DETECTED (A) NOT DETECTED   Staphylococcus aureus NOT DETECTED NOT DETECTED   Methicillin resistance NOT DETECTED NOT DETECTED   Streptococcus species NOT DETECTED NOT DETECTED   Streptococcus agalactiae NOT DETECTED NOT DETECTED   Streptococcus pneumoniae NOT DETECTED NOT DETECTED   Streptococcus pyogenes NOT DETECTED NOT DETECTED   Acinetobacter baumannii NOT DETECTED NOT DETECTED   Enterobacteriaceae species NOT DETECTED NOT DETECTED   Enterobacter cloacae complex NOT DETECTED NOT DETECTED   Escherichia coli NOT DETECTED NOT DETECTED   Klebsiella oxytoca NOT DETECTED NOT DETECTED   Klebsiella pneumoniae NOT DETECTED NOT DETECTED   Proteus species NOT DETECTED NOT DETECTED   Serratia marcescens NOT DETECTED NOT DETECTED   Carbapenem resistance NOT DETECTED NOT DETECTED   Haemophilus influenzae NOT DETECTED NOT DETECTED   Neisseria meningitidis NOT DETECTED NOT DETECTED   Pseudomonas aeruginosa NOT DETECTED NOT DETECTED   Candida albicans NOT DETECTED NOT DETECTED   Candida glabrata NOT DETECTED NOT DETECTED   Candida krusei NOT DETECTED NOT DETECTED   Candida parapsilosis NOT DETECTED NOT DETECTED   Candida tropicalis NOT DETECTED NOT DETECTED    Name of physician (or Provider) Contacted: T.Callahan, NP  Changes to prescribed antibiotics required: No abx currently indicated, will f/u w/ rounding team.  Vernard Gambles, PharmD, BCPS  12/17/2015  6:43 AM

## 2015-12-17 NOTE — Progress Notes (Signed)
PROGRESS NOTE    Jared Bass  ZOX:[id number] DOB: June 04, 1971 DOA: 12/12/2015 PCP: Sherrie Mustache, MD   Brief Narrative:  45 y.o. WM PMHx Chronic neck pain, C-spine DDD, S/P neck fusion C5-C7, HTN, A. fib,  Presents to the Emergency Department complaining of SOB. Patient presents via EMS for evaluation of shortness of breath has been ongoing for the last 4 days. He has a history of atrial fibrillation but has not been taking his propanolol for this for the last month. He states he has been trying to control his symptoms with his breathing. Over the last 4 days he has had increased shortness of breath and cough with associated chest pain. He had a syncopal event today and hit his head. His sister called EMS because he was too sick to call. Per EMS he was in a atrial fibrillation with RVR. They gave him adenosine 6 mg, 12 mg, 12 mg with no change in his rhythm. They did have intermittent episodes of hypotension into the 70s. He received 1 L normal saline prior to ED arrival.   Assessment & Plan:   Active Problems:   Saddle pulmonary embolus (HCC)   Acute respiratory failure with hypoxia (HCC)   Pulmonary hypertension (HCC)   Acute renal failure (HCC)   Closed fracture of nasal bone   Faintness   Acute saddle pulmonary embolism with acute cor pulmonale (HCC)   Hypotension   Syncope and collapse  Saddle PE / Evidence of right heart strain on CTA. -Continue heparin drip -Hypercoagulability panel; since patient has acute clot and was started on heparin will not be able to obtain anti-thrombin 3, protein C/S, lupus anticoagulant. However will obtain remainder of hypercoagulability panel. -Beta-2 glycoprotein/Homocysteine/Cardiolipin Antibodies negative,, -Factor V Leiden/ Prothrombin gene mutation,  pending -Given patient's family history most likely positive for a genetic mutation. Will need to follow-up with Hematology as outpatient -Eliquis per pharmacy; social workers provided [employer]  voucher  Acute Hypoxic Respiratory Failure -See saddle PE -Continuous pulse ox -Obtain ambulatory SPO2 on 6/15 -Titrate O2 to maintain SPO2 > 93%  -SATURATION QUALIFICATIONS: (This note is used to comply with regulatory documentation for home oxygen) Patient Saturations on Room Air at Rest = 88% Patient Saturations on Room Air while Ambulating =% Patient Saturations on 3L = 91% Please briefly explain why patient needs home oxygen:Patient unable to sustain 02 saturation in the 90's while not on oxygen. Pt. Began walking on 2L of 02 and quickly dropped to 87% adjusted patient up to 3L and he was able to maintain 02 saturation in the 90's during ambulation.  -Patient will require home O2 2 L when sedentary, and increased to 3 L during exertion via [place] 24 hours per day i/o maintain SPO2> 93% -Have requested home O2 equipment to include O2 concentrator. Patient continues to require 24-hour O2. -6/17 Still awaiting DME company to deliver 02 to allow discharge. Again contact NCM first thing in A.m.  Severe pulmonary Hypertension  -Consistent with a large clot burden -6/15 started Eliquis   Hypotension  - Resolved with IVF. Likely secondary to right heart strain from PE.  Acute Renal Failure  - Likely due to hypotension. -Resolved  Hyperglycemia -6/13 hemoglobin A1c= 5.4   Syncope and collapse - Presumably secondary to hypotension from acute PE.  Mild Comminuted Nasal Bone Fracture/Nasal Bridge Laceration -Sutures in place stable  Pain management -OxyIR 5 mg QID PRN    DVT prophylaxis: Treatment dose heparin Code Status: Full Family Communication: None available Disposition Plan:  Next 24-48 hours; awaiting insurance company authorization to deliver home O2 equipment   Consultants:  NA  Procedures/Significant Events:  EKG 6/12: Sinus tachycardia. With inverted T waves across precordial leads. CTA CHEST 6/13: Saddle PE with clot extending into bilateral main pulmonary  arteries and segmental arteries on my review. RV/LV ratio 2.1. Mild infarct right lung base.  CT HEAD/NECK W/O 6/13: No evidence for traumatic intracranial injury. Mild comminuted fracture involving nasal bone.  6/13 echocardiogram;Left ventricle: mild concentric hypertrophy. -LVEF= 65% to 70%.  - Right ventricle: Moderately dilated.  - Pulmonary arteries:  PA peak pressure: 60 mm Hg (S). 6/13 bilateral lower extremity Doppler; negative DVT/SVT. Sluggish blood flow bilateral   Cultures NA  Antimicrobials: NA   Devices NA   LINES / TUBES:      Continuous Infusions:     Subjective: 6/17 A/O 4, positive acute respiratory failure with hypoxia. Patient continues to be anxious concerning discharge. Patient upset because mother and sister who were supposed to be available for O2 delivery today left on day trip and will not be available until 2200 tonight .      Objective: Filed Vitals:   12/17/15 0311 12/17/15 0751 12/17/15 0800 12/17/15 1100  BP: 110/85 114/82  104/53  Pulse: 94 100  104  Temp: 98.1 F (36.7 C) 99 F (37.2 C)  98.9 F (37.2 C)  TempSrc: Oral Oral  Oral  Resp: 22 15  28   Height:      Weight: 94.1 kg (207 lb 7.3 oz)     SpO2: 100% 98% 98% 96%    Intake/Output Summary (Last 24 hours) at 12/17/15 1212 Last data filed at 12/17/15 0421  Gross per 24 hour  Intake    480 ml  Output    600 ml  Net   -120 ml   Filed Weights   12/15/15 0418 12/16/15 0331 12/17/15 0311  Weight: 95.3 kg (210 lb 1.6 oz) 94.1 kg (207 lb 7.3 oz) 94.1 kg (207 lb 7.3 oz)    Examination:  General: A/O 4, positive acute respiratory distress Eyes: negative scleral hemorrhage, negative anisocoria, negative icterus ENT: Negative Runny nose, negative gingival bleeding, Neck:  Negative scars, masses, torticollis, lymphadenopathy, JVD Lungs: Clear to auscultation bilaterally without wheezes or crackles Cardiovascular: Regular rate and rhythm without murmur gallop or rub normal S1  and S2 Abdomen: negative abdominal pain, nondistended, positive soft, bowel sounds, no rebound, no ascites, no appreciable mass Extremities: No significant cyanosis, clubbing, or edema bilateral lower extremities Skin: Multiple facial lacerations (secondary LOC); healing without sign of infection  Psychiatric:  Negative depression,Positive anxiety concerning to expect on discharge, negative fatigue, negative mania  Central nervous system:  Cranial nerves II through XII intact, tongue/uvula midline, all extremities muscle strength 5/5, sensation intact throughout, negative dysarthria, negative expressive aphasia, negative receptive aphasia.  .     Data Reviewed: Care during the described time interval was provided by me .  I have reviewed this patient's available data, including medical history, events of note, physical examination, and all test results as part of my evaluation. I have personally reviewed and interpreted all radiology studies.  CBC:  Recent Labs Lab 12/12/15 2248 12/12/15 2303 12/13/15 0234 12/14/15 0355 12/15/15 0233 12/16/15 0454  WBC 12.7*  --  10.5 10.3 8.1 7.8  NEUTROABS 9.8*  --   --   --   --   --   HGB 15.8 17.7* 15.3 14.3 14.0 13.6  HCT 49.0 52.0 46.9 44.9 43.9 42.7  MCV 93.3  --  91.1 93.2 92.4 92.0  PLT 128*  --  129* 133* 142* 150   Basic Metabolic Panel:  Recent Labs Lab 12/12/15 2248 12/12/15 2303 12/13/15 0234 12/14/15 0355 12/17/15 0518  NA 135 138 135 134* 137  K 3.9 4.4 3.8 3.6 3.8  CL 102 101 106 102 98*  CO2 22  --  21* 25 31  GLUCOSE 150* 147* 119* 106* 103*  BUN 12 18 11 8 6   CREATININE 1.43* 1.20 1.11 0.93 0.94  CALCIUM 8.2*  --  8.2* 8.3* 8.9  MG  --   --  1.7  --  2.2  PHOS  --   --  2.5  --   --    GFR: Estimated Creatinine Clearance: 110.1 mL/min (by C-G formula based on Cr of 0.94). Liver Function Tests:  Recent Labs Lab 12/12/15 2248 12/13/15 0234  AST 126* 114*  ALT 61 60  ALKPHOS 84 79  BILITOT 2.1* 1.4*    PROT 6.6 6.4*  ALBUMIN 3.2* 3.1*   No results for input(s): LIPASE, AMYLASE in the last 168 hours. No results for input(s): AMMONIA in the last 168 hours. Coagulation Profile:  Recent Labs Lab 12/12/15 2248  INR 1.14   Cardiac Enzymes:  Recent Labs Lab 12/13/15 0234 12/13/15 0855 12/13/15 1453  TROPONINI 0.38* 0.48* 0.36*   BNP (last 3 results) No results for input(s): PROBNP in the last 8760 hours. HbA1C: No results for input(s): HGBA1C in the last 72 hours. CBG:  Recent Labs Lab 12/14/15 0412 12/14/15 0824 12/14/15 1214 12/14/15 1605 12/14/15 2004  GLUCAP 132* 110* 108* 96 119*   Lipid Profile: No results for input(s): CHOL, HDL, LDLCALC, TRIG, CHOLHDL, LDLDIRECT in the last 72 hours. Thyroid Function Tests: No results for input(s): TSH, T4TOTAL, FREET4, T3FREE, THYROIDAB in the last 72 hours. Anemia Panel: No results for input(s): VITAMINB12, FOLATE, FERRITIN, TIBC, IRON, RETICCTPCT in the last 72 hours. Urine analysis:    Component Value Date/Time   COLORURINE YELLOW 12/16/2015 0400   APPEARANCEUR CLEAR 12/16/2015 0400   LABSPEC 1.012 12/16/2015 0400   PHURINE 7.5 12/16/2015 0400   GLUCOSEU NEGATIVE 12/16/2015 0400   HGBUR NEGATIVE 12/16/2015 0400   BILIRUBINUR NEGATIVE 12/16/2015 0400   KETONESUR NEGATIVE 12/16/2015 0400   PROTEINUR NEGATIVE 12/16/2015 0400   UROBILINOGEN 1.0 02/01/2009 1447   NITRITE NEGATIVE 12/16/2015 0400   LEUKOCYTESUR NEGATIVE 12/16/2015 0400   Sepsis Labs: @LABRCNTIP (procalcitonin:4,lacticidven:4)  ) Recent Results (from the past 240 hour(s))  MRSA PCR Screening     Status: None   Collection Time: 12/13/15  4:46 AM  Result Value Ref Range Status   MRSA by PCR NEGATIVE NEGATIVE Final    Comment:        The GeneXpert MRSA Assay (FDA approved for NASAL specimens only), is one component of a comprehensive MRSA colonization surveillance program. It is not intended to diagnose MRSA infection nor to guide or monitor  treatment for MRSA infections.   Culture, blood (routine x 2)     Status: None (Preliminary result)   Collection Time: 12/16/15  4:56 AM  Result Value Ref Range Status   Specimen Description BLOOD RIGHT ANTECUBITAL  Final   Special Requests BOTTLES DRAWN AEROBIC AND ANAEROBIC 5CCEA  Final   Culture  Setup Time   Final    GRAM POSITIVE COCCI IN CLUSTERS AEROBIC BOTTLE ONLY CRITICAL RESULT CALLED TO, READ BACK BY AND VERIFIED WITH: V BRYK,PHARMD AT 0630 12/17/15 BY L BENFIELD  Culture PENDING  Incomplete   Report Status PENDING  Incomplete  Blood Culture ID Panel (Reflexed)     Status: Abnormal   Collection Time: 12/16/15  4:56 AM  Result Value Ref Range Status   Enterococcus species NOT DETECTED NOT DETECTED Final   Vancomycin resistance NOT DETECTED NOT DETECTED Final   Listeria monocytogenes NOT DETECTED NOT DETECTED Final   Staphylococcus species DETECTED (A) NOT DETECTED Final    Comment: CRITICAL RESULT CALLED TO, READ BACK BY AND VERIFIED WITH: V BRYK,PHARMD AT 0630 12/17/15 BY L BENFIELD    Staphylococcus aureus NOT DETECTED NOT DETECTED Final   Methicillin resistance NOT DETECTED NOT DETECTED Final   Streptococcus species NOT DETECTED NOT DETECTED Final   Streptococcus agalactiae NOT DETECTED NOT DETECTED Final   Streptococcus pneumoniae NOT DETECTED NOT DETECTED Final   Streptococcus pyogenes NOT DETECTED NOT DETECTED Final   Acinetobacter baumannii NOT DETECTED NOT DETECTED Final   Enterobacteriaceae species NOT DETECTED NOT DETECTED Final   Enterobacter cloacae complex NOT DETECTED NOT DETECTED Final   Escherichia coli NOT DETECTED NOT DETECTED Final   Klebsiella oxytoca NOT DETECTED NOT DETECTED Final   Klebsiella pneumoniae NOT DETECTED NOT DETECTED Final   Proteus species NOT DETECTED NOT DETECTED Final   Serratia marcescens NOT DETECTED NOT DETECTED Final   Carbapenem resistance NOT DETECTED NOT DETECTED Final   Haemophilus influenzae NOT DETECTED NOT DETECTED  Final   Neisseria meningitidis NOT DETECTED NOT DETECTED Final   Pseudomonas aeruginosa NOT DETECTED NOT DETECTED Final   Candida albicans NOT DETECTED NOT DETECTED Final   Candida glabrata NOT DETECTED NOT DETECTED Final   Candida krusei NOT DETECTED NOT DETECTED Final   Candida parapsilosis NOT DETECTED NOT DETECTED Final   Candida tropicalis NOT DETECTED NOT DETECTED Final         Radiology Studies: Dg Chest Port 1 View  12/16/2015  CLINICAL DATA:  Acute onset of fever and cough.  Initial encounter. EXAM: PORTABLE CHEST 1 VIEW COMPARISON:  Chest radiograph performed 12/12/2015, and CTA of the chest performed 12/13/2015 FINDINGS: The lungs are well-aerated. Mild vascular congestion is noted. Mild left basilar opacity may reflect atelectasis or mild pneumonia. There is no evidence of pleural effusion or pneumothorax. The cardiomediastinal silhouette is borderline enlarged. No acute osseous abnormalities are seen. Cervical spinal fusion hardware is partially imaged. IMPRESSION: Mild vascular congestion and borderline cardiomegaly noted. Mild left basilar opacity may reflect atelectasis or mild pneumonia. Electronically Signed   By: Roanna Raider M.D.   On: 12/16/2015 04:02        Scheduled Meds: . apixaban  10 mg Oral BID   Followed by  . [START ON 12/22/2015] apixaban  5 mg Oral BID  . gabapentin  800 mg Oral QID   Continuous Infusions:     LOS: 4 days    Time spent: 40 minutes    Shreyan Hinz, Roselind Messier, MD [facility] Pager [phone]   If 7PM-7AM, please contact night-coverage [email or web address] Password [email or web address] 12/17/2015, 12:12 PM

## 2015-12-18 MED ORDER — APIXABAN 5 MG PO TABS: 5.0000 mg | ORAL_TABLET | Freq: Two times a day (BID) | ORAL | Status: DC

## 2015-12-18 MED ORDER — APIXABAN 5 MG PO TABS: 10.0000 mg | ORAL_TABLET | Freq: Two times a day (BID) | ORAL | Status: DC

## 2015-12-18 NOTE — Progress Notes (Addendum)
Pt is for d/c to home with home oxygen in place. CM confirmed with [facility] Tawanna Cooler) once pt is d/c , nurse is to call [phone] ext [phone] and make them aware and [facility] will meet pt @ home with oxygen concentrator. Pt will transport to home with portable oxygen tank which was left @ pt's bedside from [facility]. Address confirmed with pt : [address], [address], [place], [address].Contact person: Kathy(sister) [phone].  Gae Gallop RN,BSN,CM [phone]

## 2015-12-18 NOTE — Progress Notes (Signed)
CM received call from RN Whitney asking if ok to discharge pt.  Pt has his O2 tank and Cm called [facility] DME[facility], Fayrene Fearing who states pt can be discharged.  [facility] will set up the concentrator at home. No other CM needs were communicated.

## 2015-12-18 NOTE — Discharge Summary (Signed)
Physician Discharge Summary  Jared Bass ZOX:[id number] DOB: 1971-04-18 DOA: 12/12/2015  PCP: Sherrie Mustache, MD  Admit date: 12/12/2015 Discharge date: 12/18/2015  Time spent: 60 minutes  Recommendations for Outpatient Follow-up:   Saddle PE / Evidence of right heart strain on CTA. -Continue heparin drip -Hypercoagulability panel; since patient has acute clot and was started on heparin will not be able to obtain anti-thrombin 3, protein C/S, lupus anticoagulant. However will obtain remainder of hypercoagulability panel. -Beta-2 glycoprotein/Homocysteine/Cardiolipin Antibodies negative,, -Factor V Leiden/ Prothrombin gene mutation, pending -Given patient's family history most likely positive for a genetic mutation. Will need to follow-up with Hematology as outpatient -Eliquis per pharmacy; social workers provided [employer] voucher  Acute Hypoxic Respiratory Failure -See saddle PE -Titrate O2 to maintain SPO2 > 93%  -SATURATION QUALIFICATIONS: (This note is used to comply with regulatory documentation for home oxygen) Patient Saturations on Room Air at Rest = 88% Patient Saturations on Room Air while Ambulating =% Patient Saturations on 3L = 91% Please briefly explain why patient needs home oxygen:Patient unable to sustain 02 saturation in the 90's while not on oxygen. Pt. Began walking on 2L of 02 and quickly dropped to 87% adjusted patient up to 3L and he was able to maintain 02 saturation in the 90's during ambulation.  -Patient will require home O2 2 L when sedentary, and increased to 3 L during exertion via [place] 24 hours per day i/o maintain SPO2> 93% -Have requested home O2 equipment to include O2 concentrator. Patient continues to require 24-hour O2. -Follow-up with Dr.Fayegh Jadali, in one week PE.  Severe pulmonary Hypertension  -Consistent with a large clot burden -6/15 started Eliquis   Hypotension  - Resolved with IVF. Likely secondary to right heart strain from  PE.  Acute Renal Failure  - Likely due to hypotension. -Resolved  Hyperglycemia -6/13 hemoglobin A1c= 5.4   Syncope and collapse - Presumably secondary to hypotension from acute PE.  Mild Comminuted Nasal Bone Fracture/Nasal Bridge Laceration -Sutures in place stable  Pain management -OxyIR 5 mg QID PRN     Discharge Diagnoses:  Active Problems:   Saddle pulmonary embolus (HCC)   Acute respiratory failure with hypoxia (HCC)   Pulmonary hypertension (HCC)   Acute renal failure (HCC)   Closed fracture of nasal bone   Faintness   Acute saddle pulmonary embolism with acute cor pulmonale (HCC)   Hypotension   Syncope and collapse   Discharge Condition: Stable  Diet recommendation: Regular  Filed Weights   12/16/15 0331 12/17/15 0311 12/18/15 0300  Weight: 94.1 kg (207 lb 7.3 oz) 94.1 kg (207 lb 7.3 oz) 94.3 kg (207 lb 14.3 oz)    History of present illness:  45 y.o. WM PMHx Chronic neck pain, C-spine DDD, S/P neck fusion C5-C7, HTN, A. fib,  Presents to the Emergency Department complaining of SOB. Patient presents via EMS for evaluation of shortness of breath has been ongoing for the last 4 days. He has a history of atrial fibrillation but has not been taking his propanolol for this for the last month. He states he has been trying to control his symptoms with his breathing. Over the last 4 days he has had increased shortness of breath and cough with associated chest pain. He had a syncopal event today and hit his head. His sister called EMS because he was too sick to call. Per EMS he was in a atrial fibrillation with RVR. They gave him adenosine 6 mg, 12 mg, 12 mg with no  change in his rhythm. They did have intermittent episodes of hypotension into the 70s. He received 1 L normal saline prior to ED arrival. During his hospital is a patient was diagnosed with saddle PE and right heart strain. Thrombolytics were not used. Patient was started on  Eliquis.     Procedures/Significant Events:  EKG 6/12: Sinus tachycardia. With inverted T waves across precordial leads. CTA CHEST 6/13: Saddle PE with clot extending into bilateral main pulmonary arteries and segmental arteries on my review. RV/LV ratio 2.1. Mild infarct right lung base.  CT HEAD/NECK W/O 6/13: No evidence for traumatic intracranial injury. Mild comminuted fracture involving nasal bone.  6/13 echocardiogram;Left ventricle: mild concentric hypertrophy. -LVEF= 65% to 70%.  - Right ventricle: Moderately dilated.  - Pulmonary arteries: PA peak pressure: 60 mm Hg (S). 6/13 bilateral lower extremity Doppler; negative DVT/SVT. Sluggish blood flow bilateral   Discharge Exam: Filed Vitals:   12/17/15 1939 12/18/15 0001 12/18/15 0300 12/18/15 0805  BP: 118/83 120/91 107/73 113/81  Pulse: 103 99 92 87  Temp: 98 F (36.7 C) 98.1 F (36.7 C) 98.3 F (36.8 C) 98.5 F (36.9 C)  TempSrc: Oral Oral Oral Oral  Resp: 22 20 16 17   Height:      Weight:   94.3 kg (207 lb 14.3 oz)   SpO2: 96% 99% 100% 96%    General: A/O 4, positive acute respiratory distress Eyes: negative scleral hemorrhage, negative anisocoria, negative icterus ENT: Negative Runny nose, negative gingival bleeding, Neck: Negative scars, masses, torticollis, lymphadenopathy, JVD Lungs: Clear to auscultation bilaterally without wheezes or crackles Cardiovascular: Regular rate and rhythm without murmur gallop or rub normal S1 and S2   Discharge Instructions     Medication List    STOP taking these medications        BENEFIBER Powd     Magnesium Oxide 500 MG Caps     tizanidine 2 MG capsule  Commonly known as:  ZANAFLEX      TAKE these medications        apixaban 5 MG Tabs tablet  Commonly known as:  ELIQUIS  Take 2 tablets (10 mg total) by mouth 2 (two) times daily.     apixaban 5 MG Tabs tablet  Commonly known as:  ELIQUIS  Take 1 tablet (5 mg total) by mouth 2 (two) times daily.   Start taking on:  12/22/2015     gabapentin 800 MG tablet  Commonly known as:  NEURONTIN  Take 1 tablet (800 mg total) by mouth every 6 (six) hours.     oxyCODONE 5 MG immediate release tablet  Commonly known as:  Oxy IR/ROXICODONE  Take 1 tablet (5 mg total) by mouth 5 (five) times daily as needed for severe pain.       Allergies  Allergen Reactions  . Tramadol Anaphylaxis and Rash  . Other Other (See Comments)    Rembrant Whitening strips  "fat lips"   Follow-up Information    Follow up with [facility].   Why:  home oxygen   Contact information:   [address] [place] [address] [phone]       Follow up with Sherrie Mustache, MD. Schedule an appointment as soon as possible for a visit in 1 week.   Specialty:  Internal Medicine   Contact information:   [address] Marya Fossa   [place] [address] [phone]        The results of significant diagnostics from this hospitalization (including  imaging, microbiology, ancillary and laboratory) are listed below for reference.    Significant Diagnostic Studies: Ct Head Wo Contrast  12/13/2015  CLINICAL DATA:  Acute onset of syncope and fall onto face. Concern for head or cervical spine injury. Initial encounter. EXAM: CT HEAD WITHOUT CONTRAST CT CERVICAL SPINE WITHOUT CONTRAST TECHNIQUE: Multidetector CT imaging of the head and cervical spine was performed following the standard protocol without intravenous contrast. Multiplanar CT image reconstructions of the cervical spine were also generated. COMPARISON:  CT of the cervical spine performed 08/27/2014, MRI of the cervical spine performed 10/09/2014, and CT of the head performed 05/12/2014 FINDINGS: CT HEAD FINDINGS There is no evidence of acute infarction, mass lesion, or intra- or extra-axial hemorrhage on CT. The posterior fossa, including the cerebellum, brainstem and fourth ventricle, is within normal limits. The third and lateral ventricles, and  basal ganglia are unremarkable in appearance. The cerebral hemispheres are symmetric in appearance, with normal gray-white differentiation. No mass effect or midline shift is seen. There is a mildly comminuted fracture involving the nasal bone. The orbits are within normal limits. There is partial opacification of the sphenoid sinus. The remaining paranasal sinuses and mastoid air cells are well-aerated. No significant soft tissue abnormalities are seen. CT CERVICAL SPINE FINDINGS There is no evidence of fracture or subluxation. Anterior cervical spinal fusion hardware is noted at C5-C7, with associated posterior osteophyte at C5-C6. Vertebral bodies demonstrate normal height and alignment. Intervertebral disc spaces are otherwise preserved. Prevertebral soft tissues are within normal limits. The thyroid gland is unremarkable in appearance. The visualized lung apices are clear. No significant soft tissue abnormalities are seen. IMPRESSION: 1. No evidence of traumatic intracranial injury. 2. Mildly comminuted fracture involving the nasal bone. 3. No evidence of fracture or subluxation along the cervical spine. 4. Partial opacification of the sphenoid sinus. 5. Anterior cervical spinal fusion at C5-C7, with underlying posterior osteophyte at C5-C6. Electronically Signed   By: Roanna Raider M.D.   On: 12/13/2015 01:39   Ct Angio Chest Pe W/cm &/or Wo Cm  12/13/2015  CLINICAL DATA:  Acute onset of syncope and fall. Concern for pulmonary embolus. Initial encounter. EXAM: CT ANGIOGRAPHY CHEST WITH CONTRAST TECHNIQUE: Multidetector CT imaging of the chest was performed using the standard protocol during bolus administration of intravenous contrast. Multiplanar CT image reconstructions and MIPs were obtained to evaluate the vascular anatomy. CONTRAST:  100 mL of Isovue 370 IV contrast COMPARISON:  Chest radiograph performed 12/12/2015 FINDINGS: Large saddle pulmonary embolus is noted, extending into all lobes of both  lungs, with apparent occlusion of many segmental branches. There is an RV/LV ratio of 2.1, compatible with significant right heart strain. Mild pulmonary infarct is noted at the right lung base. Mild patchy bilateral atelectasis is seen. Mild left-sided peribronchial thickening is seen. There is no evidence of pleural effusion or pneumothorax. No masses are identified; no abnormal focal contrast enhancement is seen. The mediastinum is otherwise unremarkable in appearance. No mediastinal lymphadenopathy is seen. No pericardial effusion is identified. The great vessels are grossly unremarkable in appearance. No axillary lymphadenopathy is seen. The thyroid gland is unremarkable in appearance. The visualized portions of the liver and spleen are unremarkable. The visualized portions of the pancreas, gallbladder, stomach, adrenal glands and kidneys are within normal limits. No acute osseous abnormalities are seen. Anterior cervical spinal fusion hardware is noted. Review of the MIP images confirms the above findings. IMPRESSION: 1. Large saddle pulmonary embolus noted, extending into all lobes of both lungs, with apparent  occlusion of many segmental branches. CT evidence of significant right heart strain (RV/LV Ratio = 2.1) consistent with at least submassive (intermediate risk) PE. The presence of right heart strain has been associated with an increased risk of morbidity and mortality. Please activate Code PE by paging [phone]. 2. Mild pulmonary infarct at the right lung base. Mild patchy bilateral atelectasis seen. Mild left-sided peribronchial thickening noted. Critical Value/emergent results were called by telephone at the time of interpretation on 12/13/2015 at 1:31 am to Dr. Tilden Fossa, who verbally acknowledged these results. Electronically Signed   By: Roanna Raider M.D.   On: 12/13/2015 01:33   Ct Cervical Spine Wo Contrast  12/13/2015  CLINICAL DATA:  Acute onset of syncope and fall onto face.  Concern for head or cervical spine injury. Initial encounter. EXAM: CT HEAD WITHOUT CONTRAST CT CERVICAL SPINE WITHOUT CONTRAST TECHNIQUE: Multidetector CT imaging of the head and cervical spine was performed following the standard protocol without intravenous contrast. Multiplanar CT image reconstructions of the cervical spine were also generated. COMPARISON:  CT of the cervical spine performed 08/27/2014, MRI of the cervical spine performed 10/09/2014, and CT of the head performed 05/12/2014 FINDINGS: CT HEAD FINDINGS There is no evidence of acute infarction, mass lesion, or intra- or extra-axial hemorrhage on CT. The posterior fossa, including the cerebellum, brainstem and fourth ventricle, is within normal limits. The third and lateral ventricles, and basal ganglia are unremarkable in appearance. The cerebral hemispheres are symmetric in appearance, with normal gray-white differentiation. No mass effect or midline shift is seen. There is a mildly comminuted fracture involving the nasal bone. The orbits are within normal limits. There is partial opacification of the sphenoid sinus. The remaining paranasal sinuses and mastoid air cells are well-aerated. No significant soft tissue abnormalities are seen. CT CERVICAL SPINE FINDINGS There is no evidence of fracture or subluxation. Anterior cervical spinal fusion hardware is noted at C5-C7, with associated posterior osteophyte at C5-C6. Vertebral bodies demonstrate normal height and alignment. Intervertebral disc spaces are otherwise preserved. Prevertebral soft tissues are within normal limits. The thyroid gland is unremarkable in appearance. The visualized lung apices are clear. No significant soft tissue abnormalities are seen. IMPRESSION: 1. No evidence of traumatic intracranial injury. 2. Mildly comminuted fracture involving the nasal bone. 3. No evidence of fracture or subluxation along the cervical spine. 4. Partial opacification of the sphenoid sinus. 5.  Anterior cervical spinal fusion at C5-C7, with underlying posterior osteophyte at C5-C6. Electronically Signed   By: Roanna Raider M.D.   On: 12/13/2015 01:39   Dg Chest Port 1 View  12/16/2015  CLINICAL DATA:  Acute onset of fever and cough.  Initial encounter. EXAM: PORTABLE CHEST 1 VIEW COMPARISON:  Chest radiograph performed 12/12/2015, and CTA of the chest performed 12/13/2015 FINDINGS: The lungs are well-aerated. Mild vascular congestion is noted. Mild left basilar opacity may reflect atelectasis or mild pneumonia. There is no evidence of pleural effusion or pneumothorax. The cardiomediastinal silhouette is borderline enlarged. No acute osseous abnormalities are seen. Cervical spinal fusion hardware is partially imaged. IMPRESSION: Mild vascular congestion and borderline cardiomegaly noted. Mild left basilar opacity may reflect atelectasis or mild pneumonia. Electronically Signed   By: Roanna Raider M.D.   On: 12/16/2015 04:02   Dg Chest Port 1 View  12/12/2015  CLINICAL DATA:  Acute onset of syncope. Head injury. Atrial fibrillation. Initial encounter. EXAM: PORTABLE CHEST 1 VIEW COMPARISON:  Chest radiograph performed 10/14/2013 FINDINGS: The lungs are well-aerated. Pulmonary vascularity is at the  upper limits of normal. There is no evidence of focal opacification, pleural effusion or pneumothorax. The cardiomediastinal silhouette is mildly enlarged. No acute osseous abnormalities are seen. External pacing pads are noted. Cervical spinal fusion hardware is partially imaged. IMPRESSION: Mild cardiomegaly.  Lungs remain grossly clear. Electronically Signed   By: Roanna Raider M.D.   On: 12/12/2015 23:11    Microbiology: Recent Results (from the past 240 hour(s))  MRSA PCR Screening     Status: None   Collection Time: 12/13/15  4:46 AM  Result Value Ref Range Status   MRSA by PCR NEGATIVE NEGATIVE Final    Comment:        The GeneXpert MRSA Assay (FDA approved for NASAL specimens only), is  one component of a comprehensive MRSA colonization surveillance program. It is not intended to diagnose MRSA infection nor to guide or monitor treatment for MRSA infections.   Culture, blood (routine x 2)     Status: Abnormal (Preliminary result)   Collection Time: 12/16/15  4:56 AM  Result Value Ref Range Status   Specimen Description BLOOD RIGHT ANTECUBITAL  Final   Special Requests BOTTLES DRAWN AEROBIC AND ANAEROBIC 5CCEA  Final   Culture  Setup Time   Final    GRAM POSITIVE COCCI IN CLUSTERS AEROBIC BOTTLE ONLY CRITICAL RESULT CALLED TO, READ BACK BY AND VERIFIED WITH: V BRYK,PHARMD AT 0630 12/17/15 BY L BENFIELD    Culture (A)  Final    STAPHYLOCOCCUS SPECIES (COAGULASE NEGATIVE) THE SIGNIFICANCE OF ISOLATING THIS ORGANISM FROM A SINGLE SET OF BLOOD CULTURES WHEN MULTIPLE SETS ARE DRAWN IS UNCERTAIN. PLEASE NOTIFY THE MICROBIOLOGY DEPARTMENT WITHIN ONE WEEK IF SPECIATION AND SENSITIVITIES ARE REQUIRED.    Report Status PENDING  Incomplete  Blood Culture ID Panel (Reflexed)     Status: Abnormal   Collection Time: 12/16/15  4:56 AM  Result Value Ref Range Status   Enterococcus species NOT DETECTED NOT DETECTED Final   Vancomycin resistance NOT DETECTED NOT DETECTED Final   Listeria monocytogenes NOT DETECTED NOT DETECTED Final   Staphylococcus species DETECTED (A) NOT DETECTED Final    Comment: CRITICAL RESULT CALLED TO, READ BACK BY AND VERIFIED WITH: V BRYK,PHARMD AT 0630 12/17/15 BY L BENFIELD    Staphylococcus aureus NOT DETECTED NOT DETECTED Final   Methicillin resistance NOT DETECTED NOT DETECTED Final   Streptococcus species NOT DETECTED NOT DETECTED Final   Streptococcus agalactiae NOT DETECTED NOT DETECTED Final   Streptococcus pneumoniae NOT DETECTED NOT DETECTED Final   Streptococcus pyogenes NOT DETECTED NOT DETECTED Final   Acinetobacter baumannii NOT DETECTED NOT DETECTED Final   Enterobacteriaceae species NOT DETECTED NOT DETECTED Final   Enterobacter cloacae  complex NOT DETECTED NOT DETECTED Final   Escherichia coli NOT DETECTED NOT DETECTED Final   Klebsiella oxytoca NOT DETECTED NOT DETECTED Final   Klebsiella pneumoniae NOT DETECTED NOT DETECTED Final   Proteus species NOT DETECTED NOT DETECTED Final   Serratia marcescens NOT DETECTED NOT DETECTED Final   Carbapenem resistance NOT DETECTED NOT DETECTED Final   Haemophilus influenzae NOT DETECTED NOT DETECTED Final   Neisseria meningitidis NOT DETECTED NOT DETECTED Final   Pseudomonas aeruginosa NOT DETECTED NOT DETECTED Final   Candida albicans NOT DETECTED NOT DETECTED Final   Candida glabrata NOT DETECTED NOT DETECTED Final   Candida krusei NOT DETECTED NOT DETECTED Final   Candida parapsilosis NOT DETECTED NOT DETECTED Final   Candida tropicalis NOT DETECTED NOT DETECTED Final  Culture, blood (routine x 2)  Status: None (Preliminary result)   Collection Time: 12/16/15  5:01 AM  Result Value Ref Range Status   Specimen Description BLOOD RIGHT FOREARM  Final   Special Requests BOTTLES DRAWN AEROBIC AND ANAEROBIC 5CC   Final   Culture NO GROWTH 1 DAY  Final   Report Status PENDING  Incomplete     Labs: Basic Metabolic Panel:  Recent Labs Lab 12/12/15 2248 12/12/15 2303 12/13/15 0234 12/14/15 0355 12/17/15 0518  NA 135 138 135 134* 137  K 3.9 4.4 3.8 3.6 3.8  CL 102 101 106 102 98*  CO2 22  --  21* 25 31  GLUCOSE 150* 147* 119* 106* 103*  BUN CREATININE 1.43* 1.20 1.11 0.93 0.94  CALCIUM 8.2*  --  8.2* 8.3* 8.9  MG  --   --  1.7  --  2.2  PHOS  --   --  2.5  --   --    Liver Function Tests:  Recent Labs Lab 12/12/15 2248 12/13/15 0234  AST 126* 114*  ALT 61 60  ALKPHOS 84 79  BILITOT 2.1* 1.4*  PROT 6.6 6.4*  ALBUMIN 3.2* 3.1*   No results for input(s): LIPASE, AMYLASE in the last 168 hours. No results for input(s): AMMONIA in the last 168 hours. CBC:  Recent Labs Lab 12/12/15 2248 12/12/15 2303 12/13/15 0234 12/14/15 0355  12/15/15 0233 12/16/15 0454  WBC 12.7*  --  10.5 10.3 8.1 7.8  NEUTROABS 9.8*  --   --   --   --   --   HGB 15.8 17.7* 15.3 14.3 14.0 13.6  HCT 49.0 52.0 46.9 44.9 43.9 42.7  MCV 93.3  --  91.1 93.2 92.4 92.0  PLT 128*  --  129* 133* 142* 150   Cardiac Enzymes:  Recent Labs Lab 12/13/15 0234 12/13/15 0855 12/13/15 1453  TROPONINI 0.38* 0.48* 0.36*   BNP: BNP (last 3 results)  Recent Labs  12/13/15 0234  BNP 235.6*    ProBNP (last 3 results) No results for input(s): PROBNP in the last 8760 hours.  CBG:  Recent Labs Lab 12/14/15 0412 12/14/15 0824 12/14/15 1214 12/14/15 1605 12/14/15 2004  GLUCAP 132* 110* 108* 96 119*       Signed:  Carolyne Littles, MD [facility] [phone] pager

## 2015-12-18 NOTE — Progress Notes (Signed)
Pt discharged home per MD order, home oxygen set up. All discharge instructions reviewed and all questions answered.

## 2015-12-19 ENCOUNTER — Encounter: Payer: Self-pay | Admitting: Pain Medicine

## 2015-12-19 DIAGNOSIS — E559 Vitamin D deficiency, unspecified: Secondary | ICD-10-CM | POA: Insufficient documentation

## 2015-12-19 LAB — FACTOR 5 LEIDEN

## 2015-12-19 LAB — CULTURE, BLOOD (ROUTINE X 2)

## 2015-12-19 NOTE — Progress Notes (Signed)

## 2015-12-19 NOTE — Progress Notes (Signed)
Quick Note:  NOTE: This forensic urine drug screen (UDS) test was conducted using a state-of-the-art ultra high performance liquid chromatography and mass spectrometry system (UPLC/MS-MS), the most sophisticated and accurate method available. UPLC/MS-MS is 1,000 times more precise and accurate than standard gas chromatography and mass spectrometry (GC/MS). This system can analyze 26 drug categories and 180 drug compounds.  The results of this UDS were read as unexpected due to the absence or presence of a parent drug or metabolite. This may represent a metabolic anomaly, rather than a compliance issue. ______ 

## 2015-12-20 ENCOUNTER — Other Ambulatory Visit: Payer: Self-pay

## 2015-12-20 ENCOUNTER — Emergency Department (HOSPITAL_COMMUNITY): Payer: Medicare Other

## 2015-12-20 ENCOUNTER — Emergency Department (HOSPITAL_COMMUNITY)
Admission: EM | Admit: 2015-12-20 | Discharge: 2015-12-20 | Disposition: A | Discharge status: Home / Self Care | Payer: Medicare Other | Attending: Emergency Medicine | Admitting: Emergency Medicine

## 2015-12-20 ENCOUNTER — Encounter (HOSPITAL_COMMUNITY): Payer: Self-pay | Admitting: Emergency Medicine

## 2015-12-20 DIAGNOSIS — I10 Essential (primary) hypertension: Secondary | ICD-10-CM

## 2015-12-20 DIAGNOSIS — R000 Tachycardia, unspecified: Secondary | ICD-10-CM

## 2015-12-20 DIAGNOSIS — Z7901 Long term (current) use of anticoagulants: Secondary | ICD-10-CM | POA: Insufficient documentation

## 2015-12-20 DIAGNOSIS — I2692 Saddle embolus of pulmonary artery without acute cor pulmonale: Secondary | ICD-10-CM | POA: Diagnosis present

## 2015-12-20 DIAGNOSIS — J9601 Acute respiratory failure with hypoxia: Secondary | ICD-10-CM | POA: Diagnosis not present

## 2015-12-20 DIAGNOSIS — R0902 Hypoxemia: Secondary | ICD-10-CM | POA: Diagnosis not present

## 2015-12-20 DIAGNOSIS — R042 Hemoptysis: Secondary | ICD-10-CM | POA: Diagnosis not present

## 2015-12-20 DIAGNOSIS — I2699 Other pulmonary embolism without acute cor pulmonale: Secondary | ICD-10-CM | POA: Insufficient documentation

## 2015-12-20 DIAGNOSIS — R05 Cough: Secondary | ICD-10-CM | POA: Diagnosis not present

## 2015-12-20 DIAGNOSIS — F411 Generalized anxiety disorder: Secondary | ICD-10-CM | POA: Diagnosis present

## 2015-12-20 HISTORY — DX: Other pulmonary embolism without acute cor pulmonale: I26.99

## 2015-12-20 HISTORY — DX: Tachycardia, unspecified: R00.0

## 2015-12-20 HISTORY — DX: Acute respiratory failure, unspecified whether with hypoxia or hypercapnia: J96.00

## 2015-12-20 HISTORY — DX: Hemoptysis: R04.2

## 2015-12-20 LAB — COMPREHENSIVE METABOLIC PANEL
ALBUMIN: 3 g/dL — AB (ref 3.5–5.0)
ALT: 22 U/L (ref 17–63)
ANION GAP: 8 (ref 5–15)
AST: 33 U/L (ref 15–41)
Alkaline Phosphatase: 58 U/L (ref 38–126)
BILIRUBIN TOTAL: 0.6 mg/dL (ref 0.3–1.2)
BUN: 11 mg/dL (ref 6–20)
CALCIUM: 8.9 mg/dL (ref 8.9–10.3)
CO2: 26 mmol/L (ref 22–32)
Chloride: 104 mmol/L (ref 101–111)
Creatinine, Ser: 0.86 mg/dL (ref 0.61–1.24)
Glucose, Bld: 111 mg/dL — ABNORMAL HIGH (ref 65–99)
POTASSIUM: 3.5 mmol/L (ref 3.5–5.1)
Sodium: 138 mmol/L (ref 135–145)
TOTAL PROTEIN: 7 g/dL (ref 6.5–8.1)

## 2015-12-20 LAB — PROTIME-INR
INR: 1.19 (ref 0.00–1.49)
PROTHROMBIN TIME: 15.3 s — AB (ref 11.6–15.2)

## 2015-12-20 LAB — CBC WITH DIFFERENTIAL/PLATELET
BASOS PCT: 1 %
Basophils Absolute: 0 10*3/uL (ref 0.0–0.1)
Eosinophils Absolute: 0.2 10*3/uL (ref 0.0–0.7)
Eosinophils Relative: 3 %
HEMATOCRIT: 46.4 % (ref 39.0–52.0)
Hemoglobin: 15.3 g/dL (ref 13.0–17.0)
LYMPHS ABS: 1.1 10*3/uL (ref 0.7–4.0)
Lymphocytes Relative: 18 %
MCH: 30.2 pg (ref 26.0–34.0)
MCHC: 33 g/dL (ref 30.0–36.0)
MCV: 91.7 fL (ref 78.0–100.0)
MONO ABS: 0.5 10*3/uL (ref 0.1–1.0)
MONOS PCT: 8 %
NEUTROS ABS: 4.4 10*3/uL (ref 1.7–7.7)
Neutrophils Relative %: 70 %
Platelets: 332 10*3/uL (ref 150–400)
RBC: 5.06 MIL/uL (ref 4.22–5.81)
RDW: 13.1 % (ref 11.5–15.5)
WBC: 6.3 10*3/uL (ref 4.0–10.5)

## 2015-12-20 LAB — APTT: APTT: 31 s (ref 24–37)

## 2015-12-20 NOTE — ED Notes (Signed)
Pt wheeled to waiting room to wait for sister to safely transport home. Pt did not have daily O2 tank with him during visit so patient is waiting in waiting room with O2 that he will switch to his own take prior to leaving which his sister is bringing. Pt is in NAD. VSS. A/o x4. Verbalizes understanding of discharge instructions.

## 2015-12-20 NOTE — Discharge Instructions (Signed)
Hemoptysis  Hemoptysis, which means coughing up blood, can be a sign of a minor problem or a serious medical condition. The blood that is coughed up may come from the lungs and airways. Coughed-up blood can also come from bleeding that occurs outside the lungs and airways. Blood can drain into the windpipe during a severe nosebleed or when blood is vomited from the stomach. Because hemoptysis can be a sign of something serious, a medical evaluation is required. For some people with hemoptysis, no definite cause is ever identified.  CAUSES   The most common cause of hemoptysis is bronchitis. Some other common causes include:   · A ruptured blood vessel caused by coughing or an infection.    · A medical condition that causes damage to the large air passageways (bronchiectasis).    · A blood clot in the lungs (pulmonary embolism).    · Pneumonia.    · Tuberculosis.    · Breathing in a small foreign object.    · Cancer.  For some people with hemoptysis, no definite cause is ever identified.    HOME CARE INSTRUCTIONS  · Only take over-the-counter or prescription medicines as directed by your caregiver. Do not use cough suppressants unless your caregiver approves.  · If your caregiver prescribes antibiotic medicines, take them as directed. Finish them even if you start to feel better.  · Do not smoke. Also avoid secondhand smoke.  · Follow up with your caregiver as directed.  SEEK IMMEDIATE MEDICAL CARE IF:   · You cough up bloody mucus for longer than a week.  · You have a blood-producing cough that is severe or getting worse.  · You have a blood-producing cough that comes and goes over time.  · You develop problems with your breathing.    · You vomit blood.  · You develop bloody or black-colored stools.  · You have chest pain.    · You develop night sweats.  · You feel faint or pass out.    · You have a fever or persistent symptoms for more than 2-3 days.    · You have a fever and your symptoms suddenly get worse.  MAKE  SURE YOU:  · Understand these instructions.  · Will watch your condition.  · Will get help right away if you are not doing well or get worse.     This information is not intended to replace advice given to you by your health care provider. Make sure you discuss any questions you have with your health care provider.     Document Released: 08/27/2001 Document Revised: 06/04/2012 Document Reviewed: 04/04/2012  Elsevier Interactive Patient Education ©2016 [employer]

## 2015-12-20 NOTE — ED Notes (Signed)
Pt ambulated to restroom from room. 

## 2015-12-20 NOTE — Consult Note (Signed)
Medical Consultation   Jared Bass  ZOX:[id number]  DOB: Feb 14, 1971  DOA: 12/20/2015  PCP: Sherrie Mustache, MD   Outpatient Specialists:    Requesting physician: [place]  Reason for consultation: admission   History of Present Illness: Jared Bass is an 45 y.o. male past medical history that includes hypertension, A. fib, discharged 2 days ago with diagnosis of saddle PE [facility] emergency department with chief complaint hemoptysis. [facility] hospitalists consulted for possible admission.  Information is obtained from the patient. He reports being discharged from the hospital 2 days ago after 6 day stay for acute hypoxic respiratory failure related to PE and acute renal failure. He went home on oxygen 3 L at rest to be titrated up with activity. He reports last night hemoptysis. He describes as blood-tinged sputum in small amounts. He denies any chest pain palpitations headache dizziness syncope or near-syncope. He denies any worsening shortness of breath fever chills. He verbalized care administration of her questions. Reports having a follow-up appointment with his primary care provider tomorrow at which time referral to hematology as expected. Denies any abdominal pain nausea vomiting dysuria hematuria frequency or urgency. He did verbalize supplies that his oxygen flow was supposed to be 3 L he has had it on 2 L since his discharge.   Review of Systems:  ROS As per HPI otherwise 10 point review of systems negative.   Past Medical History: Past Medical History  Diagnosis Date  . Chronic neck pain   . Right eye injury   . Hypertension   . History of tachycardia 05/04/2015  . History of head injury 05/04/2015  . DDD (degenerative disc disease), cervical 05/04/2015  . History of atrial fibrillation   . Pulmonary emboli (HCC)     on elequis  . Acute respiratory failure (HCC)     2017  . Hemoptysis     Past Surgical History: Past Surgical History  Procedure  Laterality Date  . Neck fusion      c5-7  . Tonsillectomy       Allergies:   Allergies  Allergen Reactions  . Tramadol Anaphylaxis and Rash  . Other Other (See Comments)    Rembrant Whitening strips  "fat lips"     Social History:  reports that he has never smoked. He does not have any smokeless tobacco history on file. He reports that he does not drink alcohol or use illicit drugs.   Family History: Family History  Problem Relation Age of Onset  . Hypertension Mother   . COPD Mother   . Mesothelioma Father   . COPD Father   . Peripheral vascular disease Brother     Clots in his arteries surgically removed     Physical Exam: Filed Vitals:   12/20/15 0841 12/20/15 0845 12/20/15 1053  BP: 161/92 119/86 114/78  Pulse: 98 105 99  Temp: 98.2 F (36.8 C)    TempSrc: Oral    Resp: 22 20 20   SpO2: 96% 96% 98%    Constitutional:  Alert and awake, oriented x3, not in any acute distress.Sitting up in bed watching TV Eyes: PERLA, EOMI, irises appear normal, anicteric sclera,  ENMT: external ears and nose appear normal,             Lips appears normal, oropharynx mucosa, tongue, posterior pharynx appear normal  Neck: neck appears normal, no masses, normal ROM, no thyromegaly, no JVD  CVS: S1-S2  clear, no murmur rubs or gallops, no LE edema, normal pedal pulses  Respiratory:  clear to auscultation bilaterally somewhat distant but no wheezing, rales or rhonchi. Respiratory effort normal. No accessory muscle use.  Abdomen: soft nontender, nondistended, normal bowel sounds, no hepatosplenomegaly, no hernias  Musculoskeletal: : no cyanosis, clubbing or edema noted bilaterally                       Neuro: Cranial nerves II-XII intact, strength, sensation, reflexes Psych: judgement and insight appear normal, stable mood and affect, mental status Skin: no rashes or lesions or ulcers, no induration or nodules    Data reviewed:  I have personally reviewed following labs and  imaging studies Labs:  CBC:  Recent Labs Lab 12/14/15 0355 12/15/15 0233 12/16/15 0454 12/20/15 0905  WBC 10.3 8.1 7.8 6.3  NEUTROABS  --   --   --  4.4  HGB 14.3 14.0 13.6 15.3  HCT 44.9 43.9 42.7 46.4  MCV 93.2 92.4 92.0 91.7  PLT 133* 142* 150 332    Basic Metabolic Panel:  Recent Labs Lab 12/14/15 0355 12/17/15 0518 12/20/15 0905  NA 134* 137 138  K 3.6 3.8 3.5  CL 102 98* 104  CO2 GLUCOSE 106* 103* 111*  BUN CREATININE 0.93 0.94 0.86  CALCIUM 8.3* 8.9 8.9  MG  --  2.2  --    GFR Estimated Creatinine Clearance: 120.3 mL/min (by C-G formula based on Cr of 0.86). Liver Function Tests:  Recent Labs Lab 12/20/15 0905  AST 33  ALT 22  ALKPHOS 58  BILITOT 0.6  PROT 7.0  ALBUMIN 3.0*   No results for input(s): LIPASE, AMYLASE in the last 168 hours. No results for input(s): AMMONIA in the last 168 hours. Coagulation profile  Recent Labs Lab 12/20/15 0905  INR 1.19    Cardiac Enzymes:  Recent Labs Lab 12/13/15 1453  TROPONINI 0.36*   BNP: Invalid input(s): POCBNP CBG:  Recent Labs Lab 12/14/15 0412 12/14/15 0824 12/14/15 1214 12/14/15 1605 12/14/15 2004  GLUCAP 132* 110* 108* 96 119*   D-Dimer No results for input(s): DDIMER in the last 72 hours. Hgb A1c No results for input(s): HGBA1C in the last 72 hours. Lipid Profile No results for input(s): CHOL, HDL, LDLCALC, TRIG, CHOLHDL, LDLDIRECT in the last 72 hours. Thyroid function studies No results for input(s): TSH, T4TOTAL, T3FREE, THYROIDAB in the last 72 hours.  Invalid input(s): FREET3 Anemia work up No results for input(s): VITAMINB12, FOLATE, FERRITIN, TIBC, IRON, RETICCTPCT in the last 72 hours. Urinalysis    Component Value Date/Time   COLORURINE YELLOW 12/16/2015 0400   APPEARANCEUR CLEAR 12/16/2015 0400   LABSPEC 1.012 12/16/2015 0400   PHURINE 7.5 12/16/2015 0400   GLUCOSEU NEGATIVE 12/16/2015 0400   HGBUR NEGATIVE 12/16/2015 0400   BILIRUBINUR  NEGATIVE 12/16/2015 0400   KETONESUR NEGATIVE 12/16/2015 0400   PROTEINUR NEGATIVE 12/16/2015 0400   UROBILINOGEN 1.0 02/01/2009 1447   NITRITE NEGATIVE 12/16/2015 0400   LEUKOCYTESUR NEGATIVE 12/16/2015 0400     Microbiology Recent Results (from the past 240 hour(s))  MRSA PCR Screening     Status: None   Collection Time: 12/13/15  4:46 AM  Result Value Ref Range Status   MRSA by PCR NEGATIVE NEGATIVE Final    Comment:        The GeneXpert MRSA Assay (FDA approved for NASAL specimens only), is one component of a comprehensive MRSA colonization surveillance  program. It is not intended to diagnose MRSA infection nor to guide or monitor treatment for MRSA infections.   Culture, blood (routine x 2)     Status: Abnormal   Collection Time: 12/16/15  4:56 AM  Result Value Ref Range Status   Specimen Description BLOOD RIGHT ANTECUBITAL  Final   Special Requests BOTTLES DRAWN AEROBIC AND ANAEROBIC 5CCEA  Final   Culture  Setup Time   Final    GRAM POSITIVE COCCI IN CLUSTERS AEROBIC BOTTLE ONLY CRITICAL RESULT CALLED TO, READ BACK BY AND VERIFIED WITH: V BRYK,PHARMD AT 0630 12/17/15 BY L BENFIELD    Culture (A)  Final    STAPHYLOCOCCUS SPECIES (COAGULASE NEGATIVE) THE SIGNIFICANCE OF ISOLATING THIS ORGANISM FROM A SINGLE SET OF BLOOD CULTURES WHEN MULTIPLE SETS ARE DRAWN IS UNCERTAIN. PLEASE NOTIFY THE MICROBIOLOGY DEPARTMENT WITHIN ONE WEEK IF SPECIATION AND SENSITIVITIES ARE REQUIRED.    Report Status 12/19/2015 FINAL  Final  Blood Culture ID Panel (Reflexed)     Status: Abnormal   Collection Time: 12/16/15  4:56 AM  Result Value Ref Range Status   Enterococcus species NOT DETECTED NOT DETECTED Final   Vancomycin resistance NOT DETECTED NOT DETECTED Final   Listeria monocytogenes NOT DETECTED NOT DETECTED Final   Staphylococcus species DETECTED (A) NOT DETECTED Final    Comment: CRITICAL RESULT CALLED TO, READ BACK BY AND VERIFIED WITH: V BRYK,PHARMD AT 0630 12/17/15 BY L  BENFIELD    Staphylococcus aureus NOT DETECTED NOT DETECTED Final   Methicillin resistance NOT DETECTED NOT DETECTED Final   Streptococcus species NOT DETECTED NOT DETECTED Final   Streptococcus agalactiae NOT DETECTED NOT DETECTED Final   Streptococcus pneumoniae NOT DETECTED NOT DETECTED Final   Streptococcus pyogenes NOT DETECTED NOT DETECTED Final   Acinetobacter baumannii NOT DETECTED NOT DETECTED Final   Enterobacteriaceae species NOT DETECTED NOT DETECTED Final   Enterobacter cloacae complex NOT DETECTED NOT DETECTED Final   Escherichia coli NOT DETECTED NOT DETECTED Final   Klebsiella oxytoca NOT DETECTED NOT DETECTED Final   Klebsiella pneumoniae NOT DETECTED NOT DETECTED Final   Proteus species NOT DETECTED NOT DETECTED Final   Serratia marcescens NOT DETECTED NOT DETECTED Final   Carbapenem resistance NOT DETECTED NOT DETECTED Final   Haemophilus influenzae NOT DETECTED NOT DETECTED Final   Neisseria meningitidis NOT DETECTED NOT DETECTED Final   Pseudomonas aeruginosa NOT DETECTED NOT DETECTED Final   Candida albicans NOT DETECTED NOT DETECTED Final   Candida glabrata NOT DETECTED NOT DETECTED Final   Candida krusei NOT DETECTED NOT DETECTED Final   Candida parapsilosis NOT DETECTED NOT DETECTED Final   Candida tropicalis NOT DETECTED NOT DETECTED Final  Culture, blood (routine x 2)     Status: None (Preliminary result)   Collection Time: 12/16/15  5:01 AM  Result Value Ref Range Status   Specimen Description BLOOD RIGHT FOREARM  Final   Special Requests BOTTLES DRAWN AEROBIC AND ANAEROBIC 5CC   Final   Culture NO GROWTH 3 DAYS  Final   Report Status PENDING  Incomplete       Inpatient Medications:   Scheduled Meds: Continuous Infusions:   Radiological Exams on Admission: Dg Chest 2 View  12/20/2015  CLINICAL DATA:  Hemoptysis. Patient's anticoagulation a history of pulmonary embolus. EXAM: CHEST  2 VIEW COMPARISON:  12/16/2015 FINDINGS: Cardiomediastinal  silhouette is normal. Mediastinal contours appear intact. There is no evidence of focal airspace consolidation, pleural effusion or pneumothorax. Osseous structures are without acute abnormality. Lower cervical fusion noted. Soft  tissues are grossly normal. IMPRESSION: No active cardiopulmonary disease. Electronically Signed   By: Ted Mcalpine M.D.   On: 12/20/2015 09:25    Impression/Recommendations Principal Problem:   Hemoptysis Active Problems:   Essential hypertension   Generalized anxiety disorder   Saddle pulmonary embolus (HCC)   Tachycardia  #1. Hemoptysis. Patient with recent saddle PE/evidence of right heart strain on CTA. Discharged 2 days ago on Eliquis. Describes the amount of hemoptysis as "blood-tinged" sputum. No episodes while in the emergency department. Hemoglobin stable at 15. Oxygen saturation level 96% on 3 L. Of note patient was discharged 2 days ago on oxygen prescribed at 3L/min at rest for acute respiratory failure related to PE. Patient verbalized accurate administration of eliquis. Patient has apt tomorrow with PCP for follow up and referral to heme -recommend discharge to home -keep follow up apt withy pcp  #2. Hypertension. Controlled in the emergency department.  #3. Saddle pulmonary embolus with evidence of heart strain on CTA. Patient recently hospitalized for same. hypercoagulabiltiy panel  Beta 2 glycoprotein/homocysteine/cardiolipin antibodies negative. Factor v leiden negative PT gene mutation pending.  -has follow up with PCP tomorrow will need referral to heme -Eliquis - keep follow up appointment -return to ED if symptoms fail to improve or worsen  4. Tachycardia. Mild. Heart rate 100 likely related to #3. -Follow-up with PCP tomorrow for monitoring  5. Acute respiratory failure with hypoxia related to #3. Patient was discharged on oxygen supplementation 3 L/m via nasal cannula at rest to be titrated up with activity. Target oxygen saturation  level 93% -Oxygen saturation level 96% at rest on 2 L in the emergency department -Continue oxygen supplementation -Monitor oxygen saturation level  wean oxygen as indicated  #6. Generalized anxiety disorder. Stable at baseline. -Patient verbalized comfort with discharge plan     Thank you for this consultation.  Our [facility] hospitalist team will follow the patient with you.   Time Spent: 30 minutes  Gwenyth Bender M.D. [facility] 12/20/2015, 10:55 AM

## 2015-12-20 NOTE — ED Provider Notes (Signed)
CSN: [id number]     Arrival date & time 12/20/15  0825 History   First MD Initiated Contact with Patient 12/20/15 0840     Chief Complaint  Patient presents with  . Hemoptysis     (Consider location/radiation/quality/duration/timing/severity/associated sxs/prior Treatment) HPI Patient was discharged 2 days ago for saddle PE. Started on a liquid's. States that yesterday he began coughing. States he had a tickle in his chest. He is noticed that the mucus is bloody. States he has done this throughout the night. Denies fever or chills. No increased shortness of breath. Patient is on 2 L of home O2 after being discharged from the hospital. Took his Eliquis at midnight last night. Denies lightheadedness or near syncope. Past Medical History  Diagnosis Date  . Chronic neck pain   . Right eye injury   . Hypertension   . History of tachycardia 05/04/2015  . History of head injury 05/04/2015  . DDD (degenerative disc disease), cervical 05/04/2015  . History of atrial fibrillation   . Pulmonary emboli (HCC)     on elequis  . Acute respiratory failure (HCC)     2017  . Hemoptysis    Past Surgical History  Procedure Laterality Date  . Neck fusion      c5-7  . Tonsillectomy     Family History  Problem Relation Age of Onset  . Hypertension Mother   . COPD Mother   . Mesothelioma Father   . COPD Father   . Peripheral vascular disease Brother     Clots in his arteries surgically removed   Social History  Substance Use Topics  . Smoking status: Never Smoker   . Smokeless tobacco: None  . Alcohol Use: No    Review of Systems  Constitutional: Negative for fever and chills.  HENT: Negative for nosebleeds.   Respiratory: Positive for cough. Negative for shortness of breath.   Cardiovascular: Negative for chest pain, palpitations and leg swelling.  Gastrointestinal: Negative for nausea, vomiting, abdominal pain, diarrhea and constipation.  Genitourinary: Negative for dysuria.   Musculoskeletal: Negative for myalgias, back pain, neck pain and neck stiffness.  Skin: Negative for rash and wound.  Neurological: Negative for dizziness, weakness, light-headedness, numbness and headaches.  All other systems reviewed and are negative.     Allergies  Tramadol and Other  Home Medications   Prior to Admission medications   Medication Sig Start Date End Date Taking? Authorizing Provider  apixaban (ELIQUIS) 5 MG TABS tablet Take 1 tablet (5 mg total) by mouth 2 (two) times daily. 12/22/15  Yes Drema Dallas, MD  gabapentin (NEURONTIN) 800 MG tablet Take 1 tablet (800 mg total) by mouth every 6 (six) hours. 11/30/15  Yes Delano Metz, MD  MAGNESIUM PO Take 1 tablet by mouth daily.   Yes Historical Provider, MD  oxyCODONE (OXY IR/ROXICODONE) 5 MG immediate release tablet Take 1 tablet (5 mg total) by mouth 5 (five) times daily as needed for severe pain. 11/30/15  Yes Delano Metz, MD  apixaban (ELIQUIS) 5 MG TABS tablet Take 2 tablets (10 mg total) by mouth 2 (two) times daily. Patient not taking: Reported on 12/20/2015 12/18/15   Drema Dallas, MD   BP 108/75 mmHg  Pulse 99  Temp(Src) 98.2 F (36.8 C) (Oral)  Resp 17  SpO2 99% Physical Exam  Constitutional: He is oriented to person, place, and time. He appears well-developed and well-nourished. No distress.  HENT:  Head: Normocephalic and atraumatic.  Mouth/Throat: Oropharynx is clear and  moist. No oropharyngeal exudate.  Eyes: EOM are normal. Pupils are equal, round, and reactive to light.  Neck: Normal range of motion. Neck supple.  Cardiovascular: Normal rate and regular rhythm.   Pulmonary/Chest: Effort normal and breath sounds normal. No respiratory distress. He has no wheezes. He has no rales. He exhibits no tenderness.  Decrease breath sounds bilateral bases left greater than right  Abdominal: Soft. Bowel sounds are normal. He exhibits no distension and no mass. There is no tenderness. There is no  rebound and no guarding.  Musculoskeletal: Normal range of motion. He exhibits no edema or tenderness.  No lower extremity swelling, asymmetry or tenderness. Distal pulses equal and intact.  Neurological: He is alert and oriented to person, place, and time.  Moves all extremities without deficit. Sensation is fully intact.  Skin: Skin is warm and dry. No rash noted. No erythema.  Psychiatric: He has a normal mood and affect. His behavior is normal.  Nursing note and vitals reviewed.   ED Course  Procedures (including critical care time) Labs Review Labs Reviewed  COMPREHENSIVE METABOLIC PANEL - Abnormal; Notable for the following:    Glucose, Bld 111 (*)    Albumin 3.0 (*)    All other components within normal limits  PROTIME-INR - Abnormal; Notable for the following:    Prothrombin Time 15.3 (*)    All other components within normal limits  CBC WITH DIFFERENTIAL/PLATELET  APTT    Imaging Review Dg Chest 2 View  12/20/2015  CLINICAL DATA:  Hemoptysis. Patient's anticoagulation a history of pulmonary embolus. EXAM: CHEST  2 VIEW COMPARISON:  12/16/2015 FINDINGS: Cardiomediastinal silhouette is normal. Mediastinal contours appear intact. There is no evidence of focal airspace consolidation, pleural effusion or pneumothorax. Osseous structures are without acute abnormality. Lower cervical fusion noted. Soft tissues are grossly normal. IMPRESSION: No active cardiopulmonary disease. Electronically Signed   By: Ted Mcalpine M.D.   On: 12/20/2015 09:25   I have personally reviewed and evaluated these images and lab results as part of my medical decision-making.   EKG Interpretation   Date/Time:  Tuesday December 20 2015 08:38:13 EDT Ventricular Rate:  104 PR Interval:    QRS Duration: 96 QT Interval:  373 QTC Calculation: 491 R Axis:   94 Text Interpretation:  Sinus tachycardia Low voltage with right axis  deviation Abnormal T, consider ischemia, anterior leads Baseline wander  in  lead(s) II Rate is slower Confirmed by MOLPUS  MD, Jonny Ruiz ([id number]) on  12/21/2015 11:17:14 AM      MDM   Final diagnoses:  Cough with hemoptysis   Hemoglobin is stable. Patient is maintain saturations on 2 L of oxygen. No further hemoptysis in the emergency department. Evaluated by [facility]. Cleared to go home and follow-up with primary physician tomorrow. Return precautions given.     Loren Racer, MD 12/21/15 [phone]

## 2015-12-20 NOTE — ED Notes (Signed)
Pt to ER BIB [facility] with complaint of hemoptysis onset yesterday at 1 pm. Pt reports diagnosis of saddle PE last week 12/12/15 with hospitalization. Taking eliquis as instructed. Pt reports he is coughing up a lot of blood since midnight last night, attempted to get in touch with family doctor but did not receive a response. Has been on O2 since diagnosis of PE at 2L, EMS increased to 4L due to low SpO2. Pt ambulatory to stretcher. NAD. Denies pain or shortness of breath.

## 2015-12-21 LAB — CULTURE, BLOOD (ROUTINE X 2): Culture: NO GROWTH

## 2015-12-22 DIAGNOSIS — K760 Fatty (change of) liver, not elsewhere classified: Secondary | ICD-10-CM | POA: Diagnosis not present

## 2015-12-22 DIAGNOSIS — N289 Disorder of kidney and ureter, unspecified: Secondary | ICD-10-CM | POA: Diagnosis not present

## 2015-12-22 DIAGNOSIS — I501 Left ventricular failure: Secondary | ICD-10-CM | POA: Diagnosis not present

## 2015-12-22 DIAGNOSIS — K921 Melena: Secondary | ICD-10-CM | POA: Diagnosis not present

## 2015-12-22 LAB — PROTHROMBIN GENE MUTATION

## 2015-12-28 ENCOUNTER — Telehealth: Payer: Self-pay | Admitting: General Surgery

## 2015-12-28 NOTE — Telephone Encounter (Signed)
Left voice message for patient to call and reschedule appointment with Dr. Servando Snare for blood in stool from Dr. Dario Guardian

## 2015-12-29 NOTE — Telephone Encounter (Signed)
Attempted to call patient to schedule appointment. Left a voice message for him to call us.

## 2016-01-04 ENCOUNTER — Inpatient Hospital Stay: Payer: Medicare Other | Admitting: Oncology

## 2016-01-16 DIAGNOSIS — I2699 Other pulmonary embolism without acute cor pulmonale: Secondary | ICD-10-CM | POA: Diagnosis not present

## 2016-01-16 DIAGNOSIS — K921 Melena: Secondary | ICD-10-CM | POA: Diagnosis not present

## 2016-01-16 DIAGNOSIS — E559 Vitamin D deficiency, unspecified: Secondary | ICD-10-CM | POA: Diagnosis not present

## 2016-01-16 DIAGNOSIS — E781 Pure hyperglyceridemia: Secondary | ICD-10-CM | POA: Diagnosis not present

## 2016-01-23 ENCOUNTER — Inpatient Hospital Stay: Payer: Medicare Other | Admitting: Oncology

## 2016-02-13 ENCOUNTER — Encounter: Payer: Self-pay | Admitting: Pain Medicine

## 2016-02-13 ENCOUNTER — Ambulatory Visit: Payer: Medicare Other | Attending: Pain Medicine | Admitting: Pain Medicine

## 2016-02-13 VITALS — BP 148/105 | HR 88 | Temp 98.0°F | Resp 18 | Ht 68.0 in | Wt 217.0 lb

## 2016-02-13 DIAGNOSIS — Z981 Arthrodesis status: Secondary | ICD-10-CM | POA: Insufficient documentation

## 2016-02-13 DIAGNOSIS — E785 Hyperlipidemia, unspecified: Secondary | ICD-10-CM | POA: Diagnosis not present

## 2016-02-13 DIAGNOSIS — S022XXA Fracture of nasal bones, initial encounter for closed fracture: Secondary | ICD-10-CM | POA: Insufficient documentation

## 2016-02-13 DIAGNOSIS — F1190 Opioid use, unspecified, uncomplicated: Secondary | ICD-10-CM

## 2016-02-13 DIAGNOSIS — Z86711 Personal history of pulmonary embolism: Secondary | ICD-10-CM | POA: Diagnosis not present

## 2016-02-13 DIAGNOSIS — M79609 Pain in unspecified limb: Secondary | ICD-10-CM

## 2016-02-13 DIAGNOSIS — M4802 Spinal stenosis, cervical region: Secondary | ICD-10-CM | POA: Diagnosis not present

## 2016-02-13 DIAGNOSIS — X58XXXA Exposure to other specified factors, initial encounter: Secondary | ICD-10-CM | POA: Insufficient documentation

## 2016-02-13 DIAGNOSIS — K5903 Drug induced constipation: Secondary | ICD-10-CM | POA: Diagnosis not present

## 2016-02-13 DIAGNOSIS — M62838 Other muscle spasm: Secondary | ICD-10-CM | POA: Diagnosis not present

## 2016-02-13 DIAGNOSIS — F411 Generalized anxiety disorder: Secondary | ICD-10-CM | POA: Insufficient documentation

## 2016-02-13 DIAGNOSIS — G8929 Other chronic pain: Secondary | ICD-10-CM | POA: Diagnosis not present

## 2016-02-13 DIAGNOSIS — M542 Cervicalgia: Secondary | ICD-10-CM | POA: Diagnosis not present

## 2016-02-13 DIAGNOSIS — Z79891 Long term (current) use of opiate analgesic: Secondary | ICD-10-CM | POA: Insufficient documentation

## 2016-02-13 DIAGNOSIS — I10 Essential (primary) hypertension: Secondary | ICD-10-CM | POA: Diagnosis not present

## 2016-02-13 DIAGNOSIS — Z7901 Long term (current) use of anticoagulants: Secondary | ICD-10-CM | POA: Diagnosis not present

## 2016-02-13 DIAGNOSIS — M79601 Pain in right arm: Secondary | ICD-10-CM | POA: Diagnosis present

## 2016-02-13 DIAGNOSIS — M79603 Pain in arm, unspecified: Secondary | ICD-10-CM | POA: Insufficient documentation

## 2016-02-13 DIAGNOSIS — M25511 Pain in right shoulder: Secondary | ICD-10-CM | POA: Insufficient documentation

## 2016-02-13 DIAGNOSIS — M4722 Other spondylosis with radiculopathy, cervical region: Secondary | ICD-10-CM | POA: Diagnosis not present

## 2016-02-13 DIAGNOSIS — N179 Acute kidney failure, unspecified: Secondary | ICD-10-CM | POA: Diagnosis not present

## 2016-02-13 DIAGNOSIS — M5412 Radiculopathy, cervical region: Secondary | ICD-10-CM | POA: Insufficient documentation

## 2016-02-13 DIAGNOSIS — H3530 Unspecified macular degeneration: Secondary | ICD-10-CM | POA: Insufficient documentation

## 2016-02-13 DIAGNOSIS — E559 Vitamin D deficiency, unspecified: Secondary | ICD-10-CM | POA: Diagnosis not present

## 2016-02-13 DIAGNOSIS — M961 Postlaminectomy syndrome, not elsewhere classified: Secondary | ICD-10-CM

## 2016-02-13 MED ORDER — OXYCODONE HCL 5 MG PO TABS: 5.0000 mg | ORAL_TABLET | Freq: Every day | ORAL | 0 refills | Status: DC | PRN

## 2016-02-13 NOTE — Progress Notes (Signed)
Patient's Name: Jared Bass  Patient type: Established  MRN: [id number]  Service setting: Ambulatory outpatient  DOB: [birth year]  Location: [facility]  DOS: 02/13/2016  Primary Care Physician: Sherrie MustacheFayegh Jadali, MD  Note by: Sydnee LevansFrancisco A. Laban EmperorNaveira, M.D  Referring Physician: Sherrie MustacheJadali, Fayegh, MD  Specialty: Interventional Pain Management  Last Visit to Pain Management: 11/30/2015   Primary Reason(s) for Visit: Encounter for prescription drug management (Level of risk: moderate) CC: Neck Pain (right) and Arm Pain (right)   HPI  Jared Bass is a 45 y.o. year old, male patient, who returns today as an established patient. 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); Long term current use of opiate analgesic; Long term prescription opiate use; Opiate use (37.5 MME/Day); Opiate dependence (HCC); Encounter for therapeutic drug level monitoring; Neurogenic pain; Chronic cervical radicular pain (Location of Primary Source of Pain) (Bilateral) (R>L) (C6 and C7 Dermatome); Chronic shoulder pain (Right); Chronic elbow pain (Right); Cervical spinal stenosis; Cervical foraminal stenosis (right side); Myofascial pain; Muscle spasms of neck; History of tachycardia; Essential hypertension; History of head injury; Generalized anxiety disorder; Hyperlipidemia; Chronic upper extremity pain (Location of Primary Source of Pain) (Bilateral) (R>L); Radicular pain of shoulder (Right); Opioid-induced constipation; Saddle pulmonary embolus (HCC); Acute respiratory failure with hypoxia (HCC); Pulmonary hypertension (HCC); Acute renal failure (HCC); Closed fracture of nasal bone; Faintness; Acute saddle pulmonary embolism with acute cor pulmonale (HCC); Hypotension; Syncope and collapse; Vitamin D insufficiency; Pulmonary emboli (HCC); Hemoptysis;  Tachycardia; and Pulmonary embolism (HCC) on his problem list.. His primarily concern today is the Neck Pain (right) and Arm Pain (right)   Pain Assessment: Self-Reported Pain Score: 4              Reported level is compatible with observation       Pain Type: Chronic pain Pain Location: Neck Pain Orientation: Right Pain Descriptors / Indicators: Burning, Pressure, Aching, Dull (stinging) Pain Frequency: Constant  The patient comes into the clinics today for pharmacological management of his chronic pain. I last saw this patient on 11/30/2015. The patient  reports that he does not use drugs. His body mass index is 32.99 kg/m.  Date of Last Visit: 11/30/15 Service Provided on Last Visit: Med Refill  Controlled Substance Pharmacotherapy Assessment & REMS (Risk Evaluation and Mitigation Strategy)  Analgesic: Oxycodone IR 5 mg 5 times a day (25 mg/day of oxycodone) MME/day: 37.5 mg/day Pill Count: Bottle labeled oxycodone 5 mg # 41/150  Filled 01-30-16 (At 4/day it should last until 02/21/16) Pharmacokinetics: Onset of action (Liberation/Absorption): Within expected pharmacological parameters Time to Peak effect (Distribution): Timing and results are as within normal expected parameters Duration of action (Metabolism/Excretion): Within normal limits for medication Pharmacodynamics: Analgesic Effect: More than 50% Activity Facilitation: Medication(s) allow patient to sit, stand, walk, and do the basic ADLs Perceived Effectiveness: Described as relatively effective, allowing for increase in activities of daily living (ADL) Side-effects or Adverse reactions: None reported Monitoring: [place] PMP: Online review of the past 9124-month period conducted. Compliant with practice rules and regulations Last UDS on record: [employer]ox[employer] Select 13  Date Value Ref Range Status  11/30/2015 FINAL  Final    Comment:    ==================================================================== TOXASSURE SELECT 13  (MW) ==================================================================== Test  Result       Flag       Units Drug Present and Declared for Prescription Verification   Noroxycodone                   214          EXPECTED   ng/mg creat    Noroxycodone is an expected metabolite of oxycodone. Sources of    oxycodone include scheduled prescription medications. Drug Absent but Declared for Prescription Verification   Oxycodone                      Not Detected UNEXPECTED ng/mg creat    Oxycodone is almost always present in patients taking this drug    consistently.  Absence of oxycodone could be due to lapse of time    since the last dose or unusual pharmacokinetics (rapid    metabolism). ==================================================================== Test                      Result    Flag   Units      Ref Range   Creatinine              155              mg/dL      >=96 ==================================================================== Declared Medications:  The flagging and interpretation on this report are based on the  following declared medications.  Unexpected results may arise from  inaccuracies in the declared medications.  **Note: The testing scope of this panel includes these medications:  Oxycodone  **Note: The testing scope of this panel does not include following  reported medications:  Gabapentin  Propranolol  Tizanidine ==================================================================== For clinical consultation, please call [phone]. ====================================================================    UDS interpretation: Compliant          Medication Assessment Form: Reviewed. Patient indicates being compliant with therapy Treatment compliance: Compliant Risk Assessment: Aberrant Behavior: None observed today Substance Use Disorder (SUD) Risk Level: No change since last visit Risk of opioid abuse or dependence: 0.7-3.0%  with doses ? 36 MME/day and 6.1-26% with doses ? 120 MME/day. Opioid Risk Tool (ORT) Score: Total Score: 2 Low Risk for SUD (Score <3) Depression Scale Score: PHQ-2: PHQ-2 Total Score: 0 No depression (0) PHQ-9: PHQ-9 Total Score: 0 No depression (0-4)  Pharmacologic Plan: No change in therapy, at this time  Laboratory Chemistry  Inflammation Markers Lab Results  Component Value Date   ESRSEDRATE 4 11/30/2015   CRP 0.6 11/30/2015    Renal Function Lab Results  Component Value Date   BUN 11 12/20/2015   CREATININE 0.86 12/20/2015   GFRAA >60 12/20/2015   GFRNONAA >60 12/20/2015    Hepatic Function Lab Results  Component Value Date   AST 33 12/20/2015   ALT 22 12/20/2015   ALBUMIN 3.0 (L) 12/20/2015    Electrolytes Lab Results  Component Value Date   NA 138 12/20/2015   K 3.5 12/20/2015   CL 104 12/20/2015   CALCIUM 8.9 12/20/2015   MG 2.2 12/17/2015    Pain Modulating Vitamins Lab Results  Component Value Date   VD25OH 22.4 (L) 11/30/2015    Coagulation Parameters Lab Results  Component Value Date   INR 1.19 12/20/2015   LABPROT 15.3 (H) 12/20/2015   APTT 31 12/20/2015   PLT 332 12/20/2015    Cardiovascular Lab Results  Component Value Date   BNP 235.6 (H) 12/13/2015   HGB 15.3  12/20/2015   HCT 46.4 12/20/2015    Note: Lab results reviewed.  Recent Diagnostic Imaging  Dg Chest 2 View  Result Date: 12/20/2015 CLINICAL DATA:  Hemoptysis. Patient's anticoagulation a history of pulmonary embolus. EXAM: CHEST  2 VIEW COMPARISON:  12/16/2015 FINDINGS: Cardiomediastinal silhouette is normal. Mediastinal contours appear intact. There is no evidence of focal airspace consolidation, pleural effusion or pneumothorax. Osseous structures are without acute abnormality. Lower cervical fusion noted. Soft tissues are grossly normal. IMPRESSION: No active cardiopulmonary disease. Electronically Signed   By: Ted Mcalpine M.D.   On: 12/20/2015 09:25    Meds   The patient has a current medication list which includes the following prescription(s): apixaban, gabapentin, magnesium, oxycodone, oxycodone, oxycodone, propranolol, and tizanidine.  Current Outpatient Prescriptions on File Prior to Visit  Medication Sig  . apixaban (ELIQUIS) 5 MG TABS tablet Take 2 tablets (10 mg total) by mouth 2 (two) times daily.  Marland Kitchen gabapentin (NEURONTIN) 800 MG tablet Take 1 tablet (800 mg total) by mouth every 6 (six) hours.  Marland Kitchen MAGNESIUM PO Take 1 tablet by mouth daily.   No current facility-administered medications on file prior to visit.     ROS  Constitutional: Denies any fever or chills Gastrointestinal: No reported hemesis, hematochezia, vomiting, or acute GI distress Musculoskeletal: Denies any acute onset joint swelling, redness, loss of ROM, or weakness Neurological: No reported episodes of acute onset apraxia, aphasia, dysarthria, agnosia, amnesia, paralysis, loss of coordination, or loss of consciousness  Allergies  Jared Bass is allergic to tramadol and other.  PFSH  Medical:  Jared Bass  has a past medical history of Acute respiratory failure (HCC); Chronic neck pain; DDD (degenerative disc disease), cervical (05/04/2015); Hemoptysis; History of atrial fibrillation; History of head injury (05/04/2015); History of tachycardia (05/04/2015); Hypertension; Pulmonary emboli (HCC); and Right eye injury. Family: family history includes COPD in his father and mother; Hypertension in his mother; Mesothelioma in his father; Peripheral vascular disease in his brother. Surgical:  has a past surgical history that includes neck fusion and Tonsillectomy. Tobacco:  reports that he has never smoked. He has never used smokeless tobacco. Alcohol:  reports that he does not drink alcohol. Drug:  reports that he does not use drugs.  Constitutional Exam  Vitals: Blood pressure (!) 148/105, pulse 88, temperature 98 F (36.7 C), resp. rate 18, height 5\' 8"  (1.727 m), weight 217  lb (98.4 kg), SpO2 97 %. General appearance: Well nourished, well developed, and well hydrated. In no acute distress Calculated BMI/Body habitus: Body mass index is 32.99 kg/m. (30-34.9 kg/m2) Obese (Class I) - 68% higher incidence of chronic pain Psych/Mental status: Alert and oriented x 3 (person, place, & time) Eyes: PERLA Respiratory: No evidence of acute respiratory distress  Cervical Spine Exam  Inspection: No masses, redness, or swelling Alignment: Symmetrical Functional ROM: ROM appears unrestricted Stability: No instability detected Muscle strength & Tone: Functionally intact Sensory: Unimpaired Palpation: Non-contributory  Upper Extremity (UE) Exam    Side: Right upper extremity  Side: Left upper extremity  Inspection: No masses, redness, swelling, or asymmetry  Inspection: No masses, redness, swelling, or asymmetry  Functional ROM: ROM appears unrestricted  Functional ROM: ROM appears unrestricted  Muscle strength & Tone: Functionally intact  Muscle strength & Tone: Functionally intact  Sensory: Unimpaired  Sensory: Unimpaired  Palpation: Non-contributory  Palpation: Non-contributory   Thoracic Spine Exam  Inspection: No masses, redness, or swelling Alignment: Symmetrical Functional ROM: ROM appears unrestricted Stability: No instability detected Sensory: Unimpaired Muscle strength &  Tone: Functionally intact Palpation: Non-contributory  Lumbar Spine Exam  Inspection: No masses, redness, or swelling Alignment: Symmetrical Functional ROM: ROM appears unrestricted Stability: No instability detected Muscle strength & Tone: Functionally intact Sensory: Unimpaired Palpation: Non-contributory Provocative Tests: Lumbar Hyperextension and rotation test: evaluation deferred today       Patrick's Maneuver: evaluation deferred today              Gait & Posture Assessment  Ambulation: Unassisted Gait: Relatively normal for age and body habitus Posture: WNL   Lower  Extremity Exam    Side: Right lower extremity  Side: Left lower extremity  Inspection: No masses, redness, swelling, or asymmetry  Inspection: No masses, redness, swelling, or asymmetry  Functional ROM: ROM appears unrestricted  Functional ROM: ROM appears unrestricted  Muscle strength & Tone: Functionally intact  Muscle strength & Tone: Functionally intact  Sensory: Unimpaired  Sensory: Unimpaired  Palpation: Non-contributory  Palpation: Non-contributory    Assessment & Plan  Primary Diagnosis & Pertinent Problem List: The primary encounter diagnosis was Chronic pain. 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  Visit Diagnosis: 1. Chronic pain   2. Long term current use of opiate analgesic   3. Opiate use (37.5 MME/Day)   4. Chronic cervical radicular pain (Location of Primary Source of Pain) (Bilateral) (R>L) (C6 and C7 Dermatome)   5. Chronic neck pain (Location of Secondary source of pain) (Bilateral) (R>L)   6. Chronic upper extremity pain, unspecified laterality   7. 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  Problem-specific Plan(s): No problem-specific Assessment & Plan notes found for this encounter.  No new Assessment & Plan notes have been filed under this hospital service since the last note was generated. Service: Pain Management   Plan of Care   Problem List Items Addressed This Visit      High   Chronic cervical radicular pain (Location of Primary Source of Pain) (Bilateral) (R>L) (C6 and C7 Dermatome) (Chronic)   Relevant  Medications   tiZANidine (ZANAFLEX) 2 MG tablet   Chronic neck pain (Location of Secondary source of pain) (Bilateral) (R>L) (Chronic)   Relevant Medications   tiZANidine (ZANAFLEX) 2 MG tablet   oxyCODONE (OXY IR/ROXICODONE) 5 MG immediate release tablet   oxyCODONE (OXY IR/ROXICODONE) 5 MG immediate release tablet   oxyCODONE (OXY IR/ROXICODONE) 5 MG immediate release tablet   Chronic pain - Primary (Chronic)   Relevant Medications   tiZANidine (ZANAFLEX) 2 MG tablet   oxyCODONE (OXY IR/ROXICODONE) 5 MG immediate release tablet   oxyCODONE (OXY IR/ROXICODONE) 5 MG immediate release tablet   oxyCODONE (OXY IR/ROXICODONE) 5 MG immediate release tablet   Chronic upper extremity pain (Location of Primary Source of Pain) (Bilateral) (R>L) (Chronic)   Relevant Medications   tiZANidine (ZANAFLEX) 2 MG tablet   oxyCODONE (OXY IR/ROXICODONE) 5 MG immediate release tablet   oxyCODONE (OXY IR/ROXICODONE) 5 MG immediate release tablet   oxyCODONE (OXY IR/ROXICODONE) 5 MG immediate release tablet   Failed cervical surgery 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      Pharmacotherapy (Medications Ordered): Meds ordered this encounter  Medications  . oxyCODONE (OXY IR/ROXICODONE) 5 MG immediate release tablet    Sig: Take 1 tablet (5 mg total) by mouth 5 (five) times daily as needed for severe pain.    Dispense:  150 tablet    Refill:  0    Do not place this medication, or any other prescription from our practice, on "Automatic Refill". Patient may have prescription filled one day early if pharmacy is closed on scheduled refill date. Do not fill until: 02/28/16 To last until: 03/29/16  . oxyCODONE (OXY IR/ROXICODONE) 5 MG immediate release tablet    Sig: Take 1 tablet (5 mg total) by mouth 5 (five) times daily as needed for severe pain.    Dispense:  150  tablet    Refill:  0    Do not place this medication, or any other prescription from our practice, on "Automatic Refill". Patient may have prescription filled one day early if pharmacy is closed on scheduled refill date. Do not fill until: 03/29/16 To last until: 04/28/16  . oxyCODONE (OXY IR/ROXICODONE) 5 MG immediate release tablet    Sig: Take 1 tablet (5 mg total) by mouth 5 (five) times daily as needed for severe pain.    Dispense:  150 tablet    Refill:  0    Do not place this medication, or any other prescription from our practice, on "Automatic Refill". Patient may have prescription filled one day early if pharmacy is closed on scheduled refill date. Do not fill until: 04/28/16 To last until: 05/28/16    [facility] & Procedure Ordered: No orders of the defined types were placed in this encounter.   Imaging Ordered: None  Interventional Therapies: Scheduled:  None at this time.    Considering:  1. Diagnostic bilateral cervical facet block under fluoroscopic guidance and IV sedation. 2. Possible cervical facet radiofrequency ablation. 3. Palliative right-sided cervical epidural steroid injection under fluoroscopic guidance, with or without sedation. 4. Diagnostic right intra-articular shoulder injection under fluoroscopic guidance, with or without sedation. 5. Diagnostic right suprascapular nerve block under fluoroscopic guidance, with or without sedation. 6. Possible right suprascapular radiofrequency ablation.    PRN Procedures:  1. Diagnostic bilateral cervical facet block under fluoroscopic guidance and IV sedation. 2. Palliative right-sided cervical epidural steroid injection under fluoroscopic guidance, with or without sedation. 3. Diagnostic right intra-articular shoulder injection under fluoroscopic guidance, with or without sedation. 4. Diagnostic right suprascapular nerve block under fluoroscopic guidance, with or without sedation.    Referral(s) or Consult(s):  None at this time.  New Prescriptions   No medications on file    Medications administered during this visit: Jared Bass had no medications administered during this visit.  Requested PM Follow-up: Return in 3 months (on 05/09/2016) for Med-Mgmt.  Future Appointments Date Time Provider Department Center  02/16/2016 2:00 PM Jeralyn Ruths, MD [facility] None  02/23/2016 2:30 PM Midge Minium, MD [facility] None  05/09/2016 1:40 PM Delano Metz, MD [facility] None    Primary Care Physician: Sherrie Mustache, MD Location: [facility] Note by: Sydnee Levans. Laban Emperor, M.D, DABA, DABAPM, DABPM, DABIPP, FIPP  Pain Score Disclaimer: We use the NRS-11 scale. This is a self-reported, subjective measurement of pain severity with only modest accuracy. It is used primarily to identify changes within a particular patient. It must be understood that outpatient pain  scales are significantly less accurate that those used for research, where they can be applied under ideal controlled circumstances with minimal exposure to variables. In reality, the score is likely to be a combination of pain intensity and pain affect, where pain affect describes the degree of emotional arousal or changes in action readiness caused by the sensory experience of pain. Factors such as social and work situation, setting, emotional state, anxiety levels, expectation, and prior pain experience may influence pain perception and show large inter-individual differences that may also be affected by time variables.  Patient instructions provided during this appointment: There are no Patient Instructions on file for this visit.

## 2016-02-13 NOTE — Progress Notes (Signed)
Safety precautions to be maintained throughout the outpatient stay will include: orient to surroundings, keep bed in low position, maintain call bell within reach at all times, provide assistance with transfer out of bed and ambulation.  Patient passed out and was takent o hospital at [facility] and was diagnosed with multiple Pulmonary emboli.  Was admitted for a week.  Discharged on Eliquis. Bottle labeled oxycodone 5 mg # 41/150  Filled 01-30-16

## 2016-02-15 ENCOUNTER — Telehealth: Payer: Self-pay | Admitting: Pain Medicine

## 2016-02-15 ENCOUNTER — Other Ambulatory Visit: Payer: Self-pay | Admitting: Pain Medicine

## 2016-02-15 DIAGNOSIS — T402X5A Adverse effect of other opioids, initial encounter: Principal | ICD-10-CM

## 2016-02-15 DIAGNOSIS — K5903 Drug induced constipation: Secondary | ICD-10-CM

## 2016-02-15 MED ORDER — DOCUSATE SODIUM 100 MG PO CAPS: 200.0000 mg | ORAL_CAPSULE | Freq: Every evening | ORAL | 99 refills | Status: DC | PRN

## 2016-02-15 MED ORDER — BENEFIBER PO POWD: ORAL | 99 refills | Status: DC

## 2016-02-15 MED ORDER — BISACODYL 5 MG PO TBEC: 10.0000 mg | DELAYED_RELEASE_TABLET | Freq: Every evening | ORAL | 99 refills | Status: DC | PRN

## 2016-02-15 NOTE — Telephone Encounter (Signed)
Patient states Dr. Laban Emperor was going to escribe some Benefiber, pharmacy does not have it, please call patient to let him know status

## 2016-02-15 NOTE — Telephone Encounter (Signed)
Message left with person who answered the phone that prescriptions have been e-scribed to pharmacy.

## 2016-02-16 ENCOUNTER — Inpatient Hospital Stay: Payer: Medicare Other | Admitting: Oncology

## 2016-02-23 ENCOUNTER — Ambulatory Visit: Payer: Self-pay | Admitting: Gastroenterology

## 2016-03-14 ENCOUNTER — Encounter (INDEPENDENT_AMBULATORY_CARE_PROVIDER_SITE_OTHER): Payer: Self-pay

## 2016-03-14 ENCOUNTER — Inpatient Hospital Stay: Payer: Medicare Other

## 2016-03-14 ENCOUNTER — Other Ambulatory Visit: Payer: Self-pay

## 2016-03-14 ENCOUNTER — Inpatient Hospital Stay: Payer: Medicare Other | Attending: Oncology | Admitting: Oncology

## 2016-03-14 VITALS — BP 136/89 | HR 87 | Temp 98.0°F | Ht 68.0 in | Wt 217.0 lb

## 2016-03-14 DIAGNOSIS — I4891 Unspecified atrial fibrillation: Secondary | ICD-10-CM | POA: Insufficient documentation

## 2016-03-14 DIAGNOSIS — I10 Essential (primary) hypertension: Secondary | ICD-10-CM | POA: Insufficient documentation

## 2016-03-14 DIAGNOSIS — R000 Tachycardia, unspecified: Secondary | ICD-10-CM | POA: Insufficient documentation

## 2016-03-14 DIAGNOSIS — Z7901 Long term (current) use of anticoagulants: Secondary | ICD-10-CM | POA: Diagnosis not present

## 2016-03-14 DIAGNOSIS — M5030 Other cervical disc degeneration, unspecified cervical region: Secondary | ICD-10-CM | POA: Diagnosis not present

## 2016-03-14 DIAGNOSIS — Z79899 Other long term (current) drug therapy: Secondary | ICD-10-CM | POA: Insufficient documentation

## 2016-03-14 DIAGNOSIS — I2692 Saddle embolus of pulmonary artery without acute cor pulmonale: Secondary | ICD-10-CM | POA: Diagnosis not present

## 2016-03-14 LAB — ANTITHROMBIN III: ANTITHROMB III FUNC: 85 % (ref 75–120)

## 2016-03-14 NOTE — Progress Notes (Signed)
Patient here as a referral for blood clots in lungs. Patient was in the hospital 2 months ago.

## 2016-03-14 NOTE — Progress Notes (Signed)
[facility]  Telephone:([phone] [phone]  ID: Jared Bass: 03-Nov-1970  MR#: [id number]  ZHG#:[id number]  Patient Care Team: Sherrie Mustache, MD as PCP - General (Internal Medicine)  CHIEF COMPLAINT: Saddle pulmonary embolism  INTERVAL HISTORY: Patient is a 45 year old male who was admitted to the hospital in June 2017 with worsening shortness of breath and pain. Subsequent workup included CT scan which revealed a large saddle pulmonary embolism. Patient was placed on Eliquis at that time. He is referred to clinic for further evaluation and discussion of length of anticoagulation. Patient has chronic pain and is on disability. He has no neurologic complaints. He denies any recent fevers or illnesses. He has a good appetite and denies weight loss. He has no chest pain or shortness of breath. He denies any nausea, vomiting, constipation, or diarrhea. He has no easy bleeding or bruising. He has no urinary complaints. Patient feels at his baseline and offers no further specific complaints.   REVIEW OF SYSTEMS:   Review of Systems  Constitutional: Negative.  Negative for fever, malaise/fatigue and weight loss.  Respiratory: Negative.  Negative for cough and shortness of breath.   Cardiovascular: Negative.  Negative for chest pain.  Gastrointestinal: Negative.  Negative for abdominal pain, blood in stool and melena.  Musculoskeletal: Positive for back pain, joint pain and neck pain.  Neurological: Negative.  Negative for weakness.  Endo/Heme/Allergies: Does not bruise/bleed easily.  Psychiatric/Behavioral: Negative.  The patient is not nervous/anxious.     As per HPI. Otherwise, a complete review of systems is negative.  PAST MEDICAL HISTORY: Past Medical History:  Diagnosis Date  . Acute respiratory failure (HCC)    2017  . Chronic neck pain   . DDD (degenerative disc disease), cervical 05/04/2015  . Hemoptysis   . History of atrial fibrillation   .  History of head injury 05/04/2015  . History of tachycardia 05/04/2015  . Hypertension   . Pulmonary emboli (HCC)    on elequis  . Right eye injury     PAST SURGICAL HISTORY: Past Surgical History:  Procedure Laterality Date  . neck fusion     c5-7  . TONSILLECTOMY      FAMILY HISTORY: Family History  Problem Relation Age of Onset  . Hypertension Mother   . COPD Mother   . Mesothelioma Father   . COPD Father   . Peripheral vascular disease Brother     Clots in his arteries surgically removed    ADVANCED DIRECTIVES (Y/N):  N  HEALTH MAINTENANCE: Social History  Substance Use Topics  . Smoking status: Never Smoker  . Smokeless tobacco: Never Used  . Alcohol use No     Colonoscopy:  PAP:  Bone density:  Lipid panel:  Allergies  Allergen Reactions  . Tramadol Anaphylaxis and Rash  . Other Other (See Comments)    Rembrant Whitening strips  "fat lips"    Current Outpatient Prescriptions  Medication Sig Dispense Refill  . apixaban (ELIQUIS) 5 MG TABS tablet Take 2 tablets (10 mg total) by mouth 2 (two) times daily. 60 tablet 0  . bisacodyl (DULCOLAX) 5 MG EC tablet Take 2 tablets (10 mg total) by mouth at bedtime as needed for moderate constipation ((Hold for loose stool)). 100 tablet PRN  . docusate sodium (COLACE) 100 MG capsule Take 2 capsules (200 mg total) by mouth at bedtime as needed for moderate constipation. Do not use longer than 7 days. 60 capsule PRN  . gabapentin (NEURONTIN) 800 MG  tablet Take 1 tablet (800 mg total) by mouth every 6 (six) hours. 120 tablet 2  . MAGNESIUM PO Take 1 tablet by mouth daily.    Marland Kitchen oxyCODONE (OXY IR/ROXICODONE) 5 MG immediate release tablet Take 1 tablet (5 mg total) by mouth 5 (five) times daily as needed for severe pain. 150 tablet 0  . propranolol (INDERAL) 10 MG tablet 10 mg 3 (three) times daily.     Marland Kitchen tiZANidine (ZANAFLEX) 2 MG tablet Take 4 mg by mouth at bedtime.     . Wheat Dextrin (BENEFIBER) POWD Stir 2 tsp. TID  into 4-8 oz of any non-carbonated beverage or soft food (hot or cold) 500 g PRN   No current facility-administered medications for this visit.     OBJECTIVE: Vitals:   03/14/16 1318  BP: 136/89  Pulse: 87  Temp: 98 F (36.7 C)     Body mass index is 32.99 kg/m.    ECOG FS:0 - Asymptomatic  General: Well-developed, well-nourished, no acute distress. Eyes: Pink conjunctiva, anicteric sclera. HEENT: Normocephalic, moist mucous membranes, clear oropharnyx. Lungs: Clear to auscultation bilaterally. Heart: Regular rate and rhythm. No rubs, murmurs, or gallops. Abdomen: Soft, nontender, nondistended. No organomegaly noted, normoactive bowel sounds. Musculoskeletal: No edema, cyanosis, or clubbing. Neuro: Alert, answering all questions appropriately. Cranial nerves grossly intact. Skin: No rashes or petechiae noted. Psych: Normal affect. Lymphatics: No cervical, calvicular, axillary or inguinal LAD.   LAB RESULTS:  Lab Results  Component Value Date   NA 138 12/20/2015   K 3.5 12/20/2015   CL 104 12/20/2015   CO2 26 12/20/2015   GLUCOSE 111 (H) 12/20/2015   BUN 11 12/20/2015   CREATININE 0.86 12/20/2015   CALCIUM 8.9 12/20/2015   PROT 7.0 12/20/2015   ALBUMIN 3.0 (L) 12/20/2015   AST 33 12/20/2015   ALT 22 12/20/2015   ALKPHOS 58 12/20/2015   BILITOT 0.6 12/20/2015   GFRNONAA >60 12/20/2015   GFRAA >60 12/20/2015    Lab Results  Component Value Date   WBC 6.3 12/20/2015   NEUTROABS 4.4 12/20/2015   HGB 15.3 12/20/2015   HCT 46.4 12/20/2015   MCV 91.7 12/20/2015   PLT 332 12/20/2015     STUDIES: No results found.  ASSESSMENT: Saddle pulmonary embolism  PLAN:    1. Saddle pulmonary embolism: Patient's CT on December 13, 2015 revealed a large saddle pulmonary embolism extending into all lobes of both lungs with apparent occlusion of many segmental branches.  He also had significant right heart strain. No thrombectomy was done. He had no obvious transient risk  factors. Patient has a significant family history of blood clotting in both his brother and paternal grandmother. He has no personal history of blood clotting until now.  Partial hypercoagulable workup completed in June was negative including Factor V Leiden and the Prothrombin gene mutation.  The remainder of the workup was ordered today for completeness, but should be interpreted with caution since patient in on Eliquis. Although there is no obvious underlying clotting disorder, will consider recommending lifelong treatment given the massive size of his initial blood clot. Return to clinic in 3 months for discussion of his laboratory work and the risks and benefits of life-long anticoagulation.  Approximately 45 minutes was spent in discussion of which greater than 50% was consultation.  Patient expressed understanding and was in agreement with this plan. He also understands that He can call clinic at any time with any questions, concerns, or complaints.   No matching staging information  was found for the patient.  Jeralyn Ruthsimothy J Finnegan, MD   03/14/2016 4:10 PM

## 2016-03-15 LAB — LUPUS ANTICOAGULANT PANEL
DRVVT: 45.9 s (ref 0.0–47.0)
PTT LA: 35 s (ref 0.0–51.9)

## 2016-03-15 LAB — PROTEIN S ACTIVITY: Protein S Activity: 142 % — ABNORMAL HIGH (ref 63–140)

## 2016-03-15 LAB — PROTEIN S, TOTAL: Protein S Ag, Total: 88 % (ref 60–150)

## 2016-03-15 LAB — PROTEIN C ACTIVITY: Protein C Activity: 116 % (ref 73–180)

## 2016-03-16 LAB — PROTEIN C, TOTAL: PROTEIN C, TOTAL: 84 % (ref 60–150)

## 2016-03-22 ENCOUNTER — Telehealth: Payer: Self-pay | Admitting: Pain Medicine

## 2016-03-22 NOTE — Telephone Encounter (Signed)
Patient needs Miralax called in to pharmacy

## 2016-03-22 NOTE — Telephone Encounter (Signed)
Patient notified that he could get this over the counter.  Informed patient that he would need to make an appointment to get a new prescription.

## 2016-04-09 ENCOUNTER — Telehealth: Payer: Self-pay | Admitting: *Deleted

## 2016-04-09 ENCOUNTER — Other Ambulatory Visit: Payer: Self-pay | Admitting: Pain Medicine

## 2016-04-09 DIAGNOSIS — M7918 Myalgia, other site: Secondary | ICD-10-CM

## 2016-04-09 DIAGNOSIS — M62838 Other muscle spasm: Secondary | ICD-10-CM

## 2016-04-09 DIAGNOSIS — M792 Neuralgia and neuritis, unspecified: Secondary | ICD-10-CM

## 2016-04-09 MED ORDER — GABAPENTIN 800 MG PO TABS: 800.0000 mg | ORAL_TABLET | Freq: Four times a day (QID) | ORAL | 0 refills | Status: DC

## 2016-04-09 MED ORDER — TIZANIDINE HCL 2 MG PO CAPS: 2.0000 mg | ORAL_CAPSULE | Freq: Three times a day (TID) | ORAL | 0 refills | Status: DC | PRN

## 2016-04-09 NOTE — Telephone Encounter (Signed)
Patient states that at his last appointment Dr. Laban Emperor did not e-scribe his gabapentin. Will you e-scribe for him or does he need another appointment? Patient states that he reminded you that he needed it refilled at last med refill appointment.

## 2016-04-11 NOTE — Telephone Encounter (Signed)
This was taking care of yesterday. Remind the patient that it is his responsibility to know which medications he will be running out of before his next appointment. There will be no more phone, fax, or e-scripts.

## 2016-04-11 NOTE — Telephone Encounter (Signed)
Dr. Laban Emperor                      This is the second attempt to advise on this patient's medication refill for gabapentin. Please advise.

## 2016-04-18 DIAGNOSIS — I10 Essential (primary) hypertension: Secondary | ICD-10-CM | POA: Diagnosis not present

## 2016-04-18 DIAGNOSIS — N289 Disorder of kidney and ureter, unspecified: Secondary | ICD-10-CM | POA: Diagnosis not present

## 2016-04-18 DIAGNOSIS — K760 Fatty (change of) liver, not elsewhere classified: Secondary | ICD-10-CM | POA: Diagnosis not present

## 2016-04-18 DIAGNOSIS — E781 Pure hyperglyceridemia: Secondary | ICD-10-CM | POA: Diagnosis not present

## 2016-05-09 ENCOUNTER — Encounter: Payer: Medicare Other | Admitting: Pain Medicine

## 2016-05-10 ENCOUNTER — Encounter: Payer: Medicare Other | Admitting: Pain Medicine

## 2016-07-09 NOTE — Progress Notes (Signed)
[facility]  Telephone:([phone] [phone]  ID: Jared Bass OB: Jun 17, 1971  MR#: [id number]  XYO#:[id number]  Patient Care Team: Sherrie Mustache as PCP - General (Internal Medicine)  CHIEF COMPLAINT: Saddle pulmonary embolism  INTERVAL HISTORY: Patient returns to clinic today for further evaluation and discussion on whether to continue anticoagulation lifelong. Patient has chronic pain and is on disability. He has no neurologic complaints. He denies any recent fevers or illnesses. He has a good appetite and denies weight loss. He has no chest pain or shortness of breath. He denies any nausea, vomiting, constipation, or diarrhea. He has no easy bleeding or bruising. He has no urinary complaints. Patient feels at his baseline and offers no specific complaints today.   REVIEW OF SYSTEMS:   Review of Systems  Constitutional: Negative.  Negative for fever, malaise/fatigue and weight loss.  Respiratory: Negative.  Negative for cough and shortness of breath.   Cardiovascular: Negative.  Negative for chest pain.  Gastrointestinal: Negative.  Negative for abdominal pain, blood in stool and melena.  Musculoskeletal: Positive for back pain, joint pain and neck pain.  Neurological: Negative.  Negative for weakness.  Endo/Heme/Allergies: Does not bruise/bleed easily.  Psychiatric/Behavioral: Negative.  The patient is not nervous/anxious.     As per HPI. Otherwise, a complete review of systems is negative.  PAST MEDICAL HISTORY: Past Medical History:  Diagnosis Date  . Acute respiratory failure (HCC)    2017  . Chronic neck pain   . DDD (degenerative disc disease), cervical 05/04/2015  . Hemoptysis   . History of atrial fibrillation   . History of head injury 05/04/2015  . History of tachycardia 05/04/2015  . Hypertension   . Pulmonary emboli (HCC)    on elequis  . Right eye injury     PAST SURGICAL HISTORY: Past Surgical History:  Procedure Laterality Date   . neck fusion     c5-7  . TONSILLECTOMY      FAMILY HISTORY: Family History  Problem Relation Age of Onset  . Hypertension Mother   . COPD Mother   . Mesothelioma Father   . COPD Father   . Peripheral vascular disease Brother     Clots in his arteries surgically removed    ADVANCED DIRECTIVES (Y/N):  N  HEALTH MAINTENANCE: Social History  Substance Use Topics  . Smoking status: Never Smoker  . Smokeless tobacco: Never Used  . Alcohol use No     Colonoscopy:  PAP:  Bone density:  Lipid panel:  Allergies  Allergen Reactions  . Tramadol Anaphylaxis and Rash  . Other Other (See Comments)    Rembrant Whitening strips  "fat lips"    Current Outpatient Prescriptions  Medication Sig Dispense Refill  . apixaban (ELIQUIS) 5 MG TABS tablet Take 2 tablets (10 mg total) by mouth 2 (two) times daily. 60 tablet 0  . bisacodyl (DULCOLAX) 5 MG EC tablet Take 2 tablets (10 mg total) by mouth at bedtime as needed for moderate constipation ((Hold for loose stool)). 100 tablet PRN  . docusate sodium (COLACE) 100 MG capsule Take 2 capsules (200 mg total) by mouth at bedtime as needed for moderate constipation. Do not use longer than 7 days. 60 capsule PRN  . MAGNESIUM PO Take 1 tablet by mouth daily.    . propranolol (INDERAL) 10 MG tablet 10 mg 3 (three) times daily.     . tizanidine (ZANAFLEX) 2 MG capsule Take 1 capsule (2 mg total) by mouth 3 (three) times  daily as needed for muscle spasms. 147 capsule 0  . gabapentin (NEURONTIN) 800 MG tablet Take 1 tablet (800 mg total) by mouth every 6 (six) hours. (Patient not taking: Reported on 07/10/2016) 196 tablet 0  . oxyCODONE (OXY IR/ROXICODONE) 5 MG immediate release tablet Take 1 tablet (5 mg total) by mouth 5 (five) times daily as needed for severe pain. (Patient not taking: Reported on 07/10/2016) 150 tablet 0  . Wheat Dextrin (BENEFIBER) POWD Stir 2 tsp. TID into 4-8 oz of any non-carbonated beverage or soft food (hot or cold) (Patient  not taking: Reported on 07/10/2016) 500 g PRN   No current facility-administered medications for this visit.     OBJECTIVE: Vitals:   07/10/16 1506  BP: (!) 138/93  Pulse: 76  Resp: 18  Temp: 98.9 F (37.2 C)     Body mass index is 33.81 kg/m.    ECOG FS:0 - Asymptomatic  General: Well-developed, well-nourished, no acute distress. Eyes: Pink conjunctiva, anicteric sclera. HEENT: Normocephalic, moist mucous membranes, clear oropharnyx. Lungs: Clear to auscultation bilaterally. Heart: Regular rate and rhythm. No rubs, murmurs, or gallops. Abdomen: Soft, nontender, nondistended. No organomegaly noted, normoactive bowel sounds. Musculoskeletal: No edema, cyanosis, or clubbing. Neuro: Alert, answering all questions appropriately. Cranial nerves grossly intact. Skin: No rashes or petechiae noted. Psych: Normal affect. Lymphatics: No cervical, calvicular, axillary or inguinal LAD.   LAB RESULTS:  Lab Results  Component Value Date   NA 138 12/20/2015   K 3.5 12/20/2015   CL 104 12/20/2015   CO2 26 12/20/2015   GLUCOSE 111 (H) 12/20/2015   BUN 11 12/20/2015   CREATININE 0.86 12/20/2015   CALCIUM 8.9 12/20/2015   PROT 7.0 12/20/2015   ALBUMIN 3.0 (L) 12/20/2015   AST 33 12/20/2015   ALT 22 12/20/2015   ALKPHOS 58 12/20/2015   BILITOT 0.6 12/20/2015   GFRNONAA >60 12/20/2015   GFRAA >60 12/20/2015    Lab Results  Component Value Date   WBC 6.3 12/20/2015   NEUTROABS 4.4 12/20/2015   HGB 15.3 12/20/2015   HCT 46.4 12/20/2015   MCV 91.7 12/20/2015   PLT 332 12/20/2015     STUDIES: No results found.  ASSESSMENT: Saddle pulmonary embolism  PLAN:    1. Saddle pulmonary embolism: Patient's CT on December 13, 2015 revealed a large saddle pulmonary embolism extending into all lobes of both lungs with apparent occlusion of many segmental branches.  He also had significant right heart strain. No thrombectomy was done. He had no obvious transient risk factors. Patient has a  significant family history of blood clotting in both his brother and paternal grandmother. He has no personal history of blood clotting until now.  Patient's entire hypercoagulable workup including Factor V Leiden and the Prothrombin gene mutation is negative. Although there is no obvious underlying clotting disorder, have recommended lifelong treatment given the massive size of his initial blood clot and his positive family history. No further follow-up is necessary. Patient expressed understanding that all refills of his Eliquis can be obtained from his primary care physician.  Approximately 30 minutes was spent in discussion of which greater than 50% was consultation.  Patient expressed understanding and was in agreement with this plan. He also understands that He can call clinic at any time with any questions, concerns, or complaints.    Jeralyn Ruths, MD   07/13/2016 8:31 AM

## 2016-07-10 ENCOUNTER — Inpatient Hospital Stay: Payer: Medicare Other | Attending: Oncology | Admitting: Oncology

## 2016-07-10 VITALS — BP 138/93 | HR 76 | Temp 98.9°F | Resp 18 | Wt 222.3 lb

## 2016-07-10 DIAGNOSIS — Z79899 Other long term (current) drug therapy: Secondary | ICD-10-CM | POA: Insufficient documentation

## 2016-07-10 DIAGNOSIS — I10 Essential (primary) hypertension: Secondary | ICD-10-CM | POA: Insufficient documentation

## 2016-07-10 DIAGNOSIS — M4322 Fusion of spine, cervical region: Secondary | ICD-10-CM | POA: Insufficient documentation

## 2016-07-10 DIAGNOSIS — I4891 Unspecified atrial fibrillation: Secondary | ICD-10-CM | POA: Insufficient documentation

## 2016-07-10 DIAGNOSIS — M5030 Other cervical disc degeneration, unspecified cervical region: Secondary | ICD-10-CM | POA: Diagnosis not present

## 2016-07-10 DIAGNOSIS — I2602 Saddle embolus of pulmonary artery with acute cor pulmonale: Secondary | ICD-10-CM | POA: Insufficient documentation

## 2016-07-10 NOTE — Progress Notes (Signed)
Patient is here for follow up, he is doing well. He notes a knot on his sternum.

## 2016-07-12 ENCOUNTER — Telehealth: Payer: Self-pay | Admitting: *Deleted

## 2016-07-12 NOTE — Telephone Encounter (Signed)
Per Dr. Orlie Dakin, pt will require life long eliquis. Attempted to contact pt at home and on cell. Home phone number is disconnected and cell phone does not have voicemail set up to leave a message. A letter will be mailed to the pt.

## 2016-07-26 ENCOUNTER — Emergency Department: Payer: Medicare Other

## 2016-07-26 ENCOUNTER — Emergency Department
Admission: EM | Admit: 2016-07-26 | Discharge: 2016-07-26 | Disposition: A | Discharge status: Home / Self Care | Payer: Medicare Other | Attending: Emergency Medicine | Admitting: Emergency Medicine

## 2016-07-26 ENCOUNTER — Encounter: Payer: Self-pay | Admitting: *Deleted

## 2016-07-26 DIAGNOSIS — R0781 Pleurodynia: Secondary | ICD-10-CM

## 2016-07-26 DIAGNOSIS — W228XXA Striking against or struck by other objects, initial encounter: Secondary | ICD-10-CM | POA: Diagnosis not present

## 2016-07-26 DIAGNOSIS — Y9339 Activity, other involving climbing, rappelling and jumping off: Secondary | ICD-10-CM | POA: Insufficient documentation

## 2016-07-26 DIAGNOSIS — R0789 Other chest pain: Secondary | ICD-10-CM | POA: Diagnosis not present

## 2016-07-26 DIAGNOSIS — Y998 Other external cause status: Secondary | ICD-10-CM | POA: Insufficient documentation

## 2016-07-26 DIAGNOSIS — R1012 Left upper quadrant pain: Secondary | ICD-10-CM | POA: Diagnosis not present

## 2016-07-26 DIAGNOSIS — R05 Cough: Secondary | ICD-10-CM | POA: Diagnosis not present

## 2016-07-26 DIAGNOSIS — Y929 Unspecified place or not applicable: Secondary | ICD-10-CM | POA: Insufficient documentation

## 2016-07-26 DIAGNOSIS — R1011 Right upper quadrant pain: Secondary | ICD-10-CM | POA: Insufficient documentation

## 2016-07-26 DIAGNOSIS — R042 Hemoptysis: Secondary | ICD-10-CM | POA: Diagnosis not present

## 2016-07-26 DIAGNOSIS — S299XXA Unspecified injury of thorax, initial encounter: Secondary | ICD-10-CM | POA: Diagnosis present

## 2016-07-26 DIAGNOSIS — Z79899 Other long term (current) drug therapy: Secondary | ICD-10-CM | POA: Diagnosis not present

## 2016-07-26 DIAGNOSIS — I10 Essential (primary) hypertension: Secondary | ICD-10-CM | POA: Diagnosis not present

## 2016-07-26 DIAGNOSIS — R079 Chest pain, unspecified: Secondary | ICD-10-CM | POA: Diagnosis not present

## 2016-07-26 DIAGNOSIS — K760 Fatty (change of) liver, not elsewhere classified: Secondary | ICD-10-CM | POA: Diagnosis not present

## 2016-07-26 LAB — COMPREHENSIVE METABOLIC PANEL
ALT: 40 U/L (ref 17–63)
AST: 41 U/L (ref 15–41)
Albumin: 4 g/dL (ref 3.5–5.0)
Alkaline Phosphatase: 69 U/L (ref 38–126)
Anion gap: 8 (ref 5–15)
BILIRUBIN TOTAL: 0.6 mg/dL (ref 0.3–1.2)
BUN: 12 mg/dL (ref 6–20)
CHLORIDE: 101 mmol/L (ref 101–111)
CO2: 27 mmol/L (ref 22–32)
CREATININE: 0.97 mg/dL (ref 0.61–1.24)
Calcium: 8.6 mg/dL — ABNORMAL LOW (ref 8.9–10.3)
Glucose, Bld: 101 mg/dL — ABNORMAL HIGH (ref 65–99)
POTASSIUM: 4.1 mmol/L (ref 3.5–5.1)
Sodium: 136 mmol/L (ref 135–145)
TOTAL PROTEIN: 7.6 g/dL (ref 6.5–8.1)

## 2016-07-26 LAB — CBC WITH DIFFERENTIAL/PLATELET
BASOS ABS: 0.1 10*3/uL (ref 0–0.1)
Basophils Relative: 1 %
EOS ABS: 0.1 10*3/uL (ref 0–0.7)
EOS PCT: 2 %
HCT: 46.9 % (ref 40.0–52.0)
HEMOGLOBIN: 15.3 g/dL (ref 13.0–18.0)
LYMPHS ABS: 2.3 10*3/uL (ref 1.0–3.6)
LYMPHS PCT: 31 %
MCH: 30.2 pg (ref 26.0–34.0)
MCHC: 32.7 g/dL (ref 32.0–36.0)
MCV: 92.4 fL (ref 80.0–100.0)
Monocytes Absolute: 0.9 10*3/uL (ref 0.2–1.0)
Monocytes Relative: 12 %
Neutro Abs: 3.9 10*3/uL (ref 1.4–6.5)
Neutrophils Relative %: 54 %
PLATELETS: 199 10*3/uL (ref 150–440)
RBC: 5.08 MIL/uL (ref 4.40–5.90)
RDW: 14.2 % (ref 11.5–14.5)
WBC: 7.3 10*3/uL (ref 3.8–10.6)

## 2016-07-26 MED ORDER — HYDROMORPHONE HCL 1 MG/ML IJ SOLN
0.5000 mg | Freq: Once | INTRAMUSCULAR | Status: AC
  Administered 2016-07-26: 0.5 mg via INTRAVENOUS
  Filled 2016-07-26: qty 1

## 2016-07-26 MED ORDER — ONDANSETRON HCL 4 MG/2ML IJ SOLN
4.0000 mg | Freq: Once | INTRAMUSCULAR | Status: AC
  Administered 2016-07-26: 4 mg via INTRAVENOUS
  Filled 2016-07-26: qty 2

## 2016-07-26 MED ORDER — IOPAMIDOL (ISOVUE-300) INJECTION 61%
100.0000 mL | Freq: Once | INTRAVENOUS | Status: AC | PRN
  Administered 2016-07-26: 100 mL via INTRAVENOUS

## 2016-07-26 NOTE — ED Notes (Signed)
Patient transported to CT 
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
## 2016-07-26 NOTE — Discharge Instructions (Signed)
Use Advil or Tylenol for pain. Please return for any shortness of breath fever coughing up more blood or any other problems.
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
## 2016-07-28 ENCOUNTER — Emergency Department (HOSPITAL_COMMUNITY): Payer: Medicare Other

## 2016-07-28 ENCOUNTER — Encounter (HOSPITAL_COMMUNITY): Payer: Self-pay | Admitting: *Deleted

## 2016-07-28 ENCOUNTER — Emergency Department (HOSPITAL_COMMUNITY)
Admission: EM | Admit: 2016-07-28 | Discharge: 2016-07-28 | Disposition: A | Discharge status: Home / Self Care | Payer: Medicare Other | Attending: Emergency Medicine | Admitting: Emergency Medicine

## 2016-07-28 DIAGNOSIS — Y999 Unspecified external cause status: Secondary | ICD-10-CM | POA: Insufficient documentation

## 2016-07-28 DIAGNOSIS — X500XXA Overexertion from strenuous movement or load, initial encounter: Secondary | ICD-10-CM | POA: Insufficient documentation

## 2016-07-28 DIAGNOSIS — R531 Weakness: Secondary | ICD-10-CM | POA: Diagnosis not present

## 2016-07-28 DIAGNOSIS — I10 Essential (primary) hypertension: Secondary | ICD-10-CM | POA: Insufficient documentation

## 2016-07-28 DIAGNOSIS — Z7902 Long term (current) use of antithrombotics/antiplatelets: Secondary | ICD-10-CM | POA: Diagnosis not present

## 2016-07-28 DIAGNOSIS — M542 Cervicalgia: Secondary | ICD-10-CM | POA: Diagnosis not present

## 2016-07-28 DIAGNOSIS — M25519 Pain in unspecified shoulder: Secondary | ICD-10-CM | POA: Diagnosis not present

## 2016-07-28 DIAGNOSIS — M5412 Radiculopathy, cervical region: Secondary | ICD-10-CM | POA: Diagnosis not present

## 2016-07-28 DIAGNOSIS — Z79899 Other long term (current) drug therapy: Secondary | ICD-10-CM | POA: Insufficient documentation

## 2016-07-28 DIAGNOSIS — Y939 Activity, unspecified: Secondary | ICD-10-CM | POA: Diagnosis not present

## 2016-07-28 DIAGNOSIS — Y92009 Unspecified place in unspecified non-institutional (private) residence as the place of occurrence of the external cause: Secondary | ICD-10-CM | POA: Diagnosis not present

## 2016-07-28 MED ORDER — PREDNISONE 20 MG PO TABS
60.0000 mg | ORAL_TABLET | Freq: Once | ORAL | Status: AC
  Administered 2016-07-28: 60 mg via ORAL
  Filled 2016-07-28: qty 3

## 2016-07-28 MED ORDER — PREDNISONE 20 MG PO TABS: 40.0000 mg | ORAL_TABLET | Freq: Every day | ORAL | 0 refills | Status: AC

## 2016-07-28 MED ORDER — OXYCODONE-ACETAMINOPHEN 5-325 MG PO TABS
1.0000 | ORAL_TABLET | Freq: Once | ORAL | Status: AC
  Administered 2016-07-28: 1 via ORAL
  Filled 2016-07-28: qty 1

## 2016-07-28 MED ORDER — ACETAMINOPHEN 500 MG PO TABS: 500.0000 mg | ORAL_TABLET | Freq: Three times a day (TID) | ORAL | 0 refills | Status: DC | PRN

## 2016-07-28 MED ORDER — CYCLOBENZAPRINE HCL 10 MG PO TABS: 10.0000 mg | ORAL_TABLET | Freq: Three times a day (TID) | ORAL | 0 refills | Status: DC | PRN

## 2016-07-28 NOTE — ED Provider Notes (Signed)
[facility]-EMERGENCY DEPT Provider Note   CSN: [id number] Arrival date & time: 07/28/16  0160     History   Chief Complaint Chief Complaint  Patient presents with  . Neck Pain  . Arm Pain    HPI Jared Bass is a 46 y.o. male.  HPI Patient ports a history of prior cervical fusions in [facility].  He states that he has been doing extra work at his house and lifting when he developed pain in his neck.  He reports a new shooting sensation down his right arm.  He has not had any recent imaging of his neck.  He denies fevers or chills.  He denies weakness of his arms or legs.  His pain is moderate in severity.  He previously was managed in a outpatient pain clinic for chronic pain with Percocet and reports that he lost his relationship several months ago with his pain clinic after a missed appointment.  He is currently scheduled to see a new pain provider in [place] in early February.  He reports moderate to severe pain at this time.   Past Medical History:  Diagnosis Date  . Acute respiratory failure (HCC)    2017  . Chronic neck pain   . DDD (degenerative disc disease), cervical 05/04/2015  . Hemoptysis   . History of atrial fibrillation   . History of head injury 05/04/2015  . History of tachycardia 05/04/2015  . Hypertension   . Pulmonary emboli (HCC)    on elequis  . Right eye injury     Patient Active Problem List   Diagnosis Date Noted  . Hemoptysis 12/20/2015  . Tachycardia 12/20/2015  . Pulmonary emboli (HCC)   . Pulmonary embolism (HCC)   . Vitamin D insufficiency 12/19/2015  . Acute saddle pulmonary embolism with acute cor pulmonale (HCC)   . Hypotension   . Syncope and collapse   . Acute respiratory failure with hypoxia (HCC)   . Pulmonary hypertension   . Acute renal failure (HCC)   . Closed fracture of nasal bone   . Faintness   . Saddle pulmonary embolus (HCC) 12/13/2015  . Opioid-induced constipation 11/30/2015  . Chronic  upper extremity pain (Location of Primary Source of Pain) (Bilateral) (R>L) 08/31/2015  . Radicular pain of shoulder (Right) 08/31/2015  . Chronic pain 05/04/2015  . Chronic neck pain (Location of Secondary source of pain) (Bilateral) (R>L) 05/04/2015  . Cervical spondylosis 05/04/2015  . 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 Long term current use of opiate analgesic 05/04/2015  . Long term prescription opiate use 05/04/2015  . Opiate use (37.5 MME/Day) 05/04/2015  . Opiate dependence (HCC) 05/04/2015  . Encounter for therapeutic drug level monitoring 05/04/2015  . Neurogenic pain 05/04/2015  . Chronic cervical radicular pain (Location of Primary Source of Pain) (Bilateral) (R>L) (C6 and C7 Dermatome) 05/04/2015  . Chronic shoulder pain (Right) 05/04/2015  . Chronic elbow pain (Right) 05/04/2015  . Cervical spinal stenosis 05/04/2015  . Cervical foraminal stenosis (right side) 05/04/2015  . Myofascial pain 05/04/2015  . Muscle spasms of neck 05/04/2015  . History of tachycardia 05/04/2015  . Essential hypertension 05/04/2015  . History of head injury 05/04/2015  . Generalized anxiety disorder 05/04/2015  . Hyperlipidemia 05/04/2015  . MACULAR DEGENERATION 12/29/2007  . Status post C6-7 ACDF 10/31/2006    Past Surgical History:  Procedure Laterality Date  . neck fusion     c5-7  . TONSILLECTOMY  Home Medications    Prior to Admission medications   Medication Sig Start Date End Date Taking? Authorizing Provider  acetaminophen (TYLENOL) 500 MG tablet Take 1 tablet (500 mg total) by mouth every 8 (eight) hours as needed. 07/28/16   Azalia Bilis, MD  apixaban (ELIQUIS) 5 MG TABS tablet Take 2 tablets (10 mg total) by mouth 2 (two) times daily. 12/18/15   Drema Dallas, MD  bisacodyl (DULCOLAX) 5 MG EC tablet Take 2 tablets (10 mg total) by mouth at bedtime as needed for moderate constipation ((Hold for loose stool)). 02/15/16   Delano Metz,  MD  cyclobenzaprine (FLEXERIL) 10 MG tablet Take 1 tablet (10 mg total) by mouth 3 (three) times daily as needed for muscle spasms. 07/28/16   Azalia Bilis, MD  docusate sodium (COLACE) 100 MG capsule Take 2 capsules (200 mg total) by mouth at bedtime as needed for moderate constipation. Do not use longer than 7 days. 02/15/16   Delano Metz, MD  gabapentin (NEURONTIN) 800 MG tablet Take 1 tablet (800 mg total) by mouth every 6 (six) hours. Patient not taking: Reported on 07/10/2016 04/09/16 05/28/16  Delano Metz, MD  MAGNESIUM PO Take 1 tablet by mouth daily.    Historical Provider, MD  oxyCODONE (OXY IR/ROXICODONE) 5 MG immediate release tablet Take 1 tablet (5 mg total) by mouth 5 (five) times daily as needed for severe pain. Patient not taking: Reported on 07/10/2016 02/13/16   Delano Metz, MD  predniSONE (DELTASONE) 20 MG tablet Take 2 tablets (40 mg total) by mouth daily. 07/28/16 08/02/16  Azalia Bilis, MD  propranolol (INDERAL) 10 MG tablet 10 mg 3 (three) times daily.  01/14/16   Historical Provider, MD  tizanidine (ZANAFLEX) 2 MG capsule Take 1 capsule (2 mg total) by mouth 3 (three) times daily as needed for muscle spasms. 04/09/16 07/10/16  Delano Metz, MD  Wheat Dextrin (BENEFIBER) POWD Stir 2 tsp. TID into 4-8 oz of any non-carbonated beverage or soft food (hot or cold) Patient not taking: Reported on 07/10/2016 02/15/16   Delano Metz, MD    Family History Family History  Problem Relation Age of Onset  . Hypertension Mother   . COPD Mother   . Mesothelioma Father   . COPD Father   . Peripheral vascular disease Brother     Clots in his arteries surgically removed    Social History Social History  Substance Use Topics  . Smoking status: Never Smoker  . Smokeless tobacco: Never Used  . Alcohol use No     Allergies   Tramadol and Other   Review of Systems Review of Systems  All other systems reviewed and are negative.    Physical Exam Updated Vital  Signs BP 143/97   Pulse 81   Temp 99.1 F (37.3 C) (Oral)   Resp 16   SpO2 95%   Physical Exam  Constitutional: He is oriented to person, place, and time. He appears well-developed and well-nourished.  HENT:  Head: Normocephalic.  Eyes: EOM are normal.  Neck: Normal range of motion.  No C-spine tenderness.  Cardiovascular: Normal rate.   Pulmonary/Chest: Effort normal.  Abdominal: He exhibits no distension.  Musculoskeletal: Normal range of motion.  5 out of 5 strength in bilateral upper lower extremity major muscle groups.  Neurological: He is alert and oriented to person, place, and time.  Psychiatric: He has a normal mood and affect.  Nursing note and vitals reviewed.    ED Treatments / Results  Labs (all  labs ordered are listed, but only abnormal results are displayed) Labs Reviewed - No data to display  EKG  EKG Interpretation None       Radiology  Dg Cervical Spine Complete  Result Date: 07/28/2016 CLINICAL DATA:  Right neck pain, radiculopathy, previous C5-C7 fusion EXAM: CERVICAL SPINE - COMPLETE 4+ VIEW COMPARISON:  12/13/2015 CT cervical spine FINDINGS: Straightened cervical spine alignment may be positional or postoperative. Patient is status post anterior cervical discectomy and fusion at C5-6 and C6-7. Solid fusion at both levels. Minor degenerative spondylosis at C3-4 and C4-5. Normal prevertebral soft tissues. Facets are aligned. Foramina appear patent. Odontoid intact. Trachea is midline. Lung apices are clear. IMPRESSION: Remote C5-C7 anterior fusion with solid fusion at both levels. Minor degenerative spondylosis at C3-4 and C4-5 No acute finding by plain radiography Electronically Signed   By: Judie Petit.  Shick M.D.   On: 07/28/2016 10:12      Procedures Procedures (including critical care time)  Medications Ordered in ED Medications  predniSONE (DELTASONE) tablet 60 mg (60 mg Oral Given 07/28/16 0938)  oxyCODONE-acetaminophen (PERCOCET/ROXICET) 5-325 MG  per tablet 1 tablet (1 tablet Oral Given 07/28/16 8119)     Initial Impression / Assessment and Plan / ED Course  I have reviewed the triage vital signs and the nursing notes.  Pertinent labs & imaging results that were available during my care of the patient were reviewed by me and considered in my medical decision making (see chart for details).     No indication for advanced imaging of the neck.  He may need an outpatient MRI if his symptoms persist.  Home with a prescription for muscle relaxants and anti-inflammatories and a short course of steroids.  He understands return to the ER for new or worsening symptoms.  He is encouraged to continue following up with his new pain physician.  Neurosurgical outpatient referral given if his symptoms persist to assist with obtaining outpatient MRI and further management since he no longer lives in [facility] where his prior cervical fusions were performed  Final Clinical Impressions(s) / ED Diagnoses   Final diagnoses:  Neck pain  Cervical radicular pain    New Prescriptions New Prescriptions   ACETAMINOPHEN (TYLENOL) 500 MG TABLET    Take 1 tablet (500 mg total) by mouth every 8 (eight) hours as needed.   CYCLOBENZAPRINE (FLEXERIL) 10 MG TABLET    Take 1 tablet (10 mg total) by mouth 3 (three) times daily as needed for muscle spasms.   PREDNISONE (DELTASONE) 20 MG TABLET    Take 2 tablets (40 mg total) by mouth daily.     Azalia Bilis, MD 07/28/16 1043

## 2016-07-28 NOTE — ED Triage Notes (Signed)
Pt bib PTAR and coming from home.  Pt presents with neck pain and tingling and burning down the right arm.  Pt was moving furniture about an hour prior to arrival and felt and "pop" in his neck.  PTAR reports full ROM on neck and arm.  Hx of C4/C5 fusions.  Pt a/o x 4 and ambulatory.

## 2016-07-28 NOTE — ED Notes (Signed)
Bed: NO67 Expected date:  Expected time:  Means of arrival:  Comments: 46 yo neck pain

## 2016-07-30 DIAGNOSIS — E559 Vitamin D deficiency, unspecified: Secondary | ICD-10-CM | POA: Diagnosis not present

## 2016-07-30 DIAGNOSIS — Z0000 Encounter for general adult medical examination without abnormal findings: Secondary | ICD-10-CM | POA: Diagnosis not present

## 2016-07-30 DIAGNOSIS — E784 Other hyperlipidemia: Secondary | ICD-10-CM | POA: Diagnosis not present

## 2016-07-30 DIAGNOSIS — I10 Essential (primary) hypertension: Secondary | ICD-10-CM | POA: Diagnosis not present

## 2016-07-30 DIAGNOSIS — N289 Disorder of kidney and ureter, unspecified: Secondary | ICD-10-CM | POA: Diagnosis not present

## 2016-08-09 ENCOUNTER — Ambulatory Visit: Payer: Medicare Other | Attending: Pain Medicine | Admitting: Pain Medicine

## 2016-08-09 ENCOUNTER — Encounter: Payer: Self-pay | Admitting: Pain Medicine

## 2016-08-09 VITALS — BP 135/105 | HR 88 | Temp 98.7°F | Resp 16 | Ht 68.0 in | Wt 220.0 lb

## 2016-08-09 DIAGNOSIS — M25521 Pain in right elbow: Secondary | ICD-10-CM | POA: Insufficient documentation

## 2016-08-09 DIAGNOSIS — F411 Generalized anxiety disorder: Secondary | ICD-10-CM | POA: Diagnosis not present

## 2016-08-09 DIAGNOSIS — F1190 Opioid use, unspecified, uncomplicated: Secondary | ICD-10-CM

## 2016-08-09 DIAGNOSIS — M792 Neuralgia and neuritis, unspecified: Secondary | ICD-10-CM

## 2016-08-09 DIAGNOSIS — G894 Chronic pain syndrome: Secondary | ICD-10-CM | POA: Insufficient documentation

## 2016-08-09 DIAGNOSIS — G8929 Other chronic pain: Secondary | ICD-10-CM | POA: Diagnosis not present

## 2016-08-09 DIAGNOSIS — E785 Hyperlipidemia, unspecified: Secondary | ICD-10-CM | POA: Diagnosis not present

## 2016-08-09 DIAGNOSIS — Z888 Allergy status to other drugs, medicaments and biological substances status: Secondary | ICD-10-CM | POA: Diagnosis not present

## 2016-08-09 DIAGNOSIS — M25511 Pain in right shoulder: Secondary | ICD-10-CM | POA: Insufficient documentation

## 2016-08-09 DIAGNOSIS — M961 Postlaminectomy syndrome, not elsewhere classified: Secondary | ICD-10-CM

## 2016-08-09 DIAGNOSIS — M62838 Other muscle spasm: Secondary | ICD-10-CM | POA: Diagnosis not present

## 2016-08-09 DIAGNOSIS — J9601 Acute respiratory failure with hypoxia: Secondary | ICD-10-CM | POA: Diagnosis not present

## 2016-08-09 DIAGNOSIS — M79601 Pain in right arm: Secondary | ICD-10-CM | POA: Insufficient documentation

## 2016-08-09 DIAGNOSIS — M791 Myalgia: Secondary | ICD-10-CM | POA: Diagnosis not present

## 2016-08-09 DIAGNOSIS — I4891 Unspecified atrial fibrillation: Secondary | ICD-10-CM | POA: Insufficient documentation

## 2016-08-09 DIAGNOSIS — R51 Headache: Secondary | ICD-10-CM | POA: Diagnosis not present

## 2016-08-09 DIAGNOSIS — Z79891 Long term (current) use of opiate analgesic: Secondary | ICD-10-CM | POA: Diagnosis not present

## 2016-08-09 DIAGNOSIS — M542 Cervicalgia: Secondary | ICD-10-CM

## 2016-08-09 DIAGNOSIS — I10 Essential (primary) hypertension: Secondary | ICD-10-CM | POA: Insufficient documentation

## 2016-08-09 DIAGNOSIS — M4802 Spinal stenosis, cervical region: Secondary | ICD-10-CM | POA: Insufficient documentation

## 2016-08-09 DIAGNOSIS — I959 Hypotension, unspecified: Secondary | ICD-10-CM | POA: Insufficient documentation

## 2016-08-09 DIAGNOSIS — R55 Syncope and collapse: Secondary | ICD-10-CM | POA: Insufficient documentation

## 2016-08-09 DIAGNOSIS — E559 Vitamin D deficiency, unspecified: Secondary | ICD-10-CM | POA: Diagnosis not present

## 2016-08-09 DIAGNOSIS — M5412 Radiculopathy, cervical region: Secondary | ICD-10-CM | POA: Insufficient documentation

## 2016-08-09 DIAGNOSIS — I272 Pulmonary hypertension, unspecified: Secondary | ICD-10-CM | POA: Diagnosis not present

## 2016-08-09 DIAGNOSIS — Z9889 Other specified postprocedural states: Secondary | ICD-10-CM | POA: Insufficient documentation

## 2016-08-09 DIAGNOSIS — K5900 Constipation, unspecified: Secondary | ICD-10-CM | POA: Insufficient documentation

## 2016-08-09 DIAGNOSIS — M7918 Myalgia, other site: Secondary | ICD-10-CM

## 2016-08-09 DIAGNOSIS — Z79899 Other long term (current) drug therapy: Secondary | ICD-10-CM | POA: Insufficient documentation

## 2016-08-09 MED ORDER — OXYCODONE HCL 5 MG PO TABS: 5.0000 mg | ORAL_TABLET | Freq: Three times a day (TID) | ORAL | 0 refills | Status: DC | PRN

## 2016-08-09 MED ORDER — TIZANIDINE HCL 2 MG PO CAPS: 2.0000 mg | ORAL_CAPSULE | Freq: Three times a day (TID) | ORAL | 2 refills | Status: DC | PRN

## 2016-08-09 MED ORDER — GABAPENTIN 800 MG PO TABS: 800.0000 mg | ORAL_TABLET | Freq: Four times a day (QID) | ORAL | 2 refills | Status: DC

## 2016-08-09 NOTE — Patient Instructions (Signed)
GENERAL RISKS AND COMPLICATIONS  What are the risk, side effects and possible complications? Generally speaking, most procedures are safe.  However, with any procedure there are risks, side effects, and the possibility of complications.  The risks and complications are dependent upon the sites that are lesioned, or the type of nerve block to be performed.  The closer the procedure is to the spine, the more serious the risks are.  Great care is taken when placing the radio frequency needles, block needles or lesioning probes, but sometimes complications can occur. 1. Infection: Any time there is an injection through the skin, there is a risk of infection.  This is why sterile conditions are used for these blocks.  There are four possible types of infection. 1. Localized skin infection. 2. Central Nervous System Infection-This can be in the form of Meningitis, which can be deadly. 3. Epidural Infections-This can be in the form of an epidural abscess, which can cause pressure inside of the spine, causing compression of the spinal cord with subsequent paralysis. This would require an emergency surgery to decompress, and there are no guarantees that the patient would recover from the paralysis. 4. Discitis-This is an infection of the intervertebral discs.  It occurs in about 1% of discography procedures.  It is difficult to treat and it may lead to surgery.        2. Pain: the needles have to go through skin and soft tissues, will cause soreness.       3. Damage to internal structures:  The nerves to be lesioned may be near blood vessels or    other nerves which can be potentially damaged.       4. Bleeding: Bleeding is more common if the patient is taking blood thinners such as  aspirin, Coumadin, Ticiid, Plavix, etc., or if he/she have some genetic predisposition  such as hemophilia. Bleeding into the spinal canal can cause compression of the spinal  cord with subsequent paralysis.  This would require an  emergency surgery to  decompress and there are no guarantees that the patient would recover from the  paralysis.       5. Pneumothorax:  Puncturing of a lung is a possibility, every time a needle is introduced in  the area of the chest or upper back.  Pneumothorax refers to free air around the  collapsed lung(s), inside of the thoracic cavity (chest cavity).  Another two possible  complications related to a similar event would include: Hemothorax and Chylothorax.   These are variations of the Pneumothorax, where instead of air around the collapsed  lung(s), you may have blood or chyle, respectively.       6. Spinal headaches: They may occur with any procedures in the area of the spine.       7. Persistent CSF (Cerebro-Spinal Fluid) leakage: This is a rare problem, but may occur  with prolonged intrathecal or epidural catheters either due to the formation of a fistulous  track or a dural tear.       8. Nerve damage: By working so close to the spinal cord, there is always a possibility of  nerve damage, which could be as serious as a permanent spinal cord injury with  paralysis.       9. Death:  Although rare, severe deadly allergic reactions known as "Anaphylactic  reaction" can occur to any of the medications used.      10. Worsening of the symptoms:  We can always make thing worse.    What are the chances of something like this happening? Chances of any of this occuring are extremely low.  By statistics, you have more of a chance of getting killed in a motor vehicle accident: while driving to the hospital than any of the above occurring .  Nevertheless, you should be aware that they are possibilities.  In general, it is similar to taking a shower.  Everybody knows that you can slip, hit your head and get killed.  Does that mean that you should not shower again?  Nevertheless always keep in mind that statistics do not mean anything if you happen to be on the wrong side of them.  Even if a procedure has a 1  (one) in a 1,000,000 (million) chance of going wrong, it you happen to be that one..Also, keep in mind that by statistics, you have more of a chance of having something go wrong when taking medications.  Who should not have this procedure? If you are on a blood thinning medication (e.g. Coumadin, Plavix, see list of "Blood Thinners"), or if you have an active infection going on, you should not have the procedure.  If you are taking any blood thinners, please inform your physician.  How should I prepare for this procedure?  Do not eat or drink anything at least six hours prior to the procedure.  Bring a driver with you .  It cannot be a taxi.  Come accompanied by an adult that can drive you back, and that is strong enough to help you if your legs get weak or numb from the local anesthetic.  Take all of your medicines the morning of the procedure with just enough water to swallow them.  If you have diabetes, make sure that you are scheduled to have your procedure done first thing in the morning, whenever possible.  If you have diabetes, take only half of your insulin dose and notify our nurse that you have done so as soon as you arrive at the clinic.  If you are diabetic, but only take blood sugar pills (oral hypoglycemic), then do not take them on the morning of your procedure.  You may take them after you have had the procedure.  Do not take aspirin or any aspirin-containing medications, at least eleven (11) days prior to the procedure.  They may prolong bleeding.  Wear loose fitting clothing that may be easy to take off and that you would not mind if it got stained with Betadine or blood.  Do not wear any jewelry or perfume  Remove any nail coloring.  It will interfere with some of our monitoring equipment.  NOTE: Remember that this is not meant to be interpreted as a complete list of all possible complications.  Unforeseen problems may occur.  BLOOD THINNERS The following drugs  contain aspirin or other products, which can cause increased bleeding during surgery and should not be taken for 2 weeks prior to and 1 week after surgery.  If you should need take something for relief of minor pain, you may take acetaminophen which is found in Tylenol,m Datril, Anacin-3 and Panadol. It is not blood thinner. The products listed below are.  Do not take any of the products listed below in addition to any listed on your instruction sheet.  A.P.C or A.P.C with Codeine Codeine Phosphate Capsules #3 Ibuprofen Ridaura  ABC compound Congesprin Imuran rimadil  Advil Cope Indocin Robaxisal  Alka-Seltzer Effervescent Pain Reliever and Antacid Coricidin or Coricidin-D  Indomethacin Rufen    Alka-Seltzer plus Cold Medicine Cosprin Ketoprofen S-A-C Tablets  Anacin Analgesic Tablets or Capsules Coumadin Korlgesic Salflex  Anacin Extra Strength Analgesic tablets or capsules CP-2 Tablets Lanoril Salicylate  Anaprox Cuprimine Capsules Levenox Salocol  Anexsia-D Dalteparin Magan Salsalate  Anodynos Darvon compound Magnesium Salicylate Sine-off  Ansaid Dasin Capsules Magsal Sodium Salicylate  Anturane Depen Capsules Marnal Soma  APF Arthritis pain formula Dewitt's Pills Measurin Stanback  Argesic Dia-Gesic Meclofenamic Sulfinpyrazone  Arthritis Bayer Timed Release Aspirin Diclofenac Meclomen Sulindac  Arthritis pain formula Anacin Dicumarol Medipren Supac  Analgesic (Safety coated) Arthralgen Diffunasal Mefanamic Suprofen  Arthritis Strength Bufferin Dihydrocodeine Mepro Compound Suprol  Arthropan liquid Dopirydamole Methcarbomol with Aspirin Synalgos  ASA tablets/Enseals Disalcid Micrainin Tagament  Ascriptin Doan's Midol Talwin  Ascriptin A/D Dolene Mobidin Tanderil  Ascriptin Extra Strength Dolobid Moblgesic Ticlid  Ascriptin with Codeine Doloprin or Doloprin with Codeine Momentum Tolectin  Asperbuf Duoprin Mono-gesic Trendar  Aspergum Duradyne Motrin or Motrin IB Triminicin  Aspirin  plain, buffered or enteric coated Durasal Myochrisine Trigesic  Aspirin Suppositories Easprin Nalfon Trillsate  Aspirin with Codeine Ecotrin Regular or Extra Strength Naprosyn Uracel  Atromid-S Efficin Naproxen Ursinus  Auranofin Capsules Elmiron Neocylate Vanquish  Axotal Emagrin Norgesic Verin  Azathioprine Empirin or Empirin with Codeine Normiflo Vitamin E  Azolid Emprazil Nuprin Voltaren  Bayer Aspirin plain, buffered or children's or timed BC Tablets or powders Encaprin Orgaran Warfarin Sodium  Buff-a-Comp Enoxaparin Orudis Zorpin  Buff-a-Comp with Codeine Equegesic Os-Cal-Gesic   Buffaprin Excedrin plain, buffered or Extra Strength Oxalid   Bufferin Arthritis Strength Feldene Oxphenbutazone   Bufferin plain or Extra Strength Feldene Capsules Oxycodone with Aspirin   Bufferin with Codeine Fenoprofen Fenoprofen Pabalate or Pabalate-SF   Buffets II Flogesic Panagesic   Buffinol plain or Extra Strength Florinal or Florinal with Codeine Panwarfarin   Buf-Tabs Flurbiprofen Penicillamine   Butalbital Compound Four-way cold tablets Penicillin   Butazolidin Fragmin Pepto-Bismol   Carbenicillin Geminisyn Percodan   Carna Arthritis Reliever Geopen Persantine   Carprofen Gold's salt Persistin   Chloramphenicol Goody's Phenylbutazone   Chloromycetin Haltrain Piroxlcam   Clmetidine heparin Plaquenil   Cllnoril Hyco-pap Ponstel   Clofibrate Hydroxy chloroquine Propoxyphen         Before stopping any of these medications, be sure to consult the physician who ordered them.  Some, such as Coumadin (Warfarin) are ordered to prevent or treat serious conditions such as "deep thrombosis", "pumonary embolisms", and other heart problems.  The amount of time that you may need off of the medication may also vary with the medication and the reason for which you were taking it.  If you are taking any of these medications, please make sure you notify your pain physician before you undergo any  procedures.         Epidural Steroid Injection Patient Information  Description: The epidural space surrounds the nerves as they exit the spinal cord.  In some patients, the nerves can be compressed and inflamed by a bulging disc or a tight spinal canal (spinal stenosis).  By injecting steroids into the epidural space, we can bring irritated nerves into direct contact with a potentially helpful medication.  These steroids act directly on the irritated nerves and can reduce swelling and inflammation which often leads to decreased pain.  Epidural steroids may be injected anywhere along the spine and from the neck to the low back depending upon the location of your pain.   After numbing the skin with local anesthetic (like Novocaine), a small needle is passed   into the epidural space slowly.  You may experience a sensation of pressure while this is being done.  The entire block usually last less than 10 minutes.  Conditions which may be treated by epidural steroids:   Low back and leg pain  Neck and arm pain  Spinal stenosis  Post-laminectomy syndrome  Herpes zoster (shingles) pain  Pain from compression fractures  Preparation for the injection:  1. Do not eat any solid food or dairy products within 8 hours of your appointment.  2. You may drink clear liquids up to 3 hours before appointment.  Clear liquids include water, black coffee, juice or soda.  No milk or cream please. 3. You may take your regular medication, including pain medications, with a sip of water before your appointment  Diabetics should hold regular insulin (if taken separately) and take 1/2 normal NPH dos the morning of the procedure.  Carry some sugar containing items with you to your appointment. 4. A driver must accompany you and be prepared to drive you home after your procedure.  5. Bring all your current medications with your. 6. An IV may be inserted and sedation may be given at the discretion of the  physician.   7. A blood pressure cuff, EKG and other monitors will often be applied during the procedure.  Some patients may need to have extra oxygen administered for a short period. 8. You will be asked to provide medical information, including your allergies, prior to the procedure.  We must know immediately if you are taking blood thinners (like Coumadin/Warfarin)  Or if you are allergic to IV iodine contrast (dye). We must know if you could possible be pregnant.  Possible side-effects:  Bleeding from needle site  Infection (rare, may require surgery)  Nerve injury (rare)  Numbness & tingling (temporary)  Difficulty urinating (rare, temporary)  Spinal headache ( a headache worse with upright posture)  Light -headedness (temporary)  Pain at injection site (several days)  Decreased blood pressure (temporary)  Weakness in arm/leg (temporary)  Pressure sensation in back/neck (temporary)  Call if you experience:  Fever/chills associated with headache or increased back/neck pain.  Headache worsened by an upright position.  New onset weakness or numbness of an extremity below the injection site  Hives or difficulty breathing (go to the emergency room)  Inflammation or drainage at the infection site  Severe back/neck pain  Any new symptoms which are concerning to you  Please note:  Although the local anesthetic injected can often make your back or neck feel good for several hours after the injection, the pain will likely return.  It takes 3-7 days for steroids to work in the epidural space.  You may not notice any pain relief for at least that one week.  If effective, we will often do a series of three injections spaced 3-6 weeks apart to maximally decrease your pain.  After the initial series, we generally will wait several months before considering a repeat injection of the same type.  If you have any questions, please call [phone] [facility] 
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
## 2016-08-09 NOTE — Progress Notes (Signed)
Nursing Pain Medication Assessment:  Safety precautions to be maintained throughout the outpatient stay will include: orient to surroundings, keep bed in low position, maintain call bell within reach at all times, provide assistance with transfer out of bed and ambulation.  Medication Inspection Compliance: Jared Bass did not comply with our request to bring his pills to be counted. He was reminded that bringing the medication bottles, even when empty, is a requirement. Pill/Patch Count: None available to be counted. Bottle Appearance: No container available. Did not bring bottle(s) to appointment. Medication: None brought in. Filled Date: N/A Last Medication intake:  Ran out of medicine more than 48 hours ago

## 2016-08-09 NOTE — Progress Notes (Signed)
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 mind that statistics do not mean anything if you happen to be on the wrong side of them.  Even if a procedure has a 1 (one) in a 1,000,000 (million) chance of going wrong, it you happen to be that one..Also, keep in mind that by statistics, you have more of a chance of having something go wrong when taking medications.  Who should not have this procedure? If you are on a blood thinning medication (e.g. Coumadin, Plavix, see list of "Blood Thinners"), or if you have an active infection going on, you should not have the procedure.  If you are taking any blood thinners, please inform your physician.  How should I prepare for this procedure?  Do not eat or drink anything at least six hours prior to the procedure.  Bring a driver with you .  It  cannot be a taxi.  Come accompanied by an adult that can drive you back, and that is strong enough to help you if your legs get weak or numb from the local anesthetic.  Take all of your medicines the morning of  the procedure with just enough water to swallow them.  If you have diabetes, make sure that you are scheduled to have your procedure done first thing in the morning, whenever possible.  If you have diabetes, take only half of your insulin dose and notify our nurse that you have done so as soon as you arrive at the clinic.  If you are diabetic, but only take blood sugar pills (oral hypoglycemic), then do not take them on the morning of your procedure.  You may take them after you have had the procedure.  Do not take aspirin or any aspirin-containing medications, at least eleven (11) days prior to the procedure.  They may prolong bleeding.  Wear loose fitting clothing that may be easy to take off and that you would not mind if it got stained with Betadine or blood.  Do not wear any jewelry or perfume  Remove any nail coloring.  It will interfere with some of our monitoring equipment.  NOTE: Remember that this is not meant to be interpreted as a complete list of all possible complications.  Unforeseen problems may occur.  BLOOD THINNERS The following drugs contain aspirin or other products, which can cause increased bleeding during surgery and should not be taken for 2 weeks prior to and 1 week after surgery.  If you should need take something for relief of minor pain, you may take acetaminophen which is found in Tylenol,m Datril, Anacin-3 and Panadol. It is not blood thinner. The products listed below are.  Do not take any of the products listed below in addition to any listed on your instruction sheet.  A.P.C or A.P.C with Codeine Codeine Phosphate Capsules #3 Ibuprofen Ridaura  ABC compound Congesprin Imuran rimadil  Advil Cope Indocin Robaxisal  Alka-Seltzer Effervescent Pain Reliever and Antacid Coricidin or Coricidin-D  Indomethacin Rufen  Alka-Seltzer plus Cold Medicine Cosprin Ketoprofen S-A-C Tablets  Anacin Analgesic Tablets or Capsules Coumadin Korlgesic Salflex  Anacin Extra  Strength Analgesic tablets or capsules CP-2 Tablets Lanoril Salicylate  Anaprox Cuprimine Capsules Levenox Salocol  Anexsia-D Dalteparin Magan Salsalate  Anodynos Darvon compound Magnesium Salicylate Sine-off  Ansaid Dasin Capsules Magsal Sodium Salicylate  Anturane Depen Capsules Marnal Soma  APF Arthritis pain formula Dewitt's Pills Measurin Stanback  Argesic Dia-Gesic Meclofenamic Sulfinpyrazone  Arthritis Bayer Timed Release Aspirin Diclofenac Meclomen Sulindac  Arthritis pain formula Anacin Dicumarol Medipren Supac  Analgesic (Safety coated) Arthralgen Diffunasal Mefanamic Suprofen  Arthritis Strength Bufferin Dihydrocodeine Mepro Compound Suprol  Arthropan liquid Dopirydamole Methcarbomol with Aspirin Synalgos  ASA tablets/Enseals Disalcid Micrainin Tagament  Ascriptin Doan's Midol Talwin  Ascriptin A/D Dolene Mobidin Tanderil  Ascriptin Extra Strength Dolobid Moblgesic Ticlid  Ascriptin with Codeine Doloprin or Doloprin with Codeine Momentum Tolectin  Asperbuf Duoprin Mono-gesic Trendar  Aspergum Duradyne Motrin or Motrin IB Triminicin  Aspirin plain, buffered or enteric coated Durasal Myochrisine Trigesic  Aspirin Suppositories Easprin Nalfon Trillsate  Aspirin with Codeine Ecotrin Regular or Extra Strength Naprosyn Uracel  Atromid-S Efficin Naproxen Ursinus  Auranofin Capsules Elmiron Neocylate Vanquish  Axotal Emagrin Norgesic Verin  Azathioprine Empirin or Empirin with Codeine Normiflo Vitamin E  Azolid Emprazil Nuprin Voltaren  Bayer Aspirin plain, buffered or children's or timed BC Tablets or powders Encaprin Orgaran Warfarin Sodium  Buff-a-Comp Enoxaparin Orudis Zorpin  Buff-a-Comp with Codeine Equegesic Os-Cal-Gesic   Buffaprin Excedrin plain, buffered or Extra Strength Oxalid   Bufferin Arthritis Strength Feldene Oxphenbutazone   Bufferin plain or Extra Strength Feldene Capsules Oxycodone with Aspirin   Bufferin with Codeine Fenoprofen Fenoprofen Pabalate or  Pabalate-SF   Buffets II Flogesic Panagesic   Buffinol plain or Extra Strength Florinal or Florinal with Codeine Panwarfarin   Buf-Tabs Flurbiprofen Penicillamine   Butalbital Compound Four-way cold tablets Penicillin   Butazolidin Fragmin Pepto-Bismol   Carbenicillin Geminisyn Percodan   Carna Arthritis Reliever Geopen Persantine   Carprofen Gold's salt Persistin   Chloramphenicol Goody's Phenylbutazone   Chloromycetin Haltrain Piroxlcam   Clmetidine heparin Plaquenil   Cllnoril Hyco-pap Ponstel   Clofibrate Hydroxy chloroquine Propoxyphen         Before stopping any of these medications, be sure to consult the physician who ordered them.  Some, such as Coumadin (Warfarin) are ordered to prevent or treat serious conditions such as "deep thrombosis", "pumonary embolisms", and other heart problems.  The amount of time that you may need off of the medication may also vary with the medication and the reason for which you were taking it.  If you are taking any of these medications, please make sure you notify your pain physician before you undergo any procedures.         Epidural Steroid Injection Patient Information  Description: The epidural space surrounds the nerves as they exit the spinal cord.  In some patients, the nerves can be compressed and inflamed by a bulging disc or a tight spinal canal (spinal stenosis).  By injecting steroids into the epidural space, we can bring irritated nerves into direct contact with a potentially helpful medication.  These steroids act directly on the irritated nerves and can reduce swelling and inflammation which often leads to decreased pain.  Epidural steroids may be injected anywhere along the spine and from the neck to the low back depending upon the location of your pain.   After numbing the skin with local anesthetic (like Novocaine), a small needle is passed into the epidural space slowly.  You may experience a sensation of pressure while this is  being done.  The entire block usually last less than 10 minutes.  Conditions which may be treated by epidural steroids:   Low back and leg pain  Neck and arm pain  Spinal stenosis  Post-laminectomy syndrome  Herpes zoster (shingles) pain  Pain from compression fractures  Preparation for the injection:  1. Do not eat any solid food or dairy products within 8 hours of your appointment.  2. You may drink clear liquids up to 3 hours before appointment.  Clear liquids include water, black coffee, juice or soda.  No milk or cream please. 3. You may take your regular medication, including pain medications, with a sip of water before your appointment  Diabetics should hold regular insulin (if taken separately) and take 1/2 normal NPH dos the morning of the procedure.  Carry some sugar containing items with you to your appointment. 4. A driver must accompany you and be prepared to drive you home after your procedure.  5. Bring all your current medications with your. 6. An IV may be inserted and sedation may be given at the discretion of the physician.   7. A blood pressure cuff, EKG and other monitors will often be applied during the procedure.  Some patients may need to have extra oxygen administered for a short period.  8. You will be asked to provide medical information, including your allergies, prior to the procedure.  We must know immediately if you are taking blood thinners (like Coumadin/Warfarin)  Or if you are allergic to IV iodine contrast (dye). We must know if you could possible be pregnant.  Possible side-effects:  Bleeding from needle site  Infection (rare, may require surgery)  Nerve injury (rare)  Numbness & tingling (temporary)  Difficulty urinating (rare, temporary)  Spinal headache ( a headache worse with upright posture)  Light -headedness (temporary)  Pain at injection site (several days)  Decreased blood pressure (temporary)  Weakness in arm/leg  (temporary)  Pressure sensation in back/neck (temporary)  Call if you experience:  Fever/chills associated with headache or increased back/neck pain.  Headache worsened by an upright position.  New onset weakness or numbness of an extremity below the injection site  Hives or difficulty breathing (go to the emergency room)  Inflammation or drainage at the infection site  Severe back/neck pain  Any new symptoms which are concerning to you  Please note:  Although the local anesthetic injected can often make your back or neck feel good for several hours after the injection, the pain will likely return.  It takes 3-7 days for steroids to work in the epidural space.  You may not notice any pain relief for at least that one week.  If effective, we will often do a series of three injections spaced 3-6 weeks apart to maximally decrease your pain.  After the initial series, we generally will wait several months before considering a repeat injection of the same type.  If you have any questions, please call [phone] [facility]

## 2016-08-14 ENCOUNTER — Telehealth: Payer: Self-pay | Admitting: Pain Medicine

## 2016-08-14 ENCOUNTER — Telehealth: Payer: Self-pay

## 2016-08-14 NOTE — Telephone Encounter (Addendum)
Patient called to give number to [facility].  [phone]  He is going to call them and see if they will fax his records to our office, if not we cannot get them until his next visit. We do not have a signed consent. This is just an [place]

## 2016-08-14 NOTE — Telephone Encounter (Signed)
The patient called and wants [place] to get the information from his first surgery so Dr. Laban Emperor will know that he does not need procedures because his surgeon told him that nothing could be done. He wants Dr. Laban Emperor to know that procedures will not help at all, it cannot be fixed. He said the procedures sometimes make it worse. And he said if there were needles involved he wasn't coming back.  I spoke with this patient for 15 minutes trying to figure out exactly what he wanted me to tell Dr. Laban Emperor. He seemed upset. He said he keeps having to jump through hoops but he doesn't know what he has to gain by it. He said he is losing feeling to his right hand.He is scared and confused. He did keep interrupting me when I tried to get him to clarify his message. Maybe a nurse can call and get more information from him.

## 2016-08-15 LAB — TOXASSURE SELECT 13 (MW), URINE

## 2016-09-06 ENCOUNTER — Ambulatory Visit: Payer: Medicare Other | Admitting: Pain Medicine

## 2016-10-12 ENCOUNTER — Encounter: Payer: Self-pay | Admitting: Emergency Medicine

## 2016-10-12 ENCOUNTER — Emergency Department: Payer: Medicare Other

## 2016-10-12 ENCOUNTER — Inpatient Hospital Stay
Admission: EM | Admit: 2016-10-12 | Discharge: 2016-10-15 | DRG: 603 | Disposition: A | Discharge status: Home / Self Care | Payer: Medicare Other | Attending: Internal Medicine | Admitting: Internal Medicine

## 2016-10-12 DIAGNOSIS — L03116 Cellulitis of left lower limb: Secondary | ICD-10-CM | POA: Diagnosis not present

## 2016-10-12 DIAGNOSIS — R600 Localized edema: Secondary | ICD-10-CM | POA: Diagnosis not present

## 2016-10-12 DIAGNOSIS — T45516A Underdosing of anticoagulants, initial encounter: Secondary | ICD-10-CM | POA: Diagnosis present

## 2016-10-12 DIAGNOSIS — M7989 Other specified soft tissue disorders: Secondary | ICD-10-CM | POA: Diagnosis not present

## 2016-10-12 DIAGNOSIS — L02415 Cutaneous abscess of right lower limb: Secondary | ICD-10-CM | POA: Diagnosis present

## 2016-10-12 DIAGNOSIS — Z87828 Personal history of other (healed) physical injury and trauma: Secondary | ICD-10-CM | POA: Diagnosis not present

## 2016-10-12 DIAGNOSIS — I10 Essential (primary) hypertension: Secondary | ICD-10-CM | POA: Diagnosis present

## 2016-10-12 DIAGNOSIS — R2242 Localized swelling, mass and lump, left lower limb: Secondary | ICD-10-CM | POA: Diagnosis not present

## 2016-10-12 DIAGNOSIS — Z91128 Patient's intentional underdosing of medication regimen for other reason: Secondary | ICD-10-CM

## 2016-10-12 DIAGNOSIS — X58XXXA Exposure to other specified factors, initial encounter: Secondary | ICD-10-CM | POA: Diagnosis present

## 2016-10-12 DIAGNOSIS — Z8249 Family history of ischemic heart disease and other diseases of the circulatory system: Secondary | ICD-10-CM

## 2016-10-12 DIAGNOSIS — Y929 Unspecified place or not applicable: Secondary | ICD-10-CM

## 2016-10-12 DIAGNOSIS — Z825 Family history of asthma and other chronic lower respiratory diseases: Secondary | ICD-10-CM

## 2016-10-12 DIAGNOSIS — L0390 Cellulitis, unspecified: Secondary | ICD-10-CM | POA: Diagnosis present

## 2016-10-12 DIAGNOSIS — R609 Edema, unspecified: Secondary | ICD-10-CM | POA: Diagnosis present

## 2016-10-12 DIAGNOSIS — I2699 Other pulmonary embolism without acute cor pulmonale: Secondary | ICD-10-CM | POA: Diagnosis not present

## 2016-10-12 DIAGNOSIS — Z91048 Other nonmedicinal substance allergy status: Secondary | ICD-10-CM

## 2016-10-12 DIAGNOSIS — Z981 Arthrodesis status: Secondary | ICD-10-CM

## 2016-10-12 DIAGNOSIS — S8012XA Contusion of left lower leg, initial encounter: Secondary | ICD-10-CM | POA: Diagnosis present

## 2016-10-12 DIAGNOSIS — M791 Myalgia: Secondary | ICD-10-CM | POA: Diagnosis present

## 2016-10-12 DIAGNOSIS — Z86711 Personal history of pulmonary embolism: Secondary | ICD-10-CM

## 2016-10-12 DIAGNOSIS — Z7901 Long term (current) use of anticoagulants: Secondary | ICD-10-CM

## 2016-10-12 DIAGNOSIS — Z885 Allergy status to narcotic agent status: Secondary | ICD-10-CM

## 2016-10-12 DIAGNOSIS — L0291 Cutaneous abscess, unspecified: Secondary | ICD-10-CM | POA: Diagnosis present

## 2016-10-12 DIAGNOSIS — Z79899 Other long term (current) drug therapy: Secondary | ICD-10-CM

## 2016-10-12 DIAGNOSIS — S80812A Abrasion, left lower leg, initial encounter: Secondary | ICD-10-CM | POA: Diagnosis not present

## 2016-10-12 DIAGNOSIS — Y9375 Activity, martial arts: Secondary | ICD-10-CM

## 2016-10-12 DIAGNOSIS — I4891 Unspecified atrial fibrillation: Secondary | ICD-10-CM | POA: Diagnosis present

## 2016-10-12 LAB — COMPREHENSIVE METABOLIC PANEL
ALBUMIN: 4 g/dL (ref 3.5–5.0)
ALK PHOS: 71 U/L (ref 38–126)
ALT: 61 U/L (ref 17–63)
ANION GAP: 6 (ref 5–15)
AST: 67 U/L — ABNORMAL HIGH (ref 15–41)
BUN: 10 mg/dL (ref 6–20)
CALCIUM: 9.1 mg/dL (ref 8.9–10.3)
CHLORIDE: 103 mmol/L (ref 101–111)
CO2: 29 mmol/L (ref 22–32)
Creatinine, Ser: 0.72 mg/dL (ref 0.61–1.24)
GFR calc Af Amer: >60 mL/min (ref >60)
GFR calc non Af Amer: >60 mL/min (ref >60)
GLUCOSE: 104 mg/dL — AB (ref 65–99)
Potassium: 4.5 mmol/L (ref 3.5–5.1)
SODIUM: 138 mmol/L (ref 135–145)
Total Bilirubin: 0.6 mg/dL (ref 0.3–1.2)
Total Protein: 7.6 g/dL (ref 6.5–8.1)

## 2016-10-12 LAB — CBC
HCT: 46.8 % (ref 40.0–52.0)
HEMOGLOBIN: 15.9 g/dL (ref 13.0–18.0)
MCH: 31.3 pg (ref 26.0–34.0)
MCHC: 34 g/dL (ref 32.0–36.0)
MCV: 92 fL (ref 80.0–100.0)
PLATELETS: 214 10*3/uL (ref 150–440)
RBC: 5.09 MIL/uL (ref 4.40–5.90)
RDW: 14.2 % (ref 11.5–14.5)
WBC: 7.4 10*3/uL (ref 3.8–10.6)

## 2016-10-12 MED ORDER — SODIUM CHLORIDE 0.9 % IV BOLUS (SEPSIS)
1000.0000 mL | Freq: Once | INTRAVENOUS | Status: AC
Start: 2016-10-12 — End: 2016-10-12
  Administered 2016-10-12: 1000 mL via INTRAVENOUS

## 2016-10-12 MED ORDER — SODIUM CHLORIDE 0.9 % IV SOLN: 250.0000 mL | INTRAVENOUS | Status: DC | PRN

## 2016-10-12 MED ORDER — SODIUM CHLORIDE 0.9% FLUSH: 3.0000 mL | INTRAVENOUS | Status: DC | PRN

## 2016-10-12 MED ORDER — CLINDAMYCIN PHOSPHATE 600 MG/50ML IV SOLN
600.0000 mg | Freq: Once | INTRAVENOUS | Status: AC
  Administered 2016-10-12: 600 mg via INTRAVENOUS
  Filled 2016-10-12: qty 50

## 2016-10-12 MED ORDER — TIZANIDINE HCL 2 MG PO TABS: 2.0000 mg | ORAL_TABLET | Freq: Three times a day (TID) | ORAL | Status: DC | PRN

## 2016-10-12 MED ORDER — SODIUM CHLORIDE 0.9% FLUSH
3.0000 mL | Freq: Two times a day (BID) | INTRAVENOUS | Status: DC
  Administered 2016-10-13 – 2016-10-15 (×6): 3 mL via INTRAVENOUS

## 2016-10-12 MED ORDER — ACETAMINOPHEN 325 MG PO TABS: 650.0000 mg | ORAL_TABLET | Freq: Four times a day (QID) | ORAL | Status: DC | PRN

## 2016-10-12 MED ORDER — GABAPENTIN 400 MG PO CAPS
800.0000 mg | ORAL_CAPSULE | Freq: Four times a day (QID) | ORAL | Status: DC
  Administered 2016-10-13 – 2016-10-15 (×9): 800 mg via ORAL
  Filled 2016-10-12 (×9): qty 2

## 2016-10-12 MED ORDER — ACETAMINOPHEN 650 MG RE SUPP: 650.0000 mg | Freq: Four times a day (QID) | RECTAL | Status: DC | PRN

## 2016-10-12 MED ORDER — CLINDAMYCIN PHOSPHATE 600 MG/50ML IV SOLN
600.0000 mg | Freq: Three times a day (TID) | INTRAVENOUS | Status: DC
  Administered 2016-10-13 – 2016-10-15 (×7): 600 mg via INTRAVENOUS
  Filled 2016-10-12 (×9): qty 50

## 2016-10-12 MED ORDER — ENOXAPARIN SODIUM 100 MG/ML ~~LOC~~ SOLN
1.0000 mg/kg | Freq: Two times a day (BID) | SUBCUTANEOUS | Status: DC
  Administered 2016-10-13 (×2): 100 mg via SUBCUTANEOUS
  Filled 2016-10-12 (×4): qty 1

## 2016-10-12 MED ORDER — PROPRANOLOL HCL 20 MG PO TABS
10.0000 mg | ORAL_TABLET | Freq: Three times a day (TID) | ORAL | Status: DC
  Administered 2016-10-13 – 2016-10-15 (×8): 10 mg via ORAL
  Filled 2016-10-12 (×8): qty 1

## 2016-10-12 MED ORDER — OXYCODONE-ACETAMINOPHEN 5-325 MG PO TABS
1.0000 | ORAL_TABLET | Freq: Once | ORAL | Status: AC
  Administered 2016-10-12: 1 via ORAL
  Filled 2016-10-12: qty 1

## 2016-10-12 NOTE — ED Provider Notes (Signed)
[facility] Emergency Department Provider Note  ____________________________________________  Time seen: Approximately 8:30 PM  I have reviewed the triage vital signs and the nursing notes.   HISTORY  Chief Complaint Cellulitis    HPI Jared Bass is a 46 y.o. male with a history of PE and A. fib on Xarelto presenting with left lower extremity bruising, erythema, pain. The patient reports that he was doing a jujitsu move with a partner who hit him in the left ankle. Afterwards, he developed pain with internal rotation of the ankle associated with a large amount of bruising on the medial aspect of the left lower extremity and throughout the foot area did over the past few days, he has developed associated erythema. He has been self discontinuing his Xarelto "because I don't want to be on it." He denies any systemic symptoms including fever, chills, nausea or vomiting.   Past Medical History:  Diagnosis Date  . Acute renal failure (HCC)   . Acute respiratory failure (HCC)    2017  . Acute respiratory failure with hypoxia (HCC)   . Acute saddle pulmonary embolism with acute cor pulmonale (HCC)   . Chronic neck pain   . Closed fracture of nasal bone   . DDD (degenerative disc disease), cervical 05/04/2015  . Faintness   . Hemoptysis   . History of atrial fibrillation   . History of head injury 05/04/2015  . History of tachycardia 05/04/2015  . Hypertension   . Hypotension   . Pulmonary emboli (HCC)    on elequis  . Pulmonary embolism (HCC)   . Right eye injury   . Saddle pulmonary embolus (HCC) 12/13/2015  . Syncope and collapse   . Tachycardia 12/20/2015    Patient Active Problem List   Diagnosis Date Noted  . Chronic pain syndrome 08/09/2016  . Vitamin D insufficiency 12/19/2015  . Pulmonary hypertension   . Opioid-induced constipation 11/30/2015  . Chronic upper extremity pain (Location of Primary Source of Pain) (Bilateral) (R>L) 08/31/2015  .  Radicular pain of shoulder (Right) 08/31/2015  . Chronic neck pain (Location of Secondary source of pain) (Bilateral) (R>L) 05/04/2015  . Cervical spondylosis 05/04/2015  . 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 Long term current use of opiate analgesic 05/04/2015  . Long term prescription opiate use 05/04/2015  . Opiate use (37.5 MME/Day) 05/04/2015  . Opiate dependence (HCC) 05/04/2015  . Encounter for therapeutic drug level monitoring 05/04/2015  . Neurogenic pain 05/04/2015  . Chronic cervical radicular pain (Location of Primary Source of Pain) (Bilateral) (R>L) (C6 and C7 Dermatome) 05/04/2015  . Chronic shoulder pain (Right) 05/04/2015  . Chronic elbow pain (Right) 05/04/2015  . Cervical spinal stenosis 05/04/2015  . Cervical foraminal stenosis (right side) 05/04/2015  . Myofascial pain 05/04/2015  . Muscle spasms of neck 05/04/2015  . History of tachycardia 05/04/2015  . Essential hypertension 05/04/2015  . History of head injury 05/04/2015  . Generalized anxiety disorder 05/04/2015  . Hyperlipidemia 05/04/2015  . MACULAR DEGENERATION 12/29/2007  . Status post C6-7 ACDF 10/31/2006    Past Surgical History:  Procedure Laterality Date  . neck fusion     c5-7  . TONSILLECTOMY      Current Outpatient Rx  . Order #: [id number] Class: Normal  . Order #: [id number] Class: Normal  . Order #: [id number] Class: Historical Med  . Order #: [id number] Class: Print  . Order #: [id number] Class: Historical Med  . Order #: [id number] Class: Normal    Allergies Tramadol  and Other  Family History  Problem Relation Age of Onset  . Hypertension Mother   . COPD Mother   . Mesothelioma Father   . COPD Father   . Peripheral vascular disease Brother     Clots in his arteries surgically removed    Social History Social History  Substance Use Topics  . Smoking status: Never Smoker  . Smokeless tobacco: Never Used  . Alcohol use No    Review of  Systems Constitutional: No fever/chills.No lightheadedness or syncope. Eyes: No visual changes. ENT: No sore throat. No congestion or rhinorrhea. Cardiovascular: Denies chest pain. Denies palpitations. Respiratory: Denies shortness of breath.  No cough. Gastrointestinal: No abdominal pain.  No nausea, no vomiting.  No diarrhea.  No constipation. Genitourinary: Negative for dysuria. Musculoskeletal: Negative for back pain. Positive left ankle pain. Skin: Positive for ecchymosis and rash. Neurological: Negative for headaches. No focal numbness, tingling or weakness. Normal gait, able to bear weight.  10-point ROS otherwise negative.  ____________________________________________   PHYSICAL EXAM:  VITAL SIGNS: ED Triage Vitals  Enc Vitals Group     BP 10/12/16 1729 (!) 153/97     Pulse Rate 10/12/16 1729 94     Resp 10/12/16 1729 18     Temp 10/12/16 1729 98.5 F (36.9 C)     Temp Source 10/12/16 1729 Oral     SpO2 10/12/16 1729 98 %     Weight 10/12/16 1731 220 lb (99.8 kg)     Height 10/12/16 1731 5\' 8"  (1.727 m)     Head Circumference --      Peak Flow --      Pain Score 10/12/16 1731 8     Pain Loc --      Pain Edu? --      Excl. in GC? --     Constitutional: Alert and oriented. Chronically ill appearing but in no acute distress. Answers questions appropriately. Eyes: Conjunctivae are normal.  EOMI. No scleral icterus. Head: Atraumatic. Nose: No congestion/rhinnorhea. Mouth/Throat: Mucous membranes are moist. Poor dentition diffusely. Neck: No stridor.  Supple.  No JVD. No meningismus. Cardiovascular: Normal rate, regular rhythm. No murmurs, rubs or gallops.  Respiratory: Normal respiratory effort.  No accessory muscle use or retractions. Lungs CTAB.  No wheezes, rales or ronchi. Musculoskeletal: The patient has a superficial abrasion on the lateral aspect of the left tibia just below the knee. In addition, he has an area of ecchymosis and erythema that is approximately  8 x 6" on the medial aspect of the left lower extremity just above the ankle. In the center distal part of this erythema he has an area of fluctuance without significant pain that is approximately 5 x 3 cm. The patient does also have some bruising in the medial aspect of the foot. He has normal DP and PT pulses. 5 out of 5 dorsiflexion and plantar flexion. He has normal sensation to light touch in the left lower extremity. Neurologic:  A&Ox3.  Speech is clear.  Face and smile are symmetric.  EOMI.  Moves all extremities well. Skin:  See above Psychiatric: Mood and affect are normal. Speech and behavior are normal.  Normal judgement.  ____________________________________________   LABS (all labs ordered are listed, but only abnormal results are displayed)  Labs Reviewed  COMPREHENSIVE METABOLIC PANEL - Abnormal; Notable for the following:       Result Value   Glucose, Bld 104 (*)    AST 67 (*)    All other components within  normal limits  CBC   ____________________________________________  EKG  Not indicated ____________________________________________  RADIOLOGY  US Venous Img Lower Unilateral Left  Result Date: 10/12/2016 CLINICAL DATA:  LEFT lower extremity swelling for 1 week. EXAM: LEFT LOWER EXTREMITY VENOUS DOPPLER ULTRASOUND TECHNIQUE: Gray-scale sonography with graded compression, as well as color Doppler and duplex ultrasound were performed to evaluate the lower extremity deep venous systems from the level of the common femoral vein and including the common femoral, femoral, profunda femoral, popliteal and calf veins including the posterior tibial, peroneal and gastrocnemius veins when visible. The superficial great saphenous vein was also interrogated. Spectral Doppler was utilized to evaluate flow at rest and with distal augmentation maneuvers in the common femoral, femoral and popliteal veins. COMPARISON:  None. FINDINGS: Contralateral Common Femoral Vein: Respiratory  phasicity is normal and symmetric with the symptomatic side. No evidence of thrombus. Normal compressibility. Common Femoral Vein: No evidence of thrombus. Normal compressibility, respiratory phasicity and response to augmentation. Profunda Femoral Vein: No evidence of thrombus. Normal compressibility and flow on color Doppler imaging. Femoral Vein: No evidence of thrombus. Normal compressibility, respiratory phasicity and response to augmentation. Popliteal Vein: No evidence of thrombus. Normal compressibility, respiratory phasicity and response to augmentation. Calf Veins: No evidence of thrombus. Normal compressibility and flow on color Doppler imaging. Superficial Great Saphenous Vein: No evidence of thrombus. Normal compressibility and flow on color Doppler imaging. Other Findings: 5.1 x 1 x 2.5 cm complex collection superficial soft tissues at the level of the ankle. IMPRESSION: No evidence of LEFT deep venous thrombosis. 5.1 x 1 x 2.5 cm superficial ankle fluid collection could represent abscess or hematoma. Electronically Signed   By: Awilda Metro M.D.   On: 10/12/2016 19:31    ____________________________________________   PROCEDURES  Procedure(s) performed: None  Procedures  Critical Care performed: No ____________________________________________   INITIAL IMPRESSION / ASSESSMENT AND PLAN / ED COURSE  Pertinent labs & imaging results that were available during my care of the patient were reviewed by me and considered in my medical decision making (see chart for details).  46 y.o. male, nondiabetic, on subtherapeutic dosing of Xarelto presenting with bruising, swelling, erythema and pain in the left lower extremity after traumatic episode. I am concerned that the patient has cellulitis, and he also has a fluid collection just above the medial malleolus that is seen on my examination as well as ultrasound. The patient has very little tenderness associated with this so hematoma or  serous fluid collection is much more likely than an abscess. The patient also does have significant ecchymosis of the left lower 70. I plan will be to admit the patient for IV ensure biotic's and continued monitoring of the fluid collection. At this time, incision and drainage is not indicated but this area needs to be closely monitored.  ____________________________________________  FINAL CLINICAL IMPRESSION(S) / ED DIAGNOSES  Final diagnoses:  Swelling  Cellulitis of left lower extremity  Fluid collection (edema) in the arms, legs, hands and feet         NEW MEDICATIONS STARTED DURING THIS VISIT:  New Prescriptions   No medications on file      Rockne Menghini, MD 10/12/16 2035

## 2016-10-12 NOTE — ED Notes (Signed)
Report to [facility], RN 1A.

## 2016-10-12 NOTE — ED Triage Notes (Signed)
Pt with red, hot, swollen area to left ankle x1 week, also with superficial bruising and swelling to left leg x1 week. Pt reports he has been on eliquis but has been "weaning myself off" for the past month.

## 2016-10-12 NOTE — ED Notes (Signed)
Pt states area appeared as a rash last Thursday, progressively worse since. Bruising present, cellulitits to left lower leg to toes.

## 2016-10-12 NOTE — H&P (Signed)
Jared Bass is an 46 y.o. male.   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 Shortly after he noticed some pain in his left leg just above the ankle. A few days later it started looking bruised in nature but that coloration started improving and then he started getting swelling and erythema. He was on anticoagulation for a saddle embolism but said after his hypercoagulability panel was negative his doctor told him he could stop his Elavil plus.  Past Medical History:  Diagnosis Date  . Acute renal failure ([place])   . Acute respiratory failure ([place])    2017  . Acute respiratory failure with hypoxia ([place])   . Acute saddle pulmonary embolism with acute cor pulmonale (HCC)   . Chronic neck pain   . Closed fracture of nasal bone   . DDD (degenerative disc disease), cervical 05/04/2015  . Faintness   . Hemoptysis   . History of atrial fibrillation   . History of head injury 05/04/2015  . History of tachycardia 05/04/2015  . Hypertension   . Hypotension   . Pulmonary emboli ([place])    on elequis  . Pulmonary embolism ([place])   . Right eye injury   . Saddle pulmonary embolus ([place]) 12/13/2015  . Syncope and collapse   . Tachycardia 12/20/2015    Past Surgical History:  Procedure Laterality Date  . neck fusion     c5-7  . TONSILLECTOMY      Family History  Problem Relation Age of Onset  . Hypertension Mother   . COPD Mother   . Mesothelioma Father   . COPD Father   . Peripheral vascular disease Brother     Clots in his arteries surgically removed   Social History:  reports that he has never smoked. He has never used smokeless tobacco. He reports that he does not drink alcohol or use drugs.  Allergies:  Allergies  Allergen Reactions  . Tramadol Anaphylaxis and Rash  . Other Other (See Comments)    Rembrant Whitening strips  "fat lips"     (Not in a hospital admission)  Results for  orders placed or performed during the hospital encounter of 10/12/16 (from the past 48 hour(s))  CBC     Status: None   Collection Time: 10/12/16  5:29 PM  Result Value Ref Range   WBC 7.4 3.8 - 10.6 K/uL   RBC 5.09 4.40 - 5.90 MIL/uL   Hemoglobin 15.9 13.0 - 18.0 g/dL   HCT 46.8 40.0 - 52.0 %   MCV 92.0 80.0 - 100.0 fL   MCH 31.3 26.0 - 34.0 pg   MCHC 34.0 32.0 - 36.0 g/dL   RDW 14.2 11.5 - 14.5 %   Platelets 214 150 - 440 K/uL  Comprehensive metabolic panel     Status: Abnormal   Collection Time: 10/12/16  5:29 PM  Result Value Ref Range   Sodium 138 135 - 145 mmol/L   Potassium 4.5 3.5 - 5.1 mmol/L   Chloride 103 101 - 111 mmol/L   CO2 29 22 - 32 mmol/L   Glucose, Bld 104 (H) 65 - 99 mg/dL   BUN 10 6 - 20 mg/dL   Creatinine, Ser 0.72 0.61 - 1.24 mg/dL   Calcium 9.1 8.9 - 10.3 mg/dL   Total Protein 7.6 6.5 - 8.1 g/dL   Albumin 4.0 3.5 - 5.0 g/dL   AST 67 (H) 15 -  41 U/L   ALT 61 17 - 63 U/L   Alkaline Phosphatase 71 38 - 126 U/L   Total Bilirubin 0.6 0.3 - 1.2 mg/dL   GFR calc non Af Amer >60 >60 mL/min   GFR calc Af Amer >60 >60 mL/min    Comment: (NOTE) The eGFR has been calculated using the CKD EPI equation. This calculation has not been validated in all clinical situations. eGFR's persistently <60 mL/min signify possible Chronic Kidney Disease.    Anion gap 6 5 - 15   Dg Ankle Complete Left  Result Date: 10/12/2016 CLINICAL DATA:  Left ankle pain, erythema and swelling for 1 week. No reported injury. EXAM: LEFT ANKLE COMPLETE - 3+ VIEW COMPARISON:  None. FINDINGS: Diffuse left ankle soft tissue swelling. No fracture or subluxation. No cortical erosions or periosteal reaction. No suspicious focal osseous lesion. No appreciable arthropathy. No radiopaque foreign body. IMPRESSION: Diffuse left ankle soft tissue swelling. No fracture, malalignment or specific radiographic findings of osteomyelitis. Electronically Signed   By: Ilona Sorrel M.D.   On: 10/12/2016 20:42   US  Venous Img Lower Unilateral Left  Result Date: 10/12/2016 CLINICAL DATA:  LEFT lower extremity swelling for 1 week. EXAM: LEFT LOWER EXTREMITY VENOUS DOPPLER ULTRASOUND TECHNIQUE: Gray-scale sonography with graded compression, as well as color Doppler and duplex ultrasound were performed to evaluate the lower extremity deep venous systems from the level of the common femoral vein and including the common femoral, femoral, profunda femoral, popliteal and calf veins including the posterior tibial, peroneal and gastrocnemius veins when visible. The superficial great saphenous vein was also interrogated. Spectral Doppler was utilized to evaluate flow at rest and with distal augmentation maneuvers in the common femoral, femoral and popliteal veins. COMPARISON:  None. FINDINGS: Contralateral Common Femoral Vein: Respiratory phasicity is normal and symmetric with the symptomatic side. No evidence of thrombus. Normal compressibility. Common Femoral Vein: No evidence of thrombus. Normal compressibility, respiratory phasicity and response to augmentation. Profunda Femoral Vein: No evidence of thrombus. Normal compressibility and flow on color Doppler imaging. Femoral Vein: No evidence of thrombus. Normal compressibility, respiratory phasicity and response to augmentation. Popliteal Vein: No evidence of thrombus. Normal compressibility, respiratory phasicity and response to augmentation. Calf Veins: No evidence of thrombus. Normal compressibility and flow on color Doppler imaging. Superficial Great Saphenous Vein: No evidence of thrombus. Normal compressibility and flow on color Doppler imaging. Other Findings: 5.1 x 1 x 2.5 cm complex collection superficial soft tissues at the level of the ankle. IMPRESSION: No evidence of LEFT deep venous thrombosis. 5.1 x 1 x 2.5 cm superficial ankle fluid collection could represent abscess or hematoma. Electronically Signed   By: Elon Alas M.D.   On: 10/12/2016 19:31    Review  of Systems  Constitutional: Negative for chills and fever.  HENT: Negative for hearing loss.   Eyes: Negative for blurred vision.  Respiratory: Negative for cough.   Cardiovascular: Negative for chest pain.  Gastrointestinal: Negative for heartburn and nausea.  Genitourinary: Negative for dysuria.  Musculoskeletal: Positive for joint pain.  Skin: Negative for rash.  Neurological: Negative for dizziness.  Endo/Heme/Allergies: Does not bruise/bleed easily.    Blood pressure (!) 153/97, pulse 94, temperature 98.5 F (36.9 C), temperature source Oral, resp. rate 18, height '5\' 8"'  (1.727 m), weight 99.8 kg (220 lb), SpO2 98 %. Physical Exam  Constitutional: He is oriented to person, place, and time. He appears well-developed and well-nourished. No distress.  HENT:  Head: Normocephalic and atraumatic.  Mouth/Throat:  Oropharynx is clear and moist.  Eyes: Pupils are equal, round, and reactive to light. No scleral icterus.  Neck: Neck supple. No JVD present. No tracheal deviation present. No thyromegaly present.  Cardiovascular: Normal rate and regular rhythm.   No murmur heard. Respiratory: Effort normal and breath sounds normal. No respiratory distress. He exhibits no tenderness.  GI: Soft. Bowel sounds are normal. He exhibits no distension and no mass. There is no tenderness. There is no rebound and no guarding.  Musculoskeletal: Normal range of motion. He exhibits edema. He exhibits no tenderness.  Left lower extremities erythematous and warm to touch. Some mild ecchymosis.  Neurological: He is alert and oriented to person, place, and time.     Assessment/Plan 1. Cellulitis left lower extremity. When started him on clindamycin. Ultrasound done in the ER shows a small fluid collection around doubtful this point and that's abscess as a fillet would've declared itself by now. He has no fever or white count. We will IV clindamycin observed overnight. If we don't see dramatic improvement  tomorrow then may consider getting surgical consult for potential drainage. 2. History of pulmonary embolism. He had a pretty severe saddle embolism late last year. He underwent hypercoagulability workup by Dr. Grayland Ormond at the cancer center. The patient tells me he was told he could stop his anticoagulation. Her last night 5 not Grayland Ormond is he recommended lifelong anticoagulation. Patient is not wanting to restart his anticoagulation. However I did discuss some of this with him and I will put him on full dose Lovenox this evening and this will need to be further discussed with him before discharge. 3. History of atrial fibrillation. He said he is on propanolol for this. He tells me he didn't know anything about anticoagulation for this. He sounds normal sinus rhythm on exam but has not had an EKG done. I would order an EKG and this can be further addressed tomorrow.  Total time spent 40 minutes  Baxter Hire, MD 10/12/2016, 9:13 PM

## 2016-10-13 ENCOUNTER — Encounter: Payer: Self-pay | Admitting: *Deleted

## 2016-10-13 DIAGNOSIS — Y9375 Activity, martial arts: Secondary | ICD-10-CM | POA: Diagnosis not present

## 2016-10-13 DIAGNOSIS — S80812A Abrasion, left lower leg, initial encounter: Secondary | ICD-10-CM | POA: Diagnosis present

## 2016-10-13 DIAGNOSIS — Z981 Arthrodesis status: Secondary | ICD-10-CM | POA: Diagnosis not present

## 2016-10-13 DIAGNOSIS — I10 Essential (primary) hypertension: Secondary | ICD-10-CM | POA: Diagnosis present

## 2016-10-13 DIAGNOSIS — I2699 Other pulmonary embolism without acute cor pulmonale: Secondary | ICD-10-CM | POA: Diagnosis not present

## 2016-10-13 DIAGNOSIS — L0390 Cellulitis, unspecified: Secondary | ICD-10-CM | POA: Diagnosis not present

## 2016-10-13 DIAGNOSIS — L03116 Cellulitis of left lower limb: Secondary | ICD-10-CM | POA: Diagnosis present

## 2016-10-13 DIAGNOSIS — Z87828 Personal history of other (healed) physical injury and trauma: Secondary | ICD-10-CM | POA: Diagnosis not present

## 2016-10-13 DIAGNOSIS — L02415 Cutaneous abscess of right lower limb: Secondary | ICD-10-CM | POA: Diagnosis present

## 2016-10-13 DIAGNOSIS — Z86711 Personal history of pulmonary embolism: Secondary | ICD-10-CM | POA: Diagnosis not present

## 2016-10-13 DIAGNOSIS — I4891 Unspecified atrial fibrillation: Secondary | ICD-10-CM | POA: Diagnosis present

## 2016-10-13 DIAGNOSIS — X58XXXA Exposure to other specified factors, initial encounter: Secondary | ICD-10-CM | POA: Diagnosis present

## 2016-10-13 DIAGNOSIS — Z825 Family history of asthma and other chronic lower respiratory diseases: Secondary | ICD-10-CM | POA: Diagnosis not present

## 2016-10-13 DIAGNOSIS — Z91048 Other nonmedicinal substance allergy status: Secondary | ICD-10-CM | POA: Diagnosis not present

## 2016-10-13 DIAGNOSIS — Y929 Unspecified place or not applicable: Secondary | ICD-10-CM | POA: Diagnosis not present

## 2016-10-13 DIAGNOSIS — Z885 Allergy status to narcotic agent status: Secondary | ICD-10-CM | POA: Diagnosis not present

## 2016-10-13 DIAGNOSIS — Z7901 Long term (current) use of anticoagulants: Secondary | ICD-10-CM | POA: Diagnosis not present

## 2016-10-13 DIAGNOSIS — Z79899 Other long term (current) drug therapy: Secondary | ICD-10-CM | POA: Diagnosis not present

## 2016-10-13 DIAGNOSIS — Z8249 Family history of ischemic heart disease and other diseases of the circulatory system: Secondary | ICD-10-CM | POA: Diagnosis not present

## 2016-10-13 DIAGNOSIS — R609 Edema, unspecified: Secondary | ICD-10-CM | POA: Diagnosis present

## 2016-10-13 DIAGNOSIS — L0291 Cutaneous abscess, unspecified: Secondary | ICD-10-CM | POA: Diagnosis not present

## 2016-10-13 DIAGNOSIS — Z91128 Patient's intentional underdosing of medication regimen for other reason: Secondary | ICD-10-CM | POA: Diagnosis not present

## 2016-10-13 DIAGNOSIS — M791 Myalgia: Secondary | ICD-10-CM | POA: Diagnosis present

## 2016-10-13 DIAGNOSIS — S8012XA Contusion of left lower leg, initial encounter: Secondary | ICD-10-CM | POA: Diagnosis present

## 2016-10-13 DIAGNOSIS — T45516A Underdosing of anticoagulants, initial encounter: Secondary | ICD-10-CM | POA: Diagnosis present

## 2016-10-13 MED ORDER — OXYCODONE-ACETAMINOPHEN 5-325 MG PO TABS
1.0000 | ORAL_TABLET | Freq: Four times a day (QID) | ORAL | Status: DC | PRN
  Administered 2016-10-13 (×2): 1 via ORAL
  Administered 2016-10-13 – 2016-10-15 (×5): 2 via ORAL
  Filled 2016-10-13 (×2): qty 2
  Filled 2016-10-13: qty 1
  Filled 2016-10-13 (×4): qty 2
  Filled 2016-10-13: qty 1

## 2016-10-13 MED ORDER — MORPHINE SULFATE (PF) 2 MG/ML IV SOLN
2.0000 mg | INTRAVENOUS | Status: DC | PRN
  Administered 2016-10-13 – 2016-10-14 (×3): 2 mg via INTRAVENOUS
  Filled 2016-10-13 (×3): qty 1

## 2016-10-13 MED ORDER — KETOROLAC TROMETHAMINE 30 MG/ML IJ SOLN
30.0000 mg | Freq: Four times a day (QID) | INTRAMUSCULAR | Status: DC | PRN
  Administered 2016-10-13: 30 mg via INTRAVENOUS
  Filled 2016-10-13: qty 1

## 2016-10-13 NOTE — Progress Notes (Signed)
Pt complaining of pain 8/10 needing orders for pain medication. Spoke with Dr. Emmit Pomfret MD to place orders.

## 2016-10-13 NOTE — Progress Notes (Addendum)
[facility] - [place] at [facility]   PATIENT NAME: Jared Bass    MR#:  [id number]  DATE OF BIRTH:  18-Feb-1971  Came in with pain over the left ankle for last few days saw some redness admitted with cellulitis REVIEW OF SYSTEMS:   Review of Systems  Constitutional: Negative for chills, fever and weight loss.  HENT: Negative for ear discharge, ear pain and nosebleeds.   Eyes: Negative for blurred vision, pain and discharge.  Respiratory: Negative for sputum production, shortness of breath, wheezing and stridor.   Cardiovascular: Negative for chest pain, palpitations, orthopnea and PND.  Gastrointestinal: Negative for abdominal pain, diarrhea, nausea and vomiting.  Genitourinary: Negative for frequency and urgency.  Musculoskeletal: Positive for joint pain. Negative for back pain.  Neurological: Negative for sensory change, speech change, focal weakness and weakness.  Psychiatric/Behavioral: Negative for depression and hallucinations. The patient is not nervous/anxious.    DRUG ALLERGIES:   Allergies  Allergen Reactions  . Tramadol Anaphylaxis and Rash  . Other Other (See Comments)    Rembrant Whitening strips  "fat lips"    VITALS:  Blood pressure (!) 134/93, pulse 81, temperature 97.8 F (36.6 C), temperature source Oral, resp. rate 18, height 5\' 8"  (1.727 m), weight 102.4 kg (225 lb 12.8 oz), SpO2 98 %.  PHYSICAL EXAMINATION:   Physical Exam  GENERAL:  46 y.o.-year-old patient lying in the bed with no acute distress.  EYES: Pupils equal, round, reactive to light and accommodation. No scleral icterus. Extraocular muscles intact.  HEENT: Head atraumatic, normocephalic. Oropharynx and nasopharynx clear.  NECK:  Supple, no jugular venous distention. No thyroid enlargement, no tenderness.  LUNGS: Normal breath sounds bilaterally, no wheezing, rales, rhonchi. No use of accessory muscles of respiration.  CARDIOVASCULAR: S1, S2 normal. No murmurs, rubs,  or gallops.  ABDOMEN: Soft, nontender, nondistendedLeft lower extremity so that is improving. Redness improved from the demarcated area. Localized redness around the above the left ankle tender to touch  No cyanosis, clubbing or edema b/l.    NEUROLOGIC: Cranial nerves II through XII are intact. No focal Motor or sensory deficits b/l.   PSYCHIATRIC:  patient is alert and oriented x 3.  SKIN: No obvious rash, lesion, or ulcer.   LABORATORY PANEL:  CBC  Recent Labs Lab 10/12/16 1729  WBC 7.4  HGB 15.9  HCT 46.8  PLT 214    Chemistries   Recent Labs Lab 10/12/16 1729  NA 138  K 4.5  CL 103  CO2 29  GLUCOSE 104*  BUN 10  CREATININE 0.72  CALCIUM 9.1  AST 67*  ALT 61  ALKPHOS 71  BILITOT 0.6   Cardiac Enzymes No results for input(s): TROPONINI in the last 168 hours. RADIOLOGY:  Dg Ankle Complete Left  Result Date: 10/12/2016 CLINICAL DATA:  Left ankle pain, erythema and swelling for 1 week. No reported injury. EXAM: LEFT ANKLE COMPLETE - 3+ VIEW COMPARISON:  None. FINDINGS: Diffuse left ankle soft tissue swelling. No fracture or subluxation. No cortical erosions or periosteal reaction. No suspicious focal osseous lesion. No appreciable arthropathy. No radiopaque foreign body. IMPRESSION: Diffuse left ankle soft tissue swelling. No fracture, malalignment or specific radiographic findings of osteomyelitis. Electronically Signed   By: Delbert Phenix M.D.   On: 10/12/2016 20:42   US Venous Img Lower Unilateral Left  Result Date: 10/12/2016 CLINICAL DATA:  LEFT lower extremity swelling for 1 week. EXAM: LEFT LOWER EXTREMITY VENOUS DOPPLER ULTRASOUND TECHNIQUE: Gray-scale sonography with graded compression, as  well as color Doppler and duplex ultrasound were performed to evaluate the lower extremity deep venous systems from the level of the common femoral vein and including the common femoral, femoral, profunda femoral, popliteal and calf veins including the posterior tibial, peroneal  and gastrocnemius veins when visible. The superficial great saphenous vein was also interrogated. Spectral Doppler was utilized to evaluate flow at rest and with distal augmentation maneuvers in the common femoral, femoral and popliteal veins. COMPARISON:  None. FINDINGS: Contralateral Common Femoral Vein: Respiratory phasicity is normal and symmetric with the symptomatic side. No evidence of thrombus. Normal compressibility. Common Femoral Vein: No evidence of thrombus. Normal compressibility, respiratory phasicity and response to augmentation. Profunda Femoral Vein: No evidence of thrombus. Normal compressibility and flow on color Doppler imaging. Femoral Vein: No evidence of thrombus. Normal compressibility, respiratory phasicity and response to augmentation. Popliteal Vein: No evidence of thrombus. Normal compressibility, respiratory phasicity and response to augmentation. Calf Veins: No evidence of thrombus. Normal compressibility and flow on color Doppler imaging. Superficial Great Saphenous Vein: No evidence of thrombus. Normal compressibility and flow on color Doppler imaging. Other Findings: 5.1 x 1 x 2.5 cm complex collection superficial soft tissues at the level of the ankle. IMPRESSION: No evidence of LEFT deep venous thrombosis. 5.1 x 1 x 2.5 cm superficial ankle fluid collection could represent abscess or hematoma. Electronically Signed   By: Awilda Metro M.D.   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 Shortly after he noticed some pain in his left leg just above the ankle  1. Cellulitis left lower extremity. - on clindamycin. -Ultrasound lower extremity shows collection of fluid right above the ankle 5 x 2 cm appears complex looking could be an abscess -Spoke with Dr. Deeann Saint orthopedic to see patient needs I&D  2. History of pulmonary embolism. He had a pretty severe saddle embolism late  last year. He underwent hypercoagulability workup by Dr. Orlie Dakin at the cancer center. The patient tells me he was told he could stop his anticoagulation. Her last night 5 not Orlie Dakin is he recommended lifelong anticoagulation. Patient is not wanting to restart his anticoagulation.  -However I did discuss some of this with him and I will put him on full dose Lovenox this evening and this will need to be further discussed with him before discharge.  3. History of atrial fibrillation. He said he is on propanolol for this.  Case discussed with Care Management/Social Worker. Management plans discussed with the patient, family and they are in agreement.  CODE STATUS: Full  DVT Prophylaxis: Lovenox  TOTAL TIME TAKING CARE OF THIS PATIENT: 40 minutes.  >50% time spent on counselling and coordination of care  POSSIBLE D/C IN 1-2 DAYS, DEPENDING ON CLINICAL CONDITION.  Note: This dictation was prepared with Dragon dictation along with smaller phrase technology. Any transcriptional errors that result from this process are unintentional.  Tandi Hanko M.D on 10/13/2016 at 1:31 PM  Between 7am to 6pm - Pager - [phone]  After 6pm go to [email or web address] - Social research officer, government  [facility]  Office  [phone]  CC: Primary care physician; Sherrie Mustache

## 2016-10-13 NOTE — Plan of Care (Signed)
Problem: Education: Goal: Knowledge of [facility] Education information/materials will improve Outcome: Progressing Pt understands to ring for assistance when ambulating.  Problem: Health Behavior/Discharge Planning: Goal: Ability to manage health-related needs will improve Outcome: Progressing Discussed and educated about Lovenox therapy  Problem: Pain Managment: Goal: General experience of comfort will improve Outcome: Progressing Pt pain control with iv morphine this shift.  Problem: Skin Integrity: Goal: Risk for impaired skin integrity will decrease Outcome: Progressing Still with redness to the left ankle.

## 2016-10-13 NOTE — Consult Note (Signed)
ORTHOPAEDIC CONSULTATION  REQUESTING PHYSICIAN: Enedina Finner, MD  Chief Complaint: Redness and swelling left leg  HPI: Jared Bass is a 46 y.o. male who complains of  redness and swelling left leg.  Since Thursday or Friday last week.  Hard of that he was wrestling with another man and showing him a jujitsu move.  She developed significant bruising and swelling of the left lower leg following that.  He developed redness a few days ago and was seen in the emergency room last night.  He was admitted for IV antibiotic antibiotics due to evident cellulitis.  X-rays show soft tissue swelling.  Ultrasound showed a small collection of fluid consistent with hematoma or abscess.  After 12 hours of IV antibiotics.  The redness is markedly decreased and he is feeling much better he says.  He's had a normal white count and no fever or chills.  Past Medical History:  Diagnosis Date  . Acute renal failure (HCC)   . Acute respiratory failure (HCC)    2017  . Acute respiratory failure with hypoxia (HCC)   . Acute saddle pulmonary embolism with acute cor pulmonale (HCC)   . Chronic neck pain   . Closed fracture of nasal bone   . DDD (degenerative disc disease), cervical 05/04/2015  . Faintness   . Hemoptysis   . History of atrial fibrillation   . History of head injury 05/04/2015  . History of tachycardia 05/04/2015  . Hypertension   . Hypotension   . Pulmonary emboli ([facility])    on elequis  . Pulmonary embolism ([facility])   . Right eye injury   . Saddle pulmonary embolus ([facility]) 12/13/2015  . Syncope and collapse   . Tachycardia 12/20/2015   Past Surgical History:  Procedure Laterality Date  . neck fusion     c5-7  . TONSILLECTOMY     Social History   Social History  . Marital status: Single    Spouse name: N/A  . Number of children: N/A  . Years of education: N/A   Social History Main Topics  . Smoking status: Never Smoker  . Smokeless tobacco: Never Used  . Alcohol use No  . Drug use: No  .  Sexual activity: Not Asked   Other Topics Concern  . 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   Family History  Problem Relation Age of Onset  . Hypertension Mother   . COPD Mother   . Mesothelioma Father   . COPD Father   . Peripheral vascular disease Brother     Clots in his arteries surgically removed   Allergies  Allergen Reactions  . Tramadol Anaphylaxis and Rash  . Other Other (See Comments)    Rembrant Whitening strips  "fat lips"   Prior to Admission medications   Medication Sig Start Date End Date Taking? Authorizing Provider  apixaban (ELIQUIS) 5 MG TABS tablet Take 2 tablets (10 mg total) by mouth 2 (two) times daily. 12/18/15  Yes Drema Dallas, MD  gabapentin (NEURONTIN) 800 MG tablet Take 1 tablet (800 mg total) by mouth every 6 (six) hours. 08/09/16 11/07/16  Delano Metz, MD  MAGNESIUM PO Take 1 tablet by mouth daily.    Historical Provider, MD  oxyCODONE (OXY IR/ROXICODONE) 5 MG immediate release tablet Take 1 tablet (5 mg total) by mouth every 8 (eight) hours as needed for severe pain. 08/09/16 09/08/16  Delano Metz, MD  propranolol (INDERAL) 10 MG  tablet 10 mg 3 (three) times daily.  01/14/16   Historical Provider, MD  tizanidine (ZANAFLEX) 2 MG capsule Take 1 capsule (2 mg total) by mouth 3 (three) times daily as needed for muscle spasms. 08/09/16 11/07/16  Delano Metz, MD   Dg Ankle Complete Left  Result Date: 10/12/2016 CLINICAL DATA:  Left ankle pain, erythema and swelling for 1 week. No reported injury. EXAM: LEFT ANKLE COMPLETE - 3+ VIEW COMPARISON:  None. FINDINGS: Diffuse left ankle soft tissue swelling. No fracture or subluxation. No cortical erosions or periosteal reaction. No suspicious focal osseous lesion. No appreciable arthropathy. No radiopaque foreign body. IMPRESSION: Diffuse left ankle soft tissue swelling. No fracture, malalignment or specific radiographic findings of  osteomyelitis. Electronically Signed   By: Delbert Phenix M.D.   On: 10/12/2016 20:42   US Venous Img Lower Unilateral Left  Result Date: 10/12/2016 CLINICAL DATA:  LEFT lower extremity swelling for 1 week. EXAM: LEFT LOWER EXTREMITY VENOUS DOPPLER ULTRASOUND TECHNIQUE: Gray-scale sonography with graded compression, as well as color Doppler and duplex ultrasound were performed to evaluate the lower extremity deep venous systems from the level of the common femoral vein and including the common femoral, femoral, profunda femoral, popliteal and calf veins including the posterior tibial, peroneal and gastrocnemius veins when visible. The superficial great saphenous vein was also interrogated. Spectral Doppler was utilized to evaluate flow at rest and with distal augmentation maneuvers in the common femoral, femoral and popliteal veins. COMPARISON:  None. FINDINGS: Contralateral Common Femoral Vein: Respiratory phasicity is normal and symmetric with the symptomatic side. No evidence of thrombus. Normal compressibility. Common Femoral Vein: No evidence of thrombus. Normal compressibility, respiratory phasicity and response to augmentation. Profunda Femoral Vein: No evidence of thrombus. Normal compressibility and flow on color Doppler imaging. Femoral Vein: No evidence of thrombus. Normal compressibility, respiratory phasicity and response to augmentation. Popliteal Vein: No evidence of thrombus. Normal compressibility, respiratory phasicity and response to augmentation. Calf Veins: No evidence of thrombus. Normal compressibility and flow on color Doppler imaging. Superficial Great Saphenous Vein: No evidence of thrombus. Normal compressibility and flow on color Doppler imaging. Other Findings: 5.1 x 1 x 2.5 cm complex collection superficial soft tissues at the level of the ankle. IMPRESSION: No evidence of LEFT deep venous thrombosis. 5.1 x 1 x 2.5 cm superficial ankle fluid collection could represent abscess or  hematoma. Electronically Signed   By: Awilda Metro M.D.   On: 10/12/2016 19:31    Positive ROS: All other systems have been reviewed and were otherwise negative with the exception of those mentioned in the HPI and as above.  Physical Exam: General: Alert, no acute distress Cardiovascular: No pedal edema Respiratory: No cyanosis, no use of accessory musculature GI: No organomegaly, abdomen is soft and non-tender Skin: No lesions in the area of chief complaint Neurologic: Sensation intact distally Psychiatric: Patient is competent for consent with normal mood and affect Lymphatic: No axillary or cervical lymphadenopathy  MUSCULOSKELETAL: Patient alert and cooperative, in no distress.  Left lower leg shows a small area of redness.  His medial and about 5 inches above the ankle.  There is minimal warmth and mild tenderness.  There is not a ballotable abscess noted.  Neurovascular status is good, and range of motion is good.  Assessment: Cellulitis, left leg with prior hematoma.  Plan: Continue IV antibiotics.  At this point, I don't think I&D as necessary. We'll reevaluate tomorrow morning.    Valinda Hoar, MD [phone]   10/13/2016 1:01 PM

## 2016-10-13 NOTE — Progress Notes (Signed)
Pt still complaining of pain to left ankle stating that Toradol not helping much. Dr. Tobi Bastos notified and ordered 2mg  of morphine Q4h prn.

## 2016-10-14 LAB — HIV ANTIBODY (ROUTINE TESTING W REFLEX): HIV Screen 4th Generation wRfx: NONREACTIVE

## 2016-10-14 NOTE — Progress Notes (Signed)
Subjective:       Patient reports pain as mild.  Objective:   VITALS:   Vitals:   10/13/16 2323 10/14/16 0750  BP: (!) 147/95 (!) 129/96  Pulse: 83 74  Resp: 18 18  Temp: 97.4 F (36.3 C) 98.6 F (37 C)    Neurologically intact ABD soft Neurovascular intact Sensation intact distally Intact pulses distally Dorsiflexion/Plantar flexion intact Compartment soft Minimal reddness or warmth.  Mild tenderness.  No significant fluctuance.    LABS  Recent Labs  10/12/16 1729  HGB 15.9  HCT 46.8  WBC 7.4  PLT 214     Recent Labs  10/12/16 1729  NA 138  K 4.5  BUN 10  CREATININE 0.72  GLUCOSE 104*    No results for input(s): LABPT, INR in the last 72 hours.   Assessment/Plan:      Switch to oral antibiotics  D/Ce when stable RTC 3-5 days

## 2016-10-14 NOTE — Progress Notes (Signed)
[facility] [place] at [facility]   PATIENT NAME: Jared Bass    MR#:  [id number]  DATE OF BIRTH:  March 17, 1971  Came in with pain over the left ankle for last few days saw some redness admitted with cellulitis Continues with pain in the left leg. Localizes redness persists REVIEW OF SYSTEMS:   Review of Systems  Constitutional: Negative for chills, fever and weight loss.  HENT: Negative for ear discharge, ear pain and nosebleeds.   Eyes: Negative for blurred vision, pain and discharge.  Respiratory: Negative for sputum production, shortness of breath, wheezing and stridor.   Cardiovascular: Negative for chest pain, palpitations, orthopnea and PND.  Gastrointestinal: Negative for abdominal pain, diarrhea, nausea and vomiting.  Genitourinary: Negative for frequency and urgency.  Musculoskeletal: Positive for joint pain. Negative for back pain.  Neurological: Negative for sensory change, speech change, focal weakness and weakness.  Psychiatric/Behavioral: Negative for depression and hallucinations. The patient is not nervous/anxious.    DRUG ALLERGIES:   Allergies  Allergen Reactions  . Tramadol Anaphylaxis and Rash  . Other Other (See Comments)    Rembrant Whitening strips  "fat lips"    VITALS:  Blood pressure (!) 147/95, pulse 83, temperature 97.4 F (36.3 C), temperature source Oral, resp. rate 18, height 5\' 8"  (1.727 m), weight 102.4 kg (225 lb 12.8 oz), SpO2 100 %.  PHYSICAL EXAMINATION:   Physical Exam  GENERAL:  46 y.o.-year-old patient lying in the bed with no acute distress.  EYES: Pupils equal, round, reactive to light and accommodation. No scleral icterus. Extraocular muscles intact.  HEENT: Head atraumatic, normocephalic. Oropharynx and nasopharynx clear.  NECK:  Supple, no jugular venous distention. No thyroid enlargement, no tenderness.  LUNGS: Normal breath sounds bilaterally, no wheezing, rales, rhonchi. No use of accessory muscles  of respiration.  CARDIOVASCULAR: S1, S2 normal. No murmurs, rubs, or gallops.  ABDOMEN: Soft, nontender, nondistendedLeft lower extremity so that is improving. Redness improved from the demarcated area. Localized redness around the above the left ankle tender to touch  No cyanosis, clubbing or edema b/l.    NEUROLOGIC: Cranial nerves II through XII are intact. No focal Motor or sensory deficits b/l.   PSYCHIATRIC:  patient is alert and oriented x 3.  SKIN: No obvious rash, lesion, or ulcer.   LABORATORY PANEL:  CBC  Recent Labs Lab 10/12/16 1729  WBC 7.4  HGB 15.9  HCT 46.8  PLT 214    Chemistries   Recent Labs Lab 10/12/16 1729  NA 138  K 4.5  CL 103  CO2 29  GLUCOSE 104*  BUN 10  CREATININE 0.72  CALCIUM 9.1  AST 67*  ALT 61  ALKPHOS 71  BILITOT 0.6   Cardiac Enzymes No results for input(s): TROPONINI in the last 168 hours. RADIOLOGY:  Dg Ankle Complete Left  Result Date: 10/12/2016 CLINICAL DATA:  Left ankle pain, erythema and swelling for 1 week. No reported injury. EXAM: LEFT ANKLE COMPLETE - 3+ VIEW COMPARISON:  None. FINDINGS: Diffuse left ankle soft tissue swelling. No fracture or subluxation. No cortical erosions or periosteal reaction. No suspicious focal osseous lesion. No appreciable arthropathy. No radiopaque foreign body. IMPRESSION: Diffuse left ankle soft tissue swelling. No fracture, malalignment or specific radiographic findings of osteomyelitis. Electronically Signed   By: Delbert Phenix M.D.   On: 10/12/2016 20:42   US Venous Img Lower Unilateral Left  Result Date: 10/12/2016 CLINICAL DATA:  LEFT lower extremity swelling for 1 week. EXAM: LEFT LOWER EXTREMITY  VENOUS DOPPLER ULTRASOUND TECHNIQUE: Gray-scale sonography with graded compression, as well as color Doppler and duplex ultrasound were performed to evaluate the lower extremity deep venous systems from the level of the common femoral vein and including the common femoral, femoral, profunda  femoral, popliteal and calf veins including the posterior tibial, peroneal and gastrocnemius veins when visible. The superficial great saphenous vein was also interrogated. Spectral Doppler was utilized to evaluate flow at rest and with distal augmentation maneuvers in the common femoral, femoral and popliteal veins. COMPARISON:  None. FINDINGS: Contralateral Common Femoral Vein: Respiratory phasicity is normal and symmetric with the symptomatic side. No evidence of thrombus. Normal compressibility. Common Femoral Vein: No evidence of thrombus. Normal compressibility, respiratory phasicity and response to augmentation. Profunda Femoral Vein: No evidence of thrombus. Normal compressibility and flow on color Doppler imaging. Femoral Vein: No evidence of thrombus. Normal compressibility, respiratory phasicity and response to augmentation. Popliteal Vein: No evidence of thrombus. Normal compressibility, respiratory phasicity and response to augmentation. Calf Veins: No evidence of thrombus. Normal compressibility and flow on color Doppler imaging. Superficial Great Saphenous Vein: No evidence of thrombus. Normal compressibility and flow on color Doppler imaging. Other Findings: 5.1 x 1 x 2.5 cm complex collection superficial soft tissues at the level of the ankle. IMPRESSION: No evidence of LEFT deep venous thrombosis. 5.1 x 1 x 2.5 cm superficial ankle fluid collection could represent abscess or hematoma. Electronically Signed   By: Awilda Metro M.D.   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 Shortly after he noticed some pain in his left leg just above the ankle  1. Cellulitis left lower extremity. - on clindamycin. -Ultrasound lower extremity shows collection of fluid right above the ankle 5 x 2 cm appears complex looking could be an abscess -Spoke with Dr. Deeann Saint orthopedic to see patient needs I&D  2.  History of pulmonary embolism. He had a pretty severe saddle embolism late last year. He underwent hypercoagulability workup by Dr. Orlie Dakin at the cancer center.  -However we did discuss some of this with him and put him on full dose Lovenox this evening and this will need to be further discussed with him before discharge.(lovenox on hold today for possible I and D)  3. History of atrial fibrillation.  -cont propranolol  Case discussed with Care Management/Social Worker. Management plans discussed with the patient, family and they are in agreement.  CODE STATUS: Full  DVT Prophylaxis: Lovenox  TOTAL TIME TAKING CARE OF THIS PATIENT: 40 minutes.  >50% time spent on counselling and coordination of care  Note: This dictation was prepared with Dragon dictation along with smaller phrase technology. Any transcriptional errors that result from this process are unintentional.  Whitaker Holderman M.D on 10/14/2016 at 7:46 AM  Between 7am to 6pm - Pager - [phone]  After 6pm go to [email or web address] - Social research officer, government  [facility] [place] [facility]  Office  [phone]  CC: Primary care physician; Sherrie Mustache

## 2016-10-15 MED ORDER — CLINDAMYCIN HCL 300 MG PO CAPS: 600.0000 mg | ORAL_CAPSULE | Freq: Three times a day (TID) | ORAL | 0 refills | Status: DC

## 2016-10-15 MED ORDER — CLINDAMYCIN HCL 150 MG PO CAPS
600.0000 mg | ORAL_CAPSULE | Freq: Three times a day (TID) | ORAL | Status: DC
  Filled 2016-10-15: qty 2

## 2016-10-15 MED ORDER — APIXABAN 5 MG PO TABS: 5.0000 mg | ORAL_TABLET | Freq: Two times a day (BID) | ORAL | Status: DC

## 2016-10-15 MED ORDER — OXYCODONE-ACETAMINOPHEN 5-325 MG PO TABS: 1.0000 | ORAL_TABLET | Freq: Four times a day (QID) | ORAL | 0 refills | Status: DC | PRN

## 2016-10-15 MED ORDER — APIXABAN 5 MG PO TABS: 5.0000 mg | ORAL_TABLET | Freq: Two times a day (BID) | ORAL | 0 refills | Status: DC

## 2016-10-15 NOTE — Discharge Summary (Signed)
[facility] - [place] at [facility]   PATIENT NAME: Jerusalem Munkres    MR#:  [id number]  DATE OF BIRTH:  January 12, 1971  DATE OF ADMISSION:  10/12/2016 ADMITTING PHYSICIAN: Gracelyn Nurse, MD  DATE OF DISCHARGE: 10/15/16  PRIMARY CARE PHYSICIAN: Sherrie Mustache    ADMISSION DIAGNOSIS:  Swelling [R60.9] Cellulitis of left lower extremity [L03.116] Fluid collection (edema) in the arms, legs, hands and feet [R60.0] Abrasion of left lower extremity, initial encounter [S80.812A]  DISCHARGE DIAGNOSIS:  Left ankle cellulitis h/o PE in the past Now pt back on eliquis (pt agreed to be back on it)  SECONDARY DIAGNOSIS:   Past Medical History:  Diagnosis Date  . Acute renal failure (HCC)   . Acute respiratory failure (HCC)    2017  . Acute respiratory failure with hypoxia (HCC)   . Acute saddle pulmonary embolism with acute cor pulmonale (HCC)   . Chronic neck pain   . Closed fracture of nasal bone   . DDD (degenerative disc disease), cervical 05/04/2015  . Faintness   . Hemoptysis   . History of atrial fibrillation   . History of head injury 05/04/2015  . History of tachycardia 05/04/2015  . Hypertension   . Hypotension   . Pulmonary emboli (HCC)    on elequis  . Pulmonary embolism (HCC)   . Right eye injury   . Saddle pulmonary embolus (HCC) 12/13/2015  . Syncope and collapse   . Tachycardia 12/20/2015    HOSPITAL COURSE:  46 year old male who is last Thursday trying to teach his friends some jujitsu moves.Shortly after he noticed some pain in his left leg just above the ankle  1. Cellulitis left lower extremity. - on clindamycin. -Ultrasound lower extremity shows collection of fluid right above the ankle 5 x 2 cm appears complex looking could be an abscess -Spoke with Dr. Deeann Saint orthopedic and he recommends complete abxs. No indications for I and D at present. Patient will follow-up with Dr. Hyacinth Meeker closely.  2. History of pulmonary embolism. He  had a pretty severe saddle embolism late last year. He underwent hypercoagulability workup by Dr. Orlie Dakin at the cancer center.  -Patient is now started back on ELIQUIS5 milligrams twice a day. He has agreed to remain on it. He already has a prescription for it.  3. History of atrial fibrillation.  -cont propranolol  Overall stable. Patient was discharged to home.  CONSULTS OBTAINED:  Treatment Team:  Deeann Saint, MD  DRUG ALLERGIES:   Allergies  Allergen Reactions  . Tramadol Anaphylaxis and Rash  . Other Other (See Comments)    Rembrant Whitening strips  "fat lips"    DISCHARGE MEDICATIONS:   Current Discharge Medication List    START taking these medications   Details  clindamycin (CLEOCIN) 300 MG capsule Take 2 capsules (600 mg total) by mouth every 8 (eight) hours. Qty: 21 capsule, Refills: 0    oxyCODONE-acetaminophen (PERCOCET/ROXICET) 5-325 MG tablet Take 1-2 tablets by mouth every 6 (six) hours as needed for moderate pain. Qty: 30 tablet, Refills: 0      CONTINUE these medications which have CHANGED   Details  apixaban (ELIQUIS) 5 MG TABS tablet Take 1 tablet (5 mg total) by mouth 2 (two) times daily. Qty: 60 tablet, Refills: 0      CONTINUE these medications which have NOT CHANGED   Details  gabapentin (NEURONTIN) 800 MG tablet Take 1 tablet (800 mg total) by mouth every 6 (six) hours. Qty: 120 tablet, Refills:  2   Associated Diagnoses: Neuropathic pain; Neurogenic pain    MAGNESIUM PO Take 1 tablet by mouth daily.    propranolol (INDERAL) 10 MG tablet 10 mg 3 (three) times daily.     tizanidine (ZANAFLEX) 2 MG capsule Take 1 capsule (2 mg total) by mouth 3 (three) times daily as needed for muscle spasms. Qty: 90 capsule, Refills: 2   Associated Diagnoses: Muscle spasms of neck; Myofascial pain      STOP taking these medications     oxyCODONE (OXY IR/ROXICODONE) 5 MG immediate release tablet         If you experience worsening of your  admission symptoms, develop shortness of breath, life threatening emergency, suicidal or homicidal thoughts you must seek medical attention immediately by calling 911 or calling your MD immediately  if symptoms less severe.  You Must read complete instructions/literature along with all the possible adverse reactions/side effects for all the Medicines you take and that have been prescribed to you. Take any new Medicines after you have completely understood and accept all the possible adverse reactions/side effects.   Please note  You were cared for by a hospitalist during your hospital stay. If you have any questions about your discharge medications or the care you received while you were in the hospital after you are discharged, you can call the unit and asked to speak with the hospitalist on call if the hospitalist that took care of you is not available. Once you are discharged, your primary care physician will handle any further medical issues. Please note that NO REFILLS for any discharge medications will be authorized once you are discharged, as it is imperative that you return to your primary care physician (or establish a relationship with a primary care physician if you do not have one) for your aftercare needs so that they can reassess your need for medications and monitor your lab values. Today   SUBJECTIVE  Doing well   VITAL SIGNS:  Blood pressure (!) 143/79, pulse 99, temperature 98.3 F (36.8 C), temperature source Oral, resp. rate 16, height 5\' 8"  (1.727 m), weight 102.4 kg (225 lb 12.8 oz), SpO2 97 %.  I/O:   Intake/Output Summary (Last 24 hours) at 10/15/16 0956 Last data filed at 10/15/16 0400  Gross per 24 hour  Intake              390 ml  Output                0 ml  Net              390 ml    PHYSICAL EXAMINATION:  GENERAL:  46 y.o.-year-old patient lying in the bed with no acute distress.  EYES: Pupils equal, round, reactive to light and accommodation. No scleral  icterus. Extraocular muscles intact.  HEENT: Head atraumatic, normocephalic. Oropharynx and nasopharynx clear.  NECK:  Supple, no jugular venous distention. No thyroid enlargement, no tenderness.  LUNGS: Normal breath sounds bilaterally, no wheezing, rales,rhonchi or crepitation. No use of accessory muscles of respiration.  CARDIOVASCULAR: S1, S2 normal. No murmurs, rubs, or gallops.  ABDOMEN: Soft, non-tender, non-distended. Bowel sounds present. No organomegaly or mass.  EXTREMITIES: No pedal edema, cyanosis, or clubbing. Small area of erythema over the left tibial shin above the ankle. NEUROLOGIC: Cranial nerves II through XII are intact. Muscle strength 5/5 in all extremities. Sensation intact. Gait not checked.  PSYCHIATRIC: The patient is alert and oriented x 3.  SKIN: No obvious rash, lesion,  or ulcer.   DATA REVIEW:   CBC   Recent Labs Lab 10/12/16 1729  WBC 7.4  HGB 15.9  HCT 46.8  PLT 214    Chemistries   Recent Labs Lab 10/12/16 1729  NA 138  K 4.5  CL 103  CO2 29  GLUCOSE 104*  BUN 10  CREATININE 0.72  CALCIUM 9.1  AST 67*  ALT 61  ALKPHOS 71  BILITOT 0.6    Microbiology Results   No results found for this or any previous visit (from the past 240 hour(s)).  RADIOLOGY:  No results found.   Management plans discussed with the patient, family and they are in agreement.  CODE STATUS:     Code Status Orders        Start     Ordered   10/12/16 2257  Full code  Continuous     10/12/16 2256    Code Status History    Date Active Date Inactive Code Status Order ID Comments User Context   12/13/2015  2:18 AM 12/18/2015  9:20 PM Full Code [id number]  Roslynn Amble, MD ED      TOTAL TIME TAKING CARE OF THIS PATIENT: 40 minutes.    Jocelyn Nold M.D on 10/15/2016 at 9:56 AM  Between 7am to 6pm - Pager - [phone] After 6pm go to [email or web address] - Social research officer, government  [facility] [place] [facility]  Office  [phone]  CC: Primary care  physician; Sherrie Mustache

## 2016-10-15 NOTE — Care Management Important Message (Signed)
Important Message  Patient Details  Name: KELSIE RIEBEL MRN: [id number] Date of Birth: July 13, 1970   Medicare Important Message Given:  Yes    Marily Memos, RN 10/15/2016, 10:10 AM

## 2016-10-15 NOTE — Progress Notes (Signed)
Patient is alert and oriented and able to verbalize needs. No complaints of pain at this time. Vital signs stable. PIV removed. Discharge instructions gone over with patient. Follow up appointments made. Printed AVS and hard script for Percocet given to patient at this time. Patient verbalizes understanding of all follow care and instructions. No concerns voiced at this time. Patient called friend to transport him home.   Suzan Slick, RN

## 2016-10-29 DIAGNOSIS — M542 Cervicalgia: Secondary | ICD-10-CM | POA: Diagnosis not present

## 2016-10-29 DIAGNOSIS — L03119 Cellulitis of unspecified part of limb: Secondary | ICD-10-CM | POA: Diagnosis not present

## 2016-10-29 DIAGNOSIS — I8010 Phlebitis and thrombophlebitis of unspecified femoral vein: Secondary | ICD-10-CM | POA: Diagnosis not present

## 2016-10-29 DIAGNOSIS — F410 Panic disorder [episodic paroxysmal anxiety] without agoraphobia: Secondary | ICD-10-CM | POA: Diagnosis not present

## 2016-10-30 DIAGNOSIS — Z125 Encounter for screening for malignant neoplasm of prostate: Secondary | ICD-10-CM | POA: Diagnosis not present

## 2016-10-30 DIAGNOSIS — F419 Anxiety disorder, unspecified: Secondary | ICD-10-CM | POA: Diagnosis not present

## 2016-10-30 DIAGNOSIS — I8010 Phlebitis and thrombophlebitis of unspecified femoral vein: Secondary | ICD-10-CM | POA: Diagnosis not present

## 2016-10-30 DIAGNOSIS — N289 Disorder of kidney and ureter, unspecified: Secondary | ICD-10-CM | POA: Diagnosis not present

## 2016-10-30 DIAGNOSIS — L03116 Cellulitis of left lower limb: Secondary | ICD-10-CM | POA: Diagnosis not present

## 2016-10-30 DIAGNOSIS — M542 Cervicalgia: Secondary | ICD-10-CM | POA: Diagnosis not present

## 2016-10-30 DIAGNOSIS — E559 Vitamin D deficiency, unspecified: Secondary | ICD-10-CM | POA: Diagnosis not present

## 2016-10-30 DIAGNOSIS — E784 Other hyperlipidemia: Secondary | ICD-10-CM | POA: Diagnosis not present

## 2017-01-22 ENCOUNTER — Ambulatory Visit (INDEPENDENT_AMBULATORY_CARE_PROVIDER_SITE_OTHER): Payer: Medicare Other | Admitting: Internal Medicine

## 2017-01-22 DIAGNOSIS — M4322 Fusion of spine, cervical region: Secondary | ICD-10-CM

## 2017-01-22 DIAGNOSIS — M5010 Cervical disc disorder with radiculopathy, unspecified cervical region: Secondary | ICD-10-CM | POA: Diagnosis not present

## 2017-01-22 DIAGNOSIS — Z888 Allergy status to other drugs, medicaments and biological substances status: Secondary | ICD-10-CM | POA: Diagnosis not present

## 2017-01-22 DIAGNOSIS — Z79899 Other long term (current) drug therapy: Secondary | ICD-10-CM

## 2017-01-22 DIAGNOSIS — F1120 Opioid dependence, uncomplicated: Secondary | ICD-10-CM

## 2017-01-22 DIAGNOSIS — Z836 Family history of other diseases of the respiratory system: Secondary | ICD-10-CM

## 2017-01-22 DIAGNOSIS — F419 Anxiety disorder, unspecified: Secondary | ICD-10-CM | POA: Diagnosis not present

## 2017-01-22 DIAGNOSIS — Z8249 Family history of ischemic heart disease and other diseases of the circulatory system: Secondary | ICD-10-CM | POA: Diagnosis not present

## 2017-01-22 DIAGNOSIS — F909 Attention-deficit hyperactivity disorder, unspecified type: Secondary | ICD-10-CM

## 2017-01-22 DIAGNOSIS — Z7901 Long term (current) use of anticoagulants: Secondary | ICD-10-CM | POA: Diagnosis not present

## 2017-01-22 DIAGNOSIS — Z86711 Personal history of pulmonary embolism: Secondary | ICD-10-CM | POA: Diagnosis not present

## 2017-01-22 MED ORDER — BUPRENORPHINE HCL-NALOXONE HCL 8-2 MG SL FILM: 2.0000 | ORAL_FILM | Freq: Every day | SUBLINGUAL | 0 refills | Status: DC

## 2017-01-22 MED ORDER — BUPRENORPHINE HCL-NALOXONE HCL 5.7-1.4 MG SL SUBL: 2.0000 | SUBLINGUAL_TABLET | Freq: Every day | SUBLINGUAL | 0 refills | Status: DC

## 2017-01-22 NOTE — Progress Notes (Addendum)
Internal Medicine Clinic Attending  I saw and evaluated the patient.  I personally confirmed the key portions of the history and exam documented by Dr. Johnny Bridge and I reviewed pertinent patient test results.  The assessment, diagnosis, and plan were formulated together and I agree with the documentation in the resident's note.  46 year old man who is self-referred to our clinic for an evaluation for opioid use disorder. As documented this patient has a 10 year history of chronic neck pain related to degenerative disc disease, has had 2 surgeries and reportedly has C4-5 fusion, and endorses persistent daily pain with radiculopathy down his right arm. He is on disability for this chronic pain. He does not currently have an established pain doctor, he does have an outside PCP managing his anticoagulation for a prior unprovoked PE. Family history is non-contributory. He reports using high doses of oxycodone, generally will use as many as he is financially able to purchase illicitly. He has good insight into the fact that his use of opioids is more than what is necessary to treat his chronic pain component. He has very strong cravings for opioids, puts himself in dangerous situations to obtain them, has ruined many social and family relationships because of his addiction, clearly has tolerance and dependence on opioids as well. He's currently in mild withdrawal, last reported opioid use was 2 days ago. He's tried Suboxone once before, obtained illicitly, says that it helped with his pain for 1 day. He's also tried methadone illicitly, says that it made him feel agitated. He's never been on physician directed medication assisted therapy. Overall I think he is a good candidate for Suboxone therapy, with the goal of stopping illicitly obtained oxycodone, managing withdrawal symptoms, and managing cravings. The patient understands and strongly desires treatment. We will start with Zubsolv 5.7 mg tabs, two tabs daily (this  is [employer]'s formulary preference, which is fine). I reviewed the control database today which shows last dispensing in April, related to a hospitalization. We obtained a urine tox today, and screened for communicable diseases. We also completed a treatment agreement contract. Plan is to follow-up in one week to monitor symptoms and refill meds.

## 2017-01-22 NOTE — Assessment & Plan Note (Addendum)
Pt is a 46 year old man who presents to the clinic as a new patient. His main goal of visit today is to seek treatment for his opioid use.  Pt has a history of chronic neck pain since 2007 when he had some injury andhad subsequent surgeries in that area, and now has residual numbness and tingling in the right arm going down to the thumb, and chronic neck pain.Marland Kitchen He also has cervical spondylosis , as well as history of acute PE in 2017 for which he is compliant with his eliquis. Also has ADHD.  Social history is notable for patient living by himself,. 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 He has noted his social relationships to deteriorate after being on opioids.  Physical exam remarkable only for some neck tenderness, and limited ROM. Other neurological exam unremarkable.   Patient says that he currently takes 6-7 oxycodonone-acetaminophen 10-325 mg per day which he obtains it illicitly, along with occassional adderalls. He also tried to snort heroin 6 months ago, and 3 weeks ago took some marijuana. Taking the oxycodone brings the pain down to a zero, and when he is not taking it, the pain is about a 9.   He used to visit a pain clinic and saw Dr Laban Emperor  But does not currently see him as he does not wish to be injected in the neck for pain.  Review of the database indicates  That on 2/8 oxycodone 5 mg # 90, and on 4/16 oxycodone-acetaminophen # 30 were prescribed. But patient says that he is obtaining the oxycodone illicitly. No other medications found on database.   Patient meets most of the criteria for opioid use disorder and he was also exhibiting symptoms of withdrawal. A COWS score of 8 indicating mild opioid withdrawal.    Plan -Given that the patient meets criteria for opioid use disorder, and that he has a desire to be off the oxycodone, we will start Suboxone therapy 8-2 mg , 2 films per day.  He was instructed to start today as he has not taken oxycodone in 2  days.  -Suboxone agreement was signed, and counseling was given regarding administering suboxone, and its side effects. -Labs obtained for Hepatitis C and RPR. An HIV antibody test was negative in April, and CMET was unremarkable in April also. A urine toxicology screen was also ordered.  -Return to suboxone clinic in 1 week to re-assess symptoms.

## 2017-01-22 NOTE — Addendum Note (Signed)
Addended by: Erlinda Hong T on: 01/22/2017 11:23 AM   Modules accepted: Orders

## 2017-01-22 NOTE — Progress Notes (Signed)
01/22/2017  Jared Bass presents for buprenorphine/naloxone intake visit.   I have reviewed the initial questionnaire information provided by the clinic director, including labwork which was available in Epic.  I have reviewed outside records provided by patient if available.  The salient points were confirmed with the patient.    Review of substance use history (first use, substances used, any illicit purchases):  Pt obtains oxycodone from the 'street' 6-7 tablets per day  also obtains adderall  illicitly   Last substance used:  Continues to take oxycodone 10 mg-tylenol 325 6-7 tablets per day- "whatever he can get his hands on". Last use was 2 days ago  Smoked marijuana 3 weeks ago Took heroin via nasal route 6 months ago  Last alcohol use 4 years ago- does not currently use alcohol -No smoking  If last substance not an opioid, last opioid used (type, dose, route, withdrawal symptoms):  Oxycodone 2 days ago- experiences hot and cold sensations, insomnia, some nausea, some anxiety   Mental Health History:  Diagnosed with ADHD since childhood and took ritalin in the past, continues to take adderall  Anxiety    Current counseling/behavioural health provider: Does not see anyone currently   This patient has Opioid Use Disorder by following DSM-V criteria: - All of the below apply   - Opioids taken in larger amounts or over a longer period than intended - Persistent desire to cut down - A great deal of time is spent to obtain/use/recover from the opioid - Cravings to use opioids - Use resulting in a failure to fulfill major role obligations- unable to work when in pain  - Continue opioid use despite persistent social or interpersonal problems - Important activities are given up or reduced because of opioid use - Recurrent opioid use in situations in which it is physically hazardous - Use despite knowledge of health problems caused by opioids - Tolerance - Withdrawal  Past  Medical History:  Diagnosis Date  . Acute renal failure (HCC)   . Acute respiratory failure (HCC)    2017  . Acute respiratory failure with hypoxia (HCC)   . Acute saddle pulmonary embolism with acute cor pulmonale (HCC)   . Chronic neck pain   . Closed fracture of nasal bone   . DDD (degenerative disc disease), cervical 05/04/2015  . Faintness   . Hemoptysis   . History of atrial fibrillation   . History of head injury 05/04/2015  . History of tachycardia 05/04/2015  . Hypertension   . Hypotension   . Pulmonary emboli (HCC)    on elequis  . Pulmonary embolism (HCC)   . Right eye injury   . Saddle pulmonary embolus (HCC) 12/13/2015  . Syncope and collapse   . Tachycardia 12/20/2015    Current Outpatient Prescriptions on File Prior to Visit  Medication Sig Dispense Refill  . apixaban (ELIQUIS) 5 MG TABS tablet Take 1 tablet (5 mg total) by mouth 2 (two) times daily. 60 tablet 0  . propranolol (INDERAL) 10 MG tablet 10 mg 3 (three) times daily.      No current facility-administered medications on file prior to visit.     Physical Exam  Vitals:   01/22/17 0852  BP: 137/83  Pulse: 90  Temp: 98.2 F (36.8 C)  TempSrc: Oral  SpO2: 97%  Weight: 214 lb 9.6 oz (97.3 kg)   General:  A&O,  HEENT: Right pupil dilated. Left pupil normal, Neck: supple, no lymphadenopathy. Diffuse  Tenderness in cervical  area CV: Regular rate, regular rhythm, no murmurs or rubs appreciated. Resp: equal and symmetric breath sounds, no wheezing or crackles. Abdomen: soft, nontender, nondistended, +BS in all 4 quadrants Skin: warm, dry, intact, no open lesions or atypical rash noted Extremities:  no edema Neurologic: Patient is alert and oriented x3, and no gross deficits noted. Normal motor and sensory exam.     Clinical Opiate Withdrawal Scale: bold applicable COWS scoring   - Resting HR:    - 0 for < 80   - 1 for 81 - 100   - 2 for 101 - 120   - 4 for > 120  - Sweating:   - 0 for no  chills/flushing   - 1 for subjective chills/flushing   - 3 for beads of sweat on brow/face   - 4 for sweat streaming off of face  - Restlessness:    - 0 for able to sit still   - 1 for subjective difficulty sitting still   - 3 for frequent shifting or extraneous movement   - 5 for unable to sit still for more than a few seconds  - Pupil size:  Right pupil dilated due to eye injury in the past. Left pupil is normal    - 0 for pinpoint or normal   - 1 for possibly larger than normal   - 2 for moderately dilated   - 5 for only iris rim visible  - Bone/joint pain:    - 0 for not present   - 1 for mild diffuse discomfort   - 2 severe diffuse aching   - 4 for objectively rubbing joints/muscles and obviously in pain  - Runny nose/tearing:    - 0 for not present   - 1 for stuffy nose/moist eyes   - 2 for nose running/tearing   - 4 for nose constantly running or tears streaming down cheeks  - GI Upset:    - 0 for no GI symptoms   - 1 for stomach cramps   - 2 for nausea or loose stool   - 3 for vomiting or diarrhea   - 5 for multiple episodes of vomiting or diarrhea  - Tremor observation of outstretched hands:    - 0 for no tremor   - 1 for tremor can be felt but not observed   - 2 for slight tremor observable   - 4 for gross tremor or muscle twitching  - Yawning:    - 0 for no yawning   - 1 for yawning once or twice during assessment   - 2 for yawning three or more times during assessment   - 4 for yawning several times per minute  - Anxiety or irritability:    - 0 for none   - 1 for patient reports increasing irritability or anxiousness   - 2 for patient obviously irritable/anxious   - 4 for patient so irritable/anxious that assessment is difficult  - Gooseflesh:    - 0 for skin is smooth   - 3 for piloerection of skin can be felt or seen   - 5 for prominent piloerection  TOTAL: 8  Assessment/Plan:   Based on a review of the patient's medical history including substance  use and mental health factors, and physical exam, Jared Bass is a suitable candidate for MAT with buprenorphine/naloxone.  UDS ordered this visit.    I have discussed HIV and Hepatitis C screening with this patient.  I have reviewed  and prescribed naloxone use with this patient in the case of emergency.   CMET checked in April 2018 shows LFTs WNL except mild elevation of the AST.  Home Induction:   I have instructed the patient how to appropriately take this medication, including placing under the tongue with head relaxed for 10 minutes and allowing to dissolve without chewing or swallowing tab/film, and with nothing to eat or drink in the subsequent 15 minutes.  They have been told not to start taking the medication until they have significant signs of withdrawal and I have explained the concept of precipitated withdrawal with the patient.    Intervisit Care:  I will call the patient the morning after induction to assess symptom burden and determine the need for additional dose titration.  We discussed this medication must be kept in a safe place and away from children.   We will see the patient back in 1 week in clinic, with options for a sooner appointment based on patient and provider preference.   Patient was encouraged to call the office and speak with the MD on call for any urgent concerns.    Deneise Lever, MD 01/22/2017 10:05 AM

## 2017-01-22 NOTE — Addendum Note (Signed)
Addended by: Erlinda Hong T on: 01/22/2017 02:42 PM   Modules accepted: Level of Service

## 2017-01-22 NOTE — Patient Instructions (Signed)

## 2017-01-23 LAB — RPR: RPR: NONREACTIVE

## 2017-01-23 LAB — HEPATITIS C ANTIBODY

## 2017-01-24 ENCOUNTER — Telehealth: Payer: Self-pay | Admitting: Internal Medicine

## 2017-01-24 NOTE — Telephone Encounter (Signed)
I called the pt on 7/25. He indicated that he received the zubsolv tablets, and had started taking them. He denies any withdrawal symptoms, and he denies any other side effects. He says that he will follow up on next Tuesday.

## 2017-01-29 ENCOUNTER — Ambulatory Visit (INDEPENDENT_AMBULATORY_CARE_PROVIDER_SITE_OTHER): Payer: Medicare Other | Admitting: Internal Medicine

## 2017-01-29 VITALS — BP 130/91 | HR 71 | Temp 98.3°F | Wt 214.1 lb

## 2017-01-29 DIAGNOSIS — F1120 Opioid dependence, uncomplicated: Secondary | ICD-10-CM | POA: Diagnosis present

## 2017-01-29 LAB — TOXASSURE SELECT,+ANTIDEPR,UR

## 2017-01-29 MED ORDER — POLYETHYLENE GLYCOL 3350 17 G PO PACK: 17.0000 g | PACK | Freq: Every day | ORAL | 0 refills | Status: DC

## 2017-01-29 MED ORDER — BUPRENORPHINE HCL-NALOXONE HCL 5.7-1.4 MG SL SUBL: 2.0000 | SUBLINGUAL_TABLET | Freq: Every day | SUBLINGUAL | 0 refills | Status: DC

## 2017-01-29 NOTE — Assessment & Plan Note (Addendum)
Patient is here for follow up after starting treatment for his opioid use disorder. He has chronic pain for 10 years. Prior to starting therapy, he was managing the chronic pain by obtaining illicit percosets.  Since last week, patient mentions that he has not used any percosets and has not obtained anything illicit including heroine. He denies any symptoms of withdrawal including diarrhea, irritability. He does endorses some insomnia after starting zubsolv, but says that his pain is better managed now. He also endorses some constipation. Overall, he is doing well. He denies any cravings. [place] database was reviewed today and shows appropriate dispensing of the zubsolv.  UDS from last week is still pending. Labs were reviewed with the patient and were negative for HIV and RPR. He did have some mild elevation of the LFTs in April but it was when he was hospitalized.   Plan -continue Zubsolv at current dose since his symptoms are well maintained on the current dose.  -prescription given for the miralax.  -Follow up in 1 week.  Addendum: UDS from last week shows expected oxycodone metabolites. Unexpected buprenorphine, and unexpected temazepam.. Will need to discuss at next visit.

## 2017-01-29 NOTE — Progress Notes (Signed)
    01/29/2017  Jared Bass presents for follow up of opioid use disorder I have reviewed the prior induction visit, follow up visits, and telephone encounters relevant to opiate use disorder (OUD) treatment.   Current daily dose: Zubsolv 5.7-1.4 mg 2 tablets daily  Date of Induction: 01/22/2017  Current follow up interval, in weeks: 1 week    The patient has been adherent with the buprenorphine for OUD contract.   Last UDS Result: pending   HPI:   Patient is here for follow up after starting treatment for his opioid use disorder. He has chronic pain for 10 years. Prior to starting therapy, he was managing the chronic pain by obtaining illicit percosets.  Since last week, patient mentions that he has not used any percosets and has not obtained anything illicit including heroine. He denies any symptoms of withdrawal including diarrhea, irritability. He does endorses some insomnia after starting zubsolv, but says that his pain is better managed now. He also endorses some constipation. Overall, he is doing well. He denies any cravings.     Exam:   Vitals:   01/29/17 0835  BP: (!) 130/91  Pulse: 71  Temp: 98.3 F (36.8 C)  TempSrc: Oral  SpO2: 97%  Weight: 214 lb 1.6 oz (97.1 kg)   General: Vital signs reviewed. Patient in no acute distress Cardiovascular: regular rate, rhythm, no murmur appreciated  Pulmonary/Chest: Clear to auscultation bilaterally, no wheezes, rales, or rhonchi. Abdominal: Soft, non-tender, non-distended, BS + Extremities: No lower extremity edema bilaterally,  Focal neurologic exam: Normal sensory and motor strength. Has chronic dilation of right pupil due to injury in the past.    Assessment/Plan:  See Problem Based Charting in the Encounters Tab Case discussed with Dr. Criselda Peaches.     Deneise Lever, MD  01/29/2017  9:28 AM

## 2017-01-29 NOTE — Patient Instructions (Signed)
Thank you for your visit today.  Please continue to take the zubsolv tablets daily. Please do not take any oxocodone or percoset while you are on the zubsolv.  Please follow up in 1 week.

## 2017-02-01 NOTE — Progress Notes (Signed)
Internal Medicine Clinic Attending  I saw and evaluated the patient.  I personally confirmed the key portions of the history and exam documented by Dr. Johnny Bridge and I reviewed pertinent patient test results.  The assessment, diagnosis, and plan were formulated together and I agree with the documentation in the resident's note.  [name] is a 46yo man presenting for follow up moderate opioid use disorder in our Suboxone clinic.  He has been in treatment for 1 week.  He has been using high doses of oral narcotics for many  Years due to chronic pain in his back and hand.  He has not recently had a pain specialist and was obtaining narcotics by buying them.  Today, he is doing very well. His withdrawal symptoms are completely resolved.  He notes that his pain is also improved (I am curious if a large portion of his pain was related to withdrawal).  He is doing very well on 2 films per day.  His UDS from last week showed expected narcotics in the sample.  Will expect only buprenorphine going forward.  He did have a UDS today.  Overall, he feels this will be a successful treatment for him.  He did note daily constipation.  We will prescribe him miralax today.    His insurance pays for Zubsolv and he reports being able to afford the medication.    I provided him a 1 week supply of Zubsolv.  Follow up in 1 week.  I answered all questions.

## 2017-02-04 LAB — TOXASSURE SELECT,+ANTIDEPR,UR

## 2017-02-05 ENCOUNTER — Ambulatory Visit (INDEPENDENT_AMBULATORY_CARE_PROVIDER_SITE_OTHER): Payer: Medicare Other | Admitting: Student in an Organized Health Care Education/Training Program

## 2017-02-05 VITALS — BP 131/88 | HR 87 | Temp 98.7°F | Resp 20 | Wt 218.2 lb

## 2017-02-05 DIAGNOSIS — F1120 Opioid dependence, uncomplicated: Secondary | ICD-10-CM

## 2017-02-05 DIAGNOSIS — F411 Generalized anxiety disorder: Secondary | ICD-10-CM | POA: Diagnosis not present

## 2017-02-05 DIAGNOSIS — K0889 Other specified disorders of teeth and supporting structures: Secondary | ICD-10-CM | POA: Diagnosis not present

## 2017-02-05 MED ORDER — BUPRENORPHINE HCL-NALOXONE HCL 5.7-1.4 MG SL SUBL: SUBLINGUAL_TABLET | SUBLINGUAL | 0 refills | Status: DC

## 2017-02-05 MED ORDER — BUSPIRONE HCL 10 MG PO TABS: 10.0000 mg | ORAL_TABLET | Freq: Two times a day (BID) | ORAL | 2 refills | Status: DC

## 2017-02-05 MED ORDER — BUPRENORPHINE HCL-NALOXONE HCL 5.7-1.4 MG SL SUBL: 2.0000 | SUBLINGUAL_TABLET | Freq: Every day | SUBLINGUAL | 0 refills | Status: DC

## 2017-02-05 NOTE — Assessment & Plan Note (Signed)
Symptomatically much improved. He reports the Zubsolv is hepful for his chronic pain as well. Denies any other opioid use. I asked him about the u tox results, with positive benzo metabolites. He reports taking an occasional anxiety pill from his mother. Now that we are starting buspar he is agreeable to stop taking other medications. He does endorse night time cravings for opioids that are bothersome. Plan is to increase Zubsolv to 2.5 tabs daily, I gave him a one week supply. Follow up in one week for next refill.

## 2017-02-05 NOTE — Progress Notes (Signed)
   02/05/2017  Jared Bass presents for follow up of opioid use disorder I have reviewed the prior induction visit, follow up visits, and telephone encounters relevant to opiate use disorder (OUD) treatment.   Current daily dose: Zubsolv 11.4mg   Date of Induction: 01/22/2017  Current follow up interval, in weeks: 1  The patient has not been adherent with the buprenorphine for OUD contract.   Last UDS Result: Inappropriate for omazepam and temazepam  HPI: 46 year old man with moderate opioid use disorder her for follow up of medication assisted therapy. He reports doing well on zubsolv formulation. No side effect. No relapses, denies using any opioids. He does endorse persistent cravings, especially at night. Denies withdrawal symptoms. Eating and drinking well. He remains active, independent in all activities. No recent ED visits. He does endorse persistent anxiety, which can flare, he especially endorses road rage. He says his temper and anxiety are why he will take an anxiety pill which he obtains from family.   Exam:   Vitals:   02/05/17 0944  BP: 131/88  Pulse: 87  Resp: 20  Temp: 98.7 F (37.1 C)  TempSrc: Oral  SpO2: 96%  Weight: 218 lb 3.2 oz (99 kg)   Gen: well appearing, no distress ENT: poor dentition, MMM CV: RRR, no murmurs Lungs: clear throughout, no wheezing Ext: warm, normal joints, no track marks.  Assessment/Plan:  See Problem Based Charting in the Encounters Tab   Jared Alias, MD  02/05/2017  2:28 PM

## 2017-02-05 NOTE — Patient Instructions (Signed)
1. We are going to increase the Zubsolve to 2 and 1/2 tablets per day.   2. We will start Buspar, 10mg  twice a day. Please do not take any medications that are not provided to you by a physician.

## 2017-02-05 NOTE — Assessment & Plan Note (Signed)
Long history of psychiatric disorders that have been waxing and waning. Currently he reports severe anxiety. GAD 7 score of 16 today. Has tried SSRI but says it gives him headaches. He thinks he has bipolar disease, but I doubt that diagnosis given how well he is doing without any treatment. I advised against benzo therapy for now given substance use disorder history. He has liked buspar before, he took his sister's pills in the past. I think this is a reasonable medicine to try. Plan is to start buspar 10mg  bid, and can titrate up as needed.

## 2017-02-09 LAB — TOXASSURE SELECT,+ANTIDEPR,UR

## 2017-02-12 ENCOUNTER — Ambulatory Visit (INDEPENDENT_AMBULATORY_CARE_PROVIDER_SITE_OTHER): Payer: Medicare Other | Admitting: Student in an Organized Health Care Education/Training Program

## 2017-02-12 VITALS — BP 133/78 | HR 71 | Temp 98.5°F | Ht 68.0 in | Wt 214.2 lb

## 2017-02-12 DIAGNOSIS — F411 Generalized anxiety disorder: Secondary | ICD-10-CM | POA: Diagnosis not present

## 2017-02-12 DIAGNOSIS — F1120 Opioid dependence, uncomplicated: Secondary | ICD-10-CM | POA: Diagnosis present

## 2017-02-12 MED ORDER — BUPRENORPHINE HCL-NALOXONE HCL 5.7-1.4 MG SL SUBL: SUBLINGUAL_TABLET | SUBLINGUAL | 0 refills | Status: DC

## 2017-02-12 NOTE — Progress Notes (Signed)
   02/12/2017  Jared Bass presents for follow up of opioid use disorder I have reviewed the prior induction visit, follow up visits, and telephone encounters relevant to opiate use disorder (OUD) treatment.   Current daily dose: Zubsolv 11.4mg    Date of Induction: 01/22/2017  Current follow up interval, in weeks: 1  The patient has not been adherent with the buprenorphine for OUD contract.   Last UDS Result: Inappropriate. Very high alcohol level, persistent diazepam, may not be his sample as these metabolite levels match another patient in the OUD clinic.   HPI: 46 year old man here for follow-up of medication assisted therapy for moderate opioid use disorder. Patient reports doing well over the last 1 week. He denies using any opioids, no further elicits diazepam or other anxiety medications either. He reports compliance with buprenorphine. Endorses that his cravings are better controlled. Otherwise feeling well, no fevers or chills. Says that his chronic neck pain is well-controlled. He reports that the buprenorphine does given energy so he can say functional through the day. Some of his friends other family members do still continue to use opioids around him, which he struggles seeing. We talked about coping mechanisms, he has been open with his family that he is now on treatment.  Exam:   Vitals:   02/12/17 0930  BP: 133/78  Pulse: 71  Temp: 98.5 F (36.9 C)  TempSrc: Oral  SpO2: 98%  Weight: 214 lb 3.2 oz (97.2 kg)  Height: 5\' 8"  (1.727 m)    Gen: Well-appearing man, no distress Neck: Supple, no thyromegaly Extremities warm and well-perfused Neurologic: alert and oriented, conversational, normal gait and strength Psychiatric: Patient is calm, well put together, normal affect, normal mood   Assessment/Plan:  See Problem Based Charting in the Encounters Tab   [name], MD  02/12/2017  1:48 PM

## 2017-02-12 NOTE — Assessment & Plan Note (Signed)
Symptomatically stable. Just started Buspar on Friday and reports a mild headache which may be new. He denies using any further illicit diazepam this week. Plan is to continue with Buspar 10mg  bid for now, can increase dose next week if symptoms are not responding.

## 2017-02-12 NOTE — Assessment & Plan Note (Signed)
Reportedly stable symptoms, however the u tox screens have not yet become clean. I talked to him for a while about my concern that his reported use of alcohol and illicit diazepam do not match with the metabolite levels. I am suspicious that he is turning in urine samples that are not his own, which he denies. He reports that the urine sample will be clean today, he has had no illicit diazepam in over a week. Database reviewed and was appropriate. Plan to continue Zubsolv 2.5 tabs daily, I gave him a one week supply. Follow up in one week.

## 2017-02-17 LAB — TOXASSURE SELECT,+ANTIDEPR,UR

## 2017-02-19 ENCOUNTER — Encounter: Payer: Self-pay | Admitting: Internal Medicine

## 2017-02-19 ENCOUNTER — Other Ambulatory Visit: Payer: Self-pay | Admitting: *Deleted

## 2017-02-19 ENCOUNTER — Ambulatory Visit (INDEPENDENT_AMBULATORY_CARE_PROVIDER_SITE_OTHER): Payer: Medicare Other | Admitting: Internal Medicine

## 2017-02-19 DIAGNOSIS — F411 Generalized anxiety disorder: Secondary | ICD-10-CM | POA: Diagnosis not present

## 2017-02-19 DIAGNOSIS — F1120 Opioid dependence, uncomplicated: Secondary | ICD-10-CM

## 2017-02-19 DIAGNOSIS — Z79899 Other long term (current) drug therapy: Secondary | ICD-10-CM

## 2017-02-19 MED ORDER — BUPRENORPHINE HCL-NALOXONE HCL 5.7-1.4 MG SL SUBL: SUBLINGUAL_TABLET | SUBLINGUAL | 0 refills | Status: DC

## 2017-02-19 NOTE — Addendum Note (Signed)
Addended by: Rocco Pauls on: 02/19/2017 11:29 AM   Modules accepted: Orders

## 2017-02-19 NOTE — Patient Instructions (Signed)
Please continue taking your Zubsolv as prescribed.    We discussed your UDS at length today.  Hopefully the Diazepam will continue to decrease.  We can discuss further at next visit.   If UDS is appropriate, we may call you to schedule a 2 week appointment.  For now, schedule a 1 week appointment.   Thank you.

## 2017-02-19 NOTE — Assessment & Plan Note (Signed)
Buspar working well at current dose.  He is happy that we provided him with something that works, as he feels other providers in the past have ignored his symptoms. He feels the Buspar helps him to maintain control of his sobriety and his mood.   Plan Continue Buspar 10mg  BID (he is currently taking PRN).

## 2017-02-19 NOTE — Assessment & Plan Note (Signed)
Tavi has moderate use disorder.  He notes good control of cravings with current dose of Zubsolv.  He has no issues affording the medication.  He denies any illicit substances and we discussed his UDS at length today.  I believe he is being honest at this time as I asked him the same question 3 different ways and got consistent answers.  However, I did tell him that his urine would need to be appropriate this time to advance him to every other week visits.    Plan Continue Zubsolv 2 and half tablets daily UDS today - pending.

## 2017-02-19 NOTE — Progress Notes (Signed)
   02/19/2017  Jared Bass presents for follow up of opioid use disorder I have reviewed the prior induction visit, follow up visits, and telephone encounters relevant to opiate use disorder (OUD) treatment.   Current daily dose: 14.25mg  of buprenorphine  Date of Induction: 01/22/17  Current follow up interval, in weeks: 1 week  The patient has been adherent with the buprenorphine for OUD contract.   Last UDS Result: Reviewed.  Buprenorphine and metabolite as appropriate.  ETOH and metabolites of diazepam - persistent for 4 weeks  HPI: Jared Bass has moderate opioid use disorder.  Has only ever used pills, never injectable medications.  He has been abstinent from narcotics since induction.  He does report that over the last couple of weeks he has had increased stressors including his car breaking down and needing to pay quite a bit of money to get it fixed and also having issues with his food stamps.  These seem to be resolved.  We spoke quite a bit about coping mechanisms.  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  He uses these skills to help with setting his mind set and keeping in from giving in to hopelessness.  He has been prescribed Buspar previously from Dr. Oswaldo Done for anxiety and he is taking this as a PRN only. He reports that it helps well when he gets anxious.    I reviewed his UDS results in detail.  His urine has been amazingly consistent with ETOH and the metabolites of diazepam at low levels for 3 weeks now.  He reports his last use of diazepam was 4 weeks ago. After some research, diazepam can show up in the urine for up to 30 days. There is a possible chance these low levels of the metabolites could be due to slow elimination from previous use.  The levels do seem to be decreasing.  We discussed at length today.  Jared Bass admits to drinking with a previous girlfriend daily (red wine) which could explain the  persistent ETOH in the UDS.  He reports stopping this recently.  He also reported the 4 week ago Diazepam use.  He is concerned that his cousin may be tampering with his food (putting substances in his food) because he is "vindictive."  However, I think if he were taking diazepam more regularly, the metabolites would feasibly be at a higher level.  He is adamant that he has not purposefully taken any other substances from what is prescribed to him.  His urine today was warm and he denies using anyone else's urine.    [place] narcotic database reviewed and appropriate.   Exam:   Vitals:   02/19/17 0939  BP: (!) 128/100  Pulse: 67  Temp: 98.9 F (37.2 C)  TempSrc: Oral  SpO2: 99%  Weight: 212 lb (96.2 kg)  Height: 5\' 8"  (1.727 m)    General: Well dressed, alert, oriented CV: RR, NR, no murmur Pulm: CTAB, breathing comfortably MSK: No track marks, tattoos Psych: Pleasant, conversant.   Assessment/Plan:  See Problem Based Charting in the Encounters Tab     Inez Catalina, MD  02/19/2017  9:47 AM

## 2017-02-26 ENCOUNTER — Ambulatory Visit (INDEPENDENT_AMBULATORY_CARE_PROVIDER_SITE_OTHER): Payer: Medicare Other | Admitting: Internal Medicine

## 2017-02-26 ENCOUNTER — Telehealth (HOSPITAL_COMMUNITY): Payer: Self-pay

## 2017-02-26 VITALS — BP 130/86 | HR 67 | Temp 98.2°F | Resp 20 | Wt 209.7 lb

## 2017-02-26 DIAGNOSIS — F1120 Opioid dependence, uncomplicated: Secondary | ICD-10-CM

## 2017-02-26 DIAGNOSIS — F411 Generalized anxiety disorder: Secondary | ICD-10-CM

## 2017-02-26 DIAGNOSIS — Z79899 Other long term (current) drug therapy: Secondary | ICD-10-CM

## 2017-02-26 MED ORDER — BUPRENORPHINE HCL-NALOXONE HCL 5.7-1.4 MG SL SUBL: SUBLINGUAL_TABLET | SUBLINGUAL | 0 refills | Status: DC

## 2017-02-26 NOTE — Assessment & Plan Note (Signed)
Patient reports SE of dizziness with Buspar; he reports prior SE of HA with Prozac. Patient is worried about starting another SSRI/SNRI due to the risk of suicidal ideation and suicide. He thinks that getting treatment for his ADD with Adderall  will help his anxiety and help him sleep as this has been the case in the past. He is interested in seeking psychiatry referral for further management of his GAD. GAD-7 score is 15 today.  Plan: --d/c buspar --referral placed for psychiatry

## 2017-02-26 NOTE — Patient Instructions (Signed)
Please continue taking 2 1/2 of buprenorphine a day. We have referred you to psychiatry for further management of your anxiety.   We will see you in 2 weeks. Please call us if you need [place] sooner.

## 2017-02-26 NOTE — Progress Notes (Signed)
   02/26/2017  Jared Bass presents for follow up of opioid use disorder I have reviewed the prior induction visit, follow up visits, and telephone encounters relevant to opiate use disorder (OUD) treatment.   Current daily dose: 14.25mg  of buprenorphine  Date of Induction: 01/22/2017  Current follow up interval, in weeks: 1wk  The patient has been adherent with the buprenorphine for OUD contract.   Last UDS Result: pending; prior had decreasing quantities of diazepam metabolites and high conc of EtOH metabolites (patient reported daily alcohol intake).  HPI: Jared Bass is a 46yo male presenting for follow up of his moderate opioid use disorder. Patient states that his current dose of buprenorphine 2.5 pills per day is adequate; he reports some cravings, mainly when he is upset, but states they are manageable. He denies illicit drug use; he states he has overall stopped drinking wine - when he does he will drink about half a bottle with his significant other. He states he is glad he has been able to start working again over the last couple of months.  Patient was started on buspar for his GAD, which initially stated was helping, but now he reports dizziness on standing for 2-3 days after taking a dose, so he has discontinued the medication. He reports previously being on Prozac but had a SE of headache; he is worried about starting another SSRI/SNRI due to the risk of suicidal ideation and suicide. He thinks that getting treatment for his ADD with Adderall  will help his anxiety and help him sleep as this has been the case in the past. He is interested in seeking psychiatry referral for further management of his GAD. GAD-7 score is 15 today.  [place] controlled substances database reviewed today and appropriate.  Exam:   Vitals:   02/26/17 0840  BP: 130/86  Pulse: 67  Resp: 20  Temp: 98.2 F (36.8 C)  TempSrc: Oral  SpO2: 99%  Weight: 209 lb 11.2 oz (95.1 kg)   Constitutional: NAD,  pleasant CV: RRR, no MRG, pulses intact, no LE edema Resp: CTAB, no increased work of breathing Psych: A&Ox3, not anxious or depressed appearing  Assessment/Plan:  See Problem Based Charting in the Encounters Tab  Nyra Market, MD  02/26/2017  9:22 AM

## 2017-02-26 NOTE — Assessment & Plan Note (Signed)
Patient with mod opioid use disorder doing well on 14.25mg  of buprenorphine daily. He reports cravings occasionally that are manageable. He denies other illicit drug use; he is working on cutting down on alcohol use - has not had any EtOH in ~4 days and was previously drinking almost daily; when he does drink will have 1/2 a bottle. Unfortunately his UDS is not back yet today to go over, previous UDS results consistent with prior Valium use with consistently decreasing concentrations since first appointment; [place] controlled substances database reviewed today and appropriate.   Due to his overall good control of OUD, I think it is appropriate to move out his appointments to every 2 weeks for now.  Plan: --f/u [facility] UDS --Buprenorphine-naloxone 5.7-1.4mg  SL tabs, 2.5 per day #35 for two week supply --f/u in 2 weeks, sooner if needed or if UDS comes back inappropriate

## 2017-02-27 LAB — TOXASSURE SELECT,+ANTIDEPR,UR

## 2017-02-27 NOTE — Progress Notes (Signed)
Internal Medicine Clinic Attending  I saw and evaluated the patient.  I personally confirmed the key portions of the history and exam documented by Dr. Samuella Cota and I reviewed pertinent patient test results.  The assessment, diagnosis, and plan were formulated together and I agree with the documentation in the resident's note.  Jared Bass has moderate opiate use disorder and has been in treatment with [place] for 1 month.  He has been doing well.  His UDS has had persistent metabolites of diazepam and ETOH which he used to drink daily and is now down to intermittently.  His UDS from last week is still pending at this time.  He had side effects from the Buspar and took himself off of this medication.  He is amenable to psychiatric referral and this will be done today.  Overall, I think he is doing very well in treatment.  His pain is also well controlled, as are his cravings and symptoms of withdrawal.  He will be moved to 2 weeks appointments.  [place] narcotic database reviewed and appropriate.

## 2017-03-03 LAB — TOXASSURE SELECT,+ANTIDEPR,UR

## 2017-03-12 ENCOUNTER — Ambulatory Visit (INDEPENDENT_AMBULATORY_CARE_PROVIDER_SITE_OTHER): Payer: Medicare Other | Admitting: Internal Medicine

## 2017-03-12 VITALS — BP 129/83 | HR 64 | Temp 98.3°F | Resp 18 | Ht 68.0 in | Wt 207.2 lb

## 2017-03-12 DIAGNOSIS — F1120 Opioid dependence, uncomplicated: Secondary | ICD-10-CM

## 2017-03-12 DIAGNOSIS — F411 Generalized anxiety disorder: Secondary | ICD-10-CM

## 2017-03-12 DIAGNOSIS — K219 Gastro-esophageal reflux disease without esophagitis: Secondary | ICD-10-CM | POA: Diagnosis not present

## 2017-03-12 DIAGNOSIS — G8929 Other chronic pain: Secondary | ICD-10-CM | POA: Diagnosis not present

## 2017-03-12 MED ORDER — BUPRENORPHINE HCL-NALOXONE HCL 5.7-1.4 MG SL SUBL: SUBLINGUAL_TABLET | SUBLINGUAL | 0 refills | Status: DC

## 2017-03-12 MED ORDER — RANITIDINE HCL 150 MG PO TABS: 150.0000 mg | ORAL_TABLET | Freq: Two times a day (BID) | ORAL | 2 refills | Status: DC | PRN

## 2017-03-12 NOTE — Assessment & Plan Note (Addendum)
Patient is a 46 yo M here for follow up of his moderate opioid use disorder. Since his last visit, patient has developed cravings that start around 8 pm. He is currently taking 1 full tablet in the morning at 7-8 am when he wakes up, 1 full tablet at lunch around 12 -1 pm, and 1/2 tablet in the evenings around 5-6 pm. 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 He denies any worsening of his chronic pain, only cravings which are new. He denies any relapse or illicit substance use. Insurance continues to cover the cost of the buprenorphine in full. Today we discussed changing his dosing regimen to 1/2 tablet in the morning, 1 tablet in the afternoon with lunch, and 1 tablet in the evenings to help with his nighttime cravings. He is agreeable to to this plan. If he continues to have cravings despite this, dosing may need to be escalated to TID. Plan to reassess in 2 weeks.  -- F/u UDS -- [place] controlled substance database reviewed & appropriate -- Change to Zubsolv 5.7-1.4 mg tablets; 1/2 tab in AM, 1 tab with lunch, and 1 tab with dinner -- Prescription given, #35 tabs, 2 weeks supply  -- F/u 2 weeks   ADDENDUM: UDS appropriate for buprenorphine and metabolite.

## 2017-03-12 NOTE — Assessment & Plan Note (Signed)
Patient was previously referred to psychiatry for uncontrolled GAD, however when contacted to make an appointment patient declined. Today he reports transportation issues as the limiting factor for establishing with psychiatry. He currently has to borrow his cousin's car to get to his appointments.  -- Will continue to follow for support

## 2017-03-12 NOTE — Progress Notes (Signed)
   03/12/2017  Jared Bass presents for follow up of opioid use disorder I have reviewed the prior induction visit, follow up visits, and telephone encounters relevant to opiate use disorder (OUD) treatment.   Current daily dose: 14.25 mg buprenorphine   Date of Induction: 01/22/2017  Current follow up interval, in weeks: 2   The patient has been adherent with the buprenorphine for OUD contract.   Last UDS Result: Appropriate for Buprenorphine and metabolite. Persistently inappropriate for low levels of oxazepam, decreased from last week.   HPI: Patient is a 46 yo M here for follow up of his moderate opioid use disorder. Since his last visit, patient has developed cravings that start around 8 pm. He is currently taking 1 full tablet in the morning at 7-8 am when he wakes up, 1 full tablet at lunch around 12 -1 pm, and 1/2 tablet in the evenings with dinner around 5-6 pm. 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 He denies any worsening of his chronic pain, only cravings which are new. He denies any relapse or illicit substance use. Insurance continues to cover the cost of the buprenorphine in full.   Patient is also complaining of occasional heartburn that responds well to zantac. Requesting prescription. He is otherwise doing well.   Of note, patient does have a history of uncontrolled GAD for which he was previously on buspar, but stopped due to side effect of dizziness. He has also tried prozac which he failed due to side effects of headaches. He was referred to psychiatry at his last appointment but did not schedule an appointment due to transportation issues.   [place] controlled substance database was reviewed today and appropriate.   Exam:   Vitals:   03/12/17 0848  BP: 129/83  Pulse: 64  Resp: 18  Temp: 98.3 F (36.8 C)  TempSrc: Oral  SpO2: 99%  Weight: 207 lb 3.2 oz (94 kg)  Height: 5\' 8"  (1.727 m)    Physical  Exam Constitutional: NAD, appears comfortable Cardiovascular: RRR  Pulmonary/Chest: CTAB Extremities: Warm and well perfused.  No edema.  Skin: No rashes or erythema  Psychiatric: Normal mood and affect   Assessment/Plan:  See Problem Based Charting in the Encounters Tab   [name], MD  03/12/2017  9:20 AM

## 2017-03-12 NOTE — Patient Instructions (Addendum)
Mr. Tempest,  It was a pleasure to see you today. Please try taking only 1/2 tablet in the morning when you wake up, 1 full tablet at lunch, and 1 full tablet in the evening with dinner. Follow up with [place] in 2 weeks. I have sent a prescription for zantac to your pharmacy, please take this as needed for heartburn. If you have any questions or concerns, call our clinic at [phone] or after hours call [phone] and ask for the internal medicine resident on call. Thank you!  - Dr. Antony Contras

## 2017-03-12 NOTE — Assessment & Plan Note (Signed)
Patient is complaining of intermittent heartburn that responds well to OTC zantac, requesting prescription.  -- Zantac 150 mg BID prn

## 2017-03-17 LAB — TOXASSURE SELECT,+ANTIDEPR,UR

## 2017-03-25 NOTE — Addendum Note (Signed)
Addended by: Debe Coder B on: 03/25/2017 03:25 PM   Modules accepted: Level of Service

## 2017-03-25 NOTE — Progress Notes (Signed)
Internal Medicine Clinic Attending  I saw and evaluated the patient.  I personally confirmed the key portions of the history and exam documented by Dr. Antony Contras and I reviewed pertinent patient test results.  The assessment, diagnosis, and plan were formulated together and I agree with the documentation in the resident's note.  Jared Bass has been doing very well on suboxone therapy for his moderate OUD.  His last urine tox screen was appropriate.  The small amounts of BZD metabolite have resolved.  Likely related to long acting BZD.  He reports being unable to get to psychiatry at this time due to transportation, but will let us know when he can make it.  Requesting Rx of ranitidine to see if it is cheaper.  He is overall doing well.  [place] narcotic database appropriate.  F/U in 2 weeks.

## 2017-03-26 ENCOUNTER — Ambulatory Visit (INDEPENDENT_AMBULATORY_CARE_PROVIDER_SITE_OTHER): Payer: Medicare Other | Admitting: Internal Medicine

## 2017-03-26 ENCOUNTER — Other Ambulatory Visit: Payer: Self-pay | Admitting: Internal Medicine

## 2017-03-26 VITALS — BP 113/85 | HR 95 | Temp 99.4°F | Resp 20 | Wt 202.6 lb

## 2017-03-26 DIAGNOSIS — G8929 Other chronic pain: Secondary | ICD-10-CM

## 2017-03-26 DIAGNOSIS — F1120 Opioid dependence, uncomplicated: Secondary | ICD-10-CM | POA: Diagnosis present

## 2017-03-26 DIAGNOSIS — H5704 Mydriasis: Secondary | ICD-10-CM

## 2017-03-26 DIAGNOSIS — M4722 Other spondylosis with radiculopathy, cervical region: Secondary | ICD-10-CM

## 2017-03-26 DIAGNOSIS — H16009 Unspecified corneal ulcer, unspecified eye: Secondary | ICD-10-CM | POA: Insufficient documentation

## 2017-03-26 DIAGNOSIS — Z87828 Personal history of other (healed) physical injury and trauma: Secondary | ICD-10-CM

## 2017-03-26 DIAGNOSIS — M5412 Radiculopathy, cervical region: Principal | ICD-10-CM

## 2017-03-26 DIAGNOSIS — H16002 Unspecified corneal ulcer, left eye: Secondary | ICD-10-CM

## 2017-03-26 MED ORDER — GABAPENTIN 100 MG PO CAPS: 200.0000 mg | ORAL_CAPSULE | Freq: Two times a day (BID) | ORAL | 2 refills | Status: DC

## 2017-03-26 MED ORDER — BUPRENORPHINE HCL-NALOXONE HCL 5.7-1.4 MG SL SUBL: 1.0000 | SUBLINGUAL_TABLET | Freq: Three times a day (TID) | SUBLINGUAL | 0 refills | Status: DC

## 2017-03-26 MED ORDER — BUPRENORPHINE HCL-NALOXONE HCL 5.7-1.4 MG SL SUBL: 3.0000 | SUBLINGUAL_TABLET | Freq: Three times a day (TID) | SUBLINGUAL | 0 refills | Status: DC
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
## 2017-03-26 NOTE — Assessment & Plan Note (Addendum)
46 yo M here for follow up of his moderate opioid use disorder. Despite chaning his dosing regiment last visit to 1/2 tab in AM, 1 tab with lunch, and 1 tab with dinner patient has continued to experience persistent cravings that start around 7-8 pm in the evening. He denies illicit substance use and relapse. Today we discussed increasing dosage to 1 full tablet TID. Reassess in 2 weeks.  -- Increase Zubsolv 5.7-1.4 mg TID (#42 tabs, 2 week supply) -- F/u UDS -- [place] controlled substance database reviewed & refill history is appropriate   ADDENDUM: UDS appropriate for buprenorphine and metabolite, inappropriate for oxazepam.

## 2017-03-26 NOTE — Addendum Note (Signed)
Addended by: Erlinda Hong T on: 03/26/2017 09:48 AM   Modules accepted: Orders

## 2017-03-26 NOTE — Assessment & Plan Note (Signed)
Patient is complaining of worsening of his chronic neck pain with radiculopathy down his right arm. Requesting to restart gabapentin. Previously prescribed 800 mg QID.  -- Re start gabapentin 200 mg BID, up titrate as needed

## 2017-03-26 NOTE — Progress Notes (Signed)
Internal Medicine Clinic Attending  I saw and evaluated the patient.  I personally confirmed the key portions of the history and exam documented by Dr. Antony Contras and I reviewed pertinent patient test results.  The assessment, diagnosis, and plan were formulated together and I agree with the documentation in the resident's note.  Moderate OUD is in remission since 7/24 induction. He is doing very well, still having cravings, so we will increase Zubsolv dose to three tablets daily. If that works, we can increase the strength of the tablet in the future to reduce pill burden. We also addressed new complaints of left eye pain, which I think is a corneal ulcer from contact lense overuse, low risk for pseudomonas infection right now, will refer to ophthalmology and advised leaving the contacts out. Chronic radicular pain from cervical ostoearthritis is flaring, radiates down right arm, we will restart gabapentin, though at a much lower dose than his history, can titrate up as needed. Follow up intervals are two weeks right now. We referred him to psychiatry for counseling services, but he has not yet followed up with them.

## 2017-03-26 NOTE — Progress Notes (Deleted)
    03/26/2017  Jared Bass presents for follow up of opioid use disorder I have reviewed the prior induction visit, follow up visits, and telephone encounters relevant to opiate use disorder (OUD) treatment.   Current daily dose: ***  Date of Induction: ***  Current follow up interval, in weeks: ***  The patient {Has/has not:18111} been adherent with the buprenorphine for OUD contract.   Last UDS Result: ***  HPI: ***  Exam:   Vitals:   03/26/17 0850  BP: 113/85  Pulse: 95  Resp: 20  Temp: 99.4 F (37.4 C)  TempSrc: Oral  SpO2: 98%  Weight: 202 lb 9.6 oz (91.9 kg)    ***  Assessment/Plan:  See Problem Based Charting in the Encounters Tab     [name], MD  03/26/2017  9:12 AM

## 2017-03-26 NOTE — Patient Instructions (Addendum)
Mr. Bridewell,  It was a pleasure seeing you today. For your Zubsolv (buprenorphine), we have increased your dose to three full tablets a day. Please take one in the morning, one at lunch, and one in the evening. We have restarted gabapentin for your pain, please take two tablets twice a day. We have also referred you to an eye doctor, you will be contacted for an appointment. Please follow up with [place] in 2 weeks. Thank you!  - Dr. Antony Contras
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
## 2017-03-29 NOTE — Addendum Note (Signed)
Addended by: Dorie Rank E on: 03/29/2017 05:00 PM   Modules accepted: Orders

## 2017-04-01 LAB — TOXASSURE SELECT,+ANTIDEPR,UR

## 2017-04-09 ENCOUNTER — Ambulatory Visit (INDEPENDENT_AMBULATORY_CARE_PROVIDER_SITE_OTHER): Payer: Medicare Other | Admitting: Student in an Organized Health Care Education/Training Program

## 2017-04-09 VITALS — BP 121/80 | HR 67 | Temp 99.1°F | Resp 20 | Ht 68.0 in | Wt 200.6 lb

## 2017-04-09 DIAGNOSIS — F1120 Opioid dependence, uncomplicated: Secondary | ICD-10-CM

## 2017-04-09 DIAGNOSIS — M4802 Spinal stenosis, cervical region: Secondary | ICD-10-CM | POA: Diagnosis not present

## 2017-04-09 DIAGNOSIS — F411 Generalized anxiety disorder: Secondary | ICD-10-CM | POA: Diagnosis not present

## 2017-04-09 DIAGNOSIS — K219 Gastro-esophageal reflux disease without esophagitis: Secondary | ICD-10-CM | POA: Diagnosis not present

## 2017-04-09 MED ORDER — BUPRENORPHINE HCL-NALOXONE HCL 5.7-1.4 MG SL SUBL: 1.0000 | SUBLINGUAL_TABLET | Freq: Three times a day (TID) | SUBLINGUAL | 0 refills | Status: DC

## 2017-04-09 NOTE — Assessment & Plan Note (Signed)
Reflux symptoms were not improved with ranitidine, he felt immediate worse. Symptoms are very mild at this point. I advised that he monitor, and if they become more problematic we can try pantoprazole 20 mg daily, I advised he can purchase this over-the-counter.

## 2017-04-09 NOTE — Assessment & Plan Note (Signed)
Symptomatically stable. Did not tolerate buspar because of lightheadedness. Also had side effects to SNRIs in the past and does not want to retry. We have referred to psychiatry before, but he delayed making an appointment because of transportation. He is now able to see them, we gave him the number to call to make an appointment and he will follow up.

## 2017-04-09 NOTE — Assessment & Plan Note (Addendum)
Moderate Opioid Use disorder, currently doing very well on medication assisted therapy. He denies any recent relapse, cravings are better under control with more frequent dosing. Plan to continue Zubsolv 5.7mg  TID. U tox collected today, hopefully will be clear of benzo. Database reviewed and appropriate. Follow up in 2 weeks. Approaching ready for 4 week followup if u tox can be completely clean.

## 2017-04-09 NOTE — Assessment & Plan Note (Signed)
Chronic pain symptoms are stable and well controlled. Adding low-dose gabapentin has helped, will continue to 100 mg twice a day. Functional status currently is excellent.

## 2017-04-09 NOTE — Progress Notes (Signed)
   04/09/2017  Jared Bass presents for follow up of opioid use disorder I have reviewed the prior induction visit, follow up visits, and telephone encounters relevant to opiate use disorder (OUD) treatment.   Current daily dose: Zubsolv 5.7 TID  Date of Induction: 01/22/17  Current follow up interval, in weeks: 2  The patient has been adherent with the buprenorphine for OUD contract.   Last UDS Result: Mostly fine, small benzo metabolite present, Bupe present.  HPI: 46 year old man here for follow-up of moderate opioid use disorder. Patient reports doing very well on medication assisted therapy with buprenorphine. At last visit we increased his dosing to 3 times a day, and he reports more frequent dosing is helpful for curbing his craving. Reports being totally clean last 2 weeks. Denies illicit benzo use, says "well you told me not to." He is proud of his time being clean, 2.5 months clean at this point. He just got his transportation sorted out, and now is ready to make an appointment with psychiatry for his generalized anxiety, rage issues, and adhd.   He reports reflux is about the same, did not improve with zantac. Symptoms are mild, not really limiting him or bothering too much. Eye pain is fine, he is going to follow up with ophtho now that he has a car. Anxiety symptoms are the same, still does not want to retry SSRI, cymbalta, or buspar.  Exam:   Vitals:   04/09/17 0825  BP: 121/80  Pulse: 67  Resp: 20  Temp: 99.1 F (37.3 C)  TempSrc: Oral  SpO2: 99%  Weight: 200 lb 9.6 oz (91 kg)  Height: 5\' 8"  (1.727 m)    Gen: well appearing, no distress, comfortable ENT: poor dentition, left eye still mildly red, no drainage Psych: positive affect, smiling, well put together, good eye contact Neuro: normal strength and gait, normal EOM.   Assessment/Plan:  See Problem Based Charting in the Encounters Tab   [name], MD  04/09/2017  8:51 AM

## 2017-04-09 NOTE — Patient Instructions (Signed)
1. Continue your medications as prescribed.   2. If your reflux gets worse, you may purchase Prilosec 20mg  over the counter, and take one tablet once daily.

## 2017-04-16 LAB — TOXASSURE SELECT,+ANTIDEPR,UR

## 2017-04-18 DIAGNOSIS — E785 Hyperlipidemia, unspecified: Secondary | ICD-10-CM | POA: Diagnosis not present

## 2017-04-18 DIAGNOSIS — M545 Low back pain: Secondary | ICD-10-CM | POA: Diagnosis not present

## 2017-04-18 DIAGNOSIS — I10 Essential (primary) hypertension: Secondary | ICD-10-CM | POA: Diagnosis not present

## 2017-04-18 DIAGNOSIS — M542 Cervicalgia: Secondary | ICD-10-CM | POA: Diagnosis not present

## 2017-04-23 ENCOUNTER — Ambulatory Visit (INDEPENDENT_AMBULATORY_CARE_PROVIDER_SITE_OTHER): Payer: Medicare Other | Admitting: Internal Medicine

## 2017-04-23 DIAGNOSIS — H5789 Other specified disorders of eye and adnexa: Secondary | ICD-10-CM | POA: Diagnosis not present

## 2017-04-23 DIAGNOSIS — M4802 Spinal stenosis, cervical region: Secondary | ICD-10-CM

## 2017-04-23 DIAGNOSIS — H5461 Unqualified visual loss, right eye, normal vision left eye: Secondary | ICD-10-CM

## 2017-04-23 DIAGNOSIS — E559 Vitamin D deficiency, unspecified: Secondary | ICD-10-CM | POA: Diagnosis not present

## 2017-04-23 DIAGNOSIS — F1120 Opioid dependence, uncomplicated: Secondary | ICD-10-CM

## 2017-04-23 DIAGNOSIS — Z79899 Other long term (current) drug therapy: Secondary | ICD-10-CM

## 2017-04-23 DIAGNOSIS — I2699 Other pulmonary embolism without acute cor pulmonale: Secondary | ICD-10-CM | POA: Diagnosis not present

## 2017-04-23 DIAGNOSIS — I158 Other secondary hypertension: Secondary | ICD-10-CM | POA: Diagnosis not present

## 2017-04-23 DIAGNOSIS — R238 Other skin changes: Secondary | ICD-10-CM

## 2017-04-23 DIAGNOSIS — F411 Generalized anxiety disorder: Secondary | ICD-10-CM | POA: Diagnosis not present

## 2017-04-23 DIAGNOSIS — R079 Chest pain, unspecified: Secondary | ICD-10-CM | POA: Diagnosis not present

## 2017-04-23 DIAGNOSIS — E781 Pure hyperglyceridemia: Secondary | ICD-10-CM | POA: Diagnosis not present

## 2017-04-23 DIAGNOSIS — M545 Low back pain: Secondary | ICD-10-CM | POA: Diagnosis not present

## 2017-04-23 DIAGNOSIS — Z125 Encounter for screening for malignant neoplasm of prostate: Secondary | ICD-10-CM | POA: Diagnosis not present

## 2017-04-23 MED ORDER — BUPRENORPHINE HCL-NALOXONE HCL 5.7-1.4 MG SL SUBL: 1.0000 | SUBLINGUAL_TABLET | Freq: Three times a day (TID) | SUBLINGUAL | 0 refills | Status: DC

## 2017-04-23 NOTE — Addendum Note (Signed)
Addended by: Rocco Pauls on: 04/23/2017 11:05 AM   Modules accepted: Orders

## 2017-04-23 NOTE — Progress Notes (Signed)
   04/23/2017  Jared Bass presents for follow up of opioid use disorder I have reviewed the prior induction visit, follow up visits, and telephone encounters relevant to opiate use disorder (OUD) treatment.   Current daily dose: Zubsolv 5.7 TID  Date of Induction: 01/22/17  Current follow up interval, in weeks: 2 weeks  The patient has been adherent with the buprenorphine for OUD contract.   Last UDS Result: + for buprenorphine and ETOH with metabolites  HPI: 46yo man here for follow up of moderate opioid use disorder.  He is doing well.  Last UDS showed ETOH.  We discussed his alcohol use extensively today.  He reports social drinking, usually on the weekends and with friends.  He denies being a daily drinker or any binge drinking.  He reports about 2 glasses of wine 1-2 times per week and that he is careful not to drink more than this if he is driving.  He is taking his buprenorphine without issue and feels his cravings are well controlled.  He has not sought out Psychiatry yet, but plans to.  He has a job lined up and is excited to hear that he has been sober from opiates now for 3 months.  He feels the gabapentin is helping his pain and keeping the pain at about a 2/10.  Overall, he was very positive today and feels that he is doing well, anxiety symptoms are similar to previous.  I asked him about coping while starting a new job and if he felt he would get pressure or stress to go back to opiates, and he denied being concerned about this.  I did advise him to call if there were any increase in these issues once he returned to work.    He continues to have left eye irritation, erythema and sometimes drainage.  He continues to put contacts in, which I advised him against.  He is due to see an eye doctor on 11/1 and I strongly encouraged him to keep this appointment.    UDS reviewed with patient.   narcotic database reviewed and appropriate.    Exam:   Vitals:   04/23/17 0825  BP:  115/90  Pulse: 74  Resp: 20  Temp: 98.1 F (36.7 C)  TempSrc: Oral  SpO2: 97%  Weight: 202 lb 11.2 oz (91.9 kg)  Height: 5\' 8"  (1.727 m)    General: Pleasant, conversant, well dressed today Eyes: + erythema, clear drainage of left eye.  + pupil dilation of right eye with decreased vision Resp: Breathing comfortably, no SOB or wheezing noted Skin: Small skin breakdown on left arm from scratch, no track marks or rash on exposed skin Psych: Pleasant, normal mood.   Assessment/Plan:  See Problem Based Charting in the Encounters Tab     Inez Catalina, MD  04/23/2017  9:09 AM

## 2017-04-23 NOTE — Assessment & Plan Note (Signed)
He has been advised to follow up with psychiatry now that he has transportation.  He has not done this yet.  He does have work lined up.  We discussed the stress and pressure that may come with this and he does not seem worried at this time.  I did advise him to call the clinic in the interim if he feels he is not doing well and we will see him.   Plan He has been advised to schedule with psychiatry.

## 2017-04-23 NOTE — Assessment & Plan Note (Signed)
Reports that pain is well controlled on gabapentin down to a 2/10.  He is taking 200mg  BID and does not feel that he needs an increased dose.    Plan Continue Gabapentin 200mg  BID

## 2017-04-23 NOTE — Assessment & Plan Note (Addendum)
Well controlled on TID dosing of Zubsolv. He is able to get this medication through his insurance.   Plan Continue Zubsolv 5.7 TID U tox today.  Will advance to q 4 week follow up, if any relapse will return to q 1-2 weeks.

## 2017-04-23 NOTE — Patient Instructions (Signed)
Wavie - -  You are doing very well!  Please continue to use alcohol responsibly and not drink and drive.   Please come back to see [place] in 4 weeks.  If you have any issues in the meantime, please call us to speak with myself or Dr. Oswaldo Done.   Thank you!

## 2017-04-29 LAB — TOXASSURE SELECT,+ANTIDEPR,UR

## 2017-05-02 DIAGNOSIS — H21531 Iridodialysis, right eye: Secondary | ICD-10-CM | POA: Diagnosis not present

## 2017-05-02 DIAGNOSIS — H25811 Combined forms of age-related cataract, right eye: Secondary | ICD-10-CM | POA: Diagnosis not present

## 2017-05-02 DIAGNOSIS — H16402 Unspecified corneal neovascularization, left eye: Secondary | ICD-10-CM | POA: Diagnosis not present

## 2017-05-02 DIAGNOSIS — H52223 Regular astigmatism, bilateral: Secondary | ICD-10-CM | POA: Diagnosis not present

## 2017-05-02 DIAGNOSIS — H5212 Myopia, left eye: Secondary | ICD-10-CM | POA: Diagnosis not present

## 2017-05-02 DIAGNOSIS — H3129 Other hereditary choroidal dystrophy: Secondary | ICD-10-CM | POA: Diagnosis not present

## 2017-05-03 NOTE — Addendum Note (Signed)
Addended by: Neomia Dear on: 05/03/2017 05:06 PM   Modules accepted: Orders

## 2017-05-06 ENCOUNTER — Telehealth: Payer: Self-pay | Admitting: *Deleted

## 2017-05-06 NOTE — Telephone Encounter (Signed)
Tried to call to reschedule appt 11/20, no answer, no vmail

## 2017-05-08 ENCOUNTER — Telehealth: Payer: Self-pay

## 2017-05-08 NOTE — Telephone Encounter (Signed)
Needs to speak with a nurse about meds, please call back.  

## 2017-05-08 NOTE — Telephone Encounter (Signed)
#  2 call, no answer

## 2017-05-08 NOTE — Telephone Encounter (Signed)
Rtc, no vmailbox setup

## 2017-05-13 ENCOUNTER — Telehealth: Payer: Self-pay

## 2017-05-13 NOTE — Telephone Encounter (Signed)
Pt would like to know if he can come to oud tues 11/13, he is working and going out of town and does not want to "mess things up"

## 2017-05-13 NOTE — Telephone Encounter (Signed)
Yes, that is fine.  I think we are closed on 11/20 anyway which would be when he is due.   Thanks

## 2017-05-13 NOTE — Telephone Encounter (Signed)
Will be seen 11/13 at 0915

## 2017-05-13 NOTE — Telephone Encounter (Signed)
Would like Helen to call back.  

## 2017-05-14 ENCOUNTER — Ambulatory Visit (INDEPENDENT_AMBULATORY_CARE_PROVIDER_SITE_OTHER): Payer: Medicare Other | Admitting: Internal Medicine

## 2017-05-14 ENCOUNTER — Other Ambulatory Visit: Payer: Self-pay

## 2017-05-14 DIAGNOSIS — F1120 Opioid dependence, uncomplicated: Secondary | ICD-10-CM

## 2017-05-14 MED ORDER — BUPRENORPHINE HCL-NALOXONE HCL 5.7-1.4 MG SL SUBL: 1.0000 | SUBLINGUAL_TABLET | Freq: Three times a day (TID) | SUBLINGUAL | 0 refills | Status: DC

## 2017-05-14 NOTE — Progress Notes (Signed)
Internal Medicine Clinic Attending  I saw and evaluated the patient.  I personally confirmed the key portions of the history and exam documented by Dr. Mikey Bussing and I reviewed pertinent patient test results.  The assessment, diagnosis, and plan were formulated together and I agree with the documentation in the resident's note.  Patient here for follow-up of moderate opioid use disorder, currently doing well in sustained remission on medication assisted therapy.  We are seeing him every 4 weeks at this point.  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  We talked about ways to make sure that he still follows up with [place] does not run out of Buprenorphine early.  Plan will be to continue with Zubsolv 3 tablets daily.  Follow-up in 4 weeks.  Urine drug screen obtained today and I reviewed the controlled database which was appropriate.

## 2017-05-14 NOTE — Patient Instructions (Signed)
Jared Bass,  It was a pleasure meeting you today.  Keep up the good work.  Please follow up with the clinic in 4 weeks.

## 2017-05-14 NOTE — Progress Notes (Signed)
   05/14/2017  Jared Bass presents for follow up of opioid use disorder I have reviewed the prior induction visit, follow up visits, and telephone encounters relevant to opiate use disorder (OUD) treatment.   Current daily dose: Zubsolv 5.7 TID  Date of Induction: 01/22/17  Current follow up interval, in weeks: 1 month  The patient has been adherent with the buprenorphine for OUD contract.   Last UDS Result: 04/23/17: buprenorphine  HPI: 46 year old man here for follow-up of moderate opioid use disorder.  Patient reports using 3 tablets a day of Zubsolv daily.  He states this current regimen helps with cravings. He denies withdrawal symptoms or side effects of the medication. He states he will be out of town for 2-3 weeks for work, leaving this Friday. He denies any illicit drug use including opioids or benzodiazepines.  He states that the Neurontin is helping with his neck pain.  He reports loosing weight due to being more physically active.  He states overall he is doing very well.   Exam:   Vitals:   05/14/17 0920  BP: 129/85  Pulse: 68  Temp: 98.9 F (37.2 C)  TempSrc: Oral  SpO2: 99%  Weight: 197 lb 9.6 oz (89.6 kg)    Physical Exam  Constitutional: He is well-developed, well-nourished, and in no distress. No distress.  Cardiovascular: Normal rate, regular rhythm and normal heart sounds. Exam reveals no gallop and no friction rub.  No murmur heard. Pulmonary/Chest: Effort normal and breath sounds normal. No respiratory distress. He has no wheezes. He has no rales.  Skin: He is not diaphoretic.     Assessment/Plan:  See Problem Based Charting in the Encounters Tab   Camelia Phenes, DO  05/14/2017  10:40 AM

## 2017-05-14 NOTE — Assessment & Plan Note (Signed)
Assessment:  Opioid use disorder Currently well controlled on Zubsolv 5.7 TID.    11/6 UDS reviewed . [place] narcotic database reviewed and appropriate.    Plan -Continue Zubsolv 5.7 TID - Follow up in 4 weeks - UDS today

## 2017-05-21 LAB — TOXASSURE SELECT,+ANTIDEPR,UR

## 2017-06-01 IMAGING — US US EXTREM LOW VENOUS*L*
1 series · 13 of 24 positions shown · non-contrast
Comparison: None.

CLINICAL DATA: LEFT lower extremity swelling for 1 week.



[Series 1: us extrem low venous*left* · 0.09mm/px · 13 of 49 slices shown]
[im 1/49]
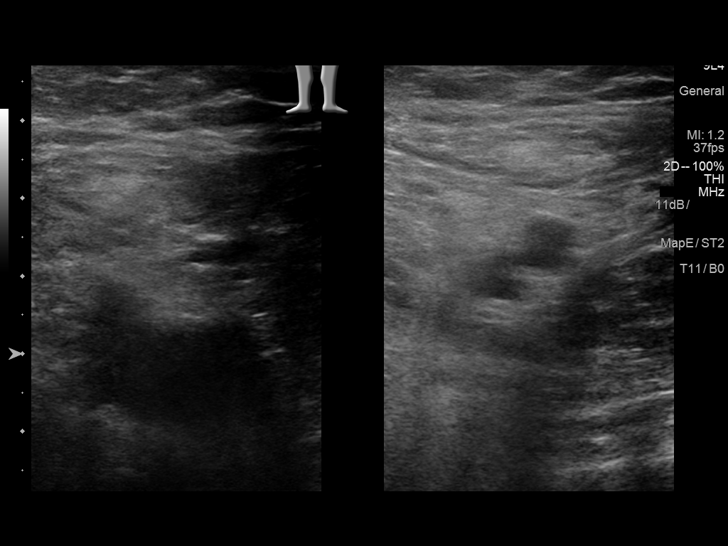
[im 5/49]
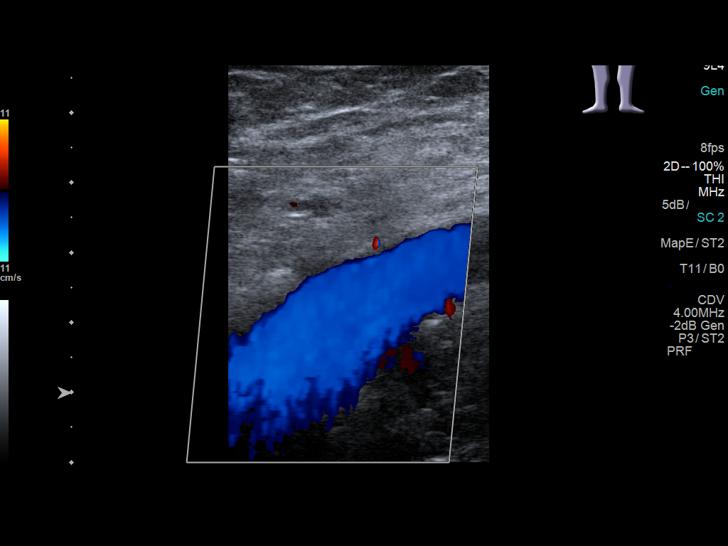
[im 9/49]
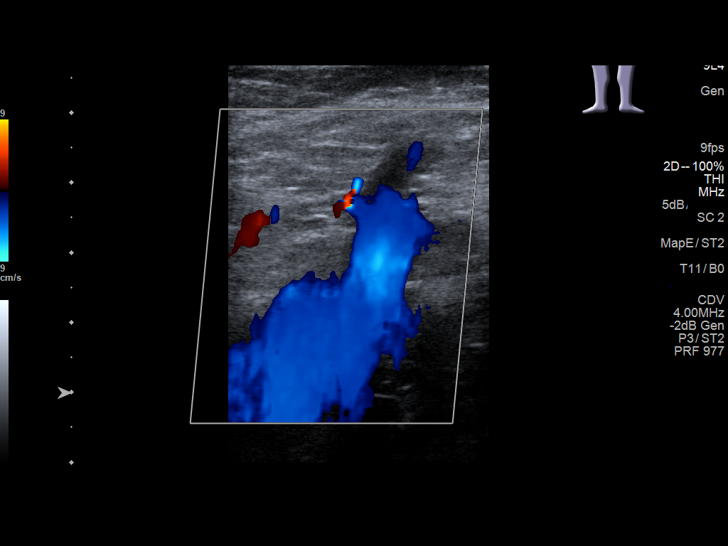
[im 13/49]
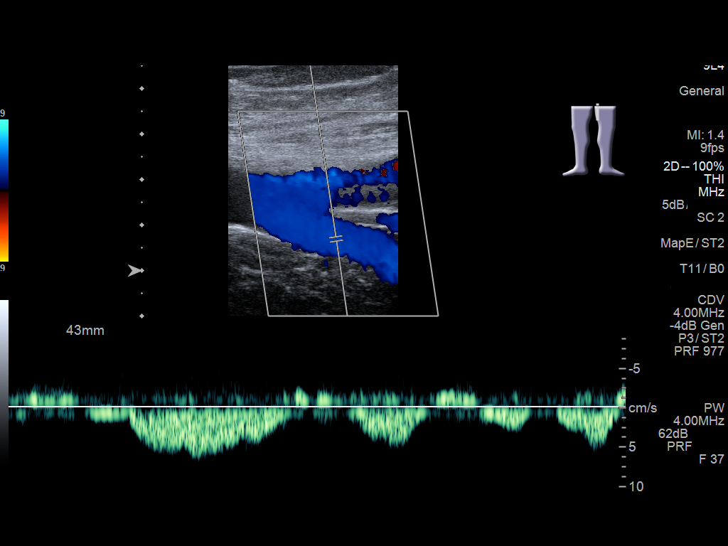
[im 17/49]
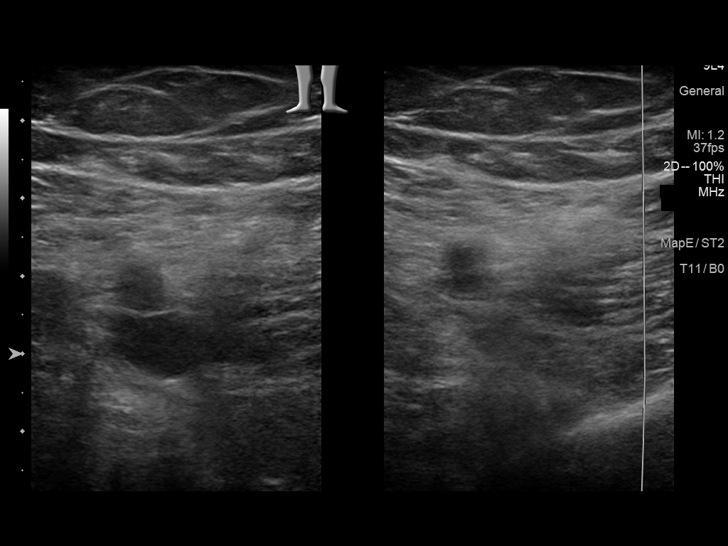
[im 21/49]
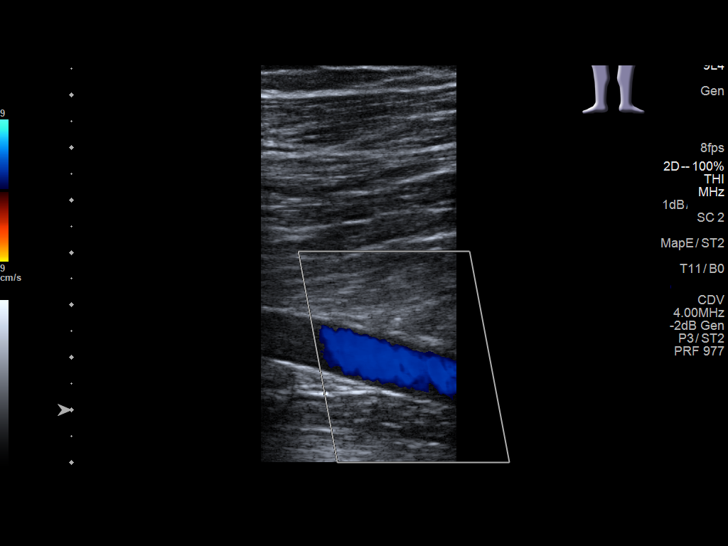
[im 26/49]
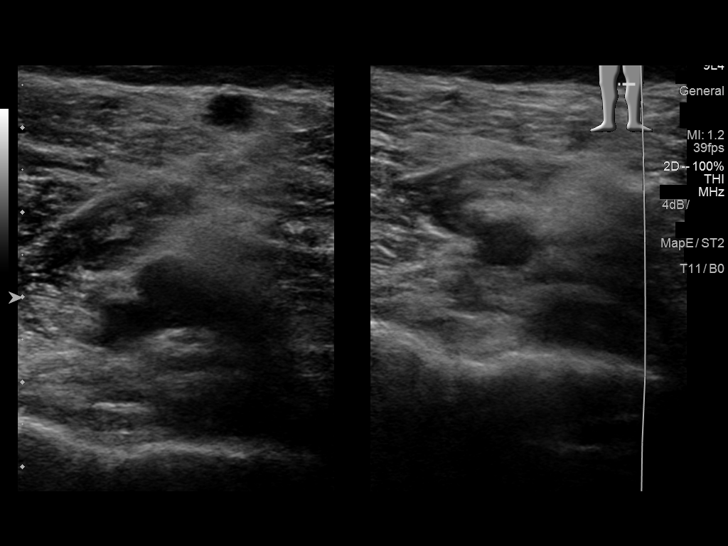
[im 28/49]
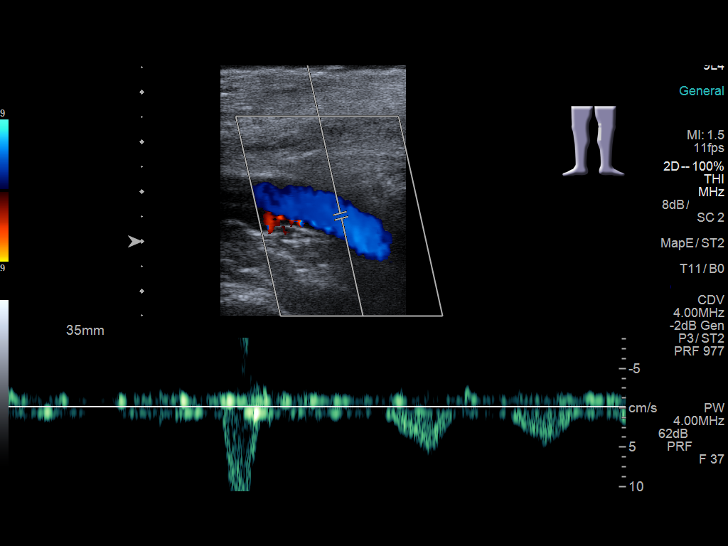
[im 32/49]
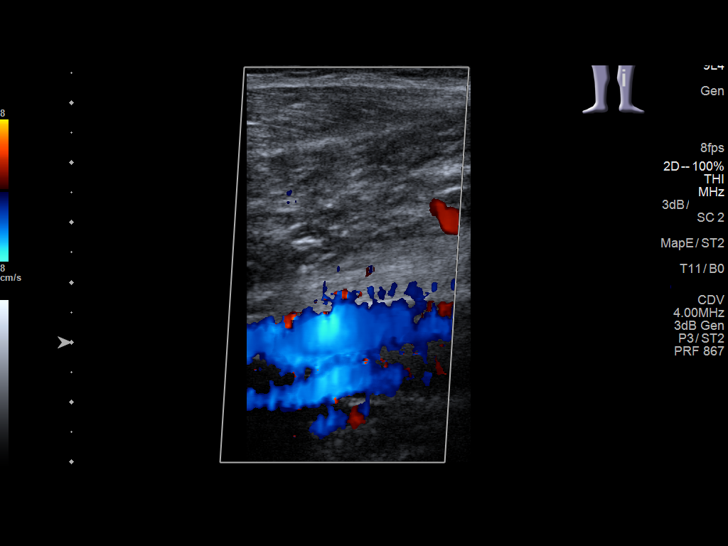
[im 36/49]
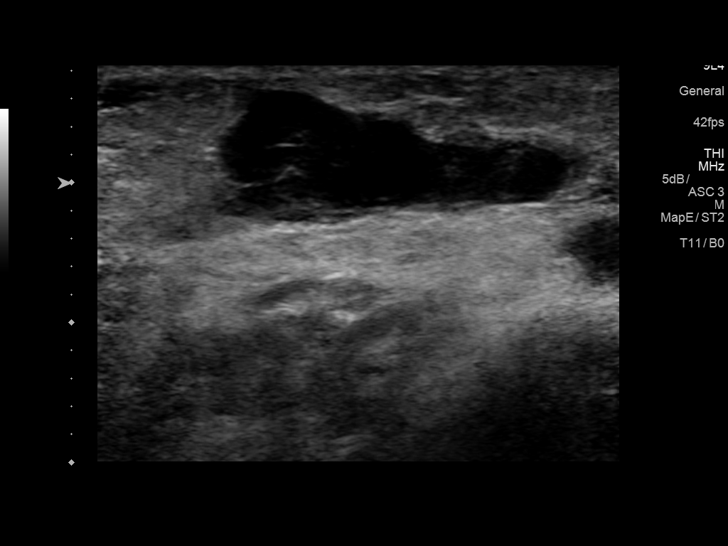
[im 40/49]
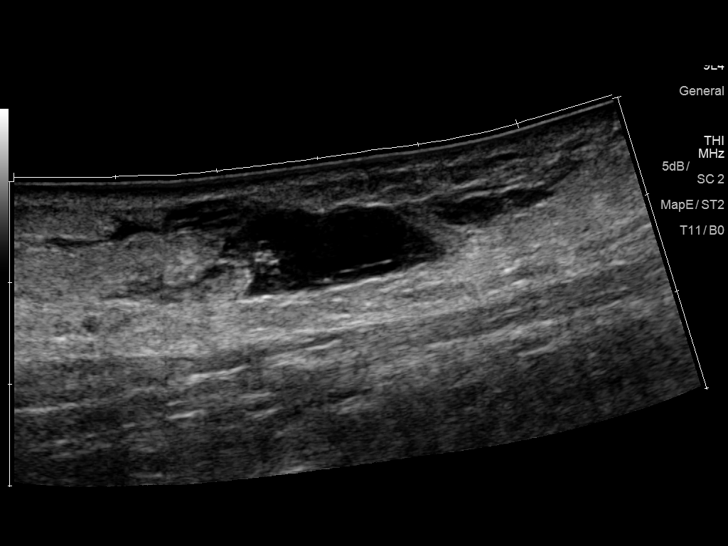
[im 44/49]
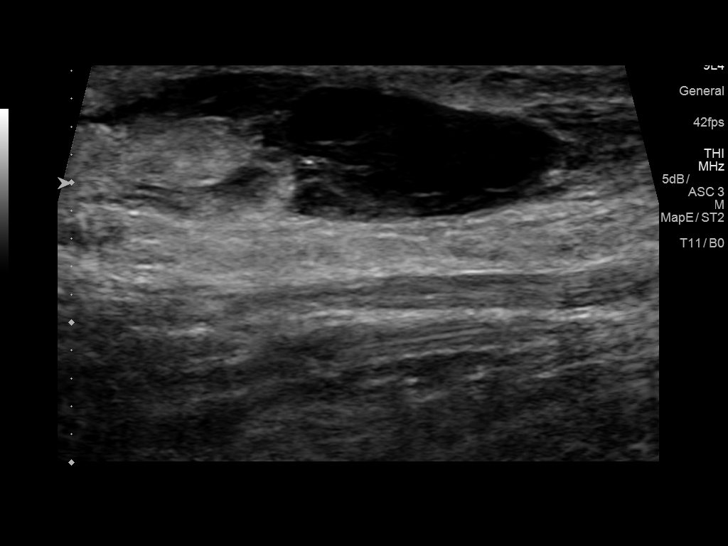
[im 49/49]
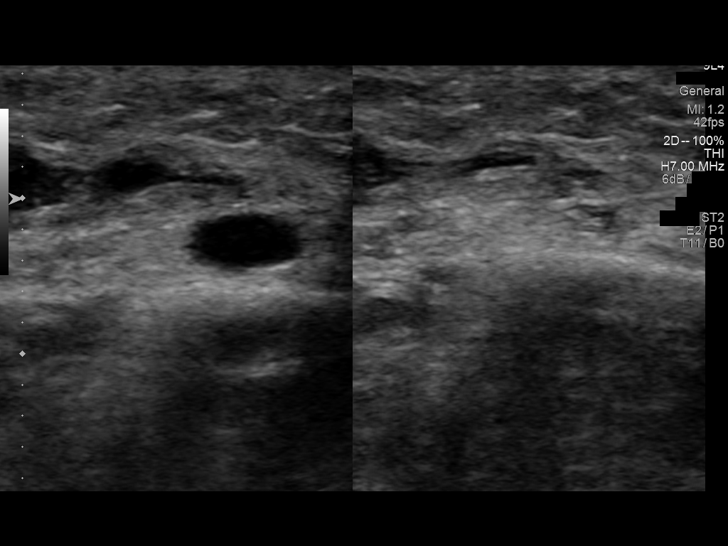

[13 of 24 positions shown; findings below may reference images not displayed]

FINDINGS: Contralateral Common Femoral Vein: Respiratory phasicity is normal
and symmetric with the symptomatic side. No evidence of thrombus.
Normal compressibility.

Common Femoral Vein: No evidence of thrombus. Normal
compressibility, respiratory phasicity and response to augmentation.

Profunda Femoral Vein: No evidence of thrombus. Normal
compressibility and flow on color Doppler imaging.

Femoral Vein: No evidence of thrombus. Normal compressibility,
respiratory phasicity and response to augmentation.

Popliteal Vein: No evidence of thrombus. Normal compressibility,
respiratory phasicity and response to augmentation.

Calf Veins: No evidence of thrombus. Normal compressibility and flow
on color Doppler imaging.

Superficial Great Saphenous Vein: No evidence of thrombus. Normal
compressibility and flow on color Doppler imaging.

Other Findings: 5.1 x 1 x 2.5 cm complex collection superficial soft
tissues at the level of the ankle.
IMPRESSION: No evidence of LEFT deep venous thrombosis.

5.1 x 1 x 2.5 cm superficial ankle fluid collection could represent
abscess or hematoma.

## 2017-06-30 ENCOUNTER — Emergency Department (HOSPITAL_COMMUNITY)
Admission: EM | Admit: 2017-06-30 | Discharge: 2017-06-30 | Disposition: A | Discharge status: Home / Self Care | Payer: Medicare Other | Attending: Physician Assistant | Admitting: Physician Assistant

## 2017-06-30 ENCOUNTER — Other Ambulatory Visit: Payer: Self-pay

## 2017-06-30 ENCOUNTER — Encounter (HOSPITAL_COMMUNITY): Payer: Self-pay

## 2017-06-30 ENCOUNTER — Emergency Department (HOSPITAL_COMMUNITY): Payer: Medicare Other

## 2017-06-30 DIAGNOSIS — I10 Essential (primary) hypertension: Secondary | ICD-10-CM | POA: Diagnosis not present

## 2017-06-30 DIAGNOSIS — R197 Diarrhea, unspecified: Secondary | ICD-10-CM | POA: Diagnosis not present

## 2017-06-30 DIAGNOSIS — R112 Nausea with vomiting, unspecified: Secondary | ICD-10-CM | POA: Insufficient documentation

## 2017-06-30 DIAGNOSIS — Z79899 Other long term (current) drug therapy: Secondary | ICD-10-CM | POA: Insufficient documentation

## 2017-06-30 DIAGNOSIS — J181 Lobar pneumonia, unspecified organism: Secondary | ICD-10-CM | POA: Insufficient documentation

## 2017-06-30 DIAGNOSIS — Z7901 Long term (current) use of anticoagulants: Secondary | ICD-10-CM | POA: Insufficient documentation

## 2017-06-30 DIAGNOSIS — J189 Pneumonia, unspecified organism: Secondary | ICD-10-CM

## 2017-06-30 DIAGNOSIS — J069 Acute upper respiratory infection, unspecified: Secondary | ICD-10-CM | POA: Diagnosis not present

## 2017-06-30 DIAGNOSIS — J188 Other pneumonia, unspecified organism: Secondary | ICD-10-CM | POA: Diagnosis not present

## 2017-06-30 DIAGNOSIS — B9789 Other viral agents as the cause of diseases classified elsewhere: Secondary | ICD-10-CM | POA: Diagnosis not present

## 2017-06-30 DIAGNOSIS — R05 Cough: Secondary | ICD-10-CM | POA: Diagnosis not present

## 2017-06-30 MED ORDER — BENZONATATE 100 MG PO CAPS: 100.0000 mg | ORAL_CAPSULE | Freq: Three times a day (TID) | ORAL | 0 refills | Status: DC

## 2017-06-30 MED ORDER — AZITHROMYCIN 250 MG PO TABS: 250.0000 mg | ORAL_TABLET | Freq: Every day | ORAL | 0 refills | Status: DC

## 2017-06-30 MED ORDER — IBUPROFEN 200 MG PO TABS
600.0000 mg | ORAL_TABLET | Freq: Once | ORAL | Status: AC
  Administered 2017-06-30: 19:00:00 600 mg via ORAL
  Filled 2017-06-30: qty 1

## 2017-06-30 NOTE — ED Provider Notes (Signed)
[facility] Provider Note   CSN: [id number] Arrival date & time: 06/30/17  1728     History   Chief Complaint Chief Complaint  Patient presents with  . Influenza    HPI  Jared Bass is a 46 y.o. Male history of PE currently on Eliquis, A. fib, chronic neck pain and right eye injury, presents today complaining of 1 week of generalized body aches, cough congestion and rhinorrhea.  Reports some associated subjective fevers and chills.  Patient reports symptoms have been present and worsening over the past week, patient has been taking vitamin C, has not been doing anything else at home to treat the symptoms.  Patient reports cough productive of mucus.  Some associated sore throat, no ear pain, no neck pain or stiffness.  No headaches.  Patient denies any chest pain or shortness of breath, no hemoptysis.  No lower extremity swelling.  Patient reports generalized body aches but no focal pain.  Reports some intermittent nausea and vomiting as well as a few episodes of diarrhea, nonbloody, denies any abdominal pain.  Patient reports despite this he has been able to keep down food and plenty of fluids.      Past Medical History:  Diagnosis Date  . Acute renal failure (HCC)   . Acute respiratory failure (HCC)    2017  . Acute respiratory failure with hypoxia (HCC)   . Acute saddle pulmonary embolism with acute cor pulmonale (HCC)   . Chronic neck pain   . Closed fracture of nasal bone   . DDD (degenerative disc disease), cervical 05/04/2015  . Faintness   . Hemoptysis   . History of atrial fibrillation   . History of head injury 05/04/2015  . History of tachycardia 05/04/2015  . Hypertension   . Hypotension   . Pulmonary emboli (HCC)    on elequis  . Pulmonary embolism (HCC)   . Right eye injury   . Saddle pulmonary embolus (HCC) 12/13/2015  . Syncope and collapse   . Tachycardia 12/20/2015    Patient Active Problem List   Diagnosis Date  Noted  . GERD (gastroesophageal reflux disease) 03/12/2017  . Opioid use disorder, moderate, dependence (HCC) 01/22/2017  . Cervical spinal stenosis 05/04/2015  . Cervical foraminal stenosis (right side) 05/04/2015  . Generalized anxiety disorder 05/04/2015    Past Surgical History:  Procedure Laterality Date  . neck fusion     c5-7  . TONSILLECTOMY         Home Medications    Prior to Admission medications   Medication Sig Start Date End Date Taking? Authorizing Provider  apixaban (ELIQUIS) 5 MG TABS tablet Take 1 tablet (5 mg total) by mouth 2 (two) times daily. 10/15/16   Enedina FinnerPatel, Sona, MD  Buprenorphine HCl-Naloxone HCl (ZUBSOLV) 5.7-1.4 MG SUBL Place 1 tablet 3 (three) times daily under the tongue. 05/14/17   Tyson AliasVincent, Duncan Thomas, MD  gabapentin (NEURONTIN) 100 MG capsule Take 2 capsules (200 mg total) by mouth 2 (two) times daily. 03/26/17 03/26/18  Tyson AliasVincent, Duncan Thomas, MD  polyethylene glycol (MIRALAX / Ethelene HalGLYCOLAX) packet TAKE 17 G BY MOUTH DAILY. 03/26/17   Tyson AliasVincent, Duncan Thomas, MD  propranolol (INDERAL) 10 MG tablet 10 mg 3 (three) times daily.  01/14/16   [provider]    Family History Family History  Problem Relation Age of Onset  . Hypertension Mother   . COPD Mother   . Mesothelioma Father   . COPD Father   . Peripheral  vascular disease Brother        Clots in his arteries surgically removed    Social History Social History   Tobacco Use  . Smoking status: Never Smoker  . Smokeless tobacco: Never Used  Substance Use Topics  . Alcohol use: No    Alcohol/week: 0.0 oz  . Drug use: No     Allergies   Tramadol and Other   Review of Systems Review of Systems  Constitutional: Positive for chills and fever.  HENT: Positive for congestion, postnasal drip, rhinorrhea and sore throat. Negative for ear discharge, ear pain, sinus pressure, sinus pain, trouble swallowing and voice change.   Eyes: Negative for discharge, redness and itching.    Respiratory: Positive for cough. Negative for chest tightness, shortness of breath, wheezing and stridor.   Cardiovascular: Negative for chest pain, palpitations and leg swelling.  Gastrointestinal: Positive for diarrhea, nausea and vomiting. Negative for abdominal pain.  Genitourinary: Negative for dysuria.  Musculoskeletal: Positive for myalgias. Negative for arthralgias, neck pain and neck stiffness.  Skin: Negative for rash.  Neurological: Negative for dizziness, light-headedness and headaches.     Physical Exam Updated Vital Signs BP 121/83 (BP Location: Right Arm)   Pulse 84   Temp 100.1 F (37.8 C) (Oral)   Resp 16   Ht 5\' 8"  (1.727 m)   Wt 92.1 kg (203 lb)   SpO2 98%   BMI 30.87 kg/m   Physical Exam  Constitutional: He is oriented to person, place, and time. He appears well-developed and well-nourished. No distress.  HENT:  Head: Normocephalic and atraumatic.  TMs clear with good landmarks, moderate nasal mucosa edema with clear rhinorrhea, posterior oropharynx clear and moist, with some erythema, no edema or exudates, uvula midline, no trismus  Eyes: Right eye exhibits no discharge. Left eye exhibits no discharge.  Neck: Normal range of motion. Neck supple.  Cardiovascular: Normal rate, regular rhythm, normal heart sounds and intact distal pulses.  Pulmonary/Chest: Effort normal and breath sounds normal. No stridor. No respiratory distress. He has no wheezes. He has no rales.  Abdominal: Soft. Bowel sounds are normal. He exhibits no distension and no mass. There is no tenderness. There is no guarding.  Musculoskeletal: He exhibits no edema or deformity.  Lymphadenopathy:    He has no cervical adenopathy.  Neurological: He is alert and oriented to person, place, and time. Coordination normal.  Skin: Skin is warm and dry. Capillary refill takes less than 2 seconds. He is not diaphoretic.  Psychiatric: He has a normal mood and affect. His behavior is normal.  Nursing  note and vitals reviewed.    ED Treatments / Results  Labs (all labs ordered are listed, but only abnormal results are displayed) Labs Reviewed - No data to display  EKG  EKG Interpretation None       Radiology Dg Chest 2 View  Result Date: 06/30/2017 CLINICAL DATA:  Flu, not feeling well with cough. EXAM: CHEST  2 VIEW COMPARISON:  July 26, 2016 FINDINGS: The heart size and mediastinal contours are within normal limits. Mild patchy opacity of the lateral left lung base is noted. There is no pulmonary edema or pleural effusion. The visualized skeletal structures are stable. IMPRESSION: Mild patchy opacity of the lateral left lung base, developing pneumonia is not excluded. Electronically Signed   By: Sherian Rein M.D.   On: 06/30/2017 20:15    Procedures Procedures (including critical care time)  Medications Ordered in ED Medications  ibuprofen (ADVIL,MOTRIN) tablet 600 mg (  600 mg Oral Given 06/30/17 1907)     Initial Impression / Assessment and Plan / ED Course  I have reviewed the triage vital signs and the nursing notes.  Pertinent labs & imaging results that were available during my care of the patient were reviewed by me and considered in my medical decision making (see chart for details).  Presents with 1 week of URI symptoms with some low-grade fevers.  Patient has been diagnosed with CAP via chest xray. Pt is not ill appearing, immunocompromised, and does not have multiple co morbidities, therefore I feel like the they can be treated as an OP with abx therapy.  Prescription for azithromycin, as well as Tessalon Perles provided pt has been advised to return to the ED if symptoms worsen or they do not improve. Pt verbalizes understanding and is agreeable with plan.    Final Clinical Impressions(s) / ED Diagnoses   Final diagnoses:  Viral upper respiratory tract infection  Community acquired pneumonia of left lower lobe of lung [facility])    ED Discharge Orders         Ordered    azithromycin (ZITHROMAX) 250 MG tablet  Daily     06/30/17 2024    benzonatate (TESSALON) 100 MG capsule  Every 8 hours     06/30/17 2024       Dartha Lodge, PA-C 06/30/17 2045    Abelino Derrick, MD 07/02/17 2206

## 2017-06-30 NOTE — Discharge Instructions (Addendum)
Please complete entire course of antibiotics as discussed even if symptoms completely resolve.  Your chest x-ray shows mild pneumonia, these typically respond well to antibiotics and resolved.  Please follow-up with your primary care doctor next week to ensure this is improving.  Continue to treat your symptoms supportively as well, may use the cough medication provided, ibuprofen and Tylenol as needed for pain or fever, drink plenty of fluids.  If you have persistent fevers, chest pain, shortness of breath or difficulty breathing or other new or concerning symptoms develop please return to the ED for sooner reevaluation

## 2017-06-30 NOTE — ED Triage Notes (Signed)
Pt reports body aches, nasal congestion, fevers/chills, cough x 1 week,

## 2017-07-04 ENCOUNTER — Encounter (HOSPITAL_COMMUNITY): Payer: Self-pay | Admitting: Emergency Medicine

## 2017-07-04 ENCOUNTER — Emergency Department (HOSPITAL_COMMUNITY): Payer: Medicare Other

## 2017-07-04 DIAGNOSIS — I4891 Unspecified atrial fibrillation: Secondary | ICD-10-CM | POA: Insufficient documentation

## 2017-07-04 DIAGNOSIS — R509 Fever, unspecified: Secondary | ICD-10-CM | POA: Diagnosis not present

## 2017-07-04 DIAGNOSIS — Z825 Family history of asthma and other chronic lower respiratory diseases: Secondary | ICD-10-CM | POA: Diagnosis not present

## 2017-07-04 DIAGNOSIS — Z888 Allergy status to other drugs, medicaments and biological substances status: Secondary | ICD-10-CM | POA: Insufficient documentation

## 2017-07-04 DIAGNOSIS — Z86711 Personal history of pulmonary embolism: Secondary | ICD-10-CM | POA: Diagnosis not present

## 2017-07-04 DIAGNOSIS — E876 Hypokalemia: Secondary | ICD-10-CM | POA: Diagnosis not present

## 2017-07-04 DIAGNOSIS — Z79899 Other long term (current) drug therapy: Secondary | ICD-10-CM | POA: Diagnosis not present

## 2017-07-04 DIAGNOSIS — Z8249 Family history of ischemic heart disease and other diseases of the circulatory system: Secondary | ICD-10-CM | POA: Insufficient documentation

## 2017-07-04 DIAGNOSIS — J181 Lobar pneumonia, unspecified organism: Principal | ICD-10-CM | POA: Insufficient documentation

## 2017-07-04 DIAGNOSIS — Z801 Family history of malignant neoplasm of trachea, bronchus and lung: Secondary | ICD-10-CM | POA: Insufficient documentation

## 2017-07-04 DIAGNOSIS — G8929 Other chronic pain: Secondary | ICD-10-CM | POA: Insufficient documentation

## 2017-07-04 DIAGNOSIS — Z7901 Long term (current) use of anticoagulants: Secondary | ICD-10-CM | POA: Diagnosis not present

## 2017-07-04 DIAGNOSIS — I10 Essential (primary) hypertension: Secondary | ICD-10-CM | POA: Insufficient documentation

## 2017-07-04 DIAGNOSIS — Z9109 Other allergy status, other than to drugs and biological substances: Secondary | ICD-10-CM | POA: Insufficient documentation

## 2017-07-04 DIAGNOSIS — R05 Cough: Secondary | ICD-10-CM | POA: Insufficient documentation

## 2017-07-04 DIAGNOSIS — R0602 Shortness of breath: Secondary | ICD-10-CM | POA: Diagnosis not present

## 2017-07-04 DIAGNOSIS — Z981 Arthrodesis status: Secondary | ICD-10-CM | POA: Diagnosis not present

## 2017-07-04 DIAGNOSIS — R0609 Other forms of dyspnea: Secondary | ICD-10-CM | POA: Diagnosis present

## 2017-07-04 LAB — COMPREHENSIVE METABOLIC PANEL
ALT: 71 U/L — AB (ref 17–63)
AST: 101 U/L — AB (ref 15–41)
Albumin: 3.4 g/dL — ABNORMAL LOW (ref 3.5–5.0)
Alkaline Phosphatase: 53 U/L (ref 38–126)
Anion gap: 13 (ref 5–15)
BUN: 12 mg/dL (ref 6–20)
CALCIUM: 8.6 mg/dL — AB (ref 8.9–10.3)
CHLORIDE: 92 mmol/L — AB (ref 101–111)
CO2: 28 mmol/L (ref 22–32)
CREATININE: 0.71 mg/dL (ref 0.61–1.24)
GFR calc non Af Amer: >60 mL/min (ref >60)
Glucose, Bld: 107 mg/dL — ABNORMAL HIGH (ref 65–99)
Potassium: 3.2 mmol/L — ABNORMAL LOW (ref 3.5–5.1)
SODIUM: 133 mmol/L — AB (ref 135–145)
Total Bilirubin: 1.4 mg/dL — ABNORMAL HIGH (ref 0.3–1.2)
Total Protein: 8 g/dL (ref 6.5–8.1)

## 2017-07-04 LAB — CBC WITH DIFFERENTIAL/PLATELET
BASOS PCT: 1 %
Basophils Absolute: 0 10*3/uL (ref 0.0–0.1)
EOS ABS: 0 10*3/uL (ref 0.0–0.7)
EOS PCT: 1 %
HCT: 51 % (ref 39.0–52.0)
Hemoglobin: 17.2 g/dL — ABNORMAL HIGH (ref 13.0–17.0)
LYMPHS ABS: 1.3 10*3/uL (ref 0.7–4.0)
Lymphocytes Relative: 30 %
MCH: 30.3 pg (ref 26.0–34.0)
MCHC: 33.7 g/dL (ref 30.0–36.0)
MCV: 89.9 fL (ref 78.0–100.0)
MONO ABS: 0.7 10*3/uL (ref 0.1–1.0)
Monocytes Relative: 17 %
NEUTROS ABS: 2.3 10*3/uL (ref 1.7–7.7)
Neutrophils Relative %: 51 %
PLATELETS: 130 10*3/uL — AB (ref 150–400)
RBC: 5.67 MIL/uL (ref 4.22–5.81)
RDW: 13.3 % (ref 11.5–15.5)
WBC: 4.3 10*3/uL (ref 4.0–10.5)

## 2017-07-04 LAB — I-STAT CG4 LACTIC ACID, ED: LACTIC ACID, VENOUS: 1.33 mmol/L (ref 0.5–1.9)

## 2017-07-04 MED ORDER — ALBUTEROL SULFATE (2.5 MG/3ML) 0.083% IN NEBU
INHALATION_SOLUTION | RESPIRATORY_TRACT | Status: AC
  Administered 2017-07-04: 20:00:00
  Filled 2017-07-04: qty 6

## 2017-07-04 NOTE — ED Triage Notes (Signed)
Pt seen here 12/30, dx with URI and CAP. Pt states he has seen no improvement in his S/S. Pt reports cough, SOB, fever/chills, N/V.

## 2017-07-04 NOTE — ED Notes (Signed)
Pt came in through triage doors requesting an update. Informed pt he needs to return to lobby and inquire with NF about time

## 2017-07-05 ENCOUNTER — Telehealth: Payer: Self-pay | Admitting: *Deleted

## 2017-07-05 ENCOUNTER — Other Ambulatory Visit: Payer: Self-pay

## 2017-07-05 ENCOUNTER — Observation Stay (HOSPITAL_COMMUNITY)
Admission: EM | Admit: 2017-07-05 | Discharge: 2017-07-06 | Disposition: A | Discharge status: Home / Self Care | Payer: Medicare Other | Attending: Internal Medicine | Admitting: Internal Medicine

## 2017-07-05 DIAGNOSIS — I2692 Saddle embolus of pulmonary artery without acute cor pulmonale: Secondary | ICD-10-CM

## 2017-07-05 DIAGNOSIS — G894 Chronic pain syndrome: Secondary | ICD-10-CM | POA: Diagnosis not present

## 2017-07-05 DIAGNOSIS — I480 Paroxysmal atrial fibrillation: Secondary | ICD-10-CM

## 2017-07-05 DIAGNOSIS — J181 Lobar pneumonia, unspecified organism: Secondary | ICD-10-CM | POA: Diagnosis not present

## 2017-07-05 DIAGNOSIS — J189 Pneumonia, unspecified organism: Secondary | ICD-10-CM | POA: Diagnosis present

## 2017-07-05 MED ORDER — LEVOFLOXACIN IN D5W 750 MG/150ML IV SOLN
750.0000 mg | Freq: Every day | INTRAVENOUS | Status: DC
  Administered 2017-07-06: 750 mg via INTRAVENOUS
  Filled 2017-07-05: qty 150

## 2017-07-05 MED ORDER — ACETAMINOPHEN 325 MG PO TABS: 650.0000 mg | ORAL_TABLET | Freq: Four times a day (QID) | ORAL | Status: DC | PRN

## 2017-07-05 MED ORDER — ONDANSETRON HCL 4 MG PO TABS: 4.0000 mg | ORAL_TABLET | Freq: Four times a day (QID) | ORAL | Status: DC | PRN

## 2017-07-05 MED ORDER — DEXTROSE 5 % IV SOLN
500.0000 mg | Freq: Once | INTRAVENOUS | Status: AC
  Administered 2017-07-05: 500 mg via INTRAVENOUS
  Filled 2017-07-05: qty 500

## 2017-07-05 MED ORDER — DEXTROSE 5 % IV SOLN
1.0000 g | Freq: Once | INTRAVENOUS | Status: AC
  Administered 2017-07-05: 1 g via INTRAVENOUS
  Filled 2017-07-05: qty 10

## 2017-07-05 MED ORDER — BUPRENORPHINE HCL-NALOXONE HCL 8-2 MG SL SUBL
1.0000 | SUBLINGUAL_TABLET | Freq: Every day | SUBLINGUAL | Status: DC
  Administered 2017-07-05 – 2017-07-06 (×2): 1 via SUBLINGUAL
  Filled 2017-07-05 (×2): qty 1

## 2017-07-05 MED ORDER — SENNOSIDES-DOCUSATE SODIUM 8.6-50 MG PO TABS: 1.0000 | ORAL_TABLET | Freq: Every evening | ORAL | Status: DC | PRN

## 2017-07-05 MED ORDER — PROPRANOLOL HCL 10 MG PO TABS
10.0000 mg | ORAL_TABLET | Freq: Three times a day (TID) | ORAL | Status: DC
  Administered 2017-07-05 – 2017-07-06 (×4): 10 mg via ORAL
  Filled 2017-07-05 (×5): qty 1

## 2017-07-05 MED ORDER — APIXABAN 5 MG PO TABS
5.0000 mg | ORAL_TABLET | Freq: Two times a day (BID) | ORAL | Status: DC
  Administered 2017-07-05 – 2017-07-06 (×3): 5 mg via ORAL
  Filled 2017-07-05 (×3): qty 1

## 2017-07-05 MED ORDER — POTASSIUM CHLORIDE CRYS ER 20 MEQ PO TBCR
40.0000 meq | EXTENDED_RELEASE_TABLET | Freq: Once | ORAL | Status: AC
  Administered 2017-07-05: 40 meq via ORAL
  Filled 2017-07-05: qty 2

## 2017-07-05 MED ORDER — ONDANSETRON HCL 4 MG/2ML IJ SOLN: 4.0000 mg | Freq: Four times a day (QID) | INTRAMUSCULAR | Status: DC | PRN

## 2017-07-05 MED ORDER — SODIUM CHLORIDE 0.9 % IV SOLN
INTRAVENOUS | Status: DC
  Administered 2017-07-05 – 2017-07-06 (×2): via INTRAVENOUS

## 2017-07-05 MED ORDER — POTASSIUM CHLORIDE CRYS ER 20 MEQ PO TBCR
40.0000 meq | EXTENDED_RELEASE_TABLET | Freq: Once | ORAL | Status: DC
  Filled 2017-07-05: qty 2

## 2017-07-05 MED ORDER — INFLUENZA VAC SPLIT QUAD 0.5 ML IM SUSY: 0.5000 mL | PREFILLED_SYRINGE | INTRAMUSCULAR | Status: DC

## 2017-07-05 MED ORDER — DM-GUAIFENESIN ER 30-600 MG PO TB12
1.0000 | ORAL_TABLET | Freq: Two times a day (BID) | ORAL | Status: DC
  Administered 2017-07-05 – 2017-07-06 (×3): 1 via ORAL
  Filled 2017-07-05 (×3): qty 1

## 2017-07-05 MED ORDER — GABAPENTIN 100 MG PO CAPS
200.0000 mg | ORAL_CAPSULE | Freq: Two times a day (BID) | ORAL | Status: DC
  Administered 2017-07-05 – 2017-07-06 (×3): 200 mg via ORAL
  Filled 2017-07-05 (×3): qty 2

## 2017-07-05 MED ORDER — POLYETHYLENE GLYCOL 3350 17 G PO PACK
17.0000 g | PACK | Freq: Every day | ORAL | Status: DC
  Administered 2017-07-06: 17 g via ORAL
  Filled 2017-07-05 (×2): qty 1

## 2017-07-05 MED ORDER — IPRATROPIUM-ALBUTEROL 0.5-2.5 (3) MG/3ML IN SOLN
3.0000 mL | Freq: Four times a day (QID) | RESPIRATORY_TRACT | Status: DC
  Administered 2017-07-05 (×3): 3 mL via RESPIRATORY_TRACT
  Filled 2017-07-05 (×3): qty 3

## 2017-07-05 MED ORDER — ACETAMINOPHEN 650 MG RE SUPP: 650.0000 mg | Freq: Four times a day (QID) | RECTAL | Status: DC | PRN

## 2017-07-05 NOTE — Telephone Encounter (Signed)
Per hme, would I call pt to change appt to Tuesday, appt for oud scheduled 1/8 at 0945 Called pt several times then it was noted he is in [place] at this time, called him and gave him the message, took appt card to the ED and gave to pt

## 2017-07-05 NOTE — ED Notes (Signed)
Regular lunch tray ordered @ 1138

## 2017-07-05 NOTE — ED Notes (Signed)
Handoff report given

## 2017-07-05 NOTE — Progress Notes (Signed)
Pharmacy Antibiotic Note  Jared Bass is a 47 y.o. male admitted on 07/05/2017 with pneumonia.  Pharmacy has been consulted for Levaquin dosing. Received 1x doses of ceftriaxone/azithro this AM at ~0600. SCr 0.71 on 1/3, CrCl>100.  Plan: Levaquin 750mg  IV q24h to start 1/5 Monitor clinical progress, c/s, renal function F/u de-escalation plan/LOT   Height: 5\' 8"  (172.7 cm) Weight: 200 lb (90.7 kg) IBW/kg (Calculated) : 68.4  Temp (24hrs), Avg:98.4 F (36.9 C), Min:98.3 F (36.8 C), Max:98.5 F (36.9 C)  Recent Labs  Lab 07/04/17 1929 07/04/17 1947  WBC 4.3  --   CREATININE 0.71  --   LATICACIDVEN  --  1.33    Estimated Creatinine Clearance: 123.5 mL/min (by C-G formula based on SCr of 0.71 mg/dL).    Allergies  Allergen Reactions  . Tramadol Anaphylaxis and Rash  . Other Other (See Comments)    Rembrant Whitening strips  "fat lips"   Babs Bertin, PharmD, BCPS Clinical Pharmacist 07/05/2017 8:03 AM

## 2017-07-05 NOTE — ED Provider Notes (Signed)
[facility] EMERGENCY DEPARTMENT Provider Note   CSN: [id number] Arrival date & time: 07/04/17  1807     History   Chief Complaint Chief Complaint  Patient presents with  . Flu Like S/S    HPI Jared Bass is a 47 y.o. male.  Patient presents to the emergency department for evaluation of worsening cough.  Patient was evaluated on December 30 for progressively worsening cough, chest congestion, shortness of breath.  At that time he was diagnosed with pneumonia.  Patient was placed on Zithromax.  He has been taking the Zithromax but his symptoms are worsening.  Patient reports intermittent fever at home.  He has noticed severe shortness of breath with minimal ambulation which also causes increasing coughing.      Past Medical History:  Diagnosis Date  . Acute renal failure (HCC)   . Acute respiratory failure (HCC)    2017  . Acute respiratory failure with hypoxia (HCC)   . Acute saddle pulmonary embolism with acute cor pulmonale (HCC)   . Chronic neck pain   . Closed fracture of nasal bone   . DDD (degenerative disc disease), cervical 05/04/2015  . Faintness   . Hemoptysis   . History of atrial fibrillation   . History of head injury 05/04/2015  . History of tachycardia 05/04/2015  . Hypertension   . Hypotension   . Pulmonary emboli (HCC)    on elequis  . Pulmonary embolism (HCC)   . Right eye injury   . Saddle pulmonary embolus (HCC) 12/13/2015  . Syncope and collapse   . Tachycardia 12/20/2015    Patient Active Problem List   Diagnosis Date Noted  . GERD (gastroesophageal reflux disease) 03/12/2017  . Opioid use disorder, moderate, dependence (HCC) 01/22/2017  . Cervical spinal stenosis 05/04/2015  . Cervical foraminal stenosis (right side) 05/04/2015  . Generalized anxiety disorder 05/04/2015    Past Surgical History:  Procedure Laterality Date  . neck fusion     c5-7  . TONSILLECTOMY         Home Medications    Prior to Admission  medications   Medication Sig Start Date End Date Taking? Authorizing Provider  apixaban (ELIQUIS) 5 MG TABS tablet Take 1 tablet (5 mg total) by mouth 2 (two) times daily. 10/15/16   Enedina Finner, MD  azithromycin (ZITHROMAX) 250 MG tablet Take 1 tablet (250 mg total) by mouth daily. Take first 2 tablets together, then 1 every day until finished. 06/30/17   Dartha Lodge, PA-C  benzonatate (TESSALON) 100 MG capsule Take 1 capsule (100 mg total) by mouth every 8 (eight) hours. 06/30/17   Dartha Lodge, PA-C  Buprenorphine HCl-Naloxone HCl (ZUBSOLV) 5.7-1.4 MG SUBL Place 1 tablet 3 (three) times daily under the tongue. 05/14/17   [name], MD  gabapentin (NEURONTIN) 100 MG capsule Take 2 capsules (200 mg total) by mouth 2 (two) times daily. 03/26/17 03/26/18  [name], MD  polyethylene glycol (MIRALAX / Ethelene Hal) packet TAKE 17 G BY MOUTH DAILY. 03/26/17   [name], MD  propranolol (INDERAL) 10 MG tablet 10 mg 3 (three) times daily.  01/14/16   [provider]    Family History Family History  Problem Relation Age of Onset  . Hypertension Mother   . COPD Mother   . Mesothelioma Father   . COPD Father   . Peripheral vascular disease Brother        Clots in his arteries surgically removed  Social History Social History   Tobacco Use  . Smoking status: Never Smoker  . Smokeless tobacco: Never Used  Substance Use Topics  . Alcohol use: No    Alcohol/week: 0.0 oz  . Drug use: No     Allergies   Tramadol and Other   Review of Systems Review of Systems  Respiratory: Positive for cough and shortness of breath.   All other systems reviewed and are negative.    Physical Exam Updated Vital Signs BP 119/81 (BP Location: Right Arm)   Pulse 91   Temp 98.3 F (36.8 C) (Oral)   Resp 16   Ht 5\' 8"  (1.727 m)   Wt 90.7 kg (200 lb)   SpO2 94%   BMI 30.41 kg/m   Physical Exam  Constitutional: He is oriented to person, place, and  time. He appears well-developed and well-nourished. No distress.  HENT:  Head: Normocephalic and atraumatic.  Right Ear: Hearing normal.  Left Ear: Hearing normal.  Nose: Nose normal.  Mouth/Throat: Oropharynx is clear and moist and mucous membranes are normal.  Eyes: Conjunctivae and EOM are normal. Pupils are equal, round, and reactive to light.  Neck: Normal range of motion. Neck supple.  Cardiovascular: Regular rhythm, S1 normal and S2 normal. Exam reveals no gallop and no friction rub.  No murmur heard. Pulmonary/Chest: Effort normal. No respiratory distress. He has decreased breath sounds in the left lower field. He has rhonchi in the left lower field. He exhibits no tenderness.  Abdominal: Soft. Normal appearance and bowel sounds are normal. There is no hepatosplenomegaly. There is no tenderness. There is no rebound, no guarding, no tenderness at McBurney's point and negative Murphy's sign. No hernia.  Musculoskeletal: Normal range of motion.  Neurological: He is alert and oriented to person, place, and time. He has normal strength. No cranial nerve deficit or sensory deficit. Coordination normal. GCS eye subscore is 4. GCS verbal subscore is 5. GCS motor subscore is 6.  Skin: Skin is warm, dry and intact. No rash noted. No cyanosis.  Psychiatric: He has a normal mood and affect. His speech is normal and behavior is normal. Thought content normal.  Nursing note and vitals reviewed.    ED Treatments / Results  Labs (all labs ordered are listed, but only abnormal results are displayed) Labs Reviewed  COMPREHENSIVE METABOLIC PANEL - Abnormal; Notable for the following components:      Result Value   Sodium 133 (*)    Potassium 3.2 (*)    Chloride 92 (*)    Glucose, Bld 107 (*)    Calcium 8.6 (*)    Albumin 3.4 (*)    AST 101 (*)    ALT 71 (*)    Total Bilirubin 1.4 (*)    All other components within normal limits  CBC WITH DIFFERENTIAL/PLATELET - Abnormal; Notable for the  following components:   Hemoglobin 17.2 (*)    Platelets 130 (*)    All other components within normal limits  CULTURE, BLOOD (ROUTINE X 2)  CULTURE, BLOOD (ROUTINE X 2)  CULTURE, EXPECTORATED SPUTUM-ASSESSMENT  I-STAT CG4 LACTIC ACID, ED  I-STAT CG4 LACTIC ACID, ED    EKG  EKG Interpretation None       Radiology Dg Chest 2 View  Result Date: 07/04/2017 CLINICAL DATA:  Upper respiratory infection.  Cough. EXAM: CHEST  2 VIEW COMPARISON:  June 30, 2017 FINDINGS: Worsening patchy opacity in the left base. The heart, hila, and mediastinum are normal. No pneumothorax. The opacity  projects over the heart on the lateral view suggesting lingular involvement, not seen on the comparison. IMPRESSION: Worsening infiltrate in the left base and lingula. The findings are most consistent with worsening pneumonia. Electronically Signed   By: Gerome Sam III M.D   On: 07/04/2017 19:51    Procedures Procedures (including critical care time)  Medications Ordered in ED Medications  potassium chloride SA (K-DUR,KLOR-CON) CR tablet 40 mEq (not administered)  cefTRIAXone (ROCEPHIN) 1 g in dextrose 5 % 50 mL IVPB (not administered)  azithromycin (ZITHROMAX) 500 mg in dextrose 5 % 250 mL IVPB (not administered)  albuterol (PROVENTIL) (2.5 MG/3ML) 0.083% nebulizer solution (  Given 07/04/17 1951)     Initial Impression / Assessment and Plan / ED Course  I have reviewed the triage vital signs and the nursing notes.  Pertinent labs & imaging results that were available during my care of the patient were reviewed by me and considered in my medical decision making (see chart for details).     Patient presents to the emergency department for repeat evaluation.  Patient was seen December 30 and diagnosed with pneumonia by x-ray.  Patient has been taking the Zithromax as prescribed but is worsening.  His chest x-ray today shows worsening progression of pneumonia.  Patient is very dyspneic with minimal  ambulation.  He was walked in the hallway here, oxygen saturation dropped to 91% and his heart rate went up to 110.  This is consistent with failure of outpatient therapy, will require hospitalization for IV antibiotic therapy.  Final Clinical Impressions(s) / ED Diagnoses   Final diagnoses:  Community acquired pneumonia of left lower lobe of lung [facility])    ED Discharge Orders    None       Pollina, Canary Brim, MD 07/05/17 [phone]

## 2017-07-05 NOTE — H&P (Signed)
History and Physical    Jared Bass:[id number] DOB: 07-Jan-1971 DOA: 07/05/2017  PCP: Sherrie Mustache, MD Patient coming from: home  Chief Complaint: fevers and cough  HPI: Jared Bass is a 47 y.o. male with medical history significant of chronic pain, pulmonary embolism, paroxysmal atrial fibrillation came to the ER for evaluation of shortness of breath and fever.  Patient states for the past 3 weeks he has not been feeling well and during this course he has developed progressive shortness of breath, productive cough and fevers.  He was seen here in the ER on June 30, 2017 and was diagnosed with pneumonia.  He was discharged on azithromycin.  Despite of taking azithromycin for 5 days he continued to feel febrile at home, progressive shortness of breath and productive cough-bringing up thick whitish sputum.  Thinks he may have had sick contact at home, denies taking flu shot this year. Denies any tobacco use, drinks alcohol only socially, denies any illicit drug use  In the ER patient looked in slight distress due to his above-mentioned symptoms.  His labs showed potassium of 3.2, rest of the labs were unremarkable.  Chest x-ray showed worsening of the left side pneumonia.  He was admitted due to failure of outpatient treatment.   Review of Systems: As per HPI otherwise 10 point review of systems negative.   Past Medical History:  Diagnosis Date  . Acute renal failure (HCC)   . Acute respiratory failure (HCC)    2017  . Acute respiratory failure with hypoxia (HCC)   . Acute saddle pulmonary embolism with acute cor pulmonale (HCC)   . Chronic neck pain   . Closed fracture of nasal bone   . DDD (degenerative disc disease), cervical 05/04/2015  . Faintness   . Hemoptysis   . History of atrial fibrillation   . History of head injury 05/04/2015  . History of tachycardia 05/04/2015  . Hypertension   . Hypotension   . Pulmonary emboli (HCC)    on elequis  . Pulmonary embolism (HCC)     . Right eye injury   . Saddle pulmonary embolus (HCC) 12/13/2015  . Syncope and collapse   . Tachycardia 12/20/2015    Past Surgical History:  Procedure Laterality Date  . neck fusion     c5-7  . TONSILLECTOMY       reports that  has never smoked. he has never used smokeless tobacco. He reports that he does not drink alcohol or use drugs.  Allergies  Allergen Reactions  . Tramadol Anaphylaxis and Rash  . Other Other (See Comments)    Rembrant Whitening strips  "fat lips"    Family History  Problem Relation Age of Onset  . Hypertension Mother   . COPD Mother   . Mesothelioma Father   . COPD Father   . Peripheral vascular disease Brother        Clots in his arteries surgically removed     Prior to Admission medications   Medication Sig Start Date End Date Taking? Authorizing Provider  apixaban (ELIQUIS) 5 MG TABS tablet Take 1 tablet (5 mg total) by mouth 2 (two) times daily. 10/15/16  Yes Enedina Finner, MD  Buprenorphine HCl-Naloxone HCl (ZUBSOLV) 5.7-1.4 MG SUBL Place 1 tablet 3 (three) times daily under the tongue. 05/14/17  Yes [name], MD  gabapentin (NEURONTIN) 100 MG capsule Take 2 capsules (200 mg total) by mouth 2 (two) times daily. 03/26/17 03/26/18 Yes [name], MD  polyethylene  glycol (MIRALAX / GLYCOLAX) packet TAKE 17 G BY MOUTH DAILY. Patient taking differently: Take 17 g by mouth daily as needed for mild constipation.  03/26/17  Yes [name], MD  propranolol (INDERAL) 10 MG tablet Take 10 mg by mouth 3 (three) times daily.  01/14/16  Yes [provider]    Physical Exam: Vitals:   07/04/17 1911 07/05/17 0055 07/05/17 0439  BP: 121/90 121/86 119/81  Pulse: 92 81 91  Resp: 18 18 16   Temp: 98.5 F (36.9 C)  98.3 F (36.8 C)  TempSrc: Oral  Oral  SpO2: 100% 97% 94%  Weight: 90.7 kg (200 lb)    Height: 5\' 8"  (1.727 m)        Constitutional: NAD, calm, comfortable Vitals:   07/04/17 1911 07/05/17 0055  07/05/17 0439  BP: 121/90 121/86 119/81  Pulse: 92 81 91  Resp: 18 18 16   Temp: 98.5 F (36.9 C)  98.3 F (36.8 C)  TempSrc: Oral  Oral  SpO2: 100% 97% 94%  Weight: 90.7 kg (200 lb)    Height: 5\' 8"  (1.727 m)     Eyes: PERRL, lids and conjunctivae normal ENMT: Mucous membranes are moist. Posterior pharynx clear of any exudate or lesions.Normal dentition.  Neck: normal, supple, no masses, no thyromegaly Respiratory: Diminished breath sounds at the bases especially on the left side. Normal respiratory effort. No accessory muscle use.  Cardiovascular: Regular rate and rhythm, no murmurs / rubs / gallops. No extremity edema. 2+ pedal pulses. No carotid bruits.  Abdomen: no tenderness, no masses palpated. No hepatosplenomegaly. Bowel sounds positive.  Musculoskeletal: no clubbing / cyanosis. No joint deformity upper and lower extremities. Good ROM, no contractures. Normal muscle tone.  Skin: no rashes, lesions, ulcers. No induration Neurologic: CN 2-12 grossly intact. Sensation intact, DTR normal. Strength 5/5 in all 4.  Psychiatric: Normal judgment and insight. Alert and oriented x 3. Normal mood.     Labs on Admission: I have personally reviewed following labs and imaging studies  CBC: Recent Labs  Lab 07/04/17 1929  WBC 4.3  NEUTROABS 2.3  HGB 17.2*  HCT 51.0  MCV 89.9  PLT 130*   Basic Metabolic Panel: Recent Labs  Lab 07/04/17 1929  NA 133*  K 3.2*  CL 92*  CO2 28  GLUCOSE 107*  BUN 12  CREATININE 0.71  CALCIUM 8.6*   GFR: Estimated Creatinine Clearance: 123.5 mL/min (by C-G formula based on SCr of 0.71 mg/dL). Liver Function Tests: Recent Labs  Lab 07/04/17 1929  AST 101*  ALT 71*  ALKPHOS 53  BILITOT 1.4*  PROT 8.0  ALBUMIN 3.4*   No results for input(s): LIPASE, AMYLASE in the last 168 hours. No results for input(s): AMMONIA in the last 168 hours. Coagulation Profile: No results for input(s): INR, PROTIME in the last 168 hours. Cardiac  Enzymes: No results for input(s): CKTOTAL, CKMB, CKMBINDEX, TROPONINI in the last 168 hours. BNP (last 3 results) No results for input(s): PROBNP in the last 8760 hours. HbA1C: No results for input(s): HGBA1C in the last 72 hours. CBG: No results for input(s): GLUCAP in the last 168 hours. Lipid Profile: No results for input(s): CHOL, HDL, LDLCALC, TRIG, CHOLHDL, LDLDIRECT in the last 72 hours. Thyroid Function Tests: No results for input(s): TSH, T4TOTAL, FREET4, T3FREE, THYROIDAB in the last 72 hours. Anemia Panel: No results for input(s): VITAMINB12, FOLATE, FERRITIN, TIBC, IRON, RETICCTPCT in the last 72 hours. Urine analysis:    Component Value Date/Time   COLORURINE  YELLOW 12/16/2015 0400   APPEARANCEUR CLEAR 12/16/2015 0400   LABSPEC 1.012 12/16/2015 0400   PHURINE 7.5 12/16/2015 0400   GLUCOSEU NEGATIVE 12/16/2015 0400   HGBUR NEGATIVE 12/16/2015 0400   BILIRUBINUR NEGATIVE 12/16/2015 0400   KETONESUR NEGATIVE 12/16/2015 0400   PROTEINUR NEGATIVE 12/16/2015 0400   UROBILINOGEN 1.0 02/01/2009 1447   NITRITE NEGATIVE 12/16/2015 0400   LEUKOCYTESUR NEGATIVE 12/16/2015 0400   Sepsis Labs: !!!!!!!!!!!!!!!!!!!!!!!!!!!!!!!!!!!!!!!!!!!! @LABRCNTIP (procalcitonin:4,lacticidven:4) )No results found for this or any previous visit (from the past 240 hour(s)).   Radiological Exams on Admission: Dg Chest 2 View  Result Date: 07/04/2017 CLINICAL DATA:  Upper respiratory infection.  Cough. EXAM: CHEST  2 VIEW COMPARISON:  June 30, 2017 FINDINGS: Worsening patchy opacity in the left base. The heart, hila, and mediastinum are normal. No pneumothorax. The opacity projects over the heart on the lateral view suggesting lingular involvement, not seen on the comparison. IMPRESSION: Worsening infiltrate in the left base and lingula. The findings are most consistent with worsening pneumonia. Electronically Signed   By: Gerome Sam III M.D   On: 07/04/2017 19:51    EKG: Independently  reviewed.   Assessment/Plan Active Problems:   CAP (community acquired pneumonia)   PNA (pneumonia)    Community-acquired pneumonia - Left lower lobe.  Dyspnea on exertion -Admit the patient for further care as he is failed outpatient treatment therapy with azithromycin for 5 days -Bcx sent.  We will start the patient on IV Levaquin - Nebulizer treatments, Mucinex, supplemental oxygen as needed -Provide supportive care, gentle hydration  Hypokalemia - Give 40 mEq oral potassium, recheck tomorrow  Chronic pain -Sees internal medicine clinic, currently he is on Zublov.  Will use equivalent dose of Suboxone here -Continue gabapentin 100 mg  History of P. atrial fibrillation -Continue propranolol 10 mg 3 times daily  Pulmonary embolism - History of saddle pulmonary embolism, continue Eliquis     DVT prophylaxis: On Eliquis  Code Status: Full  Family Communication: None at bedside  Disposition Plan:  TBD Consults called: None Admission status: Inpatient    Julies Carmickle Joline Maxcy MD [facility] Pager [phone]  If 7PM-7AM, please contact night-coverage [email or web address] Password [facility]  07/05/2017, 7:39 AM

## 2017-07-05 NOTE — ED Notes (Addendum)
Pt ate meal tray from cafeteria.

## 2017-07-06 DIAGNOSIS — J181 Lobar pneumonia, unspecified organism: Secondary | ICD-10-CM | POA: Diagnosis not present

## 2017-07-06 LAB — COMPREHENSIVE METABOLIC PANEL
ALT: 90 U/L — AB (ref 17–63)
AST: 132 U/L — ABNORMAL HIGH (ref 15–41)
Albumin: 2.9 g/dL — ABNORMAL LOW (ref 3.5–5.0)
Alkaline Phosphatase: 44 U/L (ref 38–126)
Anion gap: 7 (ref 5–15)
BILIRUBIN TOTAL: 1.5 mg/dL — AB (ref 0.3–1.2)
BUN: 7 mg/dL (ref 6–20)
CO2: 29 mmol/L (ref 22–32)
CREATININE: 0.74 mg/dL (ref 0.61–1.24)
Calcium: 8.9 mg/dL (ref 8.9–10.3)
Chloride: 99 mmol/L — ABNORMAL LOW (ref 101–111)
GFR calc Af Amer: >60 mL/min (ref >60)
Glucose, Bld: 112 mg/dL — ABNORMAL HIGH (ref 65–99)
Potassium: 3.4 mmol/L — ABNORMAL LOW (ref 3.5–5.1)
Sodium: 135 mmol/L (ref 135–145)
TOTAL PROTEIN: 6.5 g/dL (ref 6.5–8.1)

## 2017-07-06 LAB — CBC
HCT: 44.1 % (ref 39.0–52.0)
Hemoglobin: 14.5 g/dL (ref 13.0–17.0)
MCH: 29.6 pg (ref 26.0–34.0)
MCHC: 32.9 g/dL (ref 30.0–36.0)
MCV: 90 fL (ref 78.0–100.0)
PLATELETS: 164 10*3/uL (ref 150–400)
RBC: 4.9 MIL/uL (ref 4.22–5.81)
RDW: 13.2 % (ref 11.5–15.5)
WBC: 3.6 10*3/uL — AB (ref 4.0–10.5)

## 2017-07-06 LAB — INFLUENZA PANEL BY PCR (TYPE A & B)
INFLBPCR: NEGATIVE
Influenza A By PCR: NEGATIVE

## 2017-07-06 MED ORDER — LEVOFLOXACIN 750 MG PO TABS: 750.0000 mg | ORAL_TABLET | Freq: Every day | ORAL | 0 refills | Status: DC

## 2017-07-06 MED ORDER — MENTHOL 3 MG MT LOZG: 1.0000 | LOZENGE | OROMUCOSAL | Status: DC | PRN

## 2017-07-06 MED ORDER — ALBUTEROL SULFATE HFA 108 (90 BASE) MCG/ACT IN AERS: 2.0000 | INHALATION_SPRAY | Freq: Four times a day (QID) | RESPIRATORY_TRACT | 2 refills | Status: DC | PRN

## 2017-07-06 MED ORDER — BENZONATATE 100 MG PO CAPS: 100.0000 mg | ORAL_CAPSULE | Freq: Three times a day (TID) | ORAL | 0 refills | Status: DC | PRN

## 2017-07-06 MED ORDER — DM-GUAIFENESIN ER 30-600 MG PO TB12: 1.0000 | ORAL_TABLET | Freq: Two times a day (BID) | ORAL | 0 refills | Status: DC

## 2017-07-06 MED ORDER — MENTHOL 3 MG MT LOZG: 1.0000 | LOZENGE | OROMUCOSAL | 12 refills | Status: DC | PRN

## 2017-07-06 MED ORDER — IPRATROPIUM-ALBUTEROL 0.5-2.5 (3) MG/3ML IN SOLN
3.0000 mL | Freq: Two times a day (BID) | RESPIRATORY_TRACT | Status: DC
  Administered 2017-07-06: 3 mL via RESPIRATORY_TRACT
  Filled 2017-07-06: qty 3

## 2017-07-06 NOTE — Care Management Obs Status (Signed)
MEDICARE OBSERVATION STATUS NOTIFICATION   Patient Details  Name: Jared Bass MRN: [id number] Date of Birth: Oct 24, 1970   Medicare Observation Status Notification Given:  Yes    Cherrie Distance, RN 07/06/2017, 2:27 PM

## 2017-07-06 NOTE — Care Management CC44 (Signed)
Condition Code 44 Documentation Completed  Patient Details  Name: Jared Bass MRN: [id number] Date of Birth: 07-20-1970   Condition Code 44 given:  Yes Patient signature on Condition Code 44 notice:  Yes Documentation of 2 MD's agreement:  Yes Code 44 added to claim:  Yes    Cherrie Distance, RN 07/06/2017, 2:27 PM

## 2017-07-06 NOTE — Discharge Instructions (Addendum)
Information on my medicine - ELIQUIS (apixaban)  Why was Eliquis prescribed for you? Eliquis was prescribed for you to reduce the risk of a blood clot forming that can cause a stroke  What do You need to know about Eliquis ? Take your Eliquis TWICE DAILY - one tablet in the morning and one tablet in the evening with or without food. If you have difficulty swallowing the tablet whole please discuss with your pharmacist how to take the medication safely.  Take Eliquis exactly as prescribed by your doctor and DO NOT stop taking Eliquis without talking to the doctor who prescribed the medication.  Stopping may increase your risk of developing a stroke.  Refill your prescription before you run out.  After discharge, you should have regular check-up appointments with your healthcare provider that is prescribing your Eliquis.  In the future your dose may need to be changed if your kidney function or weight changes by a significant amount or as you get older.  What do you do if you miss a dose? If you miss a dose, take it as soon as you remember on the same day and resume taking twice daily.  Do not take more than one dose of ELIQUIS at the same time to make up a missed dose.  Important Safety Information A possible side effect of Eliquis is bleeding. You should call your healthcare provider right away if you experience any of the following: ? Bleeding from an injury or your nose that does not stop. ? Unusual colored urine (red or dark brown) or unusual colored stools (red or black). ? Unusual bruising for unknown reasons. ? A serious fall or if you hit your head (even if there is no bleeding).  Some medicines may interact with Eliquis and might increase your risk of bleeding or clotting while on Eliquis. To help avoid this, consult your healthcare provider or pharmacist prior to using any new prescription or non-prescription medications, including herbals, vitamins, non-steroidal  anti-inflammatory drugs (NSAIDs) and supplements.  This website has more information on Eliquis (apixaban): http://www.eliquis.com/eliquis/home   Community-Acquired Pneumonia, Adult Pneumonia is an infection of the lungs. There are different types of pneumonia. One type can develop while a person is in a hospital. A different type, called community-acquired pneumonia, develops in people who are not, or have not recently been, in the hospital or other health care facility. What are the causes? Pneumonia may be caused by bacteria, viruses, or funguses. Community-acquired pneumonia is often caused by Streptococcus pneumonia bacteria. These bacteria are often passed from one person to another by breathing in droplets from the cough or sneeze of an infected person. What increases the risk? The condition is more likely to develop in:  People who havechronic diseases, such as chronic obstructive pulmonary disease (COPD), asthma, congestive heart failure, cystic fibrosis, diabetes, or kidney disease.  People who haveearly-stage or late-stage HIV.  People who havesickle cell disease.  People who havehad their spleen removed (splenectomy).  People who havepoor Administrator.  People who havemedical conditions that increase the risk of breathing in (aspirating) secretions their own mouth and nose.  People who havea weakened immune system (immunocompromised).  People who smoke.  People whotravel to areas where pneumonia-causing germs commonly exist.  People whoare around animal habitats or animals that have pneumonia-causing germs, including birds, bats, rabbits, cats, and farm animals.  What are the signs or symptoms? Symptoms of this condition include:  Adry cough.  A wet (productive) cough.  Fever.  Sweating.  Chest pain, especially when breathing deeply or coughing.  Rapid breathing or difficulty breathing.  Shortness of breath.  Shaking  chills.  Fatigue.  Muscle aches.  How is this diagnosed? Your health care provider will take a medical history and perform a physical exam. You may also have other tests, including:  Imaging studies of your chest, including X-rays.  Tests to check your blood oxygen level and other blood gases.  Other tests on blood, mucus (sputum), fluid around your lungs (pleural fluid), and urine.  If your pneumonia is severe, other tests may be done to identify the specific cause of your illness. How is this treated? The type of treatment that you receive depends on many factors, such as the cause of your pneumonia, the medicines you take, and other medical conditions that you have. For most adults, treatment and recovery from pneumonia may occur at home. In some cases, treatment must happen in a hospital. Treatment may include:  Antibiotic medicines, if the pneumonia was caused by bacteria.  Antiviral medicines, if the pneumonia was caused by a virus.  Medicines that are given by mouth or through an IV tube.  Oxygen.  Respiratory therapy.  Although rare, treating severe pneumonia may include:  Mechanical ventilation. This is done if you are not breathing well on your own and you cannot maintain a safe blood oxygen level.  Thoracentesis. This procedureremoves fluid around one lung or both lungs to help you breathe better.  Follow these instructions at home:  Take over-the-counter and prescription medicines only as told by your health care provider. ? Only takecough medicine if you are losing sleep. Understand that cough medicine can prevent your bodys natural ability to remove mucus from your lungs. ? If you were prescribed an antibiotic medicine, take it as told by your health care provider. Do not stop taking the antibiotic even if you start to feel better.  Sleep in a semi-upright position at night. Try sleeping in a reclining chair, or place a few pillows under your head.  Do not  use tobacco products, including cigarettes, chewing tobacco, and e-cigarettes. If you need help quitting, ask your health care provider.  Drink enough water to keep your urine clear or pale yellow. This will help to thin out mucus secretions in your lungs. How is this prevented? There are ways that you can decrease your risk of developing community-acquired pneumonia. Consider getting a pneumococcal vaccine if:  You are older than 49 years of age.  You are older than 49 years of age and are undergoing cancer treatment, have chronic lung disease, or have other medical conditions that affect your immune system. Ask your health care provider if this applies to you.  There are different types and schedules of pneumococcal vaccines. Ask your health care provider which vaccination option is best for you. You may also prevent community-acquired pneumonia if you take these actions:  Get an influenza vaccine every year. Ask your health care provider which type of influenza vaccine is best for you.  Go to the dentist on a regular basis.  Wash your hands often. Use hand sanitizer if soap and water are not available.  Contact a health care provider if:  You have a fever.  You are losing sleep because you cannot control your cough with cough medicine. Get help right away if:  You have worsening shortness of breath.  You have increased chest pain.  Your sickness becomes worse, especially if you are an older adult or have a  weakened immune system.  You cough up blood. This information is not intended to replace advice given to you by your health care provider. Make sure you discuss any questions you have with your health care provider. Document Released: 06/18/2005 Document Revised: 10/27/2015 Document Reviewed: 10/13/2014 Elsevier Interactive Patient Education  [employer].

## 2017-07-07 LAB — BLOOD CULTURE ID PANEL (REFLEXED)
Acinetobacter baumannii: NOT DETECTED
CANDIDA PARAPSILOSIS: NOT DETECTED
Candida albicans: NOT DETECTED
Candida glabrata: NOT DETECTED
Candida krusei: NOT DETECTED
Candida tropicalis: NOT DETECTED
Enterobacter cloacae complex: NOT DETECTED
Enterobacteriaceae species: NOT DETECTED
Enterococcus species: NOT DETECTED
Escherichia coli: NOT DETECTED
HAEMOPHILUS INFLUENZAE: NOT DETECTED
KLEBSIELLA OXYTOCA: NOT DETECTED
Klebsiella pneumoniae: NOT DETECTED
Listeria monocytogenes: NOT DETECTED
Neisseria meningitidis: NOT DETECTED
PROTEUS SPECIES: NOT DETECTED
Pseudomonas aeruginosa: NOT DETECTED
SERRATIA MARCESCENS: NOT DETECTED
STAPHYLOCOCCUS AUREUS BCID: NOT DETECTED
STAPHYLOCOCCUS SPECIES: NOT DETECTED
STREPTOCOCCUS SPECIES: NOT DETECTED
Streptococcus agalactiae: NOT DETECTED
Streptococcus pneumoniae: NOT DETECTED
Streptococcus pyogenes: NOT DETECTED

## 2017-07-08 ENCOUNTER — Encounter: Payer: Medicare Other | Admitting: Student in an Organized Health Care Education/Training Program

## 2017-07-08 LAB — CULTURE, BLOOD (ROUTINE X 2): Special Requests: ADEQUATE

## 2017-07-10 LAB — CULTURE, BLOOD (ROUTINE X 2)
Culture: NO GROWTH
Special Requests: ADEQUATE

## 2017-07-10 NOTE — Discharge Summary (Signed)
[facility] Discharge Summary   Patient: Jared Bass YSA:[id number]   PCP: Sherrie Mustache, MD DOB: 1970-07-15   Date of admission: 07/05/2017   Date of discharge: 07/06/2017     Discharge Diagnoses:  Active Problems:   CAP (community acquired pneumonia)   PNA (pneumonia)   Admitted From: home Disposition:  home  Recommendations for Outpatient Follow-up:  1. Please follow up with PCP in 1 week   Follow-up Information    Sherrie Mustache, MD. Schedule an appointment as soon as possible for a visit in 1 week(s).   Specialty:  Internal Medicine Why:  with repeat LFT and follow up on pnuemonia.  Contact information: [address] [place] [address] [phone]          Diet recommendation: regular diet  Activity: The patient is advised to gradually reintroduce usual activities.  Discharge Condition: good  Code Status: full code  History of present illness: As per the H and P dictated on admission, "Jared Bass is a 47 y.o. male with medical history significant of chronic pain, pulmonary embolism, paroxysmal atrial fibrillation came to the ER for evaluation of shortness of breath and fever.  Patient states for the past 3 weeks he has not been feeling well and during this course he has developed progressive shortness of breath, productive cough and fevers.  He was seen here in the ER on June 30, 2017 and was diagnosed with pneumonia.  He was discharged on azithromycin.  Despite of taking azithromycin for 5 days he continued to feel febrile at home, progressive shortness of breath and productive cough-bringing up thick whitish sputum.  Thinks he may have had sick contact at home, denies taking flu shot this year. Denies any tobacco use, drinks alcohol only socially, denies any illicit drug use  In the ER patient looked in slight distress due to his above-mentioned symptoms.  His labs showed potassium of 3.2, rest of the labs were unremarkable.  Chest x-ray showed  worsening of the left side pneumonia.  He was admitted due to failure of outpatient treatment."  Hospital Course:  Summary of his active problems in the hospital is as following. Community-acquired pneumonia - Left lower lobe.  Dyspnea on exertion -Admit the patient for further care as he is failed outpatient treatment therapy with azithromycin for 5 days -Bcx sent.  started the patient on IV Levaquin - pt does shows stability on hemodynamics and improvement in in symptoms, O2 remianed stable on ambulation as well.  -Provide supportive care  Hypokalemia - Give 40 mEq oral potassium, resolved   Chronic pain -Sees internal medicine clinic, currently he is on Zublov -Continue gabapentin 100 mg  History of P. atrial fibrillation -Continue propranolol 10 mg 3 times daily  Pulmonary embolism - History of saddle embolus Continue home regimen   All other chronic medical condition were stable during the hospitalization.  Patient was ambulatory without any assistance. On the day of the discharge the patient's vitals were stable, and no other acute medical condition were reported by patient. the patient was felt safe to be discharge at home with family.  Procedures and Results:  none   Consultations:  none  DISCHARGE MEDICATION: Allergies as of 07/06/2017      Reactions   Tramadol Anaphylaxis, Rash   Other Other (See Comments)   Rembrant Whitening strips "fat lips"      Medication List    TAKE these medications   albuterol 108 (90 Base) MCG/ACT inhaler Commonly known as:  PROVENTIL HFA;VENTOLIN HFA Inhale 2 puffs into the lungs every 6 (six) hours as needed for wheezing or shortness of breath.   apixaban 5 MG Tabs tablet Commonly known as:  ELIQUIS Take 1 tablet (5 mg total) by mouth 2 (two) times daily.   benzonatate 100 MG capsule Commonly known as:  TESSALON PERLES Take 1 capsule (100 mg total) by mouth 3 (three) times daily as needed for cough.   Buprenorphine  HCl-Naloxone HCl 5.7-1.4 MG Subl Commonly known as:  ZUBSOLV Place 1 tablet 3 (three) times daily under the tongue.   dextromethorphan-guaiFENesin 30-600 MG 12hr tablet Commonly known as:  MUCINEX DM Take 1 tablet by mouth 2 (two) times daily.   gabapentin 100 MG capsule Commonly known as:  NEURONTIN Take 2 capsules (200 mg total) by mouth 2 (two) times daily.   levofloxacin 750 MG tablet Commonly known as:  LEVAQUIN Take 1 tablet (750 mg total) by mouth daily for 7 days.   menthol-cetylpyridinium 3 MG lozenge Commonly known as:  CEPACOL Take 1 lozenge (3 mg total) by mouth as needed for sore throat.   polyethylene glycol packet Commonly known as:  MIRALAX / GLYCOLAX TAKE 17 G BY MOUTH DAILY. What changed:    when to take this  reasons to take this   propranolol 10 MG tablet Commonly known as:  INDERAL Take 10 mg by mouth 3 (three) times daily.      Allergies  Allergen Reactions  . Tramadol Anaphylaxis and Rash  . Other Other (See Comments)    Rembrant Whitening strips  "fat lips"   Discharge Instructions    Diet - low sodium heart healthy   Complete by:  As directed    Discharge instructions   Complete by:  As directed    It is important that you read following instructions as well as go over your medication list with RN to help you understand your care after this hospitalization.  Discharge Instructions: Please follow-up with PCP in one week  Please request your primary care physician to go over all Hospital Tests and Procedure/Radiological results at the follow up,  Please get all Hospital records sent to your PCP by signing hospital release before you go home.   Do not take more than prescribed Pain, Sleep and Anxiety Medications. You were cared for by a hospitalist during your hospital stay. If you have any questions about your discharge medications or the care you received while you were in the hospital after you are discharged, you can call the unit and  ask to speak with the hospitalist on call if the hospitalist that took care of you is not available.  Once you are discharged, your primary care physician will handle any further medical issues. Please note that NO REFILLS for any discharge medications will be authorized once you are discharged, as it is imperative that you return to your primary care physician (or establish a relationship with a primary care physician if you do not have one) for your aftercare needs so that they can reassess your need for medications and monitor your lab values. You Must read complete instructions/literature along with all the possible adverse reactions/side effects for all the Medicines you take and that have been prescribed to you. Take any new Medicines after you have completely understood and accept all the possible adverse reactions/side effects. Wear Seat belts while driving. If you have smoked or chewed Tobacco in the last 2 yrs please stop smoking and/or stop any Recreational drug use.  Increase activity slowly   Complete by:  As directed      Discharge Exam: Filed Weights   07/04/17 1911 07/06/17 0415 07/06/17 0427  Weight: 90.7 kg (200 lb) 83.4 kg (183 lb 13.8 oz) 84 kg (185 lb 3 oz)   Vitals:   07/06/17 0415 07/06/17 0930  BP: 130/90   Pulse: 64 96  Resp: 16   Temp: 98.3 F (36.8 C)   SpO2: 97% 97%   General: Appear in no distress, no Rash; Oral Mucosa moist. Cardiovascular: S1 and S2 Present, no Murmur, no JVD Respiratory: Bilateral Air entry present and Clear to Auscultation, no Crackles, no wheezes Abdomen: Bowel Sound present, Soft and no tenderness Extremities: no Pedal edema, no calf tenderness Neurology: Grossly no focal neuro deficit.  The results of significant diagnostics from this hospitalization (including imaging, microbiology, ancillary and laboratory) are listed below for reference.    Significant Diagnostic Studies: Dg Chest 2 View  Result Date: 07/04/2017 CLINICAL  DATA:  Upper respiratory infection.  Cough. EXAM: CHEST  2 VIEW COMPARISON:  June 30, 2017 FINDINGS: Worsening patchy opacity in the left base. The heart, hila, and mediastinum are normal. No pneumothorax. The opacity projects over the heart on the lateral view suggesting lingular involvement, not seen on the comparison. IMPRESSION: Worsening infiltrate in the left base and lingula. The findings are most consistent with worsening pneumonia. Electronically Signed   By: Gerome Sam III M.D   On: 07/04/2017 19:51   Dg Chest 2 View  Result Date: 06/30/2017 CLINICAL DATA:  Flu, not feeling well with cough. EXAM: CHEST  2 VIEW COMPARISON:  July 26, 2016 FINDINGS: The heart size and mediastinal contours are within normal limits. Mild patchy opacity of the lateral left lung base is noted. There is no pulmonary edema or pleural effusion. The visualized skeletal structures are stable. IMPRESSION: Mild patchy opacity of the lateral left lung base, developing pneumonia is not excluded. Electronically Signed   By: Sherian Rein M.D.   On: 06/30/2017 20:15    Microbiology: Recent Results (from the past 240 hour(s))  Culture, blood (Routine X 2) w Reflex to ID Panel     Status: Abnormal   Collection Time: 07/05/17  5:24 AM  Result Value Ref Range Status   Specimen Description BLOOD RIGHT ANTECUBITAL  Final   Special Requests   Final    BOTTLES DRAWN AEROBIC AND ANAEROBIC Blood Culture adequate volume   Culture  Setup Time   Final    AEROBIC BOTTLE ONLY GRAM POSITIVE RODS CORRECTED RESULTS PREVIOUSLY REPORTED AS: GRAM POSITIVE COCCI IN CLUSTERS CRITICAL RESULT CALLED TO, READ BACK BY AND VERIFIED WITH: P Amilcar Reever,MD AT 1034 07/07/17  BY L BENFIELD    Culture DIPHTHEROIDS(CORYNEBACTERIUM SPECIES) (A)  Final   Report Status 07/08/2017 FINAL  Final  Blood Culture ID Panel (Reflexed)     Status: None   Collection Time: 07/05/17  5:24 AM  Result Value Ref Range Status   Enterococcus species NOT DETECTED  NOT DETECTED Final   Listeria monocytogenes NOT DETECTED NOT DETECTED Final   Staphylococcus species NOT DETECTED NOT DETECTED Final   Staphylococcus aureus NOT DETECTED NOT DETECTED Final   Streptococcus species NOT DETECTED NOT DETECTED Final   Streptococcus agalactiae NOT DETECTED NOT DETECTED Final   Streptococcus pneumoniae NOT DETECTED NOT DETECTED Final   Streptococcus pyogenes NOT DETECTED NOT DETECTED Final   Acinetobacter baumannii NOT DETECTED NOT DETECTED Final   Enterobacteriaceae species NOT DETECTED NOT DETECTED Final   Enterobacter  cloacae complex NOT DETECTED NOT DETECTED Final   Escherichia coli NOT DETECTED NOT DETECTED Final   Klebsiella oxytoca NOT DETECTED NOT DETECTED Final   Klebsiella pneumoniae NOT DETECTED NOT DETECTED Final   Proteus species NOT DETECTED NOT DETECTED Final   Serratia marcescens NOT DETECTED NOT DETECTED Final   Haemophilus influenzae NOT DETECTED NOT DETECTED Final   Neisseria meningitidis NOT DETECTED NOT DETECTED Final   Pseudomonas aeruginosa NOT DETECTED NOT DETECTED Final   Candida albicans NOT DETECTED NOT DETECTED Final   Candida glabrata NOT DETECTED NOT DETECTED Final   Candida krusei NOT DETECTED NOT DETECTED Final   Candida parapsilosis NOT DETECTED NOT DETECTED Final   Candida tropicalis NOT DETECTED NOT DETECTED Final  Culture, blood (Routine X 2) w Reflex to ID Panel     Status: None (Preliminary result)   Collection Time: 07/05/17  5:40 AM  Result Value Ref Range Status   Specimen Description BLOOD LEFT ARM  Final   Special Requests IN PEDIATRIC BOTTLE Blood Culture adequate volume  Final   Culture NO GROWTH 4 DAYS  Final   Report Status PENDING  Incomplete     Labs: CBC: Recent Labs  Lab 07/04/17 1929 07/06/17 0458  WBC 4.3 3.6*  NEUTROABS 2.3  --   HGB 17.2* 14.5  HCT 51.0 44.1  MCV 89.9 90.0  PLT 130* 164   Basic Metabolic Panel: Recent Labs  Lab 07/04/17 1929 07/06/17 0458  NA 133* 135  K 3.2* 3.4*    CL 92* 99*  CO2 28 29  GLUCOSE 107* 112*  BUN 12 7  CREATININE 0.71 0.74  CALCIUM 8.6* 8.9   Liver Function Tests: Recent Labs  Lab 07/04/17 1929 07/06/17 0458  AST 101* 132*  ALT 71* 90*  ALKPHOS 53 44  BILITOT 1.4* 1.5*  PROT 8.0 6.5  ALBUMIN 3.4* 2.9*   Time spent: 35 minutes  Signed:  Lynden Oxford  [facility] 07/06/2017 , 8:46 AM

## 2017-07-11 ENCOUNTER — Ambulatory Visit (INDEPENDENT_AMBULATORY_CARE_PROVIDER_SITE_OTHER): Payer: Medicare Other | Admitting: Student in an Organized Health Care Education/Training Program

## 2017-07-11 ENCOUNTER — Encounter: Payer: Self-pay | Admitting: Student in an Organized Health Care Education/Training Program

## 2017-07-11 VITALS — BP 121/89 | HR 77 | Temp 98.1°F | Wt 188.4 lb

## 2017-07-11 DIAGNOSIS — F1120 Opioid dependence, uncomplicated: Secondary | ICD-10-CM

## 2017-07-11 DIAGNOSIS — F1020 Alcohol dependence, uncomplicated: Secondary | ICD-10-CM | POA: Insufficient documentation

## 2017-07-11 DIAGNOSIS — Z87891 Personal history of nicotine dependence: Secondary | ICD-10-CM | POA: Diagnosis not present

## 2017-07-11 DIAGNOSIS — J189 Pneumonia, unspecified organism: Secondary | ICD-10-CM

## 2017-07-11 DIAGNOSIS — Z8701 Personal history of pneumonia (recurrent): Secondary | ICD-10-CM

## 2017-07-11 MED ORDER — BUPRENORPHINE HCL-NALOXONE HCL 5.7-1.4 MG SL SUBL: 1.0000 | SUBLINGUAL_TABLET | Freq: Three times a day (TID) | SUBLINGUAL | 0 refills | Status: DC

## 2017-07-11 NOTE — Assessment & Plan Note (Signed)
Recent admission to the hospital for a multifocal pneumonia, influenza negative.  Patient with a history of tobacco use disorder but no other known structural lung disease.  Slow to improve, treated with levofloxacin.  Feels much better right now but still with some persistent cough.  Plan is to continue with supportive care.  Anticipate that he will get better in the next few weeks.

## 2017-07-11 NOTE — Assessment & Plan Note (Signed)
Mild transaminitis and bilirubin elevated to 1.5 during recent admission.  Likely related to his alcohol use disorder.  We talked about this blood work and the importance of decreasing alcohol use.  He is can think about it.  Pre-contemplative on quitting right now.

## 2017-07-11 NOTE — Progress Notes (Signed)
   07/11/2017  [name] presents for follow up of opioid use disorder I have reviewed the prior induction visit, follow up visits, and telephone encounters relevant to opiate use disorder (OUD) treatment.   Current daily dose: Zubsolv 5.7 mg 3 times daily  Date of Induction: 01/22/17  Current follow up interval, in weeks: 4, but going back to 2 weeks today  The patient has been adherent with the buprenorphine for OUD contract.   Last UDS Result: Positive for buprenorphine and metabolites of alcohol  HPI: 47 year old man with a history of moderate opioid use disorder to illicitly obtained oxycodone and OxyContin came to the clinic today for follow-up of opioid use disorder.  He has been on treatment with Buprenorphine since July and was doing fairly well.  We were seeing him every 4 weeks, last saw him at the end of November.  Unfortunately did not follow-up with [place] in December, he says because he had an acute illness towards the end of the month which required hospitalization for 2 days.  He had missed his first appointment with [place] in January because he was still feeling ill.  He denies any relapses over this period of time, however he did use a cough syrup that had codeine in it.  He also reports using a lot of liquid NyQuil.  He says that there were stressful events over the holidays, his cousins were using illicit oxycodone around him, but he says that he did not use.  1 of his cousins recently diagnosed with a melanoma which is been stressful for him.  Still drinking alcohol most days.  Reports that his cough is slowly getting better.  No longer productive.  Returning back to his usual functional status but still feeling fatigued overall.  Had some weight loss over December.  Eating and drinking well.  Exam:   Vitals:   07/11/17 1442  BP: 121/89  Pulse: 77  Temp: 98.1 F (36.7 C)  TempSrc: Oral  SpO2: 100%  Weight: 188 lb 6.4 oz (85.5 kg)    Gen: Tired appearing man, no  distress Resp: Unlabored, mild coarse sounds at the bases, no wheezing Psych: Normal affect, good eye contact, will put together today.  Assessment/Plan:  See Problem Based Charting in the Encounters Tab   [name], MD  07/11/2017  3:11 PM

## 2017-07-11 NOTE — Assessment & Plan Note (Signed)
Patient with moderate opioid use disorder he has been on treatment for about 5 months with buprenorphine.  Fortunately he had a lapse in his medications, we last saw the patient in late November.  He missed his December appointment because of an acute illness which resulted in hospitalization for multifocal pneumonia.  He reports getting some dose of ibuprofen in the hospital but no prescription at discharge.  Missed his appointment with [place] 2 days ago because he was still feeling ill.  So he tells me he has been out of medication for about 2-3 days.  At this he denies any major relapses, did use some cough suppression that had codeine in it.  He wants to continue with buprenorphine, he did go through partial withdrawal when he came off the medication.  Plan is to restart Zubsolv 5.7 mg 3 times daily, patient understands reinduction instructions.  Urine tox screen sent today.  Will follow up on 1/22 for refill.

## 2017-07-11 NOTE — Patient Instructions (Signed)
1. Lets get back started on Zubsolv three times a day. Let me know if you have any problems. I sent it electronically to the [facility].   2. Your pneumonia is clearing up, but unfortunately it does take time to improve.

## 2017-07-18 ENCOUNTER — Other Ambulatory Visit: Payer: Self-pay | Admitting: Internal Medicine

## 2017-07-18 DIAGNOSIS — M5412 Radiculopathy, cervical region: Principal | ICD-10-CM

## 2017-07-18 DIAGNOSIS — G8929 Other chronic pain: Secondary | ICD-10-CM

## 2017-07-21 LAB — TOXASSURE SELECT,+ANTIDEPR,UR

## 2017-07-30 ENCOUNTER — Other Ambulatory Visit: Payer: Self-pay

## 2017-07-30 ENCOUNTER — Ambulatory Visit (INDEPENDENT_AMBULATORY_CARE_PROVIDER_SITE_OTHER): Payer: Medicare Other | Admitting: Internal Medicine

## 2017-07-30 DIAGNOSIS — Z8701 Personal history of pneumonia (recurrent): Secondary | ICD-10-CM

## 2017-07-30 DIAGNOSIS — F1120 Opioid dependence, uncomplicated: Secondary | ICD-10-CM | POA: Diagnosis present

## 2017-07-30 DIAGNOSIS — F1020 Alcohol dependence, uncomplicated: Secondary | ICD-10-CM

## 2017-07-30 DIAGNOSIS — J189 Pneumonia, unspecified organism: Secondary | ICD-10-CM

## 2017-07-30 MED ORDER — BUPRENORPHINE HCL-NALOXONE HCL 5.7-1.4 MG SL SUBL: 1.0000 | SUBLINGUAL_TABLET | Freq: Three times a day (TID) | SUBLINGUAL | 0 refills | Status: DC

## 2017-07-30 MED ORDER — POLYETHYLENE GLYCOL 3350 17 G PO PACK: 17.0000 g | PACK | Freq: Every day | ORAL | 2 refills | Status: AC

## 2017-07-30 NOTE — Patient Instructions (Addendum)
Thank you for visiting clinic today. I am glad that you are recovering well from your pneumonia. Symptoms you were describing about some hot flashes, mild nausea and cramping are most likely due to withdrawal.  We are giving you a new prescription of Zubsolv. I am glad that you are staying away from drugs despite having those withdrawal symptoms, please keep up the good work. Please follow-up in 2 weeks.

## 2017-07-30 NOTE — Assessment & Plan Note (Signed)
He continued to use alcohol, according to patient he mostly drinks alcohol when going out.  He is trying not to use it on a daily basis.  He was counseled on his alcohol use.

## 2017-07-30 NOTE — Progress Notes (Signed)
   07/30/2017  Jared Bass presents for follow up of opioid use disorder I have reviewed the prior induction visit, follow up visits, and telephone encounters relevant to opiate use disorder (OUD) treatment.   Current daily dose: Zubsolv 5.7 mg 3 times daily  Date of Induction: 01/22/2017  Current follow up interval, in weeks: 2  The patient has been adherent with the buprenorphine for OUD contract.   Last UDS Result: Contain buprenorphine, codeine, morphine and its metabolite.  HPI: 47 year old gentleman with history of opioid use disorder, he used to illicitly get oxycodone and OxyContin.  He was doing very well on buprenorphine since July 2018.  Over December 2018 he became sick with pneumonia requiring hospitalization and missed couple of appointments because of that sickness.  He was unable to keep up his last week appointment due to work, he was running out of his prescription and using just one film daily which he was breaking up in pieces for the past more than a week.  Last dose was 2 days ago.   He was experiencing intermittent hot flashes, mild nausea and mild abdominal cramping over the last 1 week, which are most likely due to withdrawal symptoms.  He was also having a bad craving over the last 1 week. Despite of all the symptoms he was able to stay away from any illicit drugs. He is still having mild cough with clear mucus, denies any fever or chills.  He continued to drink alcohol to 3 glasses of wine whenever he goes out.  Exam:   Vitals:   07/30/17 0907  BP: 123/86  Pulse: 71  Resp: 20  Temp: 98.1 F (36.7 C)  TempSrc: Oral  SpO2: 99%  Weight: 192 lb (87.1 kg)  Height: 5\' 8"  (1.727 m)   Gen. well developed, well-nourished gentleman, in no acute distress. Lungs.  Clear bilaterally. CVS.  Regular rate and rhythm. Abdomen.  Soft, nontender, bowel sounds positive. Extremities.  No edema, no cyanosis, pulses intact and symmetrical.  Assessment/Plan:  See Problem  Based Charting in the Encounters Tab     Arnetha Courser, MD  07/30/2017  9:34 AM

## 2017-07-30 NOTE — Addendum Note (Signed)
Addended by: Arnetha Courser on: 07/30/2017 11:17 AM   Modules accepted: Orders

## 2017-07-30 NOTE — Assessment & Plan Note (Signed)
He has recovered from pneumonia, continued to have mild cough. According to him Ventolin inhaler was really helping with his cough and he was asking for a refill.  He already had 2 refills in the system which he was unaware.  He was instructed to get the refill and can use Ventolin inhaler as directed.

## 2017-07-30 NOTE — Assessment & Plan Note (Signed)
He was having bad craving and withdrawal symptoms as he ran out of his medicine.  Last smaller dose was 2 days ago. He was doing very well on existing dose past 39-month, except when his recent episode of pneumonia causes some missed appointments during the last month which causes him go back and withdrawal symptoms and some craving.  -Continue zubsolv 5.7 mg 3 times a day. -Follow-up in 2 weeks-once he is back on his track, will be able to resume follow-up every 4 weeks.

## 2017-08-05 NOTE — Progress Notes (Signed)
Internal Medicine Clinic Attending  I saw and evaluated the patient.  I personally confirmed the key portions of the history and exam documented by Dr. Nelson Chimes and I reviewed pertinent patient test results.  The assessment, diagnosis, and plan were formulated together and I agree with the documentation in the resident's note.  Jared Bass is a patient with moderate OUD who has been doing well on Zubsolv therapy TID.  He had a difficult winter and holidays, but with only relapse to using codeine in a cough syrup.  He has been out of Zubsolv as he missed multiple appointments.  He notes a desire to get back on track.  He will be giving 2 weeks supply of medication today and we will see him in 2 weeks.

## 2017-08-06 LAB — TOXASSURE SELECT,+ANTIDEPR,UR

## 2017-08-07 DIAGNOSIS — I2699 Other pulmonary embolism without acute cor pulmonale: Secondary | ICD-10-CM | POA: Diagnosis not present

## 2017-08-07 DIAGNOSIS — N289 Disorder of kidney and ureter, unspecified: Secondary | ICD-10-CM | POA: Diagnosis not present

## 2017-08-07 DIAGNOSIS — E559 Vitamin D deficiency, unspecified: Secondary | ICD-10-CM | POA: Diagnosis not present

## 2017-08-07 DIAGNOSIS — I10 Essential (primary) hypertension: Secondary | ICD-10-CM | POA: Diagnosis not present

## 2017-08-07 DIAGNOSIS — E785 Hyperlipidemia, unspecified: Secondary | ICD-10-CM | POA: Diagnosis not present

## 2017-08-07 DIAGNOSIS — M545 Low back pain: Secondary | ICD-10-CM | POA: Diagnosis not present

## 2017-08-13 ENCOUNTER — Ambulatory Visit (INDEPENDENT_AMBULATORY_CARE_PROVIDER_SITE_OTHER): Payer: Medicare Other | Admitting: Internal Medicine

## 2017-08-13 VITALS — BP 129/91 | HR 73 | Temp 99.1°F | Resp 20 | Wt 188.2 lb

## 2017-08-13 DIAGNOSIS — R634 Abnormal weight loss: Secondary | ICD-10-CM

## 2017-08-13 DIAGNOSIS — F1120 Opioid dependence, uncomplicated: Secondary | ICD-10-CM | POA: Diagnosis present

## 2017-08-13 DIAGNOSIS — F909 Attention-deficit hyperactivity disorder, unspecified type: Secondary | ICD-10-CM

## 2017-08-13 DIAGNOSIS — Z9889 Other specified postprocedural states: Secondary | ICD-10-CM

## 2017-08-13 DIAGNOSIS — Z6828 Body mass index (BMI) 28.0-28.9, adult: Secondary | ICD-10-CM

## 2017-08-13 DIAGNOSIS — H579 Unspecified disorder of eye and adnexa: Secondary | ICD-10-CM | POA: Diagnosis not present

## 2017-08-13 DIAGNOSIS — F411 Generalized anxiety disorder: Secondary | ICD-10-CM

## 2017-08-13 DIAGNOSIS — F1020 Alcohol dependence, uncomplicated: Secondary | ICD-10-CM

## 2017-08-13 DIAGNOSIS — Z8701 Personal history of pneumonia (recurrent): Secondary | ICD-10-CM | POA: Diagnosis not present

## 2017-08-13 MED ORDER — BUPRENORPHINE HCL-NALOXONE HCL 5.7-1.4 MG SL SUBL: 1.0000 | SUBLINGUAL_TABLET | Freq: Three times a day (TID) | SUBLINGUAL | 0 refills | Status: DC

## 2017-08-13 NOTE — Assessment & Plan Note (Signed)
He is stable concerning this issue.  I have a feeling he drinks daily, though he denies this.  He may benefit from AA in the future, but I think he is precontemplative about stopping his ETOH use.   Plan Continue counseling re: safety and abstinence.

## 2017-08-13 NOTE — Patient Instructions (Signed)
Jared Bass - -   You can come back to see me in 4 weeks.   For your sweating and weight loss, I will do some blood work today and let you know the results.    I would suggest seeing psychiatry to evaluate you for ADHD or severe anxiety.  Please follow up with the consult we placed today. Someone will call you with an appointment.    Thank you!

## 2017-08-13 NOTE — Assessment & Plan Note (Signed)
We discussed this for a long while today.  I think he has more symptoms consistent with ADHD.  He is interested in seeking medical therapy for this, as he thinks it will help him with his career goals.    Plan Psychiatry referral

## 2017-08-13 NOTE — Assessment & Plan Note (Signed)
He has been having now about 2 months of sweating and a mix of intentional and unintentional weight loss.  He looks thinner to me as well.  He does not look acutely ill.  I will do a preliminary work up with some blood work to make sure there are no obvious abnormalities.  Review of last blood work showed an acute elevated in LFTs and  Low WBC.   Plan TSH CBC CMET Follow up based on these labs.

## 2017-08-13 NOTE — Assessment & Plan Note (Signed)
He is doing well, last UDS was appropriate, with known ETOH on it as well.  UDS planned for today. [place] narcotic database appropriate.  Cravings are controlled.   Plan Continue Zubsolv 5.7mg  buprenorphine TID UDS today Follow up in 4 weeks.

## 2017-08-13 NOTE — Progress Notes (Signed)
   08/13/2017  Jared Bass presents for follow up of opioid use disorder I have reviewed the prior induction visit, follow up visits, and telephone encounters relevant to opiate use disorder (OUD) treatment.   Current daily dose: Zubsolv 5.7mg  TID  Date of Induction: 01/22/17  Current follow up interval, in weeks: 2 weeks.   The patient has been adherent with the buprenorphine for OUD contract.   Last UDS Result: appropriate, + for ETOH  HPI: Jared Bass is a person with moderate OUD.  He had a recent relapse over the holidays, but has been doing well in the last month.  He has been on MAT for about 7 months and is doing pretty well.  Today he reports that he is doing well with staying off of narcotics, but that he has been having sweats.  He reports the sweating started before he developed pneumonia, persisted through the pneumonia and is now continuing.  His cough, nausea, vomiting have resolved.  He feels he will break out in a sweat at any time, regardless of what he is doing.  He will occasionally have drenching night sweats.  He reports weight loss as well which was intentional prior to the pneumonia but is now unintentional.  He attributes all of this to lack of appetite and vomiting during the pneumonia.  He has no blood loss, dizziness, lightheadedness or other symptoms reported today.    Another issue he brought up today was his lifelong history of ADHD.  He was told he would grow out of it, but he feels this is not true.  He notes that he has racing thoughts, difficulty concentrating and sometimes has anger issues because he cannot concentrate.  He will find himself driving dangerously because of road rage and he has a history of losing his driver's license.  He wishes he could concentrate so that he could go back to school.  He was treated with Ritalin as a child.  He is interested in seeing a psychiatrist for these issues.  He doesn't seem to have a lot of anxiety at this time, though he is  anxious with his inability to concentrate.    For his OUD, he is doing well.  He has not had another relapse.  He feels this is well controlled.  He continues to drink ETOH > 3 X per week, but denies daily use.    He is following up with his PCP as scheduled.    ROS:  + for diaphoresis, lack of attention, racing thoughts.   Exam:   Vitals:   08/13/17 0855  BP: (!) 129/91  Pulse: 73  Resp: 20  Temp: 99.1 F (37.3 C)  TempSrc: Oral  SpO2: 99%  Weight: 188 lb 3.2 oz (85.4 kg)    Gen: Appears thinner than previous, alert, oriented, no acute distress, no diaphoresis Eyes: Chronic changes to right eye due to surgery CV: RR, NR, no murmur Pulm: CTAB, no wheezing Psych: Pleasant, alert and oriented  Assessment/Plan:  See Problem Based Charting in the Encounters Tab     Inez Catalina, MD  08/13/2017  9:22 AM

## 2017-08-14 LAB — CBC WITH DIFFERENTIAL/PLATELET
Basophils Absolute: 0 10*3/uL (ref 0.0–0.2)
Basos: 1 %
EOS (ABSOLUTE): 0.2 10*3/uL (ref 0.0–0.4)
Eos: 5 %
HEMOGLOBIN: 16.4 g/dL (ref 13.0–17.7)
Hematocrit: 48.4 % (ref 37.5–51.0)
IMMATURE GRANS (ABS): 0 10*3/uL (ref 0.0–0.1)
Immature Granulocytes: 0 %
LYMPHS ABS: 1.4 10*3/uL (ref 0.7–3.1)
LYMPHS: 38 %
MCH: 31.6 pg (ref 26.6–33.0)
MCHC: 33.9 g/dL (ref 31.5–35.7)
MCV: 93 fL (ref 79–97)
MONOCYTES: 15 %
Monocytes Absolute: 0.6 10*3/uL (ref 0.1–0.9)
NEUTROS ABS: 1.6 10*3/uL (ref 1.4–7.0)
Neutrophils: 41 %
Platelets: 222 10*3/uL (ref 150–379)
RBC: 5.19 x10E6/uL (ref 4.14–5.80)
RDW: 14.5 % (ref 12.3–15.4)
WBC: 3.8 10*3/uL (ref 3.4–10.8)

## 2017-08-14 LAB — CMP14 + ANION GAP
A/G RATIO: 1.3 (ref 1.2–2.2)
ALBUMIN: 4.3 g/dL (ref 3.5–5.5)
ALT: 126 IU/L — ABNORMAL HIGH (ref 0–44)
AST: 147 IU/L — ABNORMAL HIGH (ref 0–40)
Alkaline Phosphatase: 72 IU/L (ref 39–117)
Anion Gap: 15 mmol/L (ref 10.0–18.0)
BILIRUBIN TOTAL: 0.4 mg/dL (ref 0.0–1.2)
BUN / CREAT RATIO: 14 (ref 9–20)
BUN: 11 mg/dL (ref 6–24)
CALCIUM: 9.6 mg/dL (ref 8.7–10.2)
CO2: 26 mmol/L (ref 20–29)
Chloride: 101 mmol/L (ref 96–106)
Creatinine, Ser: 0.76 mg/dL (ref 0.76–1.27)
GFR calc Af Amer: 125 mL/min/{1.73_m2} (ref >59)
GFR, EST NON AFRICAN AMERICAN: 108 mL/min/{1.73_m2} (ref >59)
GLOBULIN, TOTAL: 3.4 g/dL (ref 1.5–4.5)
Glucose: 93 mg/dL (ref 65–99)
POTASSIUM: 4.4 mmol/L (ref 3.5–5.2)
Sodium: 142 mmol/L (ref 134–144)
Total Protein: 7.7 g/dL (ref 6.0–8.5)

## 2017-08-14 LAB — TSH: TSH: 2.15 u[IU]/mL (ref 0.450–4.500)

## 2017-08-15 ENCOUNTER — Telehealth: Payer: Self-pay | Admitting: Internal Medicine

## 2017-08-15 ENCOUNTER — Encounter: Payer: Self-pay | Admitting: Internal Medicine

## 2017-08-15 NOTE — Telephone Encounter (Signed)
Patient is wanting to know if gabapentin cause liver screw-ups

## 2017-08-15 NOTE — Telephone Encounter (Signed)
Called and spoke with Jared Bass about his blood work.  He has elevated AST/ALT at a 1.16 ratio.  He has had rising LFTs since April in our system.  He tells me that he has a sister with cryptogenic cirrhosis and he is concerned this may run in the family.  He is an ETOH drinker, but states this is only intermittent and his LFT ratio does not fit this diagnosis.   I would recommend re-testing for HCV, testing for HBV, getting iron studies as first line and a RUQ ultrasound.  Jared Bass is amenable to these tests, but would rather they be done through his PCP.   I will send a letter to his PCP today and also attempt to call his PCP's office today.   Jared Bass is comfortable with this plan and will call his PCP himself if he does not hear anything.    Debe Coder, MD

## 2017-08-18 LAB — TOXASSURE SELECT,+ANTIDEPR,UR

## 2017-09-04 ENCOUNTER — Encounter: Payer: Self-pay | Admitting: *Deleted

## 2017-09-04 NOTE — Telephone Encounter (Signed)
Pt discussed this oud and will speak w/ pcp

## 2017-09-10 ENCOUNTER — Ambulatory Visit (INDEPENDENT_AMBULATORY_CARE_PROVIDER_SITE_OTHER): Payer: Medicare Other | Admitting: Student in an Organized Health Care Education/Training Program

## 2017-09-10 DIAGNOSIS — F1120 Opioid dependence, uncomplicated: Secondary | ICD-10-CM

## 2017-09-10 DIAGNOSIS — F1099 Alcohol use, unspecified with unspecified alcohol-induced disorder: Secondary | ICD-10-CM

## 2017-09-10 DIAGNOSIS — G8929 Other chronic pain: Secondary | ICD-10-CM

## 2017-09-10 DIAGNOSIS — M4802 Spinal stenosis, cervical region: Secondary | ICD-10-CM | POA: Diagnosis not present

## 2017-09-10 DIAGNOSIS — R202 Paresthesia of skin: Secondary | ICD-10-CM

## 2017-09-10 DIAGNOSIS — R740 Nonspecific elevation of levels of transaminase and lactic acid dehydrogenase [LDH]: Secondary | ICD-10-CM

## 2017-09-10 DIAGNOSIS — M47812 Spondylosis without myelopathy or radiculopathy, cervical region: Secondary | ICD-10-CM

## 2017-09-10 DIAGNOSIS — R7401 Elevation of levels of liver transaminase levels: Secondary | ICD-10-CM | POA: Insufficient documentation

## 2017-09-10 MED ORDER — BUPRENORPHINE HCL-NALOXONE HCL 5.7-1.4 MG SL SUBL: 1.0000 | SUBLINGUAL_TABLET | Freq: Three times a day (TID) | SUBLINGUAL | 0 refills | Status: DC

## 2017-09-10 NOTE — Assessment & Plan Note (Signed)
AST and ALT elevation at last office visit, largely asymptomatic.  Hepatitis C virus negative in 2018.  Could be related to alcohol use disorder.  Exam is reassuring with no signs of cirrhosis and no other signs of acute hepatitis or acute liver failure.  I offered to repeat lab work to trend the transaminases, the patient declined saying that he is can follow-up with his primary care physician later this week and wants to have a lab work done there.

## 2017-09-10 NOTE — Progress Notes (Signed)
   09/10/2017  Jared Bass presents for follow up of opioid use disorder I have reviewed the prior induction visit, follow up visits, and telephone encounters relevant to opiate use disorder (OUD) treatment.   Current daily dose: Zubsolv 5.7mg  TID  Date of Induction: 01/22/2017  Current follow up interval, in weeks: 4  The patient has been adherent with the buprenorphine for OUD contract.   Last UDS Result: Appropriate  HPI: 47 year old male here for follow-up of opioid use disorder.  Reports good control of his cravings with current dosing of buprenorphine.  Denies any relapses since we last saw him 4 weeks ago.  Chronic pain and paresthesias from his cervical osteoarthritis and spinal stenosis are stable.  He reports that things are going okay at home.  He still has a lot of friends who views and bring opioids around hi,m.  But he denies any desire to use.  He still looking for work, gets odd jobs occasionally.  Denies any side effects from buprenorphine.  Reports good compliance.  No recent illnesses, fevers or chills, chest pain, or dyspnea on exertion.  Eating and drinking well.  Still reports occasional binge drinking type behavior, but not drinking every day.  Exam:   Vitals:   09/10/17 0848  BP: (!) 129/95  Pulse: 67  Resp: 20  Temp: 98.4 F (36.9 C)  TempSrc: Oral  SpO2: 99%  Weight: 195 lb 8 oz (88.7 kg)    General: Well-appearing man sitting up in a chair in no distress Abd: soft and nontender, no hepatomegaly or splenomegaly, no fluid wave.  Ext: warm and well perfused with no edema Psych: normal affect  Assessment/Plan:  See Problem Based Charting in the Encounters Tab   [name], MD  09/10/2017  10:24 AM

## 2017-09-10 NOTE — Assessment & Plan Note (Signed)
Opioid use disorder is stable, he is in nice remission at this point.  Urine drug screen has been normal. CSRS appropriate.  Plan is to continue with Zubsolv 5.7mg  TID.  Follow-up in 4 weeks.

## 2017-09-10 NOTE — Patient Instructions (Signed)
1. Please go for repeat liver testing with your PCP later this week.

## 2017-09-12 ENCOUNTER — Other Ambulatory Visit: Payer: Self-pay | Admitting: Internal Medicine

## 2017-09-12 DIAGNOSIS — G8929 Other chronic pain: Secondary | ICD-10-CM

## 2017-09-12 DIAGNOSIS — M5412 Radiculopathy, cervical region: Principal | ICD-10-CM

## 2017-09-27 ENCOUNTER — Other Ambulatory Visit: Payer: Self-pay | Admitting: *Deleted

## 2017-09-27 DIAGNOSIS — M5412 Radiculopathy, cervical region: Principal | ICD-10-CM

## 2017-09-27 DIAGNOSIS — G8929 Other chronic pain: Secondary | ICD-10-CM

## 2017-09-27 MED ORDER — GABAPENTIN 100 MG PO CAPS: 200.0000 mg | ORAL_CAPSULE | Freq: Two times a day (BID) | ORAL | 2 refills | Status: DC

## 2017-09-30 ENCOUNTER — Other Ambulatory Visit: Payer: Self-pay | Admitting: *Deleted

## 2017-09-30 DIAGNOSIS — M5412 Radiculopathy, cervical region: Principal | ICD-10-CM

## 2017-09-30 DIAGNOSIS — G8929 Other chronic pain: Secondary | ICD-10-CM

## 2017-10-09 ENCOUNTER — Encounter: Payer: Self-pay | Admitting: *Deleted

## 2017-10-09 NOTE — Progress Notes (Signed)
No additional letter sent - error letter encounter

## 2017-10-15 ENCOUNTER — Ambulatory Visit (INDEPENDENT_AMBULATORY_CARE_PROVIDER_SITE_OTHER): Payer: Medicare Other | Admitting: Student in an Organized Health Care Education/Training Program

## 2017-10-15 VITALS — BP 141/93 | HR 79 | Temp 98.3°F | Resp 20 | Wt 198.6 lb

## 2017-10-15 DIAGNOSIS — J209 Acute bronchitis, unspecified: Secondary | ICD-10-CM | POA: Diagnosis not present

## 2017-10-15 DIAGNOSIS — Z8701 Personal history of pneumonia (recurrent): Secondary | ICD-10-CM | POA: Diagnosis not present

## 2017-10-15 DIAGNOSIS — F1120 Opioid dependence, uncomplicated: Secondary | ICD-10-CM | POA: Diagnosis present

## 2017-10-15 DIAGNOSIS — F1020 Alcohol dependence, uncomplicated: Secondary | ICD-10-CM | POA: Diagnosis not present

## 2017-10-15 MED ORDER — BUPRENORPHINE HCL-NALOXONE HCL 5.7-1.4 MG SL SUBL: 1.0000 | SUBLINGUAL_TABLET | Freq: Three times a day (TID) | SUBLINGUAL | 0 refills | Status: DC

## 2017-10-15 MED ORDER — ALBUTEROL SULFATE HFA 108 (90 BASE) MCG/ACT IN AERS: 2.0000 | INHALATION_SPRAY | Freq: Four times a day (QID) | RESPIRATORY_TRACT | 2 refills | Status: DC | PRN

## 2017-10-15 NOTE — Assessment & Plan Note (Signed)
Moderate opioid use disorder doing well on treatment.  Cravings increased this week because he self decreased his dose.  But he still reports no relapses.  Plan to check urine tox screen today.  I reviewed the controlled substance reporting system which was appropriate.  I refilled Zubsolv 5.7mg  TID for a 4-week supply, will see the patient back in clinic in 1 month.

## 2017-10-15 NOTE — Assessment & Plan Note (Signed)
Problem over the last 2 weeks. Symptoms and exam consistent with a mild viral upper respiratory tract infection causing acute bronchitis.  We talked about supportive care.  Prescribed albuterol inhaler to be used as needed.  Talked about appropriate techniques, and I recommended using it with a spacer.

## 2017-10-15 NOTE — Assessment & Plan Note (Signed)
Mild to moderate disease.  Says last drink was about 4 days ago.  He had no elevation in his AST and ALT which could be related.  I strongly encouraged him to avoid further drinking.  I think if he is unable to quit drinking despite knowing that it is hurting his liver, we will know that his alcohol use disorder is significant enough to qualify for treatment.  Would have to be very careful about initiating Naltrexone to avoid interactions with Suboxone.

## 2017-10-15 NOTE — Progress Notes (Signed)
   10/15/2017  Jared Bass presents for follow up of opioid use disorder I have reviewed the prior induction visit, follow up visits, and telephone encounters relevant to opiate use disorder (OUD) treatment.   Current daily dose: Zubsolv 5.7mg  TID  Date of Induction: 01/22/2017  Current follow up interval, in weeks: 4  The patient has been adherent with the buprenorphine for OUD contract.   Last UDS Result: Appropriate for Bupe and metabolite  HPI: 47 year old man with a moderate opioid use disorder doing well on treatment with ZUBSOLV.  He missed his appointment with [place] last week because of conflict with work.  As a result he had to decrease his daily use of ZUBSOLV from 3 tablets down to 2 tablets.  He said he take his last 1/2 tablet dose this morning.  He did not feel any withdrawal with this change but he did feel an increase in cravings for other pills.  He denied any relapses.  Reports otherwise good benefit from ZUBSOLV, usually controls his cravings very well at 3 times daily dosing.  He reports doing well otherwise at home.  He is working in Holiday representative time.  He reports that his drinking is better, last drink was 3 or 4 days ago.  He says is mostly problem when he gets around certain people.  Also complaining of increased cough over the last couple weeks.  He was admitted to the hospital a few months ago with pneumonia.  He says he gets recurrent bronchitis.  No tobacco use.  No fevers or chills.  Mild productive sputum.  Cough throughout the day.  Says that the albuterol inhaler has helped him in the past.  Exam:   Vitals:   10/15/17 0953  BP: (!) 141/93  Pulse: 79  Resp: 20  Temp: 98.3 F (36.8 C)  TempSrc: Oral  SpO2: 100%  Weight: 198 lb 9.6 oz (90.1 kg)    General: Well-appearing man in no distress Lungs: Clear to auscultation throughout, no wheezing or crackles Extremeties: Warm well perfused Neuro: Alert and oriented, conversational, normal strength in upper and  lower extremities, normal gait Psych: Appropriate affect, does not appear anxious or depressed, well put together today.  Assessment/Plan:  See Problem Based Charting in the Encounters Tab    Jared Alias, MD  10/15/2017  9:45 AM

## 2017-10-15 NOTE — Patient Instructions (Signed)
1. Keep up the great work, take your medication as directed.   2. I sent a Ventolin inhaler for acute bronchitis. Use two puffs as needed for cough or shortness of breath. Consider buying a spacer from the pharmacy to help get as much medicine in your lungs as possible.

## 2017-10-21 ENCOUNTER — Telehealth: Payer: Self-pay | Admitting: *Deleted

## 2017-10-24 LAB — TOXASSURE SELECT,+ANTIDEPR,UR

## 2017-10-30 ENCOUNTER — Other Ambulatory Visit: Payer: Self-pay | Admitting: Internal Medicine

## 2017-11-07 NOTE — Addendum Note (Signed)
Addended by: Neomia Dear on: 11/07/2017 08:04 PM   Modules accepted: Orders

## 2017-11-12 ENCOUNTER — Telehealth: Payer: Self-pay | Admitting: *Deleted

## 2017-11-12 NOTE — Telephone Encounter (Signed)
I would have him send that to his PCP

## 2017-11-12 NOTE — Telephone Encounter (Signed)
Pt pharm has requested a refill of ranitidine 150mg  1 twice daily This was prescribed only once on 03/12/2017 by dr Brett Fairy at an oud appt. Should this request be referred to his pcp or OUD MD refill? Sending to dr's vincent and hoffman

## 2017-12-06 ENCOUNTER — Telehealth: Payer: Self-pay | Admitting: Student in an Organized Health Care Education/Training Program

## 2017-12-06 NOTE — Telephone Encounter (Signed)
Patient is wanting to get an appt for Tuesday

## 2017-12-06 NOTE — Telephone Encounter (Signed)
Attempted to reach patient to schedule appt in OUD. Recording states line has been disconnected or changed. Kinnie Feil, RN, BSN

## 2017-12-10 ENCOUNTER — Other Ambulatory Visit: Payer: Self-pay

## 2017-12-10 NOTE — Telephone Encounter (Signed)
Needs to speak with a nurse about OUD appt.

## 2017-12-10 NOTE — Telephone Encounter (Signed)
Have rtc 2x received message that call can not be completed at this time

## 2017-12-11 NOTE — Telephone Encounter (Signed)
Have called today, got the same message

## 2017-12-12 NOTE — Telephone Encounter (Signed)
No answer

## 2017-12-13 NOTE — Telephone Encounter (Signed)
Got the message again cannot be completed at this time

## 2017-12-18 ENCOUNTER — Telehealth: Payer: Self-pay | Admitting: Internal Medicine

## 2017-12-24 ENCOUNTER — Ambulatory Visit (INDEPENDENT_AMBULATORY_CARE_PROVIDER_SITE_OTHER): Payer: Medicare Other | Admitting: Internal Medicine

## 2017-12-24 VITALS — BP 127/96 | HR 94 | Temp 98.3°F | Wt 203.8 lb

## 2017-12-24 DIAGNOSIS — F1020 Alcohol dependence, uncomplicated: Secondary | ICD-10-CM | POA: Diagnosis not present

## 2017-12-24 DIAGNOSIS — R740 Nonspecific elevation of levels of transaminase and lactic acid dehydrogenase [LDH]: Secondary | ICD-10-CM

## 2017-12-24 DIAGNOSIS — F1120 Opioid dependence, uncomplicated: Secondary | ICD-10-CM

## 2017-12-24 DIAGNOSIS — R7401 Elevation of levels of liver transaminase levels: Secondary | ICD-10-CM

## 2017-12-24 MED ORDER — NALTREXONE HCL 50 MG PO TABS: ORAL_TABLET | ORAL | 0 refills | Status: DC

## 2017-12-24 NOTE — Assessment & Plan Note (Signed)
As discussed above we will change his MAT to naltrexone with the idea being that it may could treat his alcohol use disorder.

## 2017-12-24 NOTE — Assessment & Plan Note (Signed)
His chronic alcohol use appears to be complicating successful treatment of his opioid use disorder with buprenorphine.  I discussed with him today another option for treatment of IUD as well as for alcohol use disorder would be naltrexone.  I discussed the pharmacology behind this medication and that I suspect it will help curb some of his excessive alcohol use and may help control some of his cravings for opioids.  He is interested in trying this out.  We discussed the medication the risk of precipitated withdrawal however he has had pretty minimal doses of opioids over the last week.  I discussed with him taking a half dose for the first 2 days 25 mg of naltrexone daily followed by increase to 50 mg daily.  He will follow-up in 4 weeks

## 2017-12-24 NOTE — Patient Instructions (Signed)
Naltrexone tablets What is this medicine? NALTREXONE (nal TREX one) helps you to remain free of your dependence on opiate drugs or alcohol. It blocks the 'high' that these substances can give you. This medicine is combined with counseling and support groups. This medicine may be used for other purposes; ask your health care provider or pharmacist if you have questions. COMMON BRAND NAME(S): Depade, ReVia What should I tell my health care provider before I take this medicine? They need to know if you have any of these conditions: -if you have used drugs or alcohol within 7 to 10 days -kidney disease -liver disease, including hepatitis -an unusual or allergic reaction to naltrexone, other medicines, foods, dyes, or preservatives -pregnant or trying to get pregnant -breast-feeding How should I use this medicine? Take this medicine by mouth with a full glass of water. Follow the directions on the prescription label. Do not take this medicine within 7 to 10 days of taking any opioid drugs. Take your medicine at regular intervals. Do not take your medicine more often than directed. Do not stop taking except on your doctor's advice. Talk to your pediatrician regarding the use of this medicine in children. Special care may be needed. Overdosage: If you think you have taken too much of this medicine contact a poison control center or emergency room at once. NOTE: This medicine is only for you. Do not share this medicine with others. What if I miss a dose? If you miss a dose and remember on the same day, take the missed dose. If you do not remember until the next day, ask your doctor or health care professional about rescheduling your doses. Do not take double or extra doses. What may interact with this medicine? Do not take this medicine with any of the following medications: -any prescription or street opioid drug like codiene, heroin, methadone This medicine may also interact with the following  medications: -disulfiram -thioridazine This list may not describe all possible interactions. Give your health care provider a list of all the medicines, herbs, non-prescription drugs, or dietary supplements you use. Also tell them if you smoke, drink alcohol, or use illegal drugs. Some items may interact with your medicine. What should I watch for while using this medicine? Your condition will be monitored carefully while you are receiving this medicine. Visit your doctor or health care professional regularly. For this medicine to be most effective you should attend any counseling or support groups that your doctor or health care professional recommends. Do not try to overcome the effects of the medicine by taking large amounts of narcotics or by drinking large amounts of alcohol. This can cause severe problems including death. Also, you may be more sensitive to lower doses of narcotics after you stop taking this medicine. If you are going to have surgery, tell your doctor or health care professional that you are taking this medicine. Do not treat yourself for coughs, colds, pain, or diarrhea. Ask your doctor or health care professional for advice. Some of the ingredients may interact with this medicine and cause side effects. Wear a medical ID bracelet or chain, and carry a card that describes your disease and details of your medicine and dosage times. You may get drowsy or dizzy. Do not drive, use machinery, or do anything that needs mental alertness until you know how this medicine affects you. Do not stand or sit up quickly, especially if you are an older patient. This reduces the risk of dizzy or fainting spells.   Alcohol may interfere with the effect of this medicine. Avoid alcoholic drinks. What side effects may I notice from receiving this medicine? Side effects that you should report to your doctor or health care professional as soon as possible: -allergic reactions like skin rash, itching or  hives, swelling of the face, lips, or tongue -breathing problems -changes in vision, hearing -confusion -dark urine -depressed mood -diarrhea -fast or irregular heart beat -hallucination, loss of contact with reality -light-colored stools -right upper belly pain -suicidal thoughts or other mood changes -unusually weak or tired -vomiting -yellowing of the eyes or skin Side effects that usually do not require medical attention (report to your doctor or health care professional if they continue or are bothersome): -aches, pains -change in sex drive or performance -feeling anxious -headache -loss of appetite, nausea -runny nose, sinus problems, sneezing -stomach pain -trouble sleeping This list may not describe all possible side effects. Call your doctor for medical advice about side effects. You may report side effects to FDA at 1-800-FDA-1088. Where should I keep my medicine? Keep out of the reach of children. Store at room temperature between 20 and 25 degrees C (68 and 77 degrees F). Throw away any unused medicine after the expiration date. NOTE: This sheet is a summary. It may not cover all possible information. If you have questions about this medicine, talk to your doctor, pharmacist, or health care provider.  2018 Elsevier/Gold Standard (2012-04-10 10:33:18)  

## 2017-12-24 NOTE — Progress Notes (Signed)
   12/24/2017  Jared Bass presents for follow up of opioid use disorder I have reviewed the prior induction visit, follow up visits, and telephone encounters relevant to opiate use disorder (OUD) treatment.   Current daily dose: Previously Zubsolv 5.7mg  TID  Date of Induction: 01/22/2017  Current follow up interval, in weeks: 4  The patient has NOT been adherent with the buprenorphine for OUD contract. Missed visits, no longer taking Zubsolv  Last UDS Result: Appropriate for Bupe and metabolite, ETOH also present  HPI: 47 year old man with a moderate opioid use disorder as well as Alcohol dependence.  He has missed the last 2 scheduled visits with [place].  There have been phone calls from him to nursing staff where he has been intoxicated and scheduled also appointments however he has not arrived for these appointments.  Fortunately he did arrive today for his appointment he does not appear grossly intoxicated to me.  He does not remember why he missed his previous appointments he does admit to heavy alcohol consumption and does believe this may be contributing.  Over the past week he knows that he is use 1 pill of illicit Percocet as well as 1 illicit hydrocodone pill he has not used any heroin.  He does think that Septisol has helped him to control some of his cravings.  However he notes that he frequently drinks alcohol he thinks this is mostly due to inability to sleep where he will drink excessively until he sleeps for about 20 hours. Exam:   Vitals:   12/24/17 0912  BP: (!) 127/96  Pulse: 94  Temp: 98.3 F (36.8 C)  TempSrc: Oral  SpO2: 97%  Weight: 203 lb 12.8 oz (92.4 kg)    General: Well-appearing man in no distress Lungs: CTAB Extremeties: Warm well perfused Neuro: Alert and oriented Psych: Appropriate affect, does not appear anxious or depressed, well put together today.  Assessment/Plan:  See Problem Based Charting in the Encounters Tab    Gust Rung,  DO  12/24/2017  9:19 AM

## 2017-12-24 NOTE — Assessment & Plan Note (Signed)
His AST and ALT have been trending up previously was checked for hepatitis C about a year ago and was negative.  He is not sure if his PCP has checked any other liver function test.  I think today I will go ahead and repeat a liver profile as well as testing for hepatitis a B and C.  If he is not immune to hepatitis A and B be a good idea with his test of alcohol use to get him vaccinated.

## 2017-12-25 ENCOUNTER — Encounter: Payer: Self-pay | Admitting: Internal Medicine

## 2017-12-25 LAB — HEPATITIS B SURFACE ANTIBODY,QUALITATIVE: Hep B Surface Ab, Qual: NONREACTIVE

## 2017-12-25 LAB — HEPATIC FUNCTION PANEL
ALK PHOS: 66 IU/L (ref 39–117)
ALT: 66 IU/L — AB (ref 0–44)
AST: 142 IU/L — AB (ref 0–40)
Albumin: 4.1 g/dL (ref 3.5–5.5)
BILIRUBIN TOTAL: 0.7 mg/dL (ref 0.0–1.2)
BILIRUBIN, DIRECT: 0.21 mg/dL (ref 0.00–0.40)
Total Protein: 7.2 g/dL (ref 6.0–8.5)

## 2017-12-25 LAB — HEPATITIS B CORE ANTIBODY, TOTAL: HEP B C TOTAL AB: NEGATIVE

## 2017-12-25 LAB — GAMMA GT: GGT: 267 IU/L — AB (ref 0–65)

## 2017-12-25 LAB — HEPATITIS C ANTIBODY

## 2017-12-25 LAB — HEPATITIS A ANTIBODY, TOTAL: HEP A TOTAL AB: NEGATIVE

## 2017-12-25 LAB — HEPATITIS B SURFACE ANTIGEN: Hepatitis B Surface Ag: NEGATIVE

## 2017-12-27 ENCOUNTER — Telehealth: Payer: Self-pay | Admitting: *Deleted

## 2017-12-27 NOTE — Telephone Encounter (Signed)
Patient notified. Will continue taking whole tab (50 mg). Kinnie Feil, RN, BSN

## 2017-12-27 NOTE — Telephone Encounter (Signed)
Patient called in stating naltrexone is helping with alcohol craving but not percocet. States he is craving percocet and feels withdrawal symptoms. Took half tab Tuesday and Wednesday of this week and began full tab Thursday and today. Encouraged patient to continue taking as it may take awhile to work. Patient states he will but wants to know if there is anything else he can do. Kinnie Feil, RN, BSN

## 2017-12-27 NOTE — Telephone Encounter (Signed)
Unfortunately, some of this is to be expected. Naltrexsone will not help opioid craving as much, it sounds like he did not have any precipitated withdrawal which is good, I would recommend going up to the 50mg  dose of naltrexsone as we discussed in the visit and see how he does with the higher dose.

## 2017-12-31 ENCOUNTER — Telehealth: Payer: Self-pay | Admitting: *Deleted

## 2017-12-31 ENCOUNTER — Telehealth: Payer: Self-pay | Admitting: Internal Medicine

## 2017-12-31 MED ORDER — BUPRENORPHINE HCL-NALOXONE HCL 5.7-1.4 MG SL SUBL: 1.0000 | SUBLINGUAL_TABLET | Freq: Three times a day (TID) | SUBLINGUAL | 0 refills | Status: DC

## 2017-12-31 NOTE — Telephone Encounter (Signed)
Called Jared Bass back. He reports Naltrexone is effective for ETOH cravings but has not been effective for Opioid cravings, he feels like he is about to relapse.  He is willing to go to AA meetings for his ETOH use disorder.  I discussed that I will restart him on ZUBSOLV 5.7mg  TID.  He can stop naltrexone today and start ZUBSOLV tomorrow. It may not be fully effective until naltrexone is out of his system.    I asked that he follow up with our OUD clinic on 01/07/18 at 9:45am.

## 2017-12-31 NOTE — Telephone Encounter (Signed)
Call from [facility] pharmacy stating unable to approve Zubsolv rx ; "validation failed". Verbal order given - rx went thru; stating unsure why it did not go thru originally.

## 2017-12-31 NOTE — Telephone Encounter (Signed)
Patient is having withdraw and the medicine he got isn't helping. He is talking about going to the street and get some stronger pain medicine.  Pls call patient

## 2018-01-01 ENCOUNTER — Telehealth: Payer: Self-pay | Admitting: *Deleted

## 2018-01-01 NOTE — Telephone Encounter (Signed)
Pt stating he needs some help; wants Dr Mikey Bussing to call something in for him. Told him Zubsolv was called in yesterday at [facility] on [address].  States he did not know; hopes it helps or he may have to find some cocaine. States he will call [facility] now.

## 2018-01-14 ENCOUNTER — Ambulatory Visit (INDEPENDENT_AMBULATORY_CARE_PROVIDER_SITE_OTHER): Payer: Medicare Other | Admitting: Internal Medicine

## 2018-01-14 ENCOUNTER — Encounter: Payer: Self-pay | Admitting: Internal Medicine

## 2018-01-14 ENCOUNTER — Other Ambulatory Visit: Payer: Self-pay

## 2018-01-14 VITALS — BP 140/98 | HR 88 | Temp 99.1°F | Ht 68.0 in | Wt 203.1 lb

## 2018-01-14 DIAGNOSIS — F1020 Alcohol dependence, uncomplicated: Secondary | ICD-10-CM | POA: Diagnosis not present

## 2018-01-14 DIAGNOSIS — F411 Generalized anxiety disorder: Secondary | ICD-10-CM | POA: Diagnosis not present

## 2018-01-14 DIAGNOSIS — J209 Acute bronchitis, unspecified: Secondary | ICD-10-CM

## 2018-01-14 DIAGNOSIS — F1120 Opioid dependence, uncomplicated: Secondary | ICD-10-CM | POA: Diagnosis present

## 2018-01-14 DIAGNOSIS — R062 Wheezing: Secondary | ICD-10-CM | POA: Diagnosis not present

## 2018-01-14 MED ORDER — ALBUTEROL SULFATE HFA 108 (90 BASE) MCG/ACT IN AERS: 2.0000 | INHALATION_SPRAY | Freq: Four times a day (QID) | RESPIRATORY_TRACT | 2 refills | Status: DC | PRN

## 2018-01-14 MED ORDER — BUPRENORPHINE HCL-NALOXONE HCL 5.7-1.4 MG SL SUBL: 1.0000 | SUBLINGUAL_TABLET | Freq: Three times a day (TID) | SUBLINGUAL | 0 refills | Status: DC

## 2018-01-14 NOTE — Progress Notes (Deleted)
   01/14/2018  Jared Bass presents for follow up of opioid use disorder I have reviewed the prior induction visit, follow up visits, and telephone encounters relevant to opiate use disorder (OUD) treatment.   Current daily dose:  Date of Induction: ***  Current follow up interval, in weeks: ***  The patient {Has/has not:18111} been adherent with the buprenorphine for OUD contract.   Last UDS Result: ***  HPI: ***  Exam:   There were no vitals filed for this visit.  ***  Assessment/Plan:  See Problem Based Charting in the Encounters Tab     Lorenso Courier, MD  01/14/2018  8:28 AM

## 2018-01-14 NOTE — Assessment & Plan Note (Signed)
The patient has not been going to alcoholic anonymous meetings as he does not get along with the people who attend the meetings. He agreed to a psychiatric referral where he can also be connected to social work and possible counseling sessions.

## 2018-01-14 NOTE — Assessment & Plan Note (Signed)
The patient had a PHQ9 score of 6. He continues to have many psychiatric issues for which he was recommended to go to [place] during previous visit. The patient states that he does not want to go to [facility] as he felt that he felt that he was being accused for things he did not do.   We discussed with the patient that it was important for him to be seen by a mental health professional. He agreed to a psychiatric referral.

## 2018-01-14 NOTE — Patient Instructions (Addendum)
It was a pleasure to see you today Jared Bass. Please make the following changes:  -Please follow up using zubsolv three times daily  -Please go to psychiatrist that we have referred you to  -Your albuterol inhaler has been refilled  If you have any questions or concerns, please call our clinic at [phone] between 9am-5pm and after hours call [phone] and ask for the internal medicine resident on call. If you feel you are having a medical emergency please call 911.   Thank you, we look forward to help you remain healthy!  Lorenso Courier, MD Internal Medicine PGY1

## 2018-01-14 NOTE — Progress Notes (Signed)
   01/14/2018  Jared Bass presents for follow up of opioid use disorder I have reviewed the prior induction visit, follow up visits, and telephone encounters relevant to opiate use disorder (OUD) treatment.   Current daily dose: Zubsolv 5.7-1.4mg  subl tid  Date of Induction: 01/22/17  Current follow up interval, in weeks: 4  The patient Hasbeen adherent with the buprenorphine for OUD contract.   Last UDS Result:   HPI: [name] is a 47 y.o m with acute bronchitis, generalized anxiety disorder, alcohol use disorder, and gerd who presents for opioid use disorder follow up. The patient is currently on zubsolv 5.7mg  tid which he states he is doing well on. He has not been having any cravings or withdrawal symptoms.  He continues to drink 5-6 pack of beer per day with his last drink being on Friday 01/10/18.   Exam:   Vitals:   01/14/18 0933  BP: (!) 140/98  Pulse: 88  Temp: 99.1 F (37.3 C)  SpO2: 98%  Weight: 203 lb 1.6 oz (92.1 kg)  Height: 5\' 8"  (1.727 m)   Physical Exam  Constitutional: He appears well-developed and well-nourished. No distress.  HENT:  Head: Normocephalic and atraumatic.  Eyes: Conjunctivae are normal.  Cardiovascular: Normal rate, regular rhythm and normal heart sounds.  Respiratory: No respiratory distress. He has wheezes (right lower lung field).  GI: Soft. Bowel sounds are normal. He exhibits no distension. There is no tenderness.  Musculoskeletal: He exhibits no edema.  Neurological: He is alert.  Skin: He is not diaphoretic. No erythema.  Psychiatric: He has a normal mood and affect. His behavior is normal. Thought content normal.      Assessment/Plan:  See Problem Based Charting in the Encounters Tab     Lorenso Courier, MD  01/14/2018  9:43 AM

## 2018-01-14 NOTE — Assessment & Plan Note (Signed)
The patient has been adherent to his zubsolv and states that he is tolerating it well. [place] controlled substance database was congruent to what the patient is being prescribed. The patient continues to drink daily. Recommended that the patient go to alcoholics anonymous meetings.   -Prescribed zubsolv tid -ordered toxassure

## 2018-01-17 NOTE — Progress Notes (Signed)
Internal Medicine Clinic Attending  I saw and evaluated the patient.  I personally confirmed the key portions of the history and exam documented by Dr. Delma Officer and I reviewed pertinent patient test results.  The assessment, diagnosis, and plan were formulated together and I agree with the documentation in the resident's note.  Jared Bass has concomitant OUD and alcohol use disorder.  He is pre contemplative to changing his drinking habits, and denies having any coping issues while drinking despite documentation otherwise in our chart.  He did not do well on naltrexone.  He would benefit from psychiatric care for possible ADHD vs. GAD.  Referral placed today.  From an OUD perspective, he is doing well.  Follow up UDS.

## 2018-01-23 LAB — TOXASSURE SELECT,+ANTIDEPR,UR

## 2018-01-27 ENCOUNTER — Telehealth: Payer: Self-pay | Admitting: Student in an Organized Health Care Education/Training Program

## 2018-01-27 ENCOUNTER — Telehealth: Payer: Self-pay | Admitting: Internal Medicine

## 2018-01-27 NOTE — Telephone Encounter (Signed)
He received a 30 day supply on 7/16.  Should not be out of it yet.  Myriam Jacobson, can you call him and find out what's going on?

## 2018-01-27 NOTE — Telephone Encounter (Signed)
Thanks, I will not refill till we hear from him given his other comorbidities and recent refill.

## 2018-01-27 NOTE — Telephone Encounter (Signed)
No answer when called, no vmail after 12 rings

## 2018-01-27 NOTE — Telephone Encounter (Signed)
Needs refill on suboxene, pt contact# [phone]. Pls call patient his next appt is set for 08/13

## 2018-01-29 ENCOUNTER — Other Ambulatory Visit: Payer: Self-pay | Admitting: *Deleted

## 2018-01-29 MED ORDER — BUPRENORPHINE HCL-NALOXONE HCL 5.7-1.4 MG SL SUBL
1.0000 | SUBLINGUAL_TABLET | Freq: Three times a day (TID) | SUBLINGUAL | 0 refills | Status: DC
Start: 2018-01-29 — End: 2018-02-20

## 2018-01-29 NOTE — Telephone Encounter (Signed)
Rx phoned into pharmacy.Jared Spittle Cassady7/31/20193:46 PM

## 2018-02-11 ENCOUNTER — Telehealth: Payer: Self-pay

## 2018-02-11 NOTE — Telephone Encounter (Signed)
Called rescheduled for 8/22

## 2018-02-11 NOTE — Telephone Encounter (Signed)
Needs to speak with a nurse about OUD appt. Please call pt back.

## 2018-02-11 NOTE — Telephone Encounter (Signed)
Pt is calling back to speak with a nurse. 

## 2018-02-11 NOTE — Telephone Encounter (Signed)
Pt needs to reschedule appt having car troubles, pls call pt# [phone]

## 2018-02-11 NOTE — Telephone Encounter (Signed)
Message given to Helen

## 2018-02-19 DIAGNOSIS — M545 Low back pain: Secondary | ICD-10-CM | POA: Diagnosis not present

## 2018-02-19 DIAGNOSIS — E559 Vitamin D deficiency, unspecified: Secondary | ICD-10-CM | POA: Diagnosis not present

## 2018-02-19 DIAGNOSIS — E785 Hyperlipidemia, unspecified: Secondary | ICD-10-CM | POA: Diagnosis not present

## 2018-02-19 DIAGNOSIS — I2699 Other pulmonary embolism without acute cor pulmonale: Secondary | ICD-10-CM | POA: Diagnosis not present

## 2018-02-19 DIAGNOSIS — R002 Palpitations: Secondary | ICD-10-CM | POA: Diagnosis not present

## 2018-02-20 ENCOUNTER — Ambulatory Visit (INDEPENDENT_AMBULATORY_CARE_PROVIDER_SITE_OTHER): Payer: Medicare Other | Admitting: Internal Medicine

## 2018-02-20 ENCOUNTER — Other Ambulatory Visit: Payer: Self-pay

## 2018-02-20 DIAGNOSIS — F411 Generalized anxiety disorder: Secondary | ICD-10-CM

## 2018-02-20 DIAGNOSIS — H2189 Other specified disorders of iris and ciliary body: Secondary | ICD-10-CM

## 2018-02-20 DIAGNOSIS — F1020 Alcohol dependence, uncomplicated: Secondary | ICD-10-CM | POA: Diagnosis not present

## 2018-02-20 DIAGNOSIS — F1120 Opioid dependence, uncomplicated: Secondary | ICD-10-CM | POA: Diagnosis present

## 2018-02-20 MED ORDER — BUPRENORPHINE HCL-NALOXONE HCL 5.7-1.4 MG SL SUBL: 1.0000 | SUBLINGUAL_TABLET | Freq: Three times a day (TID) | SUBLINGUAL | 0 refills | Status: DC

## 2018-02-20 NOTE — Progress Notes (Signed)
   02/20/2018  [name] presents for follow up of opioid use disorder I have reviewed the prior induction visit, follow up visits, and telephone encounters relevant to opiate use disorder (OUD) treatment.   Current daily dose: Zubsolv 5.7 TID  Date of Induction: 01/22/17  Current follow up interval, in weeks: 4  The patient has been adherent with the buprenorphine for OUD contract.   Last UDS Result: Buprenorphine and ETOH  HPI: [name] is a 47yo man with OUD who presents for follow up.  He notes that he is doing well as concerns his OUD.  He has no craving and no relapse.  He has been having car trouble which has limited his ability to see psychiatry or attend AA meetings.  He seems to admit today that AA meetings would help him.  He expresses a stressor that his cousin is dealing with metastatic melanoma which is hard on the family.   Exam:   Vitals:   02/20/18 0915  BP: 130/79  Pulse: 74  Temp: 99.2 F (37.3 C)  TempSrc: Oral  SpO2: 100%  Weight: 197 lb 1.6 oz (89.4 kg)  Height: 5\' 8"  (1.727 m)    General: Normal state of health, no distress Eyes: He has a deformity of the left iris, which is chronic, anicteric sclerae Pulmonary: Breathing comfortably, no audible wheezing Psych: Pleasant, thought process normal.  Assessment/Plan:  See Problem Based Charting in the Encounters Tab     Inez Catalina, MD  02/20/2018  9:34 AM

## 2018-02-20 NOTE — Patient Instructions (Signed)
Jared Bass - -  Thank you for coming in today!  When you car is fixed, please make an appointment with psychiatry and try AA meetings to help with your drinking.   Come back in 4 weeks.

## 2018-02-21 ENCOUNTER — Encounter: Payer: Self-pay | Admitting: Internal Medicine

## 2018-02-21 NOTE — Assessment & Plan Note (Signed)
Continues to use ETOH, appears on most UDS that we have.  He has been precontemplative, but tells me today he has considered going to [employer]  Continue to support sobriety and AA meetings.

## 2018-02-21 NOTE — Assessment & Plan Note (Signed)
He is doing well on zubsolv at current dose.  Denies current use or cravings. Refill history is appropriate based on PRMP.  Last UDS showed buprenorphine and ETOH.    Plan Continue Zubsolv TID Consider U tox at next visit if needed Return in 4 weeks

## 2018-02-21 NOTE — Assessment & Plan Note (Signed)
He has not scheduled a psychiatry appointment due to car issues.  Will discuss telepsych with him at next visit as an option.

## 2018-03-18 ENCOUNTER — Other Ambulatory Visit: Payer: Self-pay | Admitting: *Deleted

## 2018-03-18 MED ORDER — BUPRENORPHINE HCL-NALOXONE HCL 5.7-1.4 MG SL SUBL
1.0000 | SUBLINGUAL_TABLET | Freq: Three times a day (TID) | SUBLINGUAL | 0 refills | Status: DC
Start: 2018-03-18 — End: 2018-03-25

## 2018-03-18 NOTE — Telephone Encounter (Signed)
Pt states he has a respiratory virus that he picked up from his mom, he does have a cough and sounds congested, rescheduled to 9/24 oud. Needs refill

## 2018-03-18 NOTE — Telephone Encounter (Signed)
Will send in 1 week supply to get him to next week.  Thanks!

## 2018-03-19 NOTE — Telephone Encounter (Signed)
Review visits/ meds 

## 2018-03-25 ENCOUNTER — Ambulatory Visit (INDEPENDENT_AMBULATORY_CARE_PROVIDER_SITE_OTHER): Payer: Medicare Other | Admitting: Internal Medicine

## 2018-03-25 VITALS — BP 132/86 | HR 80 | Temp 98.5°F | Resp 20 | Wt 200.3 lb

## 2018-03-25 DIAGNOSIS — M5412 Radiculopathy, cervical region: Secondary | ICD-10-CM

## 2018-03-25 DIAGNOSIS — K219 Gastro-esophageal reflux disease without esophagitis: Secondary | ICD-10-CM

## 2018-03-25 DIAGNOSIS — R12 Heartburn: Secondary | ICD-10-CM

## 2018-03-25 DIAGNOSIS — F1120 Opioid dependence, uncomplicated: Secondary | ICD-10-CM | POA: Diagnosis present

## 2018-03-25 DIAGNOSIS — G8929 Other chronic pain: Secondary | ICD-10-CM

## 2018-03-25 MED ORDER — BUPRENORPHINE HCL-NALOXONE HCL 5.7-1.4 MG SL SUBL: 1.0000 | SUBLINGUAL_TABLET | Freq: Three times a day (TID) | SUBLINGUAL | 0 refills | Status: DC

## 2018-03-25 MED ORDER — GABAPENTIN 300 MG PO CAPS: 300.0000 mg | ORAL_CAPSULE | Freq: Two times a day (BID) | ORAL | 1 refills | Status: DC

## 2018-03-25 NOTE — Assessment & Plan Note (Signed)
Reviewed Jared Bass appropriate Check Utox Continue zubsolv 5.7mg  film TID Ongoing follow up with Psych and AA meetings for ETOH abuse. Follow up 4 weeks

## 2018-03-25 NOTE — Progress Notes (Signed)
   03/25/2018  Jared Bass presents for follow up of opioid use disorder I have reviewed the prior induction visit, follow up visits, and telephone encounters relevant to opiate use disorder (OUD) treatment.   Current daily dose: Zubsolv 5.7 TID  Date of Induction: 01/22/17  Current follow up interval, in weeks: 4  The patient has been adherent with the buprenorphine for OUD contract.   Last UDS Result: Buprenorphine and ETOH  HPI: Mr. Athey is a 47yo man with OUD who presents for follow up.  He notes that he is doing well as concerns his OUD.  He has no craving and no relapse.  He has continue to have car trouble which has limited his ability to see psychiatry or attend AA meetings however he reports that his car has been fixed as of yesterday.  He remains concerned about his cousin who has metastatic melanoma.  He does feel that he has cut back some on his ETOH consumption in the last month.  Exam:   Vitals:   03/25/18 1003  BP: 132/86  Pulse: 80  Resp: 20  Temp: 98.5 F (36.9 C)  TempSrc: Oral  SpO2: 99%  Weight: 200 lb 4.8 oz (90.9 kg)    General: Normal state of health, no distress, appears well kept Pulmonary: CTAB Heart RRR Psych: Pleasant, thought process normal.  Assessment/Plan:  See Problem Based Charting in the Encounters Tab     Gust Rung, DO  03/25/2018  11:35 AM

## 2018-03-25 NOTE — Patient Instructions (Signed)
Try picking up Omeprazole (Prilosec OTC) and take 20mg  once a day for about a month to see if this helps your heartburn.

## 2018-03-30 LAB — TOXASSURE SELECT,+ANTIDEPR,UR

## 2018-04-08 ENCOUNTER — Telehealth: Payer: Self-pay

## 2018-04-08 NOTE — Telephone Encounter (Signed)
Spoke w/ pt about problems with housing, his situation is not good at present and he needs assist Put him in touch w/ [place] [facility]

## 2018-04-08 NOTE — Telephone Encounter (Signed)
Requesting to speak with Jared Bass. Please call back.  

## 2018-04-22 ENCOUNTER — Ambulatory Visit (INDEPENDENT_AMBULATORY_CARE_PROVIDER_SITE_OTHER): Payer: Medicare Other | Admitting: Student in an Organized Health Care Education/Training Program

## 2018-04-22 VITALS — BP 118/84 | HR 104 | Temp 96.6°F | Resp 20 | Wt 210.1 lb

## 2018-04-22 DIAGNOSIS — M542 Cervicalgia: Secondary | ICD-10-CM

## 2018-04-22 DIAGNOSIS — F1120 Opioid dependence, uncomplicated: Secondary | ICD-10-CM | POA: Diagnosis present

## 2018-04-22 MED ORDER — RANITIDINE HCL 150 MG PO TABS: 150.0000 mg | ORAL_TABLET | Freq: Every day | ORAL | 1 refills | Status: DC

## 2018-04-22 MED ORDER — ALBUTEROL SULFATE HFA 108 (90 BASE) MCG/ACT IN AERS: 2.0000 | INHALATION_SPRAY | Freq: Four times a day (QID) | RESPIRATORY_TRACT | 2 refills | Status: DC | PRN

## 2018-04-22 MED ORDER — BUPRENORPHINE HCL-NALOXONE HCL 5.7-1.4 MG SL SUBL: 1.0000 | SUBLINGUAL_TABLET | Freq: Two times a day (BID) | SUBLINGUAL | 0 refills | Status: DC

## 2018-04-22 NOTE — Progress Notes (Signed)
   04/22/2018  Jared Bass presents for follow up of opioid use disorder I have reviewed the prior induction visit, follow up visits, and telephone encounters relevant to opiate use disorder (OUD) treatment.   Current daily dose: Zubsolv 5.7 TID  Date of Induction: 01/22/17  Current follow up interval, in weeks: 4  The patient has been adherent with the buprenorphine for OUD contract.   Last UDS Result: Appropriate  HPI: 47 year old man here for follow up of moderate OUD. He is doing well, reports intermittent compliance with Zubsolv. He is interested in coming off the medicine in the future. He feels he has been stable for over a year now. He doesn't want to be on a medicine forever. He has tried skipping some days, but has felt withdrawal, and goes back to using zubsolv. Denies any relapses, no other opioid use. His cousin died recently which has been a big trauma for his family. He los his housing, currently living with his brother in [place]. Not working, reports his mood is ok. Also complains of flare of his cervical neck pain, but it is not function limiting.  Exam:   Vitals:   04/22/18 0942  BP: 118/84  Pulse: (!) 104  Resp: 20  Temp: (!) 96.6 F (35.9 C)  TempSrc: Oral  SpO2: 96%  Weight: 210 lb 1.6 oz (95.3 kg)    Gen: well appearing man, no distress.  Ext: warm, well perfused Neuro: alert, oriented, conversational, normal strength throughout, normal gait Psych: Normal affect, not depressed or anxious appearing   Assessment/Plan:  See Problem Based Charting in the Encounters Tab     [name], MD  04/22/2018  10:04 AM

## 2018-04-22 NOTE — Assessment & Plan Note (Signed)
Stable. We talked about the natural progression of OUD, about it's chronic nature, and the risks of coming off bupe treatment, including overdose. He wants to at least come done on his dose, which I think is reasonable given his stability. Plan is to decrease Zubsolv 5.7mg  from TID down to BID dosing. Follow up in one month. Call us if he has cravings or withdrawal symptoms that requires going back up on the dose.

## 2018-05-27 ENCOUNTER — Ambulatory Visit: Payer: Medicare Other | Admitting: Licensed Clinical Social Worker

## 2018-05-27 ENCOUNTER — Ambulatory Visit (INDEPENDENT_AMBULATORY_CARE_PROVIDER_SITE_OTHER): Payer: Medicare Other | Admitting: Internal Medicine

## 2018-05-27 VITALS — BP 123/87 | HR 67 | Temp 98.7°F | Wt 207.9 lb

## 2018-05-27 DIAGNOSIS — F1020 Alcohol dependence, uncomplicated: Secondary | ICD-10-CM

## 2018-05-27 DIAGNOSIS — F1120 Opioid dependence, uncomplicated: Secondary | ICD-10-CM

## 2018-05-27 DIAGNOSIS — F411 Generalized anxiety disorder: Secondary | ICD-10-CM

## 2018-05-27 MED ORDER — BUPRENORPHINE HCL-NALOXONE HCL 5.7-1.4 MG SL SUBL: SUBLINGUAL_TABLET | SUBLINGUAL | 0 refills | Status: DC

## 2018-05-27 NOTE — Progress Notes (Signed)
   05/27/2018  Jared Bass presents for follow up of opioid use disorder I have reviewed the prior induction visit, follow up visits, and telephone encounters relevant to opiate use disorder (OUD) treatment.   Current daily dose: Zubsolv 5.7 BID  Date of Induction: 01/22/17  Current follow up interval, in weeks: 4  The patient has been adherent with the buprenorphine for OUD contract.   Last UDS Result: appropriate, + ETOH  HPI: [name] is a 47yo man with OUD who presents for buprenorphine therapy.  Jared Bass is doing well.  He has had some stress in his life and is living in [place] now.  He has tried BID dosing and feels like his cravings have returned strongly.  He would like to try a slower taper of his buprenorphine.  We discussed our behavioral health counselor now here in the clinic and he would like to speak with her today.  He has no other issues, no withdrawal symptoms.    Exam:   Vitals:   05/27/18 1054  BP: 123/87  Pulse: 67  Temp: 98.7 F (37.1 C)  TempSrc: Oral  SpO2: 100%  Weight: 207 lb 14.4 oz (94.3 kg)    General: Awake, alert, NAD Eyes: Anicteric sclerae, no injection Pulm: Breathing comfortably, no wheezing Psych: Pleasant, conversant, oriented to person, place.  Assessment/Plan:  See Problem Based Charting in the Encounters Tab     Inez Catalina, MD  05/27/2018  11:04 AM

## 2018-05-27 NOTE — Patient Instructions (Signed)
Jared Bass - -  I am glad to hear you are doing well.   For your opiate use, to help with withdrawal and cravings please increase to 2.5 tablets per day.  We will discuss decreasing back to 2 tablets/films per day at your next visit.   Thank you!  Please come back to the clinic on July 08, 2018.

## 2018-05-28 ENCOUNTER — Encounter: Payer: Self-pay | Admitting: Licensed Clinical Social Worker

## 2018-05-28 NOTE — BH Specialist Note (Signed)
Integrated Behavioral Health Initial Visit  MRN: [id number] Name: Jared Bass  Number of Integrated Behavioral Health Clinician visits:: 1/6 Session Start time: 11:12  Session End time: 11:40 Total time: 28 minutes  Type of Service: Integrated Behavioral Health- Individual/Family Interpretor:No.     Warm Hand Off Completed. Yes, by Dr. Criselda Peaches      SUBJECTIVE: Jared Bass is a 47 y.o. male  whom attended the session indivudally.  Patient was referred by Dr. Criselda Peaches for grief counseling. Patient reports the following symptoms/concerns: history of unhealthy coping skills, recent family loss, housing issues, financial strain, sleep disturbances, and racing thoughts.  Duration of problem: Over 10 years, but has increased since the loss of his cousin one month ago; Severity of problem: moderate  OBJECTIVE: Mood: Hopeless and Affect: Appropriate Risk of harm to self or others: No plan to harm self or others  LIFE CONTEXT: Family and Social: Patient is currently living with his brother. Patient reported this is a temporary housing situation, and is hoping to find a more permanent housing option. Patient is currently on the waiting list for [employer].  School/Work: Patient is on disability. Patient reported he would eventually like to go back to school, but feels he has difficulty focusing.  Self-Care: This area needs improvement. Patient acknowledged that he has some unhealthy daily coping skills (drinks around 1 pint of vodka daily) and avoidance that need to be reduced. Patient reported that he wants to feel young again.  Life Changes: Lost his cousin one month ago.   GOALS ADDRESSED: Patient will: 1. Reduce symptoms of: depression and insomnia 2. Increase knowledge and/or ability of: coping skills, healthy habits and cope with the loss of his family member.  3. Demonstrate ability to: Increase healthy adjustment to current life circumstances, Increase adequate  support systems for patient/family, Decrease self-medicating behaviors and Begin healthy grieving over loss  INTERVENTIONS: Interventions utilized: Motivational Interviewing, Brief CBT, Supportive Counseling and Link to [facility]. MI was used to measure the patient's readiness to acknowledge unhealthy coping skills and patterns in his daily life. MI was used to identify any barriers to change. CBT was  used to process avoidant thought patterns, and how they have negatively impacted the patient. Therapist praised the patient for his bravery in acknowledging his alcohol intake.   Standardized Assessments completed: Not Needed. Therapist assessed for SI, HI, and self-harm.   ASSESSMENT: Patient currently experiencing issues with his sleep patterns, racing thoughts, difficulty concentrating, increased depressive symptoms since the loss of his cousin, avoidant type behaviors, and housing issues.  Patient has regular self-defeating thoughts and has difficulty challenging them. Patient is in the contemplation stage of change regarding his alcohol use. Patient is not ready to make any changes at this time, and declined any assistance with addressing the alcohol use. However, patient made great strides with acknowledging his alcohol use for the first time in the clinic. Patient was provided with a list of housing options in [place] to assist with his temporary housing situation.    Patient may benefit from weekly to bi-weekly outpatient therapy to increase the patient's healthy coping skills, address periods of avoidance, and decrease alcohol usage.  PLAN: 1. Follow up with behavioral health clinician on : recommended one week. Patient plans to return at his next OUD visit.  2. Behavioral recommendations: CBT homework was assigned to the patient to practice healthy coping skills that provide him with the feeling of "feeling young again" that the patient is searching  for.  3. Referral(s):  [facility] (In Clinic)   [place], [place], [place]

## 2018-05-28 NOTE — Assessment & Plan Note (Signed)
To be discussed with Phineas Semen, our behavioral health counselor today.  I continue to recommend AA to him.

## 2018-05-28 NOTE — Assessment & Plan Note (Signed)
Doing well.  Wants to continue to taper, but would like to try a slower taper.  Will plan to give him enough medication to take 2.5 strips per day until the next appointment.  At that time, he should wean down to 2 strips BID for 1-2 months.   Plan UDS today 6 week follow up given the holiday [place] CSRS appropriate Zubsolv 2.5 films daily for 6 weeks, continue wean to 2 films daily at next visit.

## 2018-06-02 LAB — TOXASSURE SELECT,+ANTIDEPR,UR

## 2018-06-19 ENCOUNTER — Other Ambulatory Visit: Payer: Self-pay | Admitting: Student in an Organized Health Care Education/Training Program

## 2018-06-19 NOTE — Telephone Encounter (Signed)
Next OUD appt 07/08/18. 

## 2018-07-03 ENCOUNTER — Other Ambulatory Visit: Payer: Self-pay | Admitting: Student in an Organized Health Care Education/Training Program

## 2018-07-03 NOTE — Telephone Encounter (Signed)
Next OUD appt 07/08/18.

## 2018-07-03 NOTE — Telephone Encounter (Deleted)
Requesting 90 day supply.

## 2018-07-08 ENCOUNTER — Other Ambulatory Visit: Payer: Self-pay

## 2018-07-08 ENCOUNTER — Ambulatory Visit (INDEPENDENT_AMBULATORY_CARE_PROVIDER_SITE_OTHER): Payer: Medicare Other | Admitting: Internal Medicine

## 2018-07-08 DIAGNOSIS — F411 Generalized anxiety disorder: Secondary | ICD-10-CM | POA: Diagnosis not present

## 2018-07-08 DIAGNOSIS — H579 Unspecified disorder of eye and adnexa: Secondary | ICD-10-CM | POA: Diagnosis not present

## 2018-07-08 DIAGNOSIS — M4722 Other spondylosis with radiculopathy, cervical region: Secondary | ICD-10-CM

## 2018-07-08 DIAGNOSIS — F1120 Opioid dependence, uncomplicated: Secondary | ICD-10-CM

## 2018-07-08 DIAGNOSIS — M20002 Unspecified deformity of left finger(s): Secondary | ICD-10-CM | POA: Diagnosis not present

## 2018-07-08 MED ORDER — BUPRENORPHINE HCL-NALOXONE HCL 5.7-1.4 MG SL SUBL: SUBLINGUAL_TABLET | SUBLINGUAL | 0 refills | Status: DC

## 2018-07-08 NOTE — Patient Instructions (Signed)
Shiheem - -  You are doing very well!  We will keep you on 2.5 tablets of zubsolv for 4 more weeks.  We will discuss weaning to 2 tablets daily at next visit.   Please come back to see [place] in 4 weeks.   Thank you!

## 2018-07-08 NOTE — Assessment & Plan Note (Signed)
Improved.  Will plan to have him meet with Lysle Rubens at next visit.

## 2018-07-08 NOTE — Progress Notes (Signed)
   07/08/2018  [name] presents for follow up of opioid use disorder I have reviewed the prior induction visit, follow up visits, and telephone encounters relevant to opiate use disorder (OUD) treatment.   Current daily dose: Zubsolv 5.7mg /1.4 twice a day and 1/2 of this once per day  Date of Induction: 01/22/17  Current follow up interval, in weeks: 4  The patient has been adherent with the buprenorphine for OUD contract.   Last UDS Result: Appropriate for Bupe, also ETOH and THC  HPI: Jared Bass is a 48 year old man with opioid use disorder who has been on treatment for 1.5 years.  He is doing very well and is stabilized on treatment.  We have been slowly weaning his buprenorphine down.  He has a pain component which has slightly worsened upon weaning.  he has been on 2.5 tablets now for about 6 weeks and is doing well.  He does have a little worsening of his neck pain, but he has known cervical arthritis.  He is having new numbness of the 4th and 5th digit on the left hand and would like his PCP to refer him back to neurology.  He has had family stress with his mother being ill and his sister having her gallbladder out, but is overall doing well today.   Exam:   Vitals:   07/08/18 0938  BP: (!) 141/97  Pulse: 71  Temp: 98.2 F (36.8 C)  TempSrc: Oral  SpO2: 98%  Weight: 212 lb 4.8 oz (96.3 kg)  Height: 5\' 8"  (1.727 m)   Physical Exam:  General: Pleasant, well dressed, appears very well Eyes: He has chronic changes of the right eye, anicteric sclerae Pulm: Breathing comfortably, no wheezing Neuro: Grossly strength intact, some chronic changes to 4th finger on left hand after an old break.  Some change in sensation of the 4th and 5th digit to touch on the left hand.   Psych: Pleasant, conversant, normal mood.   Assessment/Plan:  See Problem Based Charting in the Encounters Tab     Inez Catalina, MD  07/08/2018  10:00 AM

## 2018-07-08 NOTE — Assessment & Plan Note (Signed)
He is doing well on 2.5 tablets per day.  He reports a slight increase in pain.  We discussed further weaning of the buprenorphine and he would like to give it 4 more weeks to see if the pain improves.    Plan Continue Zubsolv 5.7mg  2.5 tablets daily (split up over 3 doses) NCRS appropriate No UDS today.

## 2018-07-22 ENCOUNTER — Other Ambulatory Visit: Payer: Self-pay

## 2018-07-22 NOTE — Telephone Encounter (Signed)
Needs to speak with a nurse about meds. Please call pt back.  

## 2018-07-23 NOTE — Telephone Encounter (Signed)
Rtc, no answer 

## 2018-07-28 NOTE — Telephone Encounter (Signed)
Rtc, no answer 

## 2018-08-05 ENCOUNTER — Ambulatory Visit: Payer: Medicare Other | Admitting: Licensed Clinical Social Worker

## 2018-09-29 ENCOUNTER — Telehealth: Payer: Self-pay | Admitting: Internal Medicine

## 2018-09-29 MED ORDER — BUPRENORPHINE HCL-NALOXONE HCL 5.7-1.4 MG SL SUBL: 1.0000 | SUBLINGUAL_TABLET | Freq: Two times a day (BID) | SUBLINGUAL | 0 refills | Status: DC

## 2018-09-29 NOTE — Telephone Encounter (Signed)
Patient called in today to request a restart of buprenorphine for treatment of opioid use disorder.  Patient is well-known to [place], has been on treatments on and off for ODD since July 2018.  We last saw him in January.  He missed an appointment with [place] and did not reschedule.  Has been off of buprenorphine for few weeks now.  Says has had trouble with housing, been homeless for a little while, living out of his car, now living at a cousin's house.  Had several stressful events happened in his life.  Bought some Suboxone from a dealer to help with withdrawals.  Last heroin use was 3 weeks ago.  He feels like his cravings are out of control right now.  Plan is to restart ZUBSOLV 5.7 mg 2 tablets daily.  I sent a 2-week supply to his pharmacy.  We will talk again by phone in 2 weeks.  I gave him warnings about home induction.  Advised to call if he has any questions.  Patient understands.  Erlinda Hong, MD

## 2018-09-29 NOTE — Telephone Encounter (Signed)
OUD patient would like a call back about getting medications from the pharmacy.  Patient states it's been a while since he was seen.

## 2018-10-02 ENCOUNTER — Other Ambulatory Visit: Payer: Self-pay | Admitting: *Deleted

## 2018-10-02 MED ORDER — RANITIDINE HCL 150 MG PO TABS: 150.0000 mg | ORAL_TABLET | Freq: Every day | ORAL | 0 refills | Status: DC

## 2018-10-13 ENCOUNTER — Telehealth: Payer: Self-pay | Admitting: Internal Medicine

## 2018-10-13 NOTE — Telephone Encounter (Signed)
Attempted to call patient to address refill needs.  No answer.  No identifiable voicemail so did not leave message at this time.   Debe Coder, MD

## 2018-10-20 ENCOUNTER — Other Ambulatory Visit: Payer: Self-pay

## 2018-10-20 NOTE — Telephone Encounter (Signed)
Buprenorphine HCl-Naloxone HCl (ZUBSOLV) 5.7-1.4 MG SUBL, REFILL REQUEST @  [id number] - [place], [place] - [address] [phone] (Phone) [phone] (Fax)

## 2018-10-21 MED ORDER — BUPRENORPHINE HCL-NALOXONE HCL 5.7-1.4 MG SL SUBL: 1.0000 | SUBLINGUAL_TABLET | Freq: Two times a day (BID) | SUBLINGUAL | 0 refills | Status: DC

## 2018-10-21 NOTE — Telephone Encounter (Signed)
Refilled 1 month supply

## 2018-12-18 ENCOUNTER — Other Ambulatory Visit: Payer: Self-pay

## 2018-12-18 MED ORDER — ZUBSOLV 5.7-1.4 MG SL SUBL: 1.0000 | SUBLINGUAL_TABLET | Freq: Two times a day (BID) | SUBLINGUAL | 0 refills | Status: DC

## 2018-12-18 NOTE — Telephone Encounter (Signed)
Buprenorphine HCl-Naloxone HCl (ZUBSOLV) 5.7-1.4 MG SUBL, refill request

## 2019-01-22 ENCOUNTER — Other Ambulatory Visit: Payer: Self-pay | Admitting: Internal Medicine

## 2019-01-23 NOTE — Telephone Encounter (Signed)
Has not been seen in many months for OUD. Will need to come in on 7/28 to [place] clinic for evaluation. I will approve a 1 week supply only to bridge to that appointment.

## 2019-02-18 ENCOUNTER — Telehealth: Payer: Self-pay

## 2019-02-18 NOTE — Telephone Encounter (Signed)
Requesting to speak with a nurse about meds. Please call pt back.  

## 2019-02-23 ENCOUNTER — Other Ambulatory Visit: Payer: Self-pay | Admitting: Student in an Organized Health Care Education/Training Program

## 2019-02-23 NOTE — Telephone Encounter (Signed)
Filled today

## 2019-02-24 ENCOUNTER — Other Ambulatory Visit: Payer: Self-pay

## 2019-02-24 ENCOUNTER — Encounter: Payer: Medicare Other | Admitting: Internal Medicine

## 2019-02-24 ENCOUNTER — Other Ambulatory Visit: Payer: Self-pay | Admitting: Internal Medicine

## 2019-02-24 ENCOUNTER — Telehealth: Payer: Self-pay | Admitting: Internal Medicine

## 2019-02-24 MED ORDER — ZUBSOLV 5.7-1.4 MG SL SUBL: 1.0000 | SUBLINGUAL_TABLET | Freq: Two times a day (BID) | SUBLINGUAL | 0 refills | Status: AC

## 2019-02-24 NOTE — Telephone Encounter (Signed)
Attempted to call Jared Bass on his mobile phone number for telephone visit but went straight to voicemail. Voicemail left with callback number.

## 2019-02-24 NOTE — Telephone Encounter (Signed)
Attempt #2 to try to have telehealth visit with [name]. Did not pick up, went straight to voicemail. Left voicemail with plan to provide 1 week supply and emphasized that he needs to call back to clinic for in-person visit. Call back number provided.

## 2019-03-21 ENCOUNTER — Other Ambulatory Visit: Payer: Self-pay | Admitting: Internal Medicine

## 2019-03-23 NOTE — Telephone Encounter (Signed)
Refill for zubsolv denied. He has had multiple one time refills with expectations for follow up but has not been seen. Please have him schedule a follow up if he would like to restart this medication. Thanks.

## 2019-03-23 NOTE — Telephone Encounter (Signed)
Received refill request for zubsolv from pt's pharmacy.  CMA will check with OUD Attending MDl before refusing refill as pt is overdue for a visit.  Please advise.Regenia Skeeter, Darlene Cassady9/21/202010:25 AM

## 2019-03-28 ENCOUNTER — Other Ambulatory Visit: Payer: Self-pay | Admitting: Internal Medicine

## 2019-04-05 ENCOUNTER — Emergency Department (HOSPITAL_COMMUNITY)
Admission: EM | Admit: 2019-04-05 | Discharge: 2019-04-05 | Disposition: A | Discharge status: Home / Self Care | Payer: Medicare Other | Attending: Emergency Medicine | Admitting: Emergency Medicine

## 2019-04-05 DIAGNOSIS — Z7901 Long term (current) use of anticoagulants: Secondary | ICD-10-CM | POA: Insufficient documentation

## 2019-04-05 DIAGNOSIS — R112 Nausea with vomiting, unspecified: Secondary | ICD-10-CM | POA: Diagnosis present

## 2019-04-05 DIAGNOSIS — F10230 Alcohol dependence with withdrawal, uncomplicated: Secondary | ICD-10-CM | POA: Diagnosis not present

## 2019-04-05 DIAGNOSIS — Z79899 Other long term (current) drug therapy: Secondary | ICD-10-CM | POA: Diagnosis not present

## 2019-04-05 DIAGNOSIS — I10 Essential (primary) hypertension: Secondary | ICD-10-CM | POA: Diagnosis not present

## 2019-04-05 LAB — CBC
HCT: 45 % (ref 39.0–52.0)
Hemoglobin: 15.1 g/dL (ref 13.0–17.0)
MCH: 32.2 pg (ref 26.0–34.0)
MCHC: 33.6 g/dL (ref 30.0–36.0)
MCV: 95.9 fL (ref 80.0–100.0)
Platelets: 85 10*3/uL — ABNORMAL LOW (ref 150–400)
RBC: 4.69 MIL/uL (ref 4.22–5.81)
RDW: 13.5 % (ref 11.5–15.5)
WBC: 5.4 10*3/uL (ref 4.0–10.5)
nRBC: 0 % (ref 0.0–0.2)

## 2019-04-05 LAB — COMPREHENSIVE METABOLIC PANEL
ALT: 39 U/L (ref 0–44)
AST: 110 U/L — ABNORMAL HIGH (ref 15–41)
Albumin: 4.7 g/dL (ref 3.5–5.0)
Alkaline Phosphatase: 69 U/L (ref 38–126)
Anion gap: 10 (ref 5–15)
BUN: 8 mg/dL (ref 6–20)
CO2: 26 mmol/L (ref 22–32)
Calcium: 9.2 mg/dL (ref 8.9–10.3)
Chloride: 103 mmol/L (ref 98–111)
Creatinine, Ser: 0.65 mg/dL (ref 0.61–1.24)
GFR calc Af Amer: >60 mL/min (ref >60)
GFR calc non Af Amer: >60 mL/min (ref >60)
Glucose, Bld: 140 mg/dL — ABNORMAL HIGH (ref 70–99)
Potassium: 3.9 mmol/L (ref 3.5–5.1)
Sodium: 139 mmol/L (ref 135–145)
Total Bilirubin: 2.3 mg/dL — ABNORMAL HIGH (ref 0.3–1.2)
Total Protein: 7.7 g/dL (ref 6.5–8.1)

## 2019-04-05 LAB — RAPID URINE DRUG SCREEN, HOSP PERFORMED
Amphetamines: NOT DETECTED
Barbiturates: NOT DETECTED
Benzodiazepines: POSITIVE — AB
Cocaine: NOT DETECTED
Opiates: NOT DETECTED
Tetrahydrocannabinol: NOT DETECTED

## 2019-04-05 LAB — ETHANOL: Alcohol, Ethyl (B): <10 mg/dL (ref <10)

## 2019-04-05 MED ORDER — SODIUM CHLORIDE 0.9 % IV BOLUS
1000.0000 mL | Freq: Once | INTRAVENOUS | Status: AC
  Administered 2019-04-05: 10:00:00 1000 mL via INTRAVENOUS

## 2019-04-05 MED ORDER — ONDANSETRON HCL 4 MG/2ML IJ SOLN
4.0000 mg | Freq: Once | INTRAMUSCULAR | Status: AC
  Administered 2019-04-05: 10:00:00 4 mg via INTRAVENOUS
  Filled 2019-04-05: qty 2

## 2019-04-05 MED ORDER — LORAZEPAM 2 MG/ML IJ SOLN
1.0000 mg | Freq: Once | INTRAMUSCULAR | Status: AC
  Administered 2019-04-05: 1 mg via INTRAVENOUS
  Filled 2019-04-05: qty 1

## 2019-04-05 MED ORDER — CHLORDIAZEPOXIDE HCL 25 MG PO CAPS: ORAL_CAPSULE | ORAL | 0 refills | Status: DC

## 2019-04-05 NOTE — Discharge Instructions (Addendum)
It was our pleasure to provide your ER care today - we hope that you feel better.  Avoid alcohol use. Take librium as need for withdrawal symptoms - no driving if drinking alcohol or if taking librium.  See resource guide provided for options for alcohol abuse treatment/rehab.   Also follow up with primary care doctor in 1-2 weeks.  Return to ER if worse, new symptoms, fevers, persistent vomiting, new or severe pain, seizures, or other concern.   You were given medication in the ER - no driving for the next 6 hours.

## 2019-04-05 NOTE — ED Provider Notes (Signed)
[facility]-EMERGENCY DEPT Provider Note   CSN: [id number] Arrival date & time: 04/05/19  0908     History   Chief Complaint Chief Complaint  Patient presents with  . Alcohol Problem    HPI Jared Bass is a 48 y.o. male.     Patient with hx etoh abuse, states has been drinking heavily up until 2 days ago, and today with nausea, vomiting, and a few loose stools. Feels mildly shaky all over. Denies seizure. Denies hx seizures or DTs. No known ill contacts or bad food ingestion. No abdominal pain. Emesis is clear, not bloody or bilious. No syncope, trauma or fall. No headache. No chest pain or discomfort. No sob. No fever or chills.   The history is provided by the patient.  Alcohol Problem Pertinent negatives include no chest pain, no abdominal pain, no headaches and no shortness of breath.    Past Medical History:  Diagnosis Date  . Acute renal failure (HCC)   . Acute respiratory failure (HCC)    2017  . Acute respiratory failure with hypoxia (HCC)   . Acute saddle pulmonary embolism with acute cor pulmonale (HCC)   . Chronic neck pain   . Closed fracture of nasal bone   . DDD (degenerative disc disease), cervical 05/04/2015  . Faintness   . Hemoptysis   . History of atrial fibrillation   . History of head injury 05/04/2015  . History of tachycardia 05/04/2015  . Hypertension   . Hypotension   . Pulmonary emboli (HCC)    on elequis  . Pulmonary embolism (HCC)   . Right eye injury   . Saddle pulmonary embolus (HCC) 12/13/2015  . Syncope and collapse   . Tachycardia 12/20/2015    Patient Active Problem List   Diagnosis Date Noted  . Transaminitis 09/10/2017  . Alcohol use disorder, moderate, dependence (HCC) 07/11/2017  . GERD (gastroesophageal reflux disease) 03/12/2017  . Opioid use disorder, moderate, dependence (HCC) 01/22/2017  . Cervical spinal stenosis 05/04/2015  . Cervical foraminal stenosis (right side) 05/04/2015  . Generalized  anxiety disorder 05/04/2015    Past Surgical History:  Procedure Laterality Date  . neck fusion     c5-7  . TONSILLECTOMY          Home Medications    Prior to Admission medications   Medication Sig Start Date End Date Taking? Authorizing Provider  albuterol (PROVENTIL HFA;VENTOLIN HFA) 108 (90 Base) MCG/ACT inhaler Inhale 2 puffs into the lungs every 6 (six) hours as needed (cough). 04/22/18   [name], MD  apixaban (ELIQUIS) 5 MG TABS tablet Take 1 tablet (5 mg total) by mouth 2 (two) times daily. 10/15/16   Enedina Finner, MD  gabapentin (NEURONTIN) 300 MG capsule Take 1 capsule (300 mg total) by mouth 2 (two) times daily. 03/25/18   Gust Rung, DO  polyethylene glycol (MIRALAX / GLYCOLAX) packet Take 17 g by mouth daily. 07/30/17   Arnetha Courser, MD  propranolol (INDERAL) 10 MG tablet Take 10 mg by mouth 3 (three) times daily.  01/14/16   [provider]  ranitidine (ZANTAC) 150 MG tablet Take 1 tablet (150 mg total) by mouth at bedtime. 10/02/18 12/31/18  Inez Catalina, MD    Family History Family History  Problem Relation Age of Onset  . Hypertension Mother   . COPD Mother   . Mesothelioma Father   . COPD Father   . Peripheral vascular disease Brother  Clots in his arteries surgically removed    Social History Social History   Tobacco Use  . Smoking status: Never Smoker  . Smokeless tobacco: Never Used  Substance Use Topics  . Alcohol use: No    Alcohol/week: 0.0 standard drinks  . Drug use: No     Allergies   Tramadol and Other   Review of Systems Review of Systems  Constitutional: Negative for fever.  HENT: Negative for sore throat.   Eyes: Negative for pain.  Respiratory: Negative for cough and shortness of breath.   Cardiovascular: Negative for chest pain.  Gastrointestinal: Positive for nausea and vomiting. Negative for abdominal pain.  Endocrine: Negative for polyuria.  Genitourinary: Negative for flank pain.   Musculoskeletal: Negative for back pain and neck pain.  Skin: Negative for rash.  Neurological: Negative for syncope and headaches.  Hematological:       Takes eliquis. Denies any recent abnormal bruising or bleeding.   Psychiatric/Behavioral: Negative for confusion.     Physical Exam Updated Vital Signs BP (!) 142/84   Pulse 72   Temp 99.2 F (37.3 C) (Oral)   Resp 17   Ht 1.727 m (5\' 8" )   Wt 81.6 kg   SpO2 97%   BMI 27.37 kg/m   Physical Exam Vitals signs and nursing note reviewed.  Constitutional:      Appearance: Normal appearance. He is well-developed.  HENT:     Head: Atraumatic.     Nose: Nose normal.     Mouth/Throat:     Mouth: Mucous membranes are moist.     Pharynx: Oropharynx is clear.  Eyes:     General: No scleral icterus.    Conjunctiva/sclera: Conjunctivae normal.     Comments: Right pupil irregular (pt notes at his baseline, and due to prior paint ball injury).   Neck:     Musculoskeletal: Normal range of motion and neck supple. No neck rigidity.     Trachea: No tracheal deviation.  Cardiovascular:     Rate and Rhythm: Normal rate and regular rhythm.     Pulses: Normal pulses.     Heart sounds: Normal heart sounds. No murmur. No friction rub. No gallop.   Pulmonary:     Effort: Pulmonary effort is normal. No accessory muscle usage or respiratory distress.     Breath sounds: Normal breath sounds.  Abdominal:     General: Bowel sounds are normal. There is no distension.     Palpations: Abdomen is soft.     Tenderness: There is no abdominal tenderness. There is no guarding.  Genitourinary:    Comments: No cva tenderness. Musculoskeletal:        General: No swelling or tenderness.     Right lower leg: No edema.     Left lower leg: No edema.     Comments: CTLS spine, non tender, aligned, no step off.   Skin:    General: Skin is warm and dry.     Findings: No rash.  Neurological:     Mental Status: He is alert.     Comments: Alert, speech  clear. No gross tremor. Motor intact bil, stre 5/5. sens grossly intact.   Psychiatric:        Mood and Affect: Mood normal.      ED Treatments / Results  Labs (all labs ordered are listed, but only abnormal results are displayed) Results for orders placed or performed during the hospital encounter of 04/05/19  CBC  Result Value Ref Range  WBC 5.4 4.0 - 10.5 K/uL   RBC 4.69 4.22 - 5.81 MIL/uL   Hemoglobin 15.1 13.0 - 17.0 g/dL   HCT 27.7 82.4 - 23.5 %   MCV 95.9 80.0 - 100.0 fL   MCH 32.2 26.0 - 34.0 pg   MCHC 33.6 30.0 - 36.0 g/dL   RDW 36.1 44.3 - 15.4 %   Platelets 85 (L) 150 - 400 K/uL   nRBC 0.0 0.0 - 0.2 %  Comprehensive metabolic panel  Result Value Ref Range   Sodium 139 135 - 145 mmol/L   Potassium 3.9 3.5 - 5.1 mmol/L   Chloride 103 98 - 111 mmol/L   CO2 26 22 - 32 mmol/L   Glucose, Bld 140 (H) 70 - 99 mg/dL   BUN 8 6 - 20 mg/dL   Creatinine, Ser 0.08 0.61 - 1.24 mg/dL   Calcium 9.2 8.9 - 67.6 mg/dL   Total Protein 7.7 6.5 - 8.1 g/dL   Albumin 4.7 3.5 - 5.0 g/dL   AST 195 (H) 15 - 41 U/L   ALT 39 0 - 44 U/L   Alkaline Phosphatase 69 38 - 126 U/L   Total Bilirubin 2.3 (H) 0.3 - 1.2 mg/dL   GFR calc non Af Amer >60 >60 mL/min   GFR calc Af Amer >60 >60 mL/min   Anion gap 10 5 - 15  Ethanol  Result Value Ref Range   Alcohol, Ethyl (B) <10 <10 mg/dL  Rapid urine drug screen (hospital performed)  Result Value Ref Range   Opiates NONE DETECTED NONE DETECTED   Cocaine NONE DETECTED NONE DETECTED   Benzodiazepines POSITIVE (A) NONE DETECTED   Amphetamines NONE DETECTED NONE DETECTED   Tetrahydrocannabinol NONE DETECTED NONE DETECTED   Barbiturates NONE DETECTED NONE DETECTED    EKG None  Radiology No results found.  Procedures Procedures (including critical care time)  Medications Ordered in ED Medications  sodium chloride 0.9 % bolus 1,000 mL (has no administration in time range)  ondansetron (ZOFRAN) injection 4 mg (has no administration in time  range)     Initial Impression / Assessment and Plan / ED Course  I have reviewed the triage vital signs and the nursing notes.  Pertinent labs & imaging results that were available during my care of the patient were reviewed by me and considered in my medical decision making (see chart for details).  Patient vomiting into container - clear, not bloody.  Iv ns bolus. zofran iv.   Labs sent.   Reviewed nursing notes and prior charts for additional history.   Labs reviewed by me - hgb normal, lytes normal.  Recheck - pt feels improved, requesting food/drink - provided to patient.   On recheck is tolerating po. No recurrent nausea or vomiting. abd soft nt. No tremor or shakes. Pt currently appears stable for d/c.   Will provide resource guide for outpt etoh rehab. rx librium.   Return precautions provided.     Final Clinical Impressions(s) / ED Diagnoses   Final diagnoses:  None    ED Discharge Orders    None       Cathren Laine, MD 04/05/19 1216

## 2019-04-05 NOTE — ED Notes (Signed)
Pt reports drinking 1/5 of liquor 3x a week over the past 2 years. Last drink was Friday, Pt requesting help with detox/rehab. Slight tremor noted.

## 2019-04-05 NOTE — ED Notes (Signed)
Pt d/c home per MD order. Discharge summary reviewed, pt verbalizes understanding. Ambulatory off unit. Reports discharge ride home.  

## 2019-04-05 NOTE — ED Triage Notes (Signed)
Pt to ED from home via [facility] pt wanting detox from alcohol, Pt last drink on Friday. No medications given by EMS.

## 2019-04-05 NOTE — ED Notes (Signed)
Pt provided lunch tray.

## 2019-04-06 ENCOUNTER — Other Ambulatory Visit: Payer: Self-pay

## 2019-04-06 MED ORDER — ZUBSOLV 5.7-1.4 MG SL SUBL: 1.0000 | SUBLINGUAL_TABLET | Freq: Two times a day (BID) | SUBLINGUAL | 0 refills | Status: DC

## 2019-04-06 NOTE — Telephone Encounter (Signed)
Called and spoke with Jared Bass.  We have not seen him in OUD clinic in quite a while.  Confirmed identity with DOB and name.   He notes that he has been having trouble with alcohol and has been in and out of rehab.  He was last seen in the ED yesterday for ETOH withdrawal and started on a librium taper.  He reports last Zubsolv use 3 weeks ago and in the interim he has taken some oxycodone.  He prefers to be on the buprenorphine and wants to avoid withdrawal.  I advised him that he would need to present for an appointment in 1-2 weeks, or we would not be able to provide further refills and he expressed understanding.   Bonnita Nasuti or Lauren - Can you please put [name] on the schedule in 1-2 weeks and call him to inform him?   2 week supply of Zubsolv at previous dose sent to pharmacy.   Gilles Chiquito, MD

## 2019-04-06 NOTE — Telephone Encounter (Signed)
Pt called to make sure zubsolv was sent in, reassured him Made appt 10/13 at 1015

## 2019-04-06 NOTE — Telephone Encounter (Signed)
Requesting to speak with a nurse about getting a refill on suboxone. Please call pt back.

## 2019-04-06 NOTE — Telephone Encounter (Signed)
Pt called back with a number to call [phone].

## 2019-04-08 DIAGNOSIS — H25011 Cortical age-related cataract, right eye: Secondary | ICD-10-CM | POA: Diagnosis not present

## 2019-04-08 DIAGNOSIS — H5213 Myopia, bilateral: Secondary | ICD-10-CM | POA: Diagnosis not present

## 2019-04-08 DIAGNOSIS — H442E2 Degenerative myopia with other maculopathy, left eye: Secondary | ICD-10-CM | POA: Diagnosis not present

## 2019-04-08 DIAGNOSIS — H25041 Posterior subcapsular polar age-related cataract, right eye: Secondary | ICD-10-CM | POA: Diagnosis not present

## 2019-04-08 DIAGNOSIS — H52223 Regular astigmatism, bilateral: Secondary | ICD-10-CM | POA: Diagnosis not present

## 2019-04-08 DIAGNOSIS — H2511 Age-related nuclear cataract, right eye: Secondary | ICD-10-CM | POA: Diagnosis not present

## 2019-04-08 DIAGNOSIS — Q141 Congenital malformation of retina: Secondary | ICD-10-CM | POA: Diagnosis not present

## 2019-04-14 ENCOUNTER — Ambulatory Visit (INDEPENDENT_AMBULATORY_CARE_PROVIDER_SITE_OTHER): Payer: Medicare Other | Admitting: Internal Medicine

## 2019-04-14 ENCOUNTER — Other Ambulatory Visit: Payer: Self-pay

## 2019-04-14 VITALS — BP 107/62 | HR 66 | Temp 98.9°F | Ht 68.0 in | Wt 185.3 lb

## 2019-04-14 DIAGNOSIS — F411 Generalized anxiety disorder: Secondary | ICD-10-CM

## 2019-04-14 DIAGNOSIS — F909 Attention-deficit hyperactivity disorder, unspecified type: Secondary | ICD-10-CM | POA: Diagnosis not present

## 2019-04-14 DIAGNOSIS — F1020 Alcohol dependence, uncomplicated: Secondary | ICD-10-CM

## 2019-04-14 DIAGNOSIS — Z79899 Other long term (current) drug therapy: Secondary | ICD-10-CM | POA: Diagnosis not present

## 2019-04-14 DIAGNOSIS — F1120 Opioid dependence, uncomplicated: Secondary | ICD-10-CM

## 2019-04-14 MED ORDER — ZUBSOLV 5.7-1.4 MG SL SUBL: 1.0000 | SUBLINGUAL_TABLET | Freq: Three times a day (TID) | SUBLINGUAL | 0 refills | Status: DC

## 2019-04-14 MED ORDER — ALBUTEROL SULFATE HFA 108 (90 BASE) MCG/ACT IN AERS: 2.0000 | INHALATION_SPRAY | Freq: Four times a day (QID) | RESPIRATORY_TRACT | 0 refills | Status: DC | PRN

## 2019-04-14 NOTE — Assessment & Plan Note (Signed)
Improved.  He is going to [place].  He has decreased his ETOH intake significantly, which I congratulated him on.

## 2019-04-14 NOTE — Progress Notes (Signed)
   04/14/2019  Jared Bass presents for follow up of opioid use disorder I have reviewed the prior induction visit, follow up visits, and telephone encounters relevant to opiate use disorder (OUD) treatment.   Current daily dose: Zubsolv 5.7mg /1.4mg  BID  Date of Induction: 01/22/17  Current follow up interval, in weeks: 4  The patient has been adherent with the buprenorphine for OUD contract.   Last UDS Result: + for BZD  HPI: Jared Bass is a 48 year old man with PMH of OUD and AUD who presents for treatment of OUD with buprenorphine.  Jared Bass reports that he is doing well.  He has been off of ETOH for 4 months.  He has been going to [place], but the meetings have gone virtual during [place] and this has not worked well for him.  He has had one slip up with taking a percocet and one evening of drinking alcohol in the last week.  He is committed to being clean.  I provided him with new AA listings.  He is asking to increase his Zubsolv as he has had an increase in neck pain.  He feels that the Zubsolv helps with pain.  He is amenable to weaning this down after a week.  He is also asking for a referral to psychiatry to help with anxiety and ADHD.   Exam:   Vitals:   04/14/19 1014 04/14/19 1017  BP:  107/62  Pulse:  66  Temp:  98.9 F (37.2 C)  TempSrc:  Oral  SpO2:  99%  Weight: 185 lb 4.8 oz (84.1 kg)   Height: 5\' 8"  (1.727 m)     General: Pleasant, no distress Neck: Anterior neck scar from previous surgeries Psych: Pleasant, normal mood.   Assessment/Plan:  See Problem Based Charting in the Encounters Tab     Sid Falcon, MD  04/14/2019  10:35 AM

## 2019-04-14 NOTE — Assessment & Plan Note (Signed)
Increase Zubsolv to TID dosing for the pain.  UDS today.  PDMP reviewed and appropriate.   Plan Zubsolv TID Follow up in 4 weeks UDS today.

## 2019-04-14 NOTE — Patient Instructions (Signed)
Jared Bass - -  Thank you for coming in to see me today.   For your Zubsolv, please start taking 3 times daily to see if this helps with your pain.  We will discuss weaning at next visit.   Please come back in 4 weeks.

## 2019-04-14 NOTE — Assessment & Plan Note (Signed)
Psychiatry referral today.  He will need evaluation for GAD and ADHD.

## 2019-04-17 LAB — TOXASSURE SELECT,+ANTIDEPR,UR

## 2019-04-29 DIAGNOSIS — H27111 Subluxation of lens, right eye: Secondary | ICD-10-CM | POA: Diagnosis not present

## 2019-04-29 DIAGNOSIS — Q141 Congenital malformation of retina: Secondary | ICD-10-CM | POA: Diagnosis not present

## 2019-04-29 DIAGNOSIS — H15832 Staphyloma posticum, left eye: Secondary | ICD-10-CM | POA: Diagnosis not present

## 2019-04-29 DIAGNOSIS — H442D2 Degenerative myopia with foveoschisis, left eye: Secondary | ICD-10-CM | POA: Diagnosis not present

## 2019-05-04 ENCOUNTER — Other Ambulatory Visit: Payer: Self-pay | Admitting: Internal Medicine

## 2019-05-12 ENCOUNTER — Telehealth: Payer: Self-pay | Admitting: Internal Medicine

## 2019-05-12 NOTE — Telephone Encounter (Signed)
Attempted to call him. No response.

## 2019-05-12 NOTE — Telephone Encounter (Signed)
Dr Myrtie Hawk attempted to reach Jared Bass multiple time to see how he is doing and to switch his missed appointment today to a tele visit. No response. MD left him a voice message to give [place] a call.Regenia Skeeter, Darlene Cassady11/10/202011:56 AM

## 2019-05-12 NOTE — Progress Notes (Deleted)
Attempted to reach Jared Bass multiple time to see how he is doing and to switch his missed appointment today to a tele visit. No response. I left him a voice message to give [place] a call.

## 2019-05-21 ENCOUNTER — Other Ambulatory Visit: Payer: Self-pay | Admitting: Student in an Organized Health Care Education/Training Program

## 2019-05-21 NOTE — Telephone Encounter (Signed)
Refill Request-Pt has sch an appt for the OUD clinic on 06/02/2019.   Buprenorphine HCl-Naloxone HCl (ZUBSOLV) 5.7-1.4 MG SUBL  [id number] - [place],  - [address] AT [place]

## 2019-05-22 MED ORDER — ZUBSOLV 5.7-1.4 MG SL SUBL: 1.0000 | SUBLINGUAL_TABLET | Freq: Three times a day (TID) | SUBLINGUAL | 0 refills | Status: DC

## 2019-06-02 ENCOUNTER — Ambulatory Visit: Payer: Medicare Other

## 2019-06-23 IMAGING — CR DG CHEST 2V
2 series · 2 of 2 positions shown · non-contrast
Comparison: June 30, 2017

CLINICAL DATA: Upper respiratory infection.  Cough.

EXAM:
CHEST  2 VIEW

[chest pa]
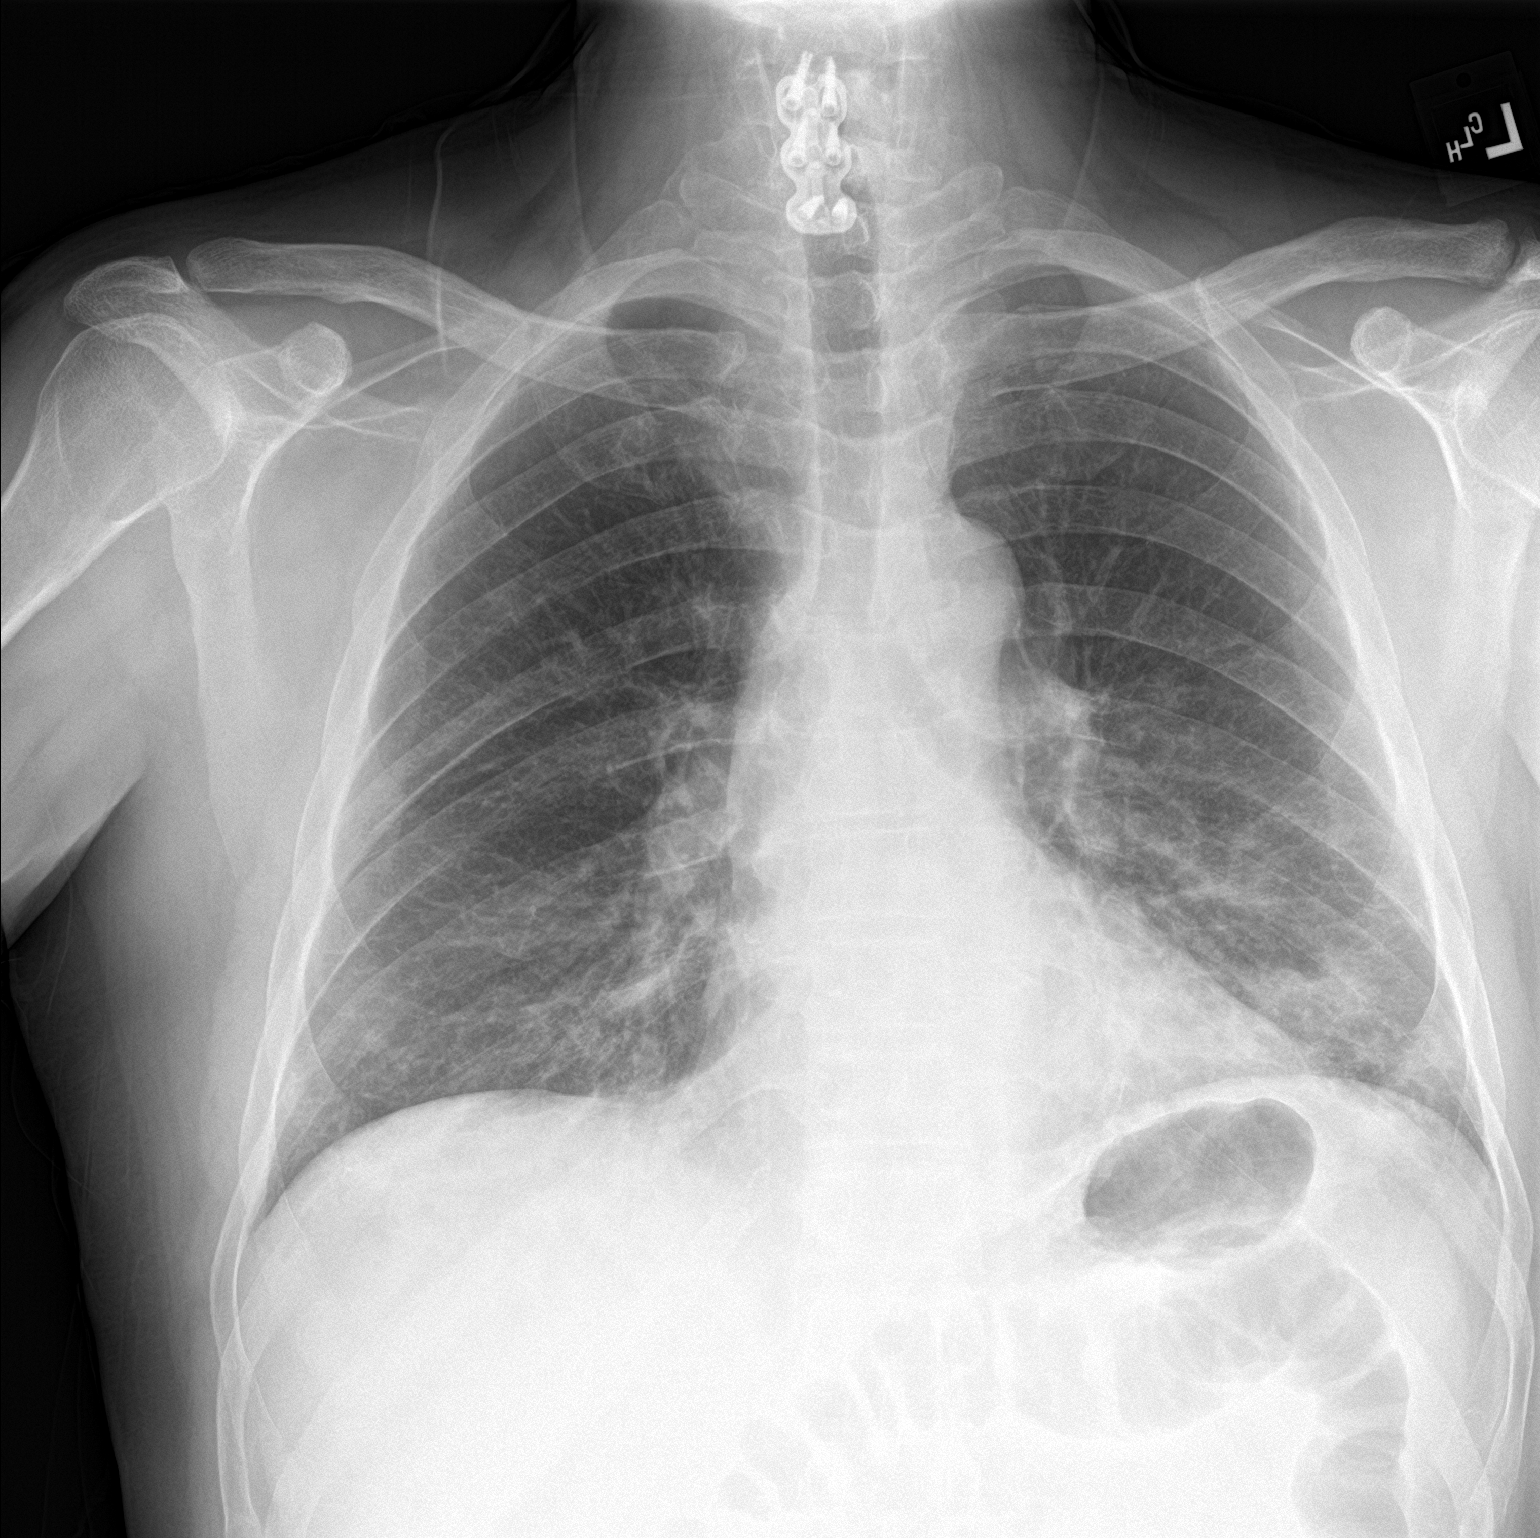

[chest lat]
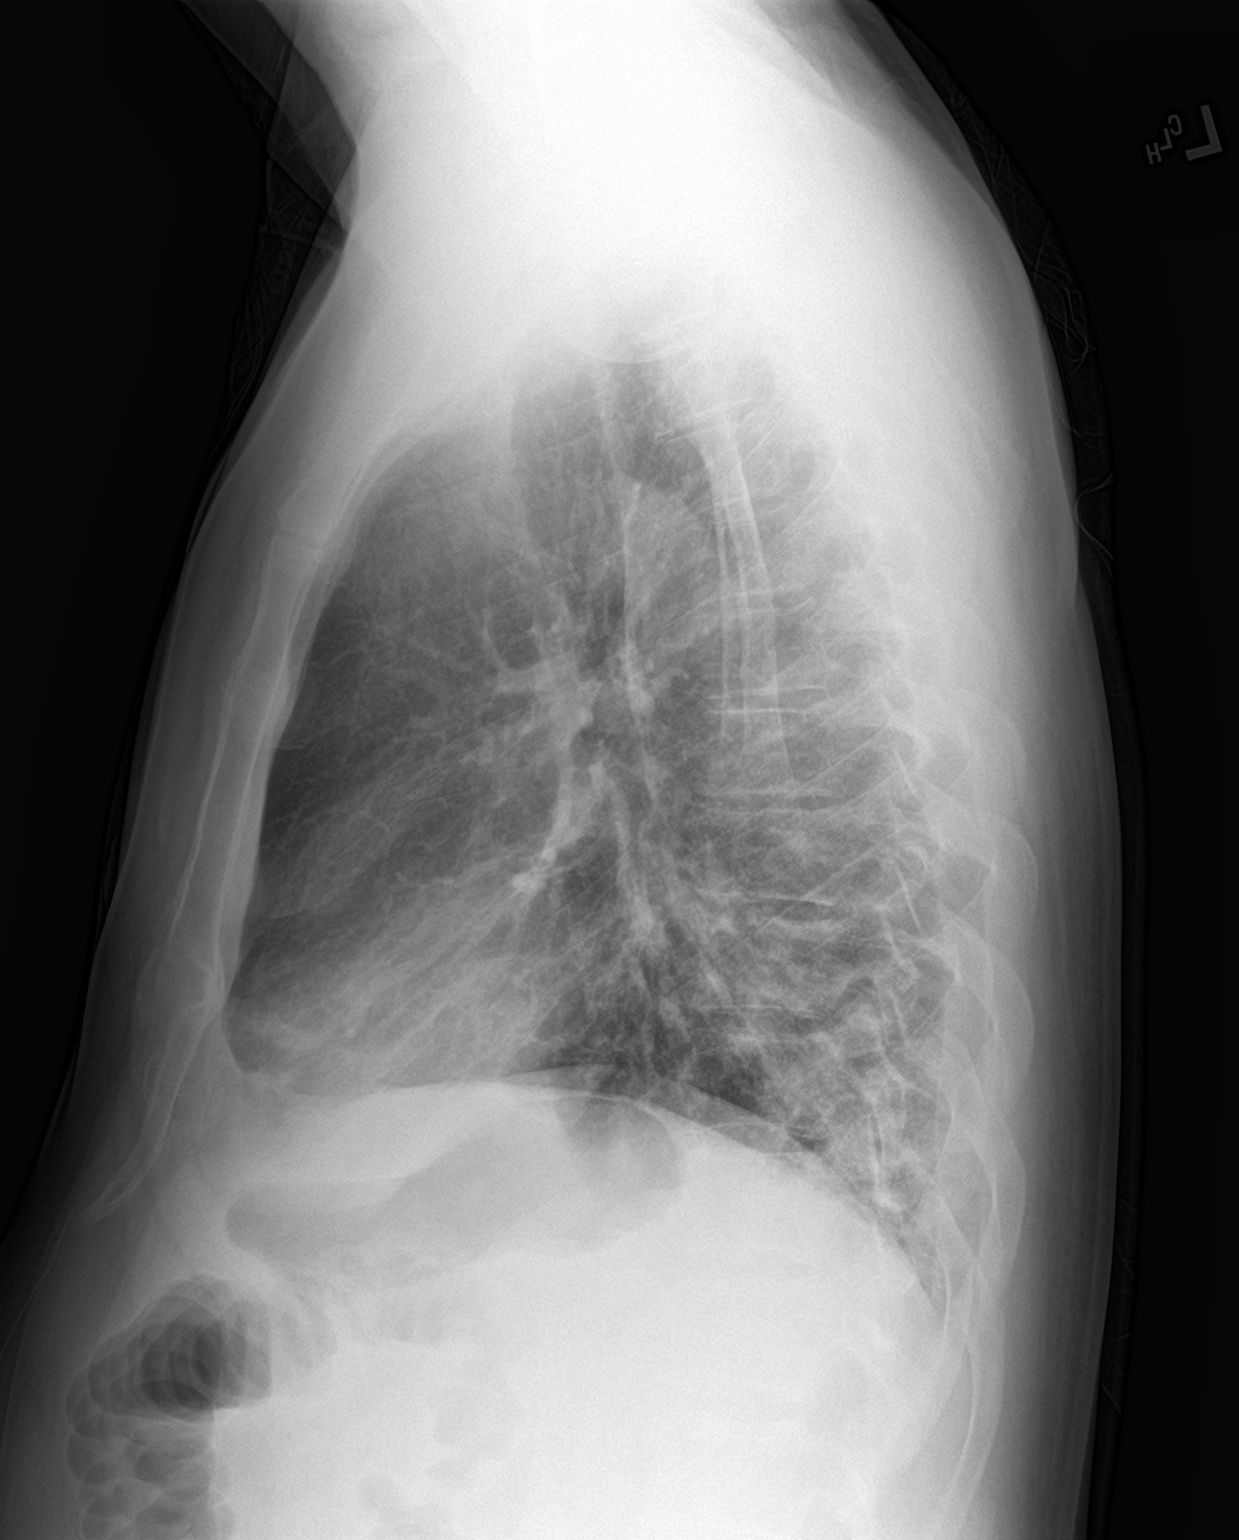

[2 of 2 positions shown; findings below may reference images not displayed]

FINDINGS: Worsening patchy opacity in the left base. The heart, hila, and
mediastinum are normal. No pneumothorax. The opacity projects over
the heart on the lateral view suggesting lingular involvement, not
seen on the comparison.
IMPRESSION: Worsening infiltrate in the left base and lingula. The findings are
most consistent with worsening pneumonia.

## 2020-01-21 ENCOUNTER — Telehealth: Payer: Self-pay

## 2020-01-26 ENCOUNTER — Encounter (HOSPITAL_COMMUNITY): Payer: Self-pay | Admitting: Emergency Medicine

## 2020-01-26 ENCOUNTER — Emergency Department (HOSPITAL_COMMUNITY)
Admission: EM | Admit: 2020-01-26 | Discharge: 2020-01-26 | Disposition: A | Discharge status: Home / Self Care | Payer: Medicare Other | Source: Home / Self Care

## 2020-01-26 DIAGNOSIS — R188 Other ascites: Secondary | ICD-10-CM | POA: Diagnosis not present

## 2020-01-26 DIAGNOSIS — R109 Unspecified abdominal pain: Secondary | ICD-10-CM | POA: Insufficient documentation

## 2020-01-26 DIAGNOSIS — R079 Chest pain, unspecified: Secondary | ICD-10-CM | POA: Diagnosis not present

## 2020-01-26 DIAGNOSIS — K7031 Alcoholic cirrhosis of liver with ascites: Secondary | ICD-10-CM | POA: Diagnosis not present

## 2020-01-26 DIAGNOSIS — R0902 Hypoxemia: Secondary | ICD-10-CM | POA: Diagnosis not present

## 2020-01-26 DIAGNOSIS — J984 Other disorders of lung: Secondary | ICD-10-CM | POA: Diagnosis not present

## 2020-01-26 DIAGNOSIS — F1010 Alcohol abuse, uncomplicated: Secondary | ICD-10-CM | POA: Diagnosis not present

## 2020-01-26 DIAGNOSIS — R0602 Shortness of breath: Secondary | ICD-10-CM | POA: Diagnosis not present

## 2020-01-26 DIAGNOSIS — R1084 Generalized abdominal pain: Secondary | ICD-10-CM | POA: Diagnosis not present

## 2020-01-26 DIAGNOSIS — R0789 Other chest pain: Secondary | ICD-10-CM | POA: Diagnosis not present

## 2020-01-26 DIAGNOSIS — Z5321 Procedure and treatment not carried out due to patient leaving prior to being seen by health care provider: Secondary | ICD-10-CM | POA: Insufficient documentation

## 2020-01-26 LAB — COMPREHENSIVE METABOLIC PANEL
ALT: 67 U/L — ABNORMAL HIGH (ref 0–44)
AST: 182 U/L — ABNORMAL HIGH (ref 15–41)
Albumin: 3.2 g/dL — ABNORMAL LOW (ref 3.5–5.0)
Alkaline Phosphatase: 77 U/L (ref 38–126)
Anion gap: 7 (ref 5–15)
BUN: 5 mg/dL — ABNORMAL LOW (ref 6–20)
CO2: 27 mmol/L (ref 22–32)
Calcium: 8.3 mg/dL — ABNORMAL LOW (ref 8.9–10.3)
Chloride: 107 mmol/L (ref 98–111)
Creatinine, Ser: 0.76 mg/dL (ref 0.61–1.24)
GFR calc Af Amer: >60 mL/min (ref >60)
GFR calc non Af Amer: >60 mL/min (ref >60)
Glucose, Bld: 121 mg/dL — ABNORMAL HIGH (ref 70–99)
Potassium: 3.5 mmol/L (ref 3.5–5.1)
Sodium: 141 mmol/L (ref 135–145)
Total Bilirubin: 6.5 mg/dL — ABNORMAL HIGH (ref 0.3–1.2)
Total Protein: 6.6 g/dL (ref 6.5–8.1)

## 2020-01-26 LAB — CBC
HCT: 41.1 % (ref 39.0–52.0)
Hemoglobin: 13.8 g/dL (ref 13.0–17.0)
MCH: 32.9 pg (ref 26.0–34.0)
MCHC: 33.6 g/dL (ref 30.0–36.0)
MCV: 98.1 fL (ref 80.0–100.0)
Platelets: 57 10*3/uL — ABNORMAL LOW (ref 150–400)
RBC: 4.19 MIL/uL — ABNORMAL LOW (ref 4.22–5.81)
RDW: 17.3 % — ABNORMAL HIGH (ref 11.5–15.5)
WBC: 4 10*3/uL (ref 4.0–10.5)
nRBC: 0 % (ref 0.0–0.2)

## 2020-01-26 LAB — LIPASE, BLOOD: Lipase: 55 U/L — ABNORMAL HIGH (ref 11–51)

## 2020-01-26 LAB — ETHANOL: Alcohol, Ethyl (B): 281 mg/dL — ABNORMAL HIGH (ref <10)

## 2020-01-26 MED ORDER — SODIUM CHLORIDE 0.9% FLUSH: 3.0000 mL | Freq: Once | INTRAVENOUS | Status: DC

## 2020-01-26 NOTE — ED Notes (Signed)
Called pt 3 times an no one answered

## 2020-01-26 NOTE — ED Triage Notes (Signed)
Pt arrives via [facility] with c/o of abd pain and bloating over the last couple months. Pt reports chronic alcoholism has had "4 shots today".

## 2020-01-27 ENCOUNTER — Other Ambulatory Visit: Payer: Self-pay

## 2020-01-27 ENCOUNTER — Emergency Department (HOSPITAL_COMMUNITY): Payer: Medicare Other

## 2020-01-27 ENCOUNTER — Inpatient Hospital Stay (HOSPITAL_COMMUNITY): Payer: Medicare Other

## 2020-01-27 ENCOUNTER — Encounter (HOSPITAL_COMMUNITY): Payer: Self-pay | Admitting: *Deleted

## 2020-01-27 ENCOUNTER — Inpatient Hospital Stay (HOSPITAL_COMMUNITY)
Admission: EM | Admit: 2020-01-27 | Discharge: 2020-01-31 | DRG: 433 | Disposition: A | Discharge status: Home / Self Care | Payer: Medicare Other | Attending: Student in an Organized Health Care Education/Training Program | Admitting: Student in an Organized Health Care Education/Training Program

## 2020-01-27 ENCOUNTER — Ambulatory Visit (HOSPITAL_COMMUNITY)
Admission: EM | Admit: 2020-01-27 | Discharge: 2020-01-27 | Disposition: A | Discharge status: Home / Self Care | Payer: Medicare Other

## 2020-01-27 DIAGNOSIS — D696 Thrombocytopenia, unspecified: Secondary | ICD-10-CM | POA: Diagnosis present

## 2020-01-27 DIAGNOSIS — Z885 Allergy status to narcotic agent status: Secondary | ICD-10-CM

## 2020-01-27 DIAGNOSIS — I8510 Secondary esophageal varices without bleeding: Secondary | ICD-10-CM | POA: Diagnosis not present

## 2020-01-27 DIAGNOSIS — K7460 Unspecified cirrhosis of liver: Secondary | ICD-10-CM | POA: Diagnosis not present

## 2020-01-27 DIAGNOSIS — F1010 Alcohol abuse, uncomplicated: Secondary | ICD-10-CM | POA: Diagnosis present

## 2020-01-27 DIAGNOSIS — Z20822 Contact with and (suspected) exposure to covid-19: Secondary | ICD-10-CM | POA: Diagnosis present

## 2020-01-27 DIAGNOSIS — F1121 Opioid dependence, in remission: Secondary | ICD-10-CM | POA: Diagnosis present

## 2020-01-27 DIAGNOSIS — K7031 Alcoholic cirrhosis of liver with ascites: Principal | ICD-10-CM | POA: Diagnosis present

## 2020-01-27 DIAGNOSIS — R17 Unspecified jaundice: Secondary | ICD-10-CM

## 2020-01-27 DIAGNOSIS — R188 Other ascites: Secondary | ICD-10-CM

## 2020-01-27 DIAGNOSIS — K7011 Alcoholic hepatitis with ascites: Secondary | ICD-10-CM | POA: Diagnosis present

## 2020-01-27 DIAGNOSIS — K3189 Other diseases of stomach and duodenum: Secondary | ICD-10-CM | POA: Diagnosis present

## 2020-01-27 DIAGNOSIS — K7290 Hepatic failure, unspecified without coma: Secondary | ICD-10-CM | POA: Diagnosis present

## 2020-01-27 DIAGNOSIS — Z86711 Personal history of pulmonary embolism: Secondary | ICD-10-CM | POA: Diagnosis not present

## 2020-01-27 DIAGNOSIS — Z79899 Other long term (current) drug therapy: Secondary | ICD-10-CM

## 2020-01-27 DIAGNOSIS — G8929 Other chronic pain: Secondary | ICD-10-CM | POA: Diagnosis present

## 2020-01-27 DIAGNOSIS — K766 Portal hypertension: Secondary | ICD-10-CM | POA: Diagnosis present

## 2020-01-27 DIAGNOSIS — K838 Other specified diseases of biliary tract: Secondary | ICD-10-CM

## 2020-01-27 DIAGNOSIS — K2970 Gastritis, unspecified, without bleeding: Secondary | ICD-10-CM | POA: Diagnosis present

## 2020-01-27 DIAGNOSIS — J984 Other disorders of lung: Secondary | ICD-10-CM | POA: Diagnosis not present

## 2020-01-27 LAB — HEPATIC FUNCTION PANEL
ALT: 66 U/L — ABNORMAL HIGH (ref 0–44)
AST: 173 U/L — ABNORMAL HIGH (ref 15–41)
Albumin: 3.2 g/dL — ABNORMAL LOW (ref 3.5–5.0)
Alkaline Phosphatase: 83 U/L (ref 38–126)
Bilirubin, Direct: 2 mg/dL — ABNORMAL HIGH (ref 0.0–0.2)
Indirect Bilirubin: 5 mg/dL — ABNORMAL HIGH (ref 0.3–0.9)
Total Bilirubin: 7 mg/dL — ABNORMAL HIGH (ref 0.3–1.2)
Total Protein: 6.4 g/dL — ABNORMAL LOW (ref 6.5–8.1)

## 2020-01-27 LAB — HIV ANTIBODY (ROUTINE TESTING W REFLEX): HIV Screen 4th Generation wRfx: NONREACTIVE

## 2020-01-27 LAB — CBC
HCT: 40.4 % (ref 39.0–52.0)
Hemoglobin: 13.7 g/dL (ref 13.0–17.0)
MCH: 33.4 pg (ref 26.0–34.0)
MCHC: 33.9 g/dL (ref 30.0–36.0)
MCV: 98.5 fL (ref 80.0–100.0)
Platelets: 57 10*3/uL — ABNORMAL LOW (ref 150–400)
RBC: 4.1 MIL/uL — ABNORMAL LOW (ref 4.22–5.81)
RDW: 17.3 % — ABNORMAL HIGH (ref 11.5–15.5)
WBC: 3.9 10*3/uL — ABNORMAL LOW (ref 4.0–10.5)
nRBC: 0 % (ref 0.0–0.2)

## 2020-01-27 LAB — TROPONIN I (HIGH SENSITIVITY)
Troponin I (High Sensitivity): 5 ng/L (ref <18)
Troponin I (High Sensitivity): 5 ng/L (ref <18)

## 2020-01-27 LAB — BASIC METABOLIC PANEL
Anion gap: 11 (ref 5–15)
BUN: 7 mg/dL (ref 6–20)
CO2: 27 mmol/L (ref 22–32)
Calcium: 8.8 mg/dL — ABNORMAL LOW (ref 8.9–10.3)
Chloride: 108 mmol/L (ref 98–111)
Creatinine, Ser: 0.82 mg/dL (ref 0.61–1.24)
GFR calc Af Amer: >60 mL/min (ref >60)
GFR calc non Af Amer: >60 mL/min (ref >60)
Glucose, Bld: 126 mg/dL — ABNORMAL HIGH (ref 70–99)
Potassium: 3.7 mmol/L (ref 3.5–5.1)
Sodium: 146 mmol/L — ABNORMAL HIGH (ref 135–145)

## 2020-01-27 LAB — URINALYSIS, ROUTINE W REFLEX MICROSCOPIC
Glucose, UA: NEGATIVE mg/dL
Hgb urine dipstick: NEGATIVE
Ketones, ur: NEGATIVE mg/dL
Leukocytes,Ua: NEGATIVE
Nitrite: NEGATIVE
Protein, ur: NEGATIVE mg/dL
Specific Gravity, Urine: 1.017 (ref 1.005–1.030)
pH: 8 (ref 5.0–8.0)

## 2020-01-27 LAB — LIPASE, BLOOD: Lipase: 57 U/L — ABNORMAL HIGH (ref 11–51)

## 2020-01-27 LAB — SARS CORONAVIRUS 2 BY RT PCR (DIASORIN): SARS Coronavirus 2: NEGATIVE

## 2020-01-27 LAB — PROTIME-INR
INR: 1.9 — ABNORMAL HIGH (ref 0.8–1.2)
Prothrombin Time: 20.9 seconds — ABNORMAL HIGH (ref 11.4–15.2)

## 2020-01-27 LAB — BRAIN NATRIURETIC PEPTIDE: B Natriuretic Peptide: 55.1 pg/mL (ref 0.0–100.0)

## 2020-01-27 LAB — MAGNESIUM: Magnesium: 1.7 mg/dL (ref 1.7–2.4)

## 2020-01-27 LAB — PHOSPHORUS: Phosphorus: 3.7 mg/dL (ref 2.5–4.6)

## 2020-01-27 MED ORDER — LORAZEPAM 2 MG/ML IJ SOLN: 1.0000 mg | INTRAMUSCULAR | Status: DC | PRN

## 2020-01-27 MED ORDER — FOLIC ACID 1 MG PO TABS
1.0000 mg | ORAL_TABLET | Freq: Every day | ORAL | Status: DC
  Administered 2020-01-28 – 2020-01-31 (×3): 1 mg via ORAL
  Filled 2020-01-27 (×3): qty 1

## 2020-01-27 MED ORDER — FUROSEMIDE 40 MG PO TABS
40.0000 mg | ORAL_TABLET | Freq: Every day | ORAL | Status: DC
  Administered 2020-01-27 – 2020-01-28 (×2): 40 mg via ORAL
  Filled 2020-01-27 (×2): qty 1

## 2020-01-27 MED ORDER — ADULT MULTIVITAMIN W/MINERALS CH
1.0000 | ORAL_TABLET | Freq: Every day | ORAL | Status: DC
  Administered 2020-01-28 – 2020-01-31 (×3): 1 via ORAL
  Filled 2020-01-27 (×3): qty 1

## 2020-01-27 MED ORDER — THIAMINE HCL 100 MG PO TABS
100.0000 mg | ORAL_TABLET | Freq: Every day | ORAL | Status: DC
  Administered 2020-01-28 – 2020-01-31 (×3): 100 mg via ORAL
  Filled 2020-01-27 (×3): qty 1

## 2020-01-27 MED ORDER — GADOBUTROL 1 MMOL/ML IV SOLN
8.6000 mL | Freq: Once | INTRAVENOUS | Status: AC | PRN
  Administered 2020-01-27: 8.6 mL via INTRAVENOUS

## 2020-01-27 MED ORDER — LORAZEPAM 1 MG PO TABS: 1.0000 mg | ORAL_TABLET | ORAL | Status: DC | PRN

## 2020-01-27 MED ORDER — SPIRONOLACTONE 25 MG PO TABS
100.0000 mg | ORAL_TABLET | Freq: Every day | ORAL | Status: DC
  Administered 2020-01-27 – 2020-01-28 (×2): 100 mg via ORAL
  Filled 2020-01-27 (×2): qty 4

## 2020-01-27 MED ORDER — THIAMINE HCL 100 MG/ML IJ SOLN: 100.0000 mg | Freq: Every day | INTRAMUSCULAR | Status: DC

## 2020-01-27 MED ORDER — SODIUM CHLORIDE 0.9% FLUSH: 3.0000 mL | Freq: Once | INTRAVENOUS | Status: DC

## 2020-01-27 MED ORDER — PROPRANOLOL HCL 10 MG PO TABS
10.0000 mg | ORAL_TABLET | Freq: Three times a day (TID) | ORAL | Status: DC
  Administered 2020-01-27 – 2020-01-29 (×5): 10 mg via ORAL
  Filled 2020-01-27 (×8): qty 1

## 2020-01-27 NOTE — Consult Note (Signed)
[facility] Consult: 3:55 PM 01/27/2020  LOS: 0 days    Referring Provider: Dr Stevie Kern  Primary Care Physician:  Sherrie Mustache, MD in [facility] Primary Gastroenterologist:  Gentry Fitz     Reason for Consultation:  Cirrhosis, elevated LFTs, dilated CBD   HPI: Jared Bass is a 49 y.o. male.  Past medical history alcohol abuse, chronic alcoholism.  Saddle pulmonary embolus 12/2015, cor pulmonale, severe pulmonary hypertension at time of PE.  Atrial fibrillation.  On Eliquis.  Steatosis of the liver seen on CTAP in 07/2016.  Acute hepatitis serologies in 11/2017 all nonreactive.  Degenerative spine disease, s/p c spine surgery, chronic pain.  Thrombocytopenia with platelets of 80 5K in 04/2019.  Community-acquired pneumonia 07/2017.  Presented to the ED via EMS complaining of 2 d RUQ abdominal pain bloating, LE edema yesterday afternoon, looks as though he left the ED without being seen. Presented back to the ED at 9 AM this morning with continued bloating which was causing shortness of breath.  Reports 2 years of blood in his stool intermittently, bright red.  No black stools.  Has noted yellowing of his eyes.  Denies nausea, vomiting, constipation, diarrhea.  Last alcohol was 4 shots of liquor at 730 this morning.  Admits to drinking 1/5 of liquor daily.  Abdominal ultrasound showing dilated gallbladder, no gallstones.  Dilated CBD, 9.4 mm diameter.  Diffusely irregular liver consistent with cirrhosis.  Moderate perihepatic and abdominal ascites.  8 mm cyst in the left lobe of liver.  Portal vein patent with normal directional flow.    Blood alcohol level 281. T bili 7.  Alkaline phosphatase 182.  AST/ALT 182/67. Sodium 146.  BUN/creatinine normal. Hb 13.7.  MCV 98.  WBCs 3.9.  Platelets 57. No coags thus  far.  Never had EGD or colonoscopy in the past.    Past Medical History:  Diagnosis Date  . Acute renal failure (HCC)   . Acute respiratory failure (HCC)    2017  . Acute respiratory failure with hypoxia (HCC)   . Acute saddle pulmonary embolism with acute cor pulmonale (HCC)   . Chronic neck pain   . Closed fracture of nasal bone   . DDD (degenerative disc disease), cervical 05/04/2015  . Faintness   . Hemoptysis   . History of atrial fibrillation   . History of head injury 05/04/2015  . History of tachycardia 05/04/2015  . Hypertension   . Hypotension   . Pulmonary emboli (HCC)    on elequis  . Pulmonary embolism (HCC)   . Right eye injury   . Saddle pulmonary embolus (HCC) 12/13/2015  . Syncope and collapse   . Tachycardia 12/20/2015    Past Surgical History:  Procedure Laterality Date  . neck fusion     c5-7  . TONSILLECTOMY      Prior to Admission medications   Medication Sig Start Date End Date Taking? Authorizing Provider  albuterol (VENTOLIN HFA) 108 (90 Base) MCG/ACT inhaler INHALE 2 PUFFS INTO THE LUNGS EVERY 6 HOURS AS NEEDED (COUGH). Patient taking differently: Inhale 2  puffs into the lungs every 6 (six) hours as needed for wheezing or shortness of breath.  05/04/19  Yes Inez Catalina, MD  apixaban (ELIQUIS) 5 MG TABS tablet Take 1 tablet (5 mg total) by mouth 2 (two) times daily. 10/15/16  Yes Enedina Finner, MD  Multiple Vitamins-Minerals (MULTI + OMEGA-3 ADULT GUMMIES PO) Take 1 tablet by mouth daily.   Yes [provider]  polyethylene glycol (MIRALAX / GLYCOLAX) packet Take 17 g by mouth daily. Patient taking differently: Take 17 g by mouth daily as needed for mild constipation.  07/30/17  Yes Arnetha Courser, MD  propranolol (INDERAL) 10 MG tablet Take 10 mg by mouth 3 (three) times daily.  01/14/16  Yes [provider]  Buprenorphine HCl-Naloxone HCl (ZUBSOLV) 5.7-1.4 MG SUBL Place 1 tablet under the tongue 3 (three) times daily. Patient not  taking: Reported on 01/27/2020 05/22/19   [name], MD  chlordiazePOXIDE (LIBRIUM) 25 MG capsule 25 mg PO TID x 1D, then 25 mg PO BID X 1D, then 25 mg PO QD X 1D Patient not taking: Reported on 01/27/2020 04/05/19   Cathren Laine, MD  gabapentin (NEURONTIN) 300 MG capsule Take 1 capsule (300 mg total) by mouth 2 (two) times daily. Patient not taking: Reported on 01/27/2020 03/25/18   Gust Rung, DO    Scheduled Meds: . sodium chloride flush  3 mL Intravenous Once   Infusions:  PRN Meds:    Allergies as of 01/27/2020 - Review Complete 01/27/2020  Allergen Reaction Noted  . Tramadol Anaphylaxis and Rash 12/29/2007  . Other Other (See Comments) 07/09/2012    Family History  Problem Relation Age of Onset  . Hypertension Mother   . COPD Mother   . Mesothelioma Father   . COPD Father   . Peripheral vascular disease Brother        Clots in his arteries surgically removed    Social History   Socioeconomic History  . Marital status: Single    Spouse name: Not on file  . Number of children: Not on file  . Years of education: Not on file  . Highest education level: Not on file  Occupational History  . Not on file  Tobacco Use  . Smoking status: Never Smoker  . Smokeless tobacco: Never Used  Substance and Sexual Activity  . Alcohol use: No    Alcohol/week: 0.0 standard drinks  . Drug use: No  . Sexual activity: Not on file  Other Topics Concern  . 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   Social Determinants of Health   Financial Resource Strain:   . Difficulty of Paying Living Expenses:   Food Insecurity:   . Worried About Programme researcher, broadcasting/film/video in the Last Year:   . Barista in the Last Year:   Transportation Needs:   . Freight forwarder (Medical):   Marland Kitchen Lack of Transportation (Non-Medical):   Physical Activity:   . Days of Exercise per Week:   . Minutes of Exercise per  Session:   Stress:   . Feeling of Stress :   Social Connections:   . Frequency of Communication with Friends and Family:   . Frequency of Social Gatherings with Friends and Family:   . Attends Religious Services:   . Active Member of Clubs or Organizations:   . Attends Banker Meetings:   Marland Kitchen Marital Status:   Intimate  Partner Violence:   . Fear of Current or Ex-Partner:   . Emotionally Abused:   Marland Kitchen Physically Abused:   . Sexually Abused:     REVIEW OF SYSTEMS: ROS is O/W negative except as mentioned in HPI.   PHYSICAL EXAM: Vital signs in last 24 hours: Vitals:   01/27/20 1359 01/27/20 1501  BP:  109/78  Pulse: 60 69  Resp:  16  Temp:    SpO2: 94% 98%   Wt Readings from Last 3 Encounters:  01/27/20 86.2 kg  01/26/20 90.7 kg  04/14/19 84.1 kg   General:  Alert, Well-developed, well-nourished, pleasant and cooperative in NAD Head:  Normocephalic and atraumatic. Eyes:  Scleral icterus noted. Ears:  Normal auditory acuity. Mouth:  No deformity or lesions.   Lungs:  Clear throughout to auscultation.  No wheezes, crackles, or rhonchi.  Heart:  Regular rate and rhythm; no murmurs, clicks, rubs, or gallops. Abdomen:  Softly distended.  BS present.  Non-tender. Msk:  Symmetrical without gross deformities. Pulses:  Normal pulses noted. Extremities:  1=2+ pitting edema noted in B/L LEs Neurologic:  Alert and oriented x 4;  grossly normal neurologically. Skin:  Intact without significant lesions or rashes. Psych:  Alert and cooperative. Normal mood and affect.  LAB RESULTS: Recent Labs    01/26/20 1632 01/27/20 1020  WBC 4.0 3.9*  HGB 13.8 13.7  HCT 41.1 40.4  PLT 57* 57*   BMET Lab Results  Component Value Date   NA 146 (H) 01/27/2020   NA 141 01/26/2020   NA 139 04/05/2019   K 3.7 01/27/2020   K 3.5 01/26/2020   K 3.9 04/05/2019   CL 108 01/27/2020   CL 107 01/26/2020   CL 103 04/05/2019   CO2 27 01/27/2020   CO2 27 01/26/2020   CO2 26  04/05/2019   GLUCOSE 126 (H) 01/27/2020   GLUCOSE 121 (H) 01/26/2020   GLUCOSE 140 (H) 04/05/2019   BUN 7 01/27/2020   BUN 5 (L) 01/26/2020   BUN 8 04/05/2019   CREATININE 0.82 01/27/2020   CREATININE 0.76 01/26/2020   CREATININE 0.65 04/05/2019   CALCIUM 8.8 (L) 01/27/2020   CALCIUM 8.3 (L) 01/26/2020   CALCIUM 9.2 04/05/2019   LFT Recent Labs    01/26/20 1632 01/27/20 1300  PROT 6.6 6.4*  ALBUMIN 3.2* 3.2*  AST 182* 173*  ALT 67* 66*  ALKPHOS 77 83  BILITOT 6.5* 7.0*  BILIDIR  --  2.0*  IBILI  --  5.0*   PT/INR Lab Results  Component Value Date   INR 1.19 12/20/2015   INR 1.14 12/12/2015   Hepatitis Panel No results for input(s): HEPBSAG, HCVAB, HEPAIGM, HEPBIGM in the last 72 hours. C-Diff No components found for: CDIFF Lipase     Component Value Date/Time   LIPASE 57 (H) 01/27/2020 1300    Drugs of Abuse     Component Value Date/Time   LABOPIA NONE DETECTED 04/05/2019 0931   COCAINSCRNUR NONE DETECTED 04/05/2019 0931   LABBENZ POSITIVE (A) 04/05/2019 0931   AMPHETMU NONE DETECTED 04/05/2019 0931   THCU NONE DETECTED 04/05/2019 0931   LABBARB NONE DETECTED 04/05/2019 0931     RADIOLOGY STUDIES: DG Chest 2 View  Result Date: 01/27/2020 CLINICAL DATA:  Pt presents with complaints of feeling like he was bloated in his abdomen. Reports it is making him feel short of breath. Pt is having blood in his stool x 2 years. Reports history history of blood clot and that this does not  feel the same. EXAM: CHEST - 2 VIEW COMPARISON:  none FINDINGS: Clearance of left lower lobe airspace disease. Relatively low lung volumes. Some increase in coarse interstitial markings in a predominantly perihilar and bibasilar distribution. Heart size and mediastinal contours are within normal limits. No effusion.  No pneumothorax. Cervical fixation hardware noted. IMPRESSION: Resolution of left lower lobe airspace disease. Worsening perihilar and bibasilar interstitial infiltrates or  edema. Electronically Signed   By: Corlis Leak M.D.   On: 01/27/2020 11:49   US Abdomen Limited  Result Date: 01/27/2020 CLINICAL DATA:  Jaundice.  Abdominal swelling and pain EXAM: ULTRASOUND ABDOMEN LIMITED RIGHT UPPER QUADRANT COMPARISON:  None. FINDINGS: Gallbladder: No gallstones or wall thickening visualized. No sonographic Murphy sign noted by sonographer. Gallbladder is dilated. Common bile duct: Diameter: 9.4 mm.  Common bile duct is dilated. Liver: Increased echogenicity of the liver diffusely with irregularity of the capsule compatible cirrhosis. There is moderate ascites around the liver. 8 mm cyst in the left lobe of the liver. Portal vein is patent on color Doppler imaging with normal direction of blood flow towards the liver. Other: Moderate ascites. IMPRESSION: Dilated gallbladder without gallstones. Common bile duct dilated at 9.4 mm Changes of  cirrhosis of the liver with moderate ascites. Electronically Signed   By: Marlan Palau M.D.   On: 01/27/2020 14:49    ENDOSCOPIC STUDIES: None  IMPRESSION:   *   Cirrhosis of liver in alcoholic.  New dx/Previous steatosis.  LFTs elevated in the past and viral hepatitis studies negative in 2019.  They suspected due to ETOH at that time.  They recommended Hep A and B vaccines, but it does not look like they were ever given (at least not documented in our system).  *   Ascites.  Moderate per ultrasound. Pt c/o distention that causing difficulty taking deep breaths.  Also B/L LE edema.    *   Dilated CBD w/o obvious stones. ETOH hepatitis suspected. Elevated blood alcohol, drank liquor this AM.    *   Thrombocytopenia:  Platelets 57.  *   Hx 2017 saddle PE, Rx Eliquis.  Last dose 7/28 in AM.    *   Chronic Pain   PLAN:     -MRCP to further evaluate bile duct dilation. -Needs ETOH cessation. -Will need EGD and colonoscopy at some point to screen for varices and colon cancer respectively, ? Inpatient vs outpatient. -Will need Hep  A and B vaccinations. -I ordered PT/INR. -Can give him PO diuretics to try to get some fluid off.  ? Start with lasix 40 mg daily and aldactone 100 mg daily.   Jennye Moccasin  01/27/2020, 3:55 PM Phone [phone]

## 2020-01-27 NOTE — ED Provider Notes (Signed)
[facility] EMERGENCY DEPARTMENT Provider Note   CSN: [id number] Arrival date & time: 01/27/20  6834     History Chief Complaint  Patient presents with  . Chest Pain  . Shortness of Breath  . Alcohol Problem    Jared Bass is a 49 y.o. male.  Presents to ER with concern for abdominal pain, abdominal distention.  Reports over the last couple days he is also noted some yellowing of his skin in his eyes.  Pain is currently mild to moderate severity, does not want to take any medication for his pain.  No associated vomiting, no diarrhea or constipation.  No dysuria or hematuria.  No fever.  He denies any chest pain, feels like it is hard to take a deep breath because of his abdominal distention.  Past medical history noted for pulmonary embolism on Eliquis, alcohol abuse (drinks 1/5 daily).  HPI     Past Medical History:  Diagnosis Date  . Acute renal failure (HCC)   . Acute respiratory failure (HCC)    2017  . Acute respiratory failure with hypoxia (HCC)   . Acute saddle pulmonary embolism with acute cor pulmonale (HCC)   . Chronic neck pain   . Closed fracture of nasal bone   . DDD (degenerative disc disease), cervical 05/04/2015  . Faintness   . Hemoptysis   . History of atrial fibrillation   . History of head injury 05/04/2015  . History of tachycardia 05/04/2015  . Hypertension   . Hypotension   . Pulmonary emboli (HCC)    on elequis  . Pulmonary embolism (HCC)   . Right eye injury   . Saddle pulmonary embolus (HCC) 12/13/2015  . Syncope and collapse   . Tachycardia 12/20/2015    Patient Active Problem List   Diagnosis Date Noted  . Transaminitis 09/10/2017  . Alcohol use disorder, moderate, dependence (HCC) 07/11/2017  . GERD (gastroesophageal reflux disease) 03/12/2017  . Opioid use disorder, moderate, dependence (HCC) 01/22/2017  . Cervical spinal stenosis 05/04/2015  . Cervical foraminal stenosis (right side) 05/04/2015  . Generalized  anxiety disorder 05/04/2015    Past Surgical History:  Procedure Laterality Date  . neck fusion     c5-7  . TONSILLECTOMY         Family History  Problem Relation Age of Onset  . Hypertension Mother   . COPD Mother   . Mesothelioma Father   . COPD Father   . Peripheral vascular disease Brother        Clots in his arteries surgically removed    Social History   Tobacco Use  . Smoking status: Never Smoker  . Smokeless tobacco: Never Used  Substance Use Topics  . Alcohol use: No    Alcohol/week: 0.0 standard drinks  . Drug use: No    Home Medications Prior to Admission medications   Medication Sig Start Date End Date Taking? Authorizing Provider  albuterol (VENTOLIN HFA) 108 (90 Base) MCG/ACT inhaler INHALE 2 PUFFS INTO THE LUNGS EVERY 6 HOURS AS NEEDED (COUGH). Patient taking differently: Inhale 2 puffs into the lungs every 6 (six) hours as needed for wheezing or shortness of breath.  05/04/19  Yes Inez Catalina, MD  apixaban (ELIQUIS) 5 MG TABS tablet Take 1 tablet (5 mg total) by mouth 2 (two) times daily. 10/15/16  Yes Enedina Finner, MD  Multiple Vitamins-Minerals (MULTI + OMEGA-3 ADULT GUMMIES PO) Take 1 tablet by mouth daily.   Yes [provider]  polyethylene  glycol (MIRALAX / GLYCOLAX) packet Take 17 g by mouth daily. Patient taking differently: Take 17 g by mouth daily as needed for mild constipation.  07/30/17  Yes Arnetha Courser, MD  propranolol (INDERAL) 10 MG tablet Take 10 mg by mouth 3 (three) times daily.  01/14/16  Yes [provider]  Buprenorphine HCl-Naloxone HCl (ZUBSOLV) 5.7-1.4 MG SUBL Place 1 tablet under the tongue 3 (three) times daily. Patient not taking: Reported on 01/27/2020 05/22/19   [name], MD  chlordiazePOXIDE (LIBRIUM) 25 MG capsule 25 mg PO TID x 1D, then 25 mg PO BID X 1D, then 25 mg PO QD X 1D Patient not taking: Reported on 01/27/2020 04/05/19   Cathren Laine, MD  gabapentin (NEURONTIN) 300 MG capsule Take 1  capsule (300 mg total) by mouth 2 (two) times daily. Patient not taking: Reported on 01/27/2020 03/25/18   Gust Rung, DO    Allergies    Tramadol and Other  Review of Systems   Review of Systems  Constitutional: Negative for chills and fever.  HENT: Negative for ear pain and sore throat.   Eyes: Negative for pain and visual disturbance.  Respiratory: Positive for shortness of breath. Negative for cough.   Cardiovascular: Negative for chest pain and palpitations.  Gastrointestinal: Positive for abdominal distention and abdominal pain. Negative for vomiting.  Genitourinary: Negative for dysuria and hematuria.  Musculoskeletal: Negative for arthralgias and back pain.  Skin: Negative for color change and rash.  Neurological: Negative for seizures and syncope.  All other systems reviewed and are negative.   Physical Exam Updated Vital Signs BP 109/78   Pulse 69   Temp 98.7 F (37.1 C) (Oral)   Resp 16   Ht 5\' 8"  (1.727 m)   Wt 86.2 kg   SpO2 98%   BMI 28.89 kg/m   Physical Exam Vitals and nursing note reviewed.  Constitutional:      Appearance: He is well-developed.  HENT:     Head: Normocephalic and atraumatic.  Eyes:     Conjunctiva/sclera: Conjunctivae normal.     Comments: Scleral icterus present  Cardiovascular:     Rate and Rhythm: Normal rate and regular rhythm.     Heart sounds: No murmur heard.   Pulmonary:     Effort: Pulmonary effort is normal. No respiratory distress.     Breath sounds: Normal breath sounds.  Abdominal:     Comments: Somewhat distended but soft, no rebound or guarding, there is some tenderness in the right upper quadrant  Musculoskeletal:     Cervical back: Neck supple.  Skin:    General: Skin is warm and dry.     Comments: Jaundiced  Neurological:     Mental Status: He is alert.     ED Results / Procedures / Treatments   Labs (all labs ordered are listed, but only abnormal results are displayed) Labs Reviewed  BASIC  METABOLIC PANEL - Abnormal; Notable for the following components:      Result Value   Sodium 146 (*)    Glucose, Bld 126 (*)    Calcium 8.8 (*)    All other components within normal limits  CBC - Abnormal; Notable for the following components:   WBC 3.9 (*)    RBC 4.10 (*)    RDW 17.3 (*)    Platelets 57 (*)    All other components within normal limits  URINALYSIS, ROUTINE W REFLEX MICROSCOPIC - Abnormal; Notable for the following components:   Color, Urine  AMBER (*)    APPearance HAZY (*)    Bilirubin Urine SMALL (*)    All other components within normal limits  HEPATIC FUNCTION PANEL - Abnormal; Notable for the following components:   Total Protein 6.4 (*)    Albumin 3.2 (*)    AST 173 (*)    ALT 66 (*)    Total Bilirubin 7.0 (*)    Bilirubin, Direct 2.0 (*)    Indirect Bilirubin 5.0 (*)    All other components within normal limits  LIPASE, BLOOD - Abnormal; Notable for the following components:   Lipase 57 (*)    All other components within normal limits  SARS CORONAVIRUS 2 BY RT PCR (HOSPITAL ORDER, PERFORMED IN [facility])  BRAIN NATRIURETIC PEPTIDE  POC OCCULT BLOOD, ED  TROPONIN I (HIGH SENSITIVITY)  TROPONIN I (HIGH SENSITIVITY)    EKG EKG Interpretation  Date/Time:  Wednesday January 27 2020 10:19:44 EDT Ventricular Rate:  78 PR Interval:  180 QRS Duration: 80 QT Interval:  396 QTC Calculation: 451 R Axis:   52 Text Interpretation: Normal sinus rhythm Normal ECG Confirmed by Marianna Fuss ([id number]) on 01/27/2020 12:38:45 PM   Radiology DG Chest 2 View  Result Date: 01/27/2020 CLINICAL DATA:  Pt presents with complaints of feeling like he was bloated in his abdomen. Reports it is making him feel short of breath. Pt is having blood in his stool x 2 years. Reports history history of blood clot and that this does not feel the same. EXAM: CHEST - 2 VIEW COMPARISON:  none FINDINGS: Clearance of left lower lobe airspace disease. Relatively low lung  volumes. Some increase in coarse interstitial markings in a predominantly perihilar and bibasilar distribution. Heart size and mediastinal contours are within normal limits. No effusion.  No pneumothorax. Cervical fixation hardware noted. IMPRESSION: Resolution of left lower lobe airspace disease. Worsening perihilar and bibasilar interstitial infiltrates or edema. Electronically Signed   By: Corlis Leak M.D.   On: 01/27/2020 11:49   US Abdomen Limited  Result Date: 01/27/2020 CLINICAL DATA:  Jaundice.  Abdominal swelling and pain EXAM: ULTRASOUND ABDOMEN LIMITED RIGHT UPPER QUADRANT COMPARISON:  None. FINDINGS: Gallbladder: No gallstones or wall thickening visualized. No sonographic Murphy sign noted by sonographer. Gallbladder is dilated. Common bile duct: Diameter: 9.4 mm.  Common bile duct is dilated. Liver: Increased echogenicity of the liver diffusely with irregularity of the capsule compatible cirrhosis. There is moderate ascites around the liver. 8 mm cyst in the left lobe of the liver. Portal vein is patent on color Doppler imaging with normal direction of blood flow towards the liver. Other: Moderate ascites. IMPRESSION: Dilated gallbladder without gallstones. Common bile duct dilated at 9.4 mm Changes of  cirrhosis of the liver with moderate ascites. Electronically Signed   By: Marlan Palau M.D.   On: 01/27/2020 14:49    Procedures Procedures (including critical care time)  Medications Ordered in ED Medications  sodium chloride flush (NS) 0.9 % injection 3 mL (has no administration in time range)    ED Course  I have reviewed the triage vital signs and the nursing notes.  Pertinent labs & imaging results that were available during my care of the patient were reviewed by me and considered in my medical decision making (see chart for details).    MDM Rules/Calculators/A&P                          49 year old male presents to  ER with concern for abdominal distention, abdominal pain  and yellowing of skin.  On exam noted scleral icterus, somewhat jaundiced skin, had some tenderness in his right upper quadrant.  Lab work was concerning for elevated T bili, ultrasound demonstrating dilated gallbladder without gallstones, dilated common bile duct.  Reviewed case with Dr. Barron Alvine with GI, recommending admission to medicine, MRCP, someone from their team will evaluate.  Will consult internal medicine for admission.  Final Clinical Impression(s) / ED Diagnoses Final diagnoses:  Alcohol abuse  Serum total bilirubin elevated  Common bile duct dilation    Rx / DC Orders ED Discharge Orders    None       Milagros Loll, MD 01/27/20 1537

## 2020-01-27 NOTE — H&P (Signed)
Date: 01/27/2020               Patient Name:  Jared Bass MRN: [id number]  DOB: 11/27/1970 Age / Sex: 49 y.o., male   PCP: Sherrie Mustache, MD              Medical Service: Internal Medicine Teaching Service              Attending Physician: Dr. [name], MD    First Contact: Clemetine Marker, MS3 Pager: [phone]  Second Contact: Dr. Arna Snipe  Pager: [phone]  Third Contact Dr. Hermine Messick  Pager: [phone]       After Hours (After 5p/  First Contact Pager: [phone]  weekends / holidays): Second Contact Pager: [phone]   Chief Complaint: Abdominal pain & distention  History of Present Illness: Jared Bass is a 49 y.o. male who presents to [place] with a PMHx of chronic heavy alcohol use, opioid use disorder, and PE in 2017, presenting to ED with worsening abdominal distention, pain in the RUQ, and yellowing of the skin.    Patient reports "feeling like his abdomen is bloated like a cow" for a couple of days now.  He notes that it seems to be less than 1 week, to him if he had to guess it was about 3 days. Patient also reports having worsening abdominal pain in the RUQ for the same duration so about 3 days. Characterizing it at a 5-6/10 pain and he denies radiation of pain. Patient also had SOB for the past couple of days, he states that he coughs up phlegm, but no blood. Patient denies any N/V recently, he does have a history of this in the past though saying last time was a couple of months ago. Patient states that he has noticed yellow tinting of his skin and that with his vision, he sees yellow currently. Patient states that a couple of days ago, patient saw blood in his stools and it was "bright red", but recently he has not noticed any blood since and reports feeling better with this now. He has never had a colonoscopy or EGD, he states that the idea of the procedures "freaks him out". Reports drinking about 1/5-1 gallon of Vodka per night and smokes weed regularly.  Past Medical  History:  Diagnosis Date  . Acute renal failure (HCC)   . Acute respiratory failure (HCC)    2017  . Acute respiratory failure with hypoxia (HCC)   . Acute saddle pulmonary embolism with acute cor pulmonale (HCC)   . Chronic neck pain   . Closed fracture of nasal bone   . DDD (degenerative disc disease), cervical 05/04/2015  . Faintness   . Hemoptysis   . History of atrial fibrillation   . History of head injury 05/04/2015  . History of tachycardia 05/04/2015  . Hypertension   . Hypotension   . Pulmonary emboli (HCC)    on elequis  . Pulmonary embolism (HCC)   . Right eye injury   . Saddle pulmonary embolus (HCC) 12/13/2015  . Syncope and collapse   . Tachycardia 12/20/2015  Patient states that he is also ADHD and took ritalin for it a long time ago. They stopped prescribing it to him because of his age, he states. Patient had a PE back in 2017. Patient also states that he had neck surgery and that the procedure started with the letter "A", but could not remember the name. That was back in 2008 and 2010. Patient is  unsure if he has ever had a blood transfusion.   Meds:  Current Meds  Medication Sig  . albuterol (VENTOLIN HFA) 108 (90 Base) MCG/ACT inhaler INHALE 2 PUFFS INTO THE LUNGS EVERY 6 HOURS AS NEEDED (COUGH). (Patient taking differently: Inhale 2 puffs into the lungs every 6 (six) hours as needed for wheezing or shortness of breath. )  . apixaban (ELIQUIS) 5 MG TABS tablet Take 1 tablet (5 mg total) by mouth 2 (two) times daily.  . Multiple Vitamins-Minerals (MULTI + OMEGA-3 ADULT GUMMIES PO) Take 1 tablet by mouth daily.  . polyethylene glycol (MIRALAX / GLYCOLAX) packet Take 17 g by mouth daily. (Patient taking differently: Take 17 g by mouth daily as needed for mild constipation. )  . propranolol (INDERAL) 10 MG tablet Take 10 mg by mouth 3 (three) times daily.   Patient reports that he only is using eliquis at home. He last used ELIQUIS this morning. Patient states that he  stopped using oxycodone a few years ago. He was on Subutex however stopped taking it about 5 months ago.   Allergies: Allergies as of 01/27/2020 - Review Complete 01/27/2020  Allergen Reaction Noted  . Tramadol Anaphylaxis and Rash 12/29/2007  . Other Other (See Comments) 07/09/2012  Tramadol he reports that he breakouts and feel like he can't breathe. White brine cheese strips he reports he gets fat lips.  Family History:  Family History  Problem Relation Age of Onset  . Hypertension Mother   . COPD Mother   . Mesothelioma Father   . COPD Father   . Peripheral vascular disease Brother        Clots in his arteries surgically removed    Social History: Patient states that he lives with his aunt and uncle. His last drink was this morning. He has received librium last time for alcohol withdrawal. Unclear with the setting of when. Unable to obtain further information due to MRI procedure.   Review of Systems: A complete ROS was negative except as per HPI.   Physical Exam: Blood pressure 109/78, pulse 75, temperature 98.7 F (37.1 C), temperature source Oral, resp. rate 16, height 5\' 8"  (1.727 m), weight 86.2 kg, SpO2 97 %. Physical Exam: GEN: NAD, lying in bed, appeared yellow on examination CV: RRR, no murmurs, rubs, or gallops PULM: CTA B, no wheezing, rhonchi or crackles ABD: Soft, +BS, tender to palpation in the RUQ, abdominal distention was noted.  EXT: +2 bilateral pitting edema from the bilateral lower extremities, 1/2 the shins and down PSYCH: Appropriate and normal mood   Labs:  EKG: personally reviewed my interpretation is normal sinus rhythm  CXR: personally reviewed my interpretation is bilateral infiltrates with density of markings in the perihilar and bibasilar regions U/S: Dilated gallbladder w/o gallstones and common bile duct dilated. Changes of cirrhosis of liver with moderate ascites.  Lipase: Elevated at 55==>57 CMP: Elevations in glucose (126), AST (182),  ALT (67), total bili (6.5). Decreases in BUN (5), albumin (3.2) CBC: Thrombocytopenic (57) Ethanol: Elevated at 281  Troponinx2: WNL  Urinalysis: Showed amber color, hazy appearance and small amounts of bilirubin Hepatic function panel: Elevations in AST (173), ALT (66), total bili (7.0), direct bili (2.0), indirect bili (5.0) and decreases in albumin (3.2) and total protein (6.4) POC occult blood: Pending BNP: WNL  PT/INR: Pending  HIV Ab: Pending  Sars COVID: Pending Hepatic panel: Pending   Assessment & Plan by Problem: Jared Bass is a 49 y.o. male who presents to 42 with  a PMHx of chronic heavy alcohol use, opioid use disorder, and PE in 2017, presenting to ED with worsening abdominal distention, pain in the RUQ, and yellowing of the skin concerning for alcoholic hepatitis that has likely progressed to cirrhosis.   Active Problems:   Cirrhosis (HCC) Ascites and Jaundice secondary to Cirrhosis. Patient's current clinical presentation is concerning for alcoholic hepatitis given the jaundice and volume overload clinical status with ascites and bilateral pitting edema. Patient has a chronic history of heavy alcohol use in the past and his AST:ALT ratio came back elevated at a 2:1 ratio, further supporting that this is a likely alcohol hepatitis etiology which progressed to now cirrhosis. Thrombocytopenia noted is likely secondary to cirrhosis. Team is still going to do a complete workout and will evaluate further for any other etiology for his cirrhosis. Viral hepatitis panel and further imaging studies were to aid in this. On admission U/S returned and is concerning for changes of cirrhosis of liver with moderate ascites. MRCP is currently pending. PT/INR were ordered to assess for further assessment of liver function. GI has seen our patient and greatly appreciate their assistance and will continue to appreciate their recs. MRCP was suggested for bile duct dilation noted on U/S. Consider EGD and  colonoscopy for further work-up after patient has stabilized, maybe in-patient or out-patient, our team will continue to monitor patient to assess when would be best. We will ask patient about HEP A & B vaccinations tomorrow. Plan is to diurese on 100:40 ratio of spironolactone:lasix. Monitor I&O's. Given his heavy alcohol use, we will start this patient on CIWA + Ativan. Concerned for dietary deficiencies since alcoholics are more prone to vitamin deficiencies, so administered folic acid and thiamine as a routine.   Plan:  --F/u MRCP --F/u Hepatitis panel  --Daily CBC, CMP --F/u PT/INR  --Monitor I&Os --Ask about Hep A&B vaccinations in [place] --Start lasix 40 mg daily and aldactone 100 mg daily P.O. --GI following, appreciate recommendations  Alcohol use disorder. Patient reports having had "4 shots today" on arrival to the ED. Usually has about 1/5-1 gallon of alcohol a day. Patient has a history of this and has been a patient of the IMTS team in the past. This is likely the source of his presentation today. Unclear if he has ever abstained from drinking, so the team started him on CIWA + Ativan in case this patient begins to withdrawal. Will continue to monitor closely.  Plan: --Abstain from alcohol --Started on CIWA + Ativan --Thiamine, folic acid, and multivitamin  Opioid use disorder. Patient has a history for this and was receiving Zubsolv TID. Last visit was in 04/14/19. Patient reported on arrival to ED he hasn't taken this medication for about 5 months now. Patient denied use of opioids today, will continue to monitor closely.   Plan: --Follow up outpatient, may benefit from restarting Subutex  PE: On eliquis at home. Noted in 2017. Last dose this AM. Holding for now.  -Resume eliquis when able  Bilateral interstitial infiltrates. Unclear etiology given that this patient did not have any systemic symptoms for infection so that would rule out pneumonia. Labs came back WNL for  infection. Appears to have worsening perihilar and bibasilar thickening. Will continue to monitor.   Dispo: Admit patient to Inpatient with expected length of stay greater than 2 midnights.  Signed: Clemetine Marker, Medical Student 01/27/2020, 4:51 PM   Attestation for Student Documentation:  I personally was present and performed or re-performed the history, physical exam  and medical decision-making activities of this service and have verified that the service and findings are accurately documented in the student's note.  Claudean Severance, MD 01/27/2020, 6:51 PM

## 2020-01-27 NOTE — ED Notes (Signed)
Pt being transported to [facility]

## 2020-01-27 NOTE — ED Triage Notes (Signed)
Pt presents with complaints of feeling like he was bloated in his abdomen. Reports it is making him feel short of breath. Pt is having blood in his stool x 2 years. Reports history history of blood clot and that this does not feel the same. Per Dahlia Byes pt should go to ER for further work up. Pt verbalized understanding.

## 2020-01-27 NOTE — ED Triage Notes (Signed)
Pt is here with chest pain,sob, etoh problems, pain all over, abdominal swelling.  Pt is on Eliquis for PE and a fib.  Pt states last etoh this am at 0730

## 2020-01-27 NOTE — Progress Notes (Signed)
Patient transferred into bed. Patient is alert and oriented x4 with VS WNL. Patient c/o abdominal fullness but states he feels some better since arrival to hospital.

## 2020-01-27 NOTE — ED Notes (Signed)
Patient transported to MRI 

## 2020-01-27 NOTE — ED Notes (Signed)
Ambulated to RR.

## 2020-01-28 LAB — COMPREHENSIVE METABOLIC PANEL
ALT: 61 U/L — ABNORMAL HIGH (ref 0–44)
AST: 142 U/L — ABNORMAL HIGH (ref 15–41)
Albumin: 3.2 g/dL — ABNORMAL LOW (ref 3.5–5.0)
Alkaline Phosphatase: 78 U/L (ref 38–126)
Anion gap: 13 (ref 5–15)
BUN: 5 mg/dL — ABNORMAL LOW (ref 6–20)
CO2: 24 mmol/L (ref 22–32)
Calcium: 8.6 mg/dL — ABNORMAL LOW (ref 8.9–10.3)
Chloride: 103 mmol/L (ref 98–111)
Creatinine, Ser: 0.72 mg/dL (ref 0.61–1.24)
GFR calc Af Amer: >60 mL/min (ref >60)
GFR calc non Af Amer: >60 mL/min (ref >60)
Glucose, Bld: 85 mg/dL (ref 70–99)
Potassium: 3.7 mmol/L (ref 3.5–5.1)
Sodium: 140 mmol/L (ref 135–145)
Total Bilirubin: 8.3 mg/dL — ABNORMAL HIGH (ref 0.3–1.2)
Total Protein: 6.6 g/dL (ref 6.5–8.1)

## 2020-01-28 LAB — IRON AND TIBC
Iron: 217 ug/dL — ABNORMAL HIGH (ref 45–182)
Saturation Ratios: 86 % — ABNORMAL HIGH (ref 17.9–39.5)
TIBC: 252 ug/dL (ref 250–450)
UIBC: 35 ug/dL

## 2020-01-28 LAB — CBC
HCT: 39 % (ref 39.0–52.0)
Hemoglobin: 13.7 g/dL (ref 13.0–17.0)
MCH: 33.8 pg (ref 26.0–34.0)
MCHC: 35.1 g/dL (ref 30.0–36.0)
MCV: 96.3 fL (ref 80.0–100.0)
Platelets: 44 10*3/uL — ABNORMAL LOW (ref 150–400)
RBC: 4.05 MIL/uL — ABNORMAL LOW (ref 4.22–5.81)
RDW: 16.7 % — ABNORMAL HIGH (ref 11.5–15.5)
WBC: 4.9 10*3/uL (ref 4.0–10.5)
nRBC: 0 % (ref 0.0–0.2)

## 2020-01-28 LAB — ALBUMIN: Albumin: 3.2 g/dL — ABNORMAL LOW (ref 3.5–5.0)

## 2020-01-28 LAB — HEPATITIS PANEL, ACUTE
HCV Ab: NONREACTIVE
Hep A IgM: NONREACTIVE
Hep B C IgM: NONREACTIVE
Hepatitis B Surface Ag: NONREACTIVE

## 2020-01-28 LAB — PROTIME-INR
INR: 1.7 — ABNORMAL HIGH (ref 0.8–1.2)
Prothrombin Time: 19.7 seconds — ABNORMAL HIGH (ref 11.4–15.2)

## 2020-01-28 LAB — GLUCOSE, CAPILLARY: Glucose-Capillary: 70 mg/dL (ref 70–99)

## 2020-01-28 LAB — FERRITIN: Ferritin: 355 ng/mL — ABNORMAL HIGH (ref 24–336)

## 2020-01-28 LAB — PROTEIN, TOTAL: Total Protein: 6.7 g/dL (ref 6.5–8.1)

## 2020-01-28 MED ORDER — DICLOFENAC SODIUM 1 % EX GEL
4.0000 g | Freq: Four times a day (QID) | CUTANEOUS | Status: DC
  Administered 2020-01-28 – 2020-01-29 (×2): 4 g via TOPICAL
  Filled 2020-01-28: qty 100

## 2020-01-28 MED ORDER — PHYTONADIONE 5 MG PO TABS
2.5000 mg | ORAL_TABLET | Freq: Once | ORAL | Status: AC
  Administered 2020-01-28: 2.5 mg via ORAL
  Filled 2020-01-28: qty 1

## 2020-01-28 MED ORDER — IBUPROFEN 200 MG PO TABS
400.0000 mg | ORAL_TABLET | Freq: Three times a day (TID) | ORAL | Status: AC | PRN
  Administered 2020-01-28 – 2020-01-29 (×2): 400 mg via ORAL
  Filled 2020-01-28 (×2): qty 2

## 2020-01-28 MED ORDER — FUROSEMIDE 20 MG PO TABS
20.0000 mg | ORAL_TABLET | Freq: Every day | ORAL | Status: DC
  Administered 2020-01-29 – 2020-01-31 (×3): 20 mg via ORAL
  Filled 2020-01-28 (×3): qty 1

## 2020-01-28 MED ORDER — SPIRONOLACTONE 25 MG PO TABS
50.0000 mg | ORAL_TABLET | Freq: Every day | ORAL | Status: DC
  Administered 2020-01-29 – 2020-01-31 (×3): 50 mg via ORAL
  Filled 2020-01-28 (×3): qty 2

## 2020-01-28 MED ORDER — LIDOCAINE 5 % EX PTCH
1.0000 | MEDICATED_PATCH | Freq: Every day | CUTANEOUS | Status: DC | PRN
  Administered 2020-01-28: 1 via TRANSDERMAL

## 2020-01-28 NOTE — Progress Notes (Signed)
Gastroenterology Progress Note  CC:  Cirrhosis, elevated LFTs, dilated CBD  Subjective:  Feels a little better.  No new complaints.  Wants to eat.  Knows that he has to stop drinking ETOH.  Objective:  Vital signs in last 24 hours: Temp:  [97.4 F (36.3 C)-98.7 F (37.1 C)] 97.4 F (36.3 C) (07/29 0806) Pulse Rate:  [57-89] 57 (07/29 0806) Resp:  [16-23] 19 (07/28 2125) BP: (109-131)/(60-94) 121/85 (07/29 0806) SpO2:  [94 %-100 %] 99 % (07/29 0806) Weight:  [92.3 kg] 92.3 kg (07/29 0500)   General:  Alert, Well-developed, in NAD; scleral icterus noted. Heart:  Regular rate and rhythm; no murmurs Pulm:  CTAB.  No increased WOB. Abdomen:  Softly distended.  BS present.  Non-tender. Extremities:  Trace edema in B/L LEs. Neurologic:  Alert and oriented x 4;  grossly normal neurologically. Psych:  Alert and cooperative. Normal mood and affect.  Intake/Output from previous day: 07/28 0701 - 07/29 0700 In: 120 [P.O.:120] Out: -   Lab Results: Recent Labs    01/26/20 1632 01/27/20 1020 01/28/20 0130  WBC 4.0 3.9* 4.9  HGB 13.8 13.7 13.7  HCT 41.1 40.4 39.0  PLT 57* 57* 44*   BMET Recent Labs    01/26/20 1632 01/27/20 1020 01/28/20 0130  NA 141 146* 140  K 3.5 3.7 3.7  CL 107 108 103  CO2 27 27 24   GLUCOSE 121* 126* 85  BUN 5* 7 5*  CREATININE 0.76 0.82 0.72  CALCIUM 8.3* 8.8* 8.6*   LFT Recent Labs    01/27/20 1300 01/27/20 1300 01/28/20 0130  PROT 6.4*   < > 6.6  ALBUMIN 3.2*   < > 3.2*  AST 173*   < > 142*  ALT 66*   < > 61*  ALKPHOS 83   < > 78  BILITOT 7.0*   < > 8.3*  BILIDIR 2.0*  --   --   IBILI 5.0*  --   --    < > = values in this interval not displayed.   PT/INR Recent Labs    01/27/20 1841 01/28/20 0130  LABPROT 20.9* 19.7*  INR 1.9* 1.7*   Hepatitis Panel Recent Labs    01/28/20 0130  HEPBSAG NON REACTIVE  HCVAB NON REACTIVE  HEPAIGM NON REACTIVE  HEPBIGM NON REACTIVE    DG Chest 2 View  Result Date:  01/27/2020 CLINICAL DATA:  Pt presents with complaints of feeling like he was bloated in his abdomen. Reports it is making him feel short of breath. Pt is having blood in his stool x 2 years. Reports history history of blood clot and that this does not feel the same. EXAM: CHEST - 2 VIEW COMPARISON:  none FINDINGS: Clearance of left lower lobe airspace disease. Relatively low lung volumes. Some increase in coarse interstitial markings in a predominantly perihilar and bibasilar distribution. Heart size and mediastinal contours are within normal limits. No effusion.  No pneumothorax. Cervical fixation hardware noted. IMPRESSION: Resolution of left lower lobe airspace disease. Worsening perihilar and bibasilar interstitial infiltrates or edema. Electronically Signed   By: 01/29/2020 M.D.   On: 01/27/2020 11:49   01/29/2020 Abdomen Limited  Result Date: 01/27/2020 CLINICAL DATA:  Jaundice.  Abdominal swelling and pain EXAM: ULTRASOUND ABDOMEN LIMITED RIGHT UPPER QUADRANT COMPARISON:  None. FINDINGS: Gallbladder: No gallstones or wall thickening visualized. No sonographic Murphy sign noted by sonographer. Gallbladder is dilated. Common bile duct: Diameter: 9.4 mm.  Common bile duct  is dilated. Liver: Increased echogenicity of the liver diffusely with irregularity of the capsule compatible cirrhosis. There is moderate ascites around the liver. 8 mm cyst in the left lobe of the liver. Portal vein is patent on color Doppler imaging with normal direction of blood flow towards the liver. Other: Moderate ascites. IMPRESSION: Dilated gallbladder without gallstones. Common bile duct dilated at 9.4 mm Changes of  cirrhosis of the liver with moderate ascites. Electronically Signed   By: Marlan Palau M.D.   On: 01/27/2020 14:49   MR ABDOMEN MRCP W WO CONTAST  Result Date: 01/28/2020 CLINICAL DATA:  Right upper quadrant abdominal pain. Biliary ductal dilatation on recent ultrasound. Jaundice. Cirrhosis. EXAM: MRI ABDOMEN WITHOUT  AND WITH CONTRAST (INCLUDING MRCP) TECHNIQUE: Multiplanar multisequence MR imaging of the abdomen was performed both before and after the administration of intravenous contrast. Heavily T2-weighted images of the biliary and pancreatic ducts were obtained, and three-dimensional MRCP images were rendered by post processing. CONTRAST:  8.22mL GADAVIST GADOBUTROL 1 MMOL/ML IV SOLN COMPARISON:  Ultrasound on 01/27/2020 and CT on 07/26/2016 FINDINGS: Lower chest: No acute findings. Hepatobiliary: Mild capsular nodularity is suspicious for cirrhosis. Recanalization of paraumbilical veins is consistent with portal venous hypertension. A sub-cm cyst is seen in the lateral segment of the left lobe, however no liver masses are identified. Moderate to large amount of ascites is seen demonstrated. The gallbladder is distended and shows mild diffuse wall thickening. Mild diffuse biliary ductal dilatation is seen with common bile duct measuring 9 mm in diameter. No evidence of choledocholithiasis or biliary stricture. Pancreas: No mass or inflammatory changes. No evidence of pancreatic ductal dilatation. Spleen:  Within normal limits in size and appearance. Adrenals/Urinary Tract: No masses identified. No evidence of hydronephrosis. Stomach/Bowel: Diffuse small bowel wall thickening and mesenteric edema is seen in addition to ascites, likely due to hypoalbuminemia in the setting of cirrhosis. Vascular/Lymphatic: No pathologically enlarged lymph nodes identified. No abdominal aortic aneurysm. Prominent esophageal varices are seen, as well as varices in the gastrohepatic and gastrosplenic ligaments. Other:  None. Musculoskeletal:  No suspicious bone lesions identified. IMPRESSION: Hepatic cirrhosis and findings of portal venous hypertension, including prominent esophageal varices. No evidence of hepatic neoplasm. Distended gallbladder with diffuse wall thickening, which is nonspecific. This may be secondary to hepatocellular disease  and hypoalbuminemia, although acalculus cholecystitis cannot definitely be excluded. Mild diffuse biliary ductal dilatation, with common bile duct measuring 9 mm. No radiographic evidence of choledocholithiasis or other obstructing etiology. Diffuse small bowel wall thickening and mesenteric edema, likely due to hypoalbuminemia in setting of cirrhosis. Moderate to large amount of ascites. Electronically Signed   By: Danae Orleans M.D.   On: 01/28/2020 08:17   Assessment / Plan: *   Cirrhosis of liver in alcoholic.  New dx/Previous steatosis.  LFTs elevated in the past and viral hepatitis studies negative in 2019.  They suspected due to ETOH at that time.  They recommended Hep A and B vaccines, but it does not look like they were ever given (at least not documented in our system).  MDF is 39.  *   Ascites.  Moderate to large volume per imaging. Pt c/o distention that is causing difficulty taking deep breaths.  Also B/L LE edema.  They have started him on lasix 40 mg daily and spironolactone 100 mg daily.  Renal function and electrolytes are good.  *   Dilated CBD w/o obvious stones or lesion on ultrasound and MRCP.  Acute ETOH hepatitis suspected with MDF of  37 as stated above.  Elevated blood alcohol, drank liquor 7/28 AM.    *   Thrombocytopenia:  Platelets 44.  *   Hx 2017 saddle PE, Rx Eliquis.  Last dose 7/28 in AM.    *   Chronic Pain  -Needs paracentesis today, which the medicine team is ordering.  I have asked that they check cell count, culture, cytology, and albumin.  Will rule out SBP and calculate SAAG. -Will need prednisolone 40 mg daily for high MDF, acute ETOH hepatitis, if negative for SBP. -:Patient is agreeable to EGD for variceal screening on 7/30. -Trend LFTs, INR, BMP, CBC. -Needs complete ETOH cessation. -Will need Hep A and B vaccinations. -Outpatient colonoscopy for CRC screening. -Will order all other serologies to rule out other concomitant causes of chronic liver  disease.   LOS: 1 day   Princella Pellegrini. Takeyah Wieman  01/28/2020, 11:48 AM

## 2020-01-28 NOTE — H&P (View-Only) (Signed)
° ° ° °[facility] Progress Note ° °CC:  Cirrhosis, elevated LFTs, dilated CBD ° °Subjective:  Feels a little better.  No new complaints.  Wants to eat.  Knows that he has to stop drinking ETOH. ° °Objective:  °Vital signs in last 24 hours: °Temp:  [97.4 °F (36.3 °C)-98.7 °F (37.1 °C)] 97.4 °F (36.3 °C) (07/29 0806) °Pulse Rate:  [57-89] 57 (07/29 0806) °Resp:  [16-23] 19 (07/28 2125) °BP: (109-131)/(60-94) 121/85 (07/29 0806) °SpO2:  [94 %-100 %] 99 % (07/29 0806) °Weight:  [92.3 kg] 92.3 kg (07/29 0500) °  °General:  Alert, Well-developed, in NAD; scleral icterus noted. °Heart:  Regular rate and rhythm; no murmurs °Pulm:  CTAB.  No increased WOB. °Abdomen:  Softly distended.  BS present.  Non-tender. °Extremities:  Trace edema in B/L LEs. °Neurologic:  Alert and oriented x 4;  grossly normal neurologically. °Psych:  Alert and cooperative. Normal mood and affect. ° °Intake/Output from previous day: °07/28 0701 - 07/29 0700 °In: 120 [P.O.:120] °Out: -  ° °Lab Results: °Recent Labs  °  01/26/20 °1632 01/27/20 °1020 01/28/20 °0130  °WBC 4.0 3.9* 4.9  °HGB 13.8 13.7 13.7  °HCT 41.1 40.4 39.0  °PLT 57* 57* 44*  ° °BMET °Recent Labs  °  01/26/20 °1632 01/27/20 °1020 01/28/20 °0130  °NA 141 146* 140  °K 3.5 3.7 3.7  °CL 107 108 103  °CO2 27 27 24  °GLUCOSE 121* 126* 85  °BUN 5* 7 5*  °CREATININE 0.76 0.82 0.72  °CALCIUM 8.3* 8.8* 8.6*  ° °LFT °Recent Labs  °  01/27/20 °1300 01/27/20 °1300 01/28/20 °0130  °PROT 6.4*   < > 6.6  °ALBUMIN 3.2*   < > 3.2*  °AST 173*   < > 142*  °ALT 66*   < > 61*  °ALKPHOS 83   < > 78  °BILITOT 7.0*   < > 8.3*  °BILIDIR 2.0*  --   --   °IBILI 5.0*  --   --   ° < > = values in this interval not displayed.  ° °PT/INR °Recent Labs  °  01/27/20 °1841 01/28/20 °0130  °LABPROT 20.9* 19.7*  °INR 1.9* 1.7*  ° °Hepatitis Panel °Recent Labs  °  01/28/20 °0130  °HEPBSAG NON REACTIVE  °HCVAB NON REACTIVE  °HEPAIGM NON REACTIVE  °HEPBIGM NON REACTIVE  ° ° °DG Chest 2 View ° °Result Date:  01/27/2020 °CLINICAL DATA:  Pt presents with complaints of feeling like he was bloated in his abdomen. Reports it is making him feel short of breath. Pt is having blood in his stool x 2 years. Reports history history of blood clot and that this does not feel the same. EXAM: CHEST - 2 VIEW COMPARISON:  none FINDINGS: Clearance of left lower lobe airspace disease. Relatively low lung volumes. Some increase in coarse interstitial markings in a predominantly perihilar and bibasilar distribution. Heart size and mediastinal contours are within normal limits. No effusion.  No pneumothorax. Cervical fixation hardware noted. IMPRESSION: Resolution of left lower lobe airspace disease. Worsening perihilar and bibasilar interstitial infiltrates or edema. Electronically Signed   By: D  Hassell M.D.   On: 01/27/2020 11:49  ° °US Abdomen Limited ° °Result Date: 01/27/2020 °CLINICAL DATA:  Jaundice.  Abdominal swelling and pain EXAM: ULTRASOUND ABDOMEN LIMITED RIGHT UPPER QUADRANT COMPARISON:  None. FINDINGS: Gallbladder: No gallstones or wall thickening visualized. No sonographic Murphy sign noted by sonographer. Gallbladder is dilated. Common bile duct: Diameter: 9.4 mm.  Common bile duct   is dilated. Liver: Increased echogenicity of the liver diffusely with irregularity of the capsule compatible cirrhosis. There is moderate ascites around the liver. 8 mm cyst in the left lobe of the liver. Portal vein is patent on color Doppler imaging with normal direction of blood flow towards the liver. Other: Moderate ascites. IMPRESSION: Dilated gallbladder without gallstones. Common bile duct dilated at 9.4 mm Changes of  cirrhosis of the liver with moderate ascites. Electronically Signed   By: Charles  Clark M.D.   On: 01/27/2020 14:49  ° °MR ABDOMEN MRCP W WO CONTAST ° °Result Date: 01/28/2020 °CLINICAL DATA:  Right upper quadrant abdominal pain. Biliary ductal dilatation on recent ultrasound. Jaundice. Cirrhosis. EXAM: MRI ABDOMEN WITHOUT  AND WITH CONTRAST (INCLUDING MRCP) TECHNIQUE: Multiplanar multisequence MR imaging of the abdomen was performed both before and after the administration of intravenous contrast. Heavily T2-weighted images of the biliary and pancreatic ducts were obtained, and three-dimensional MRCP images were rendered by post processing. CONTRAST:  8.6mL GADAVIST GADOBUTROL 1 MMOL/ML IV SOLN COMPARISON:  Ultrasound on 01/27/2020 and CT on 07/26/2016 FINDINGS: Lower chest: No acute findings. Hepatobiliary: Mild capsular nodularity is suspicious for cirrhosis. Recanalization of paraumbilical veins is consistent with portal venous hypertension. A sub-cm cyst is seen in the lateral segment of the left lobe, however no liver masses are identified. Moderate to large amount of ascites is seen demonstrated. The gallbladder is distended and shows mild diffuse wall thickening. Mild diffuse biliary ductal dilatation is seen with common bile duct measuring 9 mm in diameter. No evidence of choledocholithiasis or biliary stricture. Pancreas: No mass or inflammatory changes. No evidence of pancreatic ductal dilatation. Spleen:  Within normal limits in size and appearance. Adrenals/Urinary Tract: No masses identified. No evidence of hydronephrosis. Stomach/Bowel: Diffuse small bowel wall thickening and mesenteric edema is seen in addition to ascites, likely due to hypoalbuminemia in the setting of cirrhosis. Vascular/Lymphatic: No pathologically enlarged lymph nodes identified. No abdominal aortic aneurysm. Prominent esophageal varices are seen, as well as varices in the gastrohepatic and gastrosplenic ligaments. Other:  None. Musculoskeletal:  No suspicious bone lesions identified. IMPRESSION: Hepatic cirrhosis and findings of portal venous hypertension, including prominent esophageal varices. No evidence of hepatic neoplasm. Distended gallbladder with diffuse wall thickening, which is nonspecific. This may be secondary to hepatocellular disease  and hypoalbuminemia, although acalculus cholecystitis cannot definitely be excluded. Mild diffuse biliary ductal dilatation, with common bile duct measuring 9 mm. No radiographic evidence of choledocholithiasis or other obstructing etiology. Diffuse small bowel wall thickening and mesenteric edema, likely due to hypoalbuminemia in setting of cirrhosis. Moderate to large amount of ascites. Electronically Signed   By: John A Stahl M.D.   On: 01/28/2020 08:17  ° °Assessment / Plan: °*   Cirrhosis of liver in alcoholic.  New dx/Previous steatosis.  LFTs elevated in the past and viral hepatitis studies negative in 2019.  They suspected due to ETOH at that time.  They recommended Hep A and B vaccines, but it does not look like they were ever given (at least not documented in our system).  MDF is 39. °  °*   Ascites.  Moderate to large volume per imaging. °Pt c/o distention that is causing difficulty taking deep breaths.  Also B/L LE edema.  They have started him on lasix 40 mg daily and spironolactone 100 mg daily.  Renal function and electrolytes are good. °  °*   Dilated CBD w/o obvious stones or lesion on ultrasound and MRCP.  Acute ETOH hepatitis suspected with MDF of   39 as stated above.  Elevated blood alcohol, drank liquor 7/28 AM.   °  °*   Thrombocytopenia:  Platelets 44. °  °*   Hx 2017 saddle PE, Rx Eliquis.  Last dose 7/28 in AM.   °  °*   Chronic Pain ° °-Needs paracentesis today, which the medicine team is ordering.  I have asked that they check cell count, culture, cytology, and albumin.  Will rule out SBP and calculate SAAG. °-Will need prednisolone 40 mg daily for high MDF, acute ETOH hepatitis, if negative for SBP. °-:Patient is agreeable to EGD for variceal screening on 7/30. °-Trend LFTs, INR, BMP, CBC. °-Needs complete ETOH cessation. °-Will need Hep A and B vaccinations. °-Outpatient colonoscopy for CRC screening. °-Will order all other serologies to rule out other concomitant causes of chronic liver  disease. ° ° LOS: 1 day  ° °Djon Tith D. Tayley Mudrick  01/28/2020, 11:48 AM ° ° °

## 2020-01-28 NOTE — Progress Notes (Signed)
Pt found drinking water from the sink. I explained again to patient that he needed to be NPO. He stated that he understood and continued to drink his water because "Everybody needs water".

## 2020-01-28 NOTE — Progress Notes (Signed)
Internal Medicine Attending Note:  Date: 01/28/2020  Patient name: Jared Bass  Medical record number: [id number]  Date of birth: [birth year]   I have seen and evaluated Jared Bass and discussed their care with the Residency Team.   Patient is a 49 yo M with a pmhx of alcohol use disorder, opioid use disorder previously on suboxone, and prior PE in 2017 on Eliquis who presented to the ED with worsening jaundice, abdominal distention, and abdominal pain found to have new decompensated cirrhosis with ascites and LE edema. On work up he was found to be thrombocytopenic with elevated AST / ALT, elevated T bili, elevated INR, and ultrasound which showed ascites with cirrhotic changes of the liver. Also underwent MRCP due to concern for dilated GB and CBD on ultrasound. MRCP showed hepatic cirrhosis with findings of portal venous HTN including prominent esophageal varices. Gallbladder was distended with diffuse, nonspecific wall thickening and mild common bile duct dilation of 18mm. No evidence of choledocholithiasis or other obstruction. GI was consulted and he was started on lasix and aldactone for volume management. Planning for EGD. Paracentesis today.   PMHx, Fam Hx, and/or Soc Hx : Per resident H&P  Vitals:   01/28/20 0806 01/28/20 1529  BP: 121/85 122/77  Pulse: 57 83  Resp:    Temp: (!) 97.4 F (36.3 C) 99 F (37.2 C)  SpO2: 99% 97%   Physical Exam Constitutional: NAD, appears comfortable Cardiovascular: RRR, no murmurs, rubs, or gallops.  Pulmonary/Chest: CTAB, no wheezes, rales, or rhonchi. Abdominal: Soft, non tender, mildly distended  Extremities: Warm and well perfused. Trace to 1+ pitting edema bilaterally  Psychiatric: Normal mood and affect   Assessment and Plan: I have seen and evaluated the patient as outlined above. I agree with the formulated Assessment and Plan as detailed in the residents' note, with the following changes:   1. Decompensated Cirrhosis: Greatly  appreciate GI's assistance. Suspect 2/2 to chronic alcohol use but serologic work up is pending. Planning for EGD at some point. Looks like he was started on propranolol for variceal bleed ppx given results of MRCP. He has tolerated this well. Otherwise, he reports good response to lasix / aldactone. No ins and outs recorded but says his swelling has improved. Our team attempted paracentesis today at bedside, unfortunately we were unsuccessful. Only small fluid window identified on ultrasound. We have consulted IR for image guided para, appreciate assistance. Will send for studies (protein, albumin, LDH, cytology, cell count, gram stain, & culture)  2. Alcohol Use Disorder: On CIWA, scores have remained < 5. Has not required ativan.   3. Hx of PE: Prior saddle embolus in 2017 on chronic Eliquis. This was Bass on admission, last dose yesterday morning at 7am.   4. Hx OUD, Chronic Pain: Denies recent opioid use, has been off subsolv now for a few months. Ok to restart while in the hospital if needed for pain but would discuss with patient first.    [name], MD 7/29/20214:16 PM

## 2020-01-28 NOTE — Progress Notes (Signed)
Paracentesis Procedure Note  Indications:  Ascites  Procedure Details  Informed consent was obtained after explanation of the risks and benefits of the procedure, refer to the consent documentation.  Time-out was performed immediately prior to the procedure.  The head of the bed was placed 30-45 degrees above level and the meniscus of the ascites was evaluated by bedside ultrasound and an adequate area of ascitic fluid was identified in the RLQ.  Local anesthesia with 1 percent lidocaine was introduced subcutaneously then deep to the skin until the parietal peritoneum was anesthetized. A parcentesis needle was introduced into this site until ascitic fluid was encountered.  No ascitic fluid was removed and paracentesis was unable to be completed. The needle was removed with minimal bleeding. A sterile bandage was placed after holding pressure.   Findings: 22mL of ascites fluid was obtained.        Condition:   The patient tolerated the procedure well and remains in the same condition as pre-procedure.  Complications: Unable to draw fluid due to small fluid window; patient tolerated the procedure well.

## 2020-01-28 NOTE — Progress Notes (Addendum)
Subjective: Patient reports doing ok. He reports his vision is doing better. Did not of chronic pain in his back and asked nurses for medicine. No other complaints were noted. Team discussed his diagnosis of cirrhosis and future prognosis with transplant being the only cure. Discussed that sobriety must be maintained for 6 months to be considered as a candidate. Discussed life expectancy is months to a year. We discussed doing a paracentesis to remove fluid later this afternoon and he agreed. Patient had no further questions at this time.   Objective:  Vital signs in last 24 hours: Vitals:   01/27/20 1843 01/27/20 2125 01/28/20 0500 01/28/20 0806  BP: 128/84 127/72  121/85  Pulse: 81 70  57  Resp:  19    Temp: 98.1 F (36.7 C) 98.1 F (36.7 C)  (!) 97.4 F (36.3 C)  TempSrc:  Oral    SpO2: 98% 100%  99%  Weight:   (!) 92.3 kg   Height:       Weight change:   Intake/Output Summary (Last 24 hours) at 01/28/2020 1358 Last data filed at 01/27/2020 2000 Gross per 24 hour  Intake 120 ml  Output --  Net 120 ml   Physical Exam: GEN: NAD, lying in bed, no yellowing of skin was noted on students examination CV: RRR, no murmurs, rubs, or gallops PULM: CTA B, no wheezing, rhonchi or crackles ABD: Soft, +BS, tender to palpation in the RUQ, abdominal distention was noted EXT: +2 bilateral pitting edema from the bilateral lower extremities, 1/2 the shins and down PSYCH: Appropriate and normal mood   Labs:  MRCP: Hepatic cirrhosis w/ findings of portal venous HTN including prominent esophageal varices. Negative for hepatic neoplasm. Dilated bile duct. Moderate to large amount of ascites.  CMP: AST (142), ALT (61), total bili (8.3). Decreases in BUN (5), albumin (3.2) CBC: Thrombocytopenic (44) POC occult blood: Pending BNP: WNL  PT/INR: 19.7/1.7 HIV Ab: Non-reactive  Sars COVID: Negative Acute hepatic panel: Non-reactive ANA, IFA (with reflex), AMA, Anti-SM, alpha-1-antitrypsin,  ceruloplasmin, IgA, tissue TG, IgG, ferritin, iron & TIBC: Pending  Assessment/Plan: Mr. Kehl is a 49 y.o. male who presents to [place] with a PMHx of chronic heavy alcohol use, opioid use disorder, and PE in 2017, presenting with decompensated cirrhosis.  Active Problems:   Cirrhosis (HCC)   Alcohol abuse   Common bile duct dilation   Serum total bilirubin elevated Decompensated cirrhosis. Etiology is likely from alcoholic hepatitis given recent findings. Patient was still hypervolemic on presentation today. U/S and MRCP confirmed cirrhosis with moderate ascites. Findings of portal HTN including prominent esophageal varices were also seen. Maddrey's discriminant function was calculated >32, (37.7) suggesting that patient would benefit from steroids. Child Pugh was child class C (11 points), life expectancy was calculated for 1-3 years and MELD score was at 20 points for a ~19.6% 69-month mortality. Team attempted bedside paracentesis procedure 7/29, unfortunately were unsuccessful. Placed consult order for IR paracentesis procedure. 100:40 ratio of spironolactone:lasix was administered today, will change down to 50:20 mg ratio per GI. Will defer to GI for prednisolone upon results of paracentesis & hepatitis vaccinations. Greatly appreciate GI assistance and further recs.  Plan:  --Continue following closely with GI --Transitioned to 50:20 mg spironolactone:lasix --Awaiting IR paracentesis  --Daily CBC and CMP --Monitor I&Os  Alcohol use disorder. Patient has a chronic history of heavy alcohol use and likely the source of his presentation currently. Team will continue CIWA + Ativan in case this patient begins to  withdrawal. Current CIWA scores have been WNL. Continue monitoring closely.  Plan: --Abstain from alcohol --Continue on CIWA + Ativan --Thiamine, folic acid, and multivitamin  Opioid use disorder. Patient has a history for this and was receiving Zubsolv TID and has not taken for about 5  months. Last visit was in 04/14/19. No opioid use recently, will continue to monitor closely.  Plan: --Follow up outpatient, may benefit from restarting Subutex  Chronic pain. Patient complained of pain today. Team will order 1 lidocaine patch PRN daily and ibuprofen but for a few doses PRN. Will consider resuming suboxone if patient agrees. Plan: --Lidocaine patch PRN daily  --Ibuprofen prn --Consider suboxone   PE. Past history of saddle PE extended to all lobes of both lungs w/ apparent occlusion of many segmental branches on December 13, 2015. Patient had no obvious transient risk factors and no thrombectomy was performed. Did show significant right heart strain in addition to significant family history of blood clotting in both his brother and paternal grandmothe. On f/u visit (07/10/2016) with heme/onc, patient's entire hypercoagulable workup including, Factor V Leiden and the Prothrombin gene mutation, were negative, however was recomended on lifelong treatment of eliquis given the massive size of his initial blood clot and his positive family history. Last dose of eliquis was at 7 AM 7/28, will be held for upcoming IR paracentesis.  Plan: --Resume eliquis when able     LOS: 1 day   Clemetine Marker, Medical Student 01/28/2020, 1:58 PM

## 2020-01-29 ENCOUNTER — Inpatient Hospital Stay (HOSPITAL_COMMUNITY): Payer: Medicare Other

## 2020-01-29 ENCOUNTER — Inpatient Hospital Stay (HOSPITAL_COMMUNITY): Payer: Medicare Other | Admitting: Certified Registered"

## 2020-01-29 ENCOUNTER — Encounter (HOSPITAL_COMMUNITY): Payer: Self-pay | Admitting: Internal Medicine

## 2020-01-29 ENCOUNTER — Encounter (HOSPITAL_COMMUNITY)
Admission: EM | Disposition: A | Discharge status: Home / Self Care | Payer: Self-pay | Source: Home / Self Care | Attending: Internal Medicine

## 2020-01-29 DIAGNOSIS — K7460 Unspecified cirrhosis of liver: Secondary | ICD-10-CM

## 2020-01-29 HISTORY — PX: IR PARACENTESIS: IMG2679

## 2020-01-29 HISTORY — PX: ESOPHAGOGASTRODUODENOSCOPY (EGD) WITH PROPOFOL: SHX5813

## 2020-01-29 LAB — GRAM STAIN

## 2020-01-29 LAB — BODY FLUID CELL COUNT WITH DIFFERENTIAL
Eos, Fluid: 0 %
Lymphs, Fluid: 64 %
Monocyte-Macrophage-Serous Fluid: 34 % — ABNORMAL LOW (ref 50–90)
Neutrophil Count, Fluid: 2 % (ref 0–25)
Total Nucleated Cell Count, Fluid: 230 cu mm (ref 0–1000)

## 2020-01-29 LAB — COMPREHENSIVE METABOLIC PANEL
ALT: 54 U/L — ABNORMAL HIGH (ref 0–44)
AST: 111 U/L — ABNORMAL HIGH (ref 15–41)
Albumin: 3.1 g/dL — ABNORMAL LOW (ref 3.5–5.0)
Alkaline Phosphatase: 77 U/L (ref 38–126)
Anion gap: 9 (ref 5–15)
BUN: 12 mg/dL (ref 6–20)
CO2: 27 mmol/L (ref 22–32)
Calcium: 8.6 mg/dL — ABNORMAL LOW (ref 8.9–10.3)
Chloride: 98 mmol/L (ref 98–111)
Creatinine, Ser: 0.95 mg/dL (ref 0.61–1.24)
GFR calc Af Amer: >60 mL/min (ref >60)
GFR calc non Af Amer: >60 mL/min (ref >60)
Glucose, Bld: 99 mg/dL (ref 70–99)
Potassium: 3.3 mmol/L — ABNORMAL LOW (ref 3.5–5.1)
Sodium: 134 mmol/L — ABNORMAL LOW (ref 135–145)
Total Bilirubin: 12 mg/dL — ABNORMAL HIGH (ref 0.3–1.2)
Total Protein: 6.2 g/dL — ABNORMAL LOW (ref 6.5–8.1)

## 2020-01-29 LAB — CBC
HCT: 37.5 % — ABNORMAL LOW (ref 39.0–52.0)
Hemoglobin: 13.1 g/dL (ref 13.0–17.0)
MCH: 33.8 pg (ref 26.0–34.0)
MCHC: 34.9 g/dL (ref 30.0–36.0)
MCV: 96.6 fL (ref 80.0–100.0)
Platelets: 39 10*3/uL — ABNORMAL LOW (ref 150–400)
RBC: 3.88 MIL/uL — ABNORMAL LOW (ref 4.22–5.81)
RDW: 16 % — ABNORMAL HIGH (ref 11.5–15.5)
WBC: 6.1 10*3/uL (ref 4.0–10.5)
nRBC: 0 % (ref 0.0–0.2)

## 2020-01-29 LAB — PROTEIN, PLEURAL OR PERITONEAL FLUID: Total protein, fluid: <3 g/dL

## 2020-01-29 LAB — GLUCOSE, CAPILLARY
Glucose-Capillary: 87 mg/dL (ref 70–99)
Glucose-Capillary: 134 mg/dL — ABNORMAL HIGH (ref 70–99)

## 2020-01-29 LAB — ANTINUCLEAR ANTIBODIES, IFA: ANA Ab, IFA: NEGATIVE

## 2020-01-29 LAB — IGG: IgG (Immunoglobin G), Serum: 1334 mg/dL (ref 603–1613)

## 2020-01-29 LAB — MITOCHONDRIAL ANTIBODIES: Mitochondrial M2 Ab, IgG: <20 Units (ref 0.0–20.0)

## 2020-01-29 LAB — ALBUMIN, PLEURAL OR PERITONEAL FLUID: Albumin, Fluid: 1 g/dL

## 2020-01-29 LAB — TISSUE TRANSGLUTAMINASE, IGA: Tissue Transglutaminase Ab, IgA: 2 U/mL (ref 0–3)

## 2020-01-29 LAB — IGA: IgA: 637 mg/dL — ABNORMAL HIGH (ref 90–386)

## 2020-01-29 LAB — LACTATE DEHYDROGENASE, PLEURAL OR PERITONEAL FLUID: LD, Fluid: 93 U/L — ABNORMAL HIGH (ref 3–23)

## 2020-01-29 LAB — CERULOPLASMIN: Ceruloplasmin: 9.9 mg/dL — ABNORMAL LOW (ref 16.0–31.0)

## 2020-01-29 LAB — ALPHA-1-ANTITRYPSIN: A-1 Antitrypsin, Ser: 94 mg/dL — ABNORMAL LOW (ref 101–187)

## 2020-01-29 LAB — GLUCOSE, PLEURAL OR PERITONEAL FLUID: Glucose, Fluid: 117 mg/dL

## 2020-01-29 LAB — ANTI-SMOOTH MUSCLE ANTIBODY, IGG: F-Actin IgG: 16 Units (ref 0–19)

## 2020-01-29 SURGERY — ESOPHAGOGASTRODUODENOSCOPY (EGD) WITH PROPOFOL
Anesthesia: General

## 2020-01-29 MED ORDER — PROPOFOL 10 MG/ML IV BOLUS
INTRAVENOUS | Status: DC | PRN
Start: 2020-01-29 — End: 2020-01-29
  Administered 2020-01-29: 20 mg via INTRAVENOUS
  Administered 2020-01-29: 40 mg via INTRAVENOUS
  Administered 2020-01-29 (×2): 20 mg via INTRAVENOUS

## 2020-01-29 MED ORDER — POTASSIUM CHLORIDE 10 MEQ/100ML IV SOLN
10.0000 meq | INTRAVENOUS | Status: AC
  Administered 2020-01-29 (×4): 10 meq via INTRAVENOUS
  Filled 2020-01-29 (×3): qty 100

## 2020-01-29 MED ORDER — POTASSIUM CHLORIDE CRYS ER 20 MEQ PO TBCR: 40.0000 meq | EXTENDED_RELEASE_TABLET | Freq: Two times a day (BID) | ORAL | Status: DC

## 2020-01-29 MED ORDER — PROPOFOL 500 MG/50ML IV EMUL
INTRAVENOUS | Status: DC | PRN
  Administered 2020-01-29: 200 ug/kg/min via INTRAVENOUS

## 2020-01-29 MED ORDER — LIDOCAINE HCL 1 % IJ SOLN
INTRAMUSCULAR | Status: AC
  Filled 2020-01-29: qty 20

## 2020-01-29 MED ORDER — POTASSIUM CHLORIDE 10 MEQ/100ML IV SOLN: 10.0000 meq | INTRAVENOUS | Status: DC

## 2020-01-29 MED ORDER — IBUPROFEN 200 MG PO TABS
400.0000 mg | ORAL_TABLET | Freq: Once | ORAL | Status: AC
  Administered 2020-01-29: 400 mg via ORAL
  Filled 2020-01-29: qty 2

## 2020-01-29 MED ORDER — LACTATED RINGERS IV SOLN: INTRAVENOUS | Status: DC

## 2020-01-29 MED ORDER — LIDOCAINE HCL (PF) 1 % IJ SOLN
INTRAMUSCULAR | Status: DC | PRN
  Administered 2020-01-29: 10 mL

## 2020-01-29 MED ORDER — NADOLOL 20 MG PO TABS
20.0000 mg | ORAL_TABLET | Freq: Every day | ORAL | Status: DC
  Administered 2020-01-29 – 2020-01-31 (×3): 20 mg via ORAL
  Filled 2020-01-29 (×4): qty 1

## 2020-01-29 SURGICAL SUPPLY — 15 items

## 2020-01-29 NOTE — Anesthesia Preprocedure Evaluation (Signed)
Anesthesia Evaluation  Patient identified by MRN, date of birth, ID band Patient awake    Reviewed: Allergy & Precautions, NPO status , Patient's Chart, lab work & pertinent test results  Airway Mallampati: II  TM Distance: >3 FB Neck ROM: Full    Dental  (+) Teeth Intact,    Pulmonary    breath sounds clear to auscultation       Cardiovascular hypertension,  Rhythm:Regular Rate:Normal     Neuro/Psych    GI/Hepatic   Endo/Other    Renal/GU      Musculoskeletal   Abdominal   Peds  Hematology   Anesthesia Other Findings   Reproductive/Obstetrics                             Anesthesia Physical Anesthesia Plan  ASA: III  Anesthesia Plan: General   Post-op Pain Management:    Induction: Intravenous  PONV Risk Score and Plan: Ondansetron and Dexamethasone  Airway Management Planned: Natural Airway and Nasal Cannula  Additional Equipment:   Intra-op Plan:   Post-operative Plan:   Informed Consent: I have reviewed the patients History and Physical, chart, labs and discussed the procedure including the risks, benefits and alternatives for the proposed anesthesia with the patient or authorized representative who has indicated his/her understanding and acceptance.       Plan Discussed with: CRNA and Anesthesiologist  Anesthesia Plan Comments:         Anesthesia Quick Evaluation

## 2020-01-29 NOTE — Anesthesia Procedure Notes (Signed)
Procedure Name: MAC Date/Time: 01/29/2020 1:10 PM Performed by: Amadeo Garnet, CRNA Pre-anesthesia Checklist: Patient identified, Emergency Drugs available, Suction available and Patient being monitored Patient Re-evaluated:Patient Re-evaluated prior to induction Oxygen Delivery Method: Nasal cannula Preoxygenation: Pre-oxygenation with 100% oxygen Induction Type: IV induction Placement Confirmation: positive ETCO2 Dental Injury: Teeth and Oropharynx as per pre-operative assessment

## 2020-01-29 NOTE — Procedures (Signed)
Ultrasound-guided diagnostic and therapeutic paracentesis performed yielding 1.4 liters of amber colored fluid.  Fluid was sent to lab for analysis. No immediate complications. EBL is none.

## 2020-01-29 NOTE — Op Note (Signed)
[facility] Patient Name: Jared Bass Procedure Date : 01/29/2020 MRN: [id number] Attending MD: Justice Britain , MD Date of Birth: 1971/04/18 CSN: [id number] Age: 49 Admit Type: Inpatient Procedure:                Upper GI endoscopy Indications:              Screening procedure, Cirrhosis rule out esophageal                            varices Providers:                Justice Britain, MD, Jeanella Cara, RN,                            Laverda Sorenson, Technician, Silas Flood, CRNA Referring MD:             Gerrit Heck, MD, [facility] Medicines:                Monitored Anesthesia Care Complications:            No immediate complications. Estimated Blood Loss:     Estimated blood loss: none. Procedure:                Pre-Anesthesia Assessment:                           - Prior to the procedure, a History and Physical                            was performed, and patient medications and                            allergies were reviewed. The patient's tolerance of                            previous anesthesia was also reviewed. The risks                            and benefits of the procedure and the sedation                            options and risks were discussed with the patient.                            All questions were answered, and informed consent                            was obtained. Prior Anticoagulants: The patient has                            taken Eliquis (apixaban), last dose was 2 days                            prior to procedure. ASA Grade Assessment: III - A  patient with severe systemic disease. After                            reviewing the risks and benefits, the patient was                            deemed in satisfactory condition to undergo the                            procedure.                           After obtaining informed consent, the endoscope was                             passed under direct vision. Throughout the                            procedure, the patient's blood pressure, pulse, and                            oxygen saturations were monitored continuously. The                            GIF-H190 ([id number]) Olympus gastroscope was                            introduced through the mouth, and advanced to the                            second part of duodenum. The upper GI endoscopy was                            accomplished without difficulty. The patient                            tolerated the procedure. Scope In: Scope Out: Findings:      Grade I, grade II varices were found in the distal esophagus.      No gross lesions were noted in the entire esophagus.      Moderate portal hypertensive gastropathy was found in the gastric body.      Mild inflammation characterized by erythema was found in the gastric       antrum.      No gross lesions were noted in the duodenal bulb, in the first portion       of the duodenum and in the second portion of the duodenum. Impression:               - Grade I and grade II esophageal varices.                           - No gross lesions in esophagus.                           - Portal hypertensive gastropathy.                           -  Gastritis.                           - No gross lesions in the duodenal bulb, in the                            first portion of the duodenum and in the second                            portion of the duodenum. Recommendation:           - The patient will be observed post-procedure,                            until all discharge criteria are met.                           - Return patient to hospital ward for observation.                           - Advance diet as tolerated.                           - Observe patient's clinical course.                           - For primary prevention of variceal bleeding,                            start Nadolol 20 mg daily and titrate to  HR <60 or                            SBP<100. Monitor closely.                           - Send H. pylori IgG and treat if positive                           - The findings and recommendations were discussed                            with the patient.                           - The findings and recommendations were discussed                            with the referring physician. Procedure Code(s):        --- Professional ---                           [phone], Esophagogastroduodenoscopy, flexible,                            transoral; diagnostic, including collection of  specimen(s) by brushing or washing, when performed                            (separate procedure) Diagnosis Code(s):        --- Professional ---                           K74.60, Unspecified cirrhosis of liver                           I85.10, Secondary esophageal varices without                            bleeding                           K76.6, Portal hypertension                           K31.89, Other diseases of stomach and duodenum                           K29.70, Gastritis, unspecified, without bleeding                           Z13.810, Encounter for screening for upper                            gastrointestinal disorder CPT copyright 2019 [employer]. All rights reserved. The codes documented in this report are preliminary and upon coder review may  be revised to meet current compliance requirements. Justice Britain, MD 01/29/2020 1:48:31 PM Number of Addenda: 0

## 2020-01-29 NOTE — Transfer of Care (Signed)
Immediate Anesthesia Transfer of Care Note  Patient: Nicole Kindred  Procedure(s) Performed: ESOPHAGOGASTRODUODENOSCOPY (EGD) WITH PROPOFOL (N/A )  Patient Location: Endoscopy Unit  Anesthesia Type:MAC  Level of Consciousness: awake, alert  and oriented  Airway & Oxygen Therapy: Patient Spontanous Breathing  Post-op Assessment: Report given to RN, Post -op Vital signs reviewed and stable and Patient moving all extremities  Post vital signs: Reviewed and stable  Last Vitals:  Vitals Value Taken Time  BP    Temp    Pulse 74 01/29/20 1327  Resp 17 01/29/20 1327  SpO2 100 % 01/29/20 1327  Vitals shown include unvalidated device data.  Last Pain:  Vitals:   01/29/20 1241  TempSrc: Temporal  PainSc: 5       Patients Stated Pain Goal: 0 (57/84/69 6295)  Complications: No complications documented.

## 2020-01-29 NOTE — Progress Notes (Addendum)
Subjective: Patient was examined at bedside. Patient reports doing a little better today. Also reports some pain in his legs and back, stated that the patch given did not help a lot but that the ibuprofen did help some. Patient was made aware of the upcoming procedures for IR paracentesis and EGD. Patient acknowledged and was still ok for both of them. All questions were addressed by team.    Objective:  Vital signs in last 24 hours: Vitals:   01/28/20 0806 01/28/20 1529 01/28/20 2122 01/29/20 0827  BP: 121/85 122/77 111/74 (!) 145/83  Pulse: 57 83 86 75  Resp:   18 12  Temp: (!) 97.4 F (36.3 C) 99 F (37.2 C) 99 F (37.2 C) 98.5 F (36.9 C)  TempSrc:   Oral   SpO2: 99% 97% 98% 99%  Weight:      Height:       Weight change:   Intake/Output Summary (Last 24 hours) at 01/29/2020 0938 Last data filed at 01/29/2020 0657 Gross per 24 hour  Intake 1065.49 ml  Output 1000 ml  Net 65.49 ml   Physical Exam Constitutional: NAD, appears comfortable Cardiovascular: RRR, no murmurs, rubs, or gallops.  Pulmonary/Chest: CTAB, no wheezes, rales, or rhonchi. Abdominal: Soft, non tender, mildly distended  Extremities: Warm and well perfused. Trace to 1+ pitting edema bilaterally  Psychiatric: Normal mood and affect  Labs:  CMP: Na+(134), K+ (3.3), AST (111), ALT (54), total bili (12.0-has been uptrending), and albumin (3.1) CBC: Thrombocytopenic (39), has been downtrending  Labs ordered per GI ANA + IFA (with reflex): Pending AMA: Pending  Anti-SM: Pending Alpha-1-antitrypsin: Low (94) Ceruloplasmin: Low (9.9) IgA: Elevated (637) Tissue TG: Pending IgG: WNL Ferritin: Elevated (355) Iron: Elevated (217) TIBC: Lower side but still WNL (252) Saturation ratios: Elevated (86) UIBC: WNL (35)     Assessment/Plan:  Active Problems:   Cirrhosis (HCC)   Alcohol abuse   Common bile duct dilation   Serum total bilirubin elevated Decompensated Cirrhosis. Greatly appreciate GI's  assistance. Suspect 2/2 to chronic alcohol use but serologic work up is pending. Looks like he was started on propranolol for variceal bleed ppx given results of MRCP. He has continued to tolerate this well. Upcoming EGD and IR image guided paracentesis is scheduled for today. Will send for studies (protein, albumin, LDH, cytology, cell count, gram stain, & culture) if enough fluid is obtained. Of note, plts have been downtrending since admission and total bili has been up-trending.  Will continue to monitor and following closely with GI. Transitioned down to 50:20 spiro/lasix combo, and will continue to observe. Otherwise, reports good response to lasix / aldactone and swelling has improved.  Plan:  --Continue following closely with GI --Continue 50:20 mg spironolactone:lasix --Awaiting IR paracentesis & EGD --Daily CBC and CMP --Monitor I&Os   Alcohol Use Disorder. On CIWA, scores have remained < 5. Has not required ativan. Continue monitoring until 7/31 when it ends at 5:42 PM.  Plan: --Abstain from alcohol --Continue on CIWA + Ativan --Thiamine, folic acid, and multivitamin held since NPO for upcoming procedure  Hx OUD, Chronic Pain. Denies recent opioid use, has been off subsolv now for a few months. Ok to restart while in the hospital if needed for pain, but would discuss with patient first. Can continue current pain hospital agents since have shown some improvement for patient Plan:  --Continue lidocaine patch & ibuprofen PRN  --Continue voltaren topical gel q.i.d --Can consider suboxone   Hx of PE. PMHx of saddle  PE extended to all lobes of both lungs w/ apparent occlusion of many segmental branches on December 13, 2015. Patient had no obvious transient risk factors and no thrombectomy was performed. Showed significant right heart strain, in addition to significant family history of blood clotting in both his brother and paternal grandmother. On f/u visit (07/10/2016) with heme-onc, patient's  entire hypercoagulable workup including, Factor V Leiden and the Prothrombin gene mutation, were negative, however was recomended on lifelong treatment of eliquis given the massive size of his initial blood clot and his positive family history. Last dose of eliquis was at 7 AM 7/28, will be held for upcoming procedures.  Plan: --Resume eliquis when able      LOS: 2 days   Clemetine Marker, Medical Student 01/29/2020, 9:38 AM

## 2020-01-29 NOTE — Interval H&P Note (Signed)
History and Physical Interval Note:  01/29/2020 1:03 PM  Jared Bass  has presented today for surgery, with the diagnosis of Cirrhosis.  The various methods of treatment have been discussed with the patient and family. After consideration of risks, benefits and other options for treatment, the patient has consented to  Procedure(s): ESOPHAGOGASTRODUODENOSCOPY (EGD) WITH PROPOFOL (N/A) as a surgical intervention.  The patient's history has been reviewed, patient examined, no change in status, stable for surgery.  I have reviewed the patient's chart and labs.  Questions were answered to the patient's satisfaction.    Discussed case with Dr. Barron Alvine.  He or I will perform procedure.  EVL band ligation could be considered based on findings.  Possible sideviewing evaluation of the ampulla to be considered as well for Bile duct dilation.   [employer]

## 2020-01-30 DIAGNOSIS — K3189 Other diseases of stomach and duodenum: Secondary | ICD-10-CM

## 2020-01-30 DIAGNOSIS — I8510 Secondary esophageal varices without bleeding: Secondary | ICD-10-CM

## 2020-01-30 DIAGNOSIS — K766 Portal hypertension: Secondary | ICD-10-CM

## 2020-01-30 DIAGNOSIS — R188 Other ascites: Secondary | ICD-10-CM

## 2020-01-30 LAB — COMPREHENSIVE METABOLIC PANEL
ALT: 55 U/L — ABNORMAL HIGH (ref 0–44)
AST: 102 U/L — ABNORMAL HIGH (ref 15–41)
Albumin: 3.3 g/dL — ABNORMAL LOW (ref 3.5–5.0)
Alkaline Phosphatase: 80 U/L (ref 38–126)
Anion gap: 9 (ref 5–15)
BUN: 13 mg/dL (ref 6–20)
CO2: 26 mmol/L (ref 22–32)
Calcium: 9 mg/dL (ref 8.9–10.3)
Chloride: 101 mmol/L (ref 98–111)
Creatinine, Ser: 0.77 mg/dL (ref 0.61–1.24)
GFR calc Af Amer: >60 mL/min (ref >60)
GFR calc non Af Amer: >60 mL/min (ref >60)
Glucose, Bld: 95 mg/dL (ref 70–99)
Potassium: 3.2 mmol/L — ABNORMAL LOW (ref 3.5–5.1)
Sodium: 136 mmol/L (ref 135–145)
Total Bilirubin: 13.3 mg/dL — ABNORMAL HIGH (ref 0.3–1.2)
Total Protein: 6.6 g/dL (ref 6.5–8.1)

## 2020-01-30 LAB — CBC
HCT: 38.5 % — ABNORMAL LOW (ref 39.0–52.0)
Hemoglobin: 13.3 g/dL (ref 13.0–17.0)
MCH: 33.9 pg (ref 26.0–34.0)
MCHC: 34.5 g/dL (ref 30.0–36.0)
MCV: 98.2 fL (ref 80.0–100.0)
Platelets: 36 10*3/uL — ABNORMAL LOW (ref 150–400)
RBC: 3.92 MIL/uL — ABNORMAL LOW (ref 4.22–5.81)
RDW: 16.2 % — ABNORMAL HIGH (ref 11.5–15.5)
WBC: 6.8 10*3/uL (ref 4.0–10.5)
nRBC: 0 % (ref 0.0–0.2)

## 2020-01-30 MED ORDER — KETOROLAC TROMETHAMINE 15 MG/ML IJ SOLN: 15.0000 mg | Freq: Once | INTRAMUSCULAR | Status: DC

## 2020-01-30 MED ORDER — MELOXICAM 7.5 MG PO TABS
7.5000 mg | ORAL_TABLET | Freq: Once | ORAL | Status: AC
  Administered 2020-01-30: 7.5 mg via ORAL
  Filled 2020-01-30: qty 1

## 2020-01-30 MED ORDER — POTASSIUM CHLORIDE CRYS ER 20 MEQ PO TBCR
40.0000 meq | EXTENDED_RELEASE_TABLET | Freq: Once | ORAL | Status: AC
  Administered 2020-01-30: 40 meq via ORAL
  Filled 2020-01-30: qty 2

## 2020-01-30 MED ORDER — PREDNISOLONE 5 MG PO TABS
40.0000 mg | ORAL_TABLET | Freq: Every day | ORAL | Status: DC
  Administered 2020-01-30 – 2020-01-31 (×2): 40 mg via ORAL
  Filled 2020-01-30 (×2): qty 8

## 2020-01-30 NOTE — Social Work (Signed)
CSW consulted for SA counseling. CSW met with patient and introduced self and role. Patient was open to receiving substance abuse resources. CSW went over packet with patient. Patient expressed no additional needs or concerns.   Criss Alvine, [place] Social Worker

## 2020-01-30 NOTE — Progress Notes (Addendum)
   Subjective:  Jared Bass is a 49 y.o. M with PMH of alcohol use disorder admit for decompensated cirrhosis on hospital day 3  JaredBass was examined and evaluated at bedside this am. He was observed resting comfortably in bedside chair. He mentions feeling well. He had some discomfort when taking the diuretics but otherwise mentions improvement in his abdominal distension. He also noted significant improvement in his leg swelling.  Objective:  Vital signs in last 24 hours: Vitals:   01/29/20 1355 01/29/20 1720 01/29/20 2128 01/30/20 0819  BP: 109/69 112/85 114/78 115/75  Pulse: 64 72 76 76  Resp: 15 16  20   Temp:  98.9 F (37.2 C) 98.2 F (36.8 C) 97.9 F (36.6 C)  TempSrc:      SpO2: 97% 99% 99% 100%  Weight:      Height:       Gen: Well-developed, well nourished, NAD HEENT: NCAT head, hearing intact, EOMI, Icteric sclerae CV: RRR, S1, S2 normal, No rubs, no murmurs, no gallops Pulm: CTAB, No rales, no wheezes Abd: Soft, BS+, Distended, No fluid shift, No tenderness, negative Murphy sign Extm: ROM intact, Peripheral pulses intact, No peripheral edema Skin: Dry, Warm, jaundiced Neuro: AAOx3, No asterixis  Assessment/Plan:  Active Problems:   Cirrhosis (HCC)   Alcohol abuse   Common bile duct dilation   Serum total bilirubin elevated  JaredBass is a 49 yo M w/ PMH of alcohol use disorder admit with abdominal distension and jaundice 2/2 alcoholic hepatitis & decompensated cirrhosis  Decompensated Cirrhosis Alcoholic Hepatitis Presented w/ abdominal distension and jaundice with current alcohol use. AST 102 ALT 55 Abdominal ultrasound with dilated bile duct and gall bladder wall thickening without stone. Increased nodularity of liver consistent with cirrhosis. EGD performed yesterday w/ evidence of grade I/II varices. Paracentesis w/ 1.4L transudate. SAAG >2.4. Worsening bilirubinemia T.bili 12->13 suggests concurrent alcoholic hepatitis. Maddrey's DF 48.4points (>30) meets  criteria for steroid therapy - Appreciate GI recs: Start prednisolone - C/w nadolol 20mg  daily - C/w furosemide 20mg , spironolactone 50mg  daily - Trend CMP  Alcohol Use Disorder Drinks fifth of hard liquor daily. Previous hx of DTs. On CIWA since admission with scores <5. No obvious withdrawal symptoms on exam today - C/w CIWA - Thiamine, Folate  Hx of opioid use disorder Previously on buprenorphine but has been able to wean himself off. Denies current opioid use - Avoid opioid medications  Hx of PE Hx of saddle PE with extension to multiple bronchioles on 2017. Advised to take life-long anticoagulation despite negative hypercoagulable work-up. Holding due to current thrombocytopenia and varices. Platelet count 44->39->36 - Holding anticoagulation for now  DVT prophx: SCDs Diet: 2g Bowel: N/A Code: Full  Prior to Admission Living Arrangement: Home Anticipated Discharge Location: Home Barriers to Discharge: Medical work-up Dispo: Anticipated discharge in approximately 1-2 day(s).   , MD 01/30/2020, 10:20 AM Pager: [phone] After 5pm on weekdays and 1pm on weekends: On Call Pager: [phone]

## 2020-01-30 NOTE — Progress Notes (Signed)
[facility] Progress Note  CC:  Elevated LFTs and cirrhosis  Subjective:  Feels much better since paracentesis.  No complaints today.  Objective:  Vital signs in last 24 hours: Temp:  [97.9 F (36.6 C)-99.5 F (37.5 C)] 97.9 F (36.6 C) (07/31 0819) Pulse Rate:  [64-76] 76 (07/31 0819) Resp:  [15-20] 20 (07/31 0819) BP: (109-126)/(64-85) 115/75 (07/31 0819) SpO2:  [96 %-100 %] 100 % (07/31 0819) Last BM Date: 01/29/20 (pt stated) General:  Alert, Well-developed, in NAD; jaundice noted Heart:  Regular rate and rhythm; no murmurs Pulm:  CTAB.  No increased WOB. Abdomen:  Soft, non-distended.  BS present.  Non-tender. Extremities:  Without edema. Neurologic:  Alert and oriented x 4;  grossly normal neurologically. Psych:  Alert and cooperative. Normal mood and affect.  Intake/Output from previous day: 07/30 0701 - 07/31 0700 In: 505.2 [I.V.:250; IV Piggyback:255.2] Out: 1400 [Urine:1400]  Lab Results: Recent Labs    01/28/20 0130 01/29/20 0205 01/30/20 0142  WBC 4.9 6.1 6.8  HGB 13.7 13.1 13.3  HCT 39.0 37.5* 38.5*  PLT 44* 39* 36*   BMET Recent Labs    01/28/20 0130 01/29/20 0205 01/30/20 0142  NA 140 134* 136  K 3.7 3.3* 3.2*  CL 103 98 101  CO2 24 27 26   GLUCOSE 85 99 95  BUN 5* 12 13  CREATININE 0.72 0.95 0.77  CALCIUM 8.6* 8.6* 9.0   LFT Recent Labs    01/27/20 1300 01/28/20 0130 01/30/20 0142  PROT 6.4*   < > 6.6  ALBUMIN 3.2*   < > 3.3*  AST 173*   < > 102*  ALT 66*   < > 55*  ALKPHOS 83   < > 80  BILITOT 7.0*   < > 13.3*  BILIDIR 2.0*  --   --   IBILI 5.0*  --   --    < > = values in this interval not displayed.   PT/INR Recent Labs    01/27/20 1841 01/28/20 0130  LABPROT 20.9* 19.7*  INR 1.9* 1.7*   Hepatitis Panel Recent Labs    01/28/20 0130  HEPBSAG NON REACTIVE  HCVAB NON REACTIVE  HEPAIGM NON REACTIVE  HEPBIGM NON REACTIVE    IR Paracentesis  Result Date: 01/29/2020 INDICATION: Patient with  history of cirrhosis with ascites. Request is for therapeutic and diagnostic paracentesis EXAM: ULTRASOUND GUIDED THERAPEUTIC AND DIAGNOSTIC PARACENTESIS MEDICATIONS: Lidocaine 1% 10 mL COMPLICATIONS: None immediate. PROCEDURE: Informed written consent was obtained from the patient after a discussion of the risks, benefits and alternatives to treatment. A timeout was performed prior to the initiation of the procedure. Initial ultrasound scanning demonstrates a small amount of ascites within the right lower abdominal quadrant. The right lower abdomen was prepped and draped in the usual sterile fashion. 1% lidocaine was used for local anesthesia. Following this, a 19 gauge, 10-cm, Yueh catheter was introduced. An ultrasound image was saved for documentation purposes. The paracentesis was performed. The catheter was removed and a dressing was applied. The patient tolerated the procedure well without immediate post procedural complication. FINDINGS: A total of approximately 1.4 L of amber colored fluid was removed. Samples were sent to the laboratory as requested by the clinical team. IMPRESSION: Successful ultrasound-guided therapeutic and diagnostic paracentesis yielding 1.4 liters of peritoneal fluid. Read by: 01/31/2020, NP Electronically Signed   By: Anders Grant M.D.   On: 01/29/2020 15:30   Assessment / Plan: *Cirrhosis of liver in alcoholic. New dx/Previous  steatosis. LFTs elevated in the past and viral hepatitis studies negative in 2019. They suspected due to ETOH at that time. They recommended Hep A and B vaccines, but it does not look like they were ever given (at least not documented in our system).  MDF is 39 initially.  Total bili up to 13 now.  Ceruloplasmin low and alpha 1 antitrypsin low.  Ferritin and iron sat high, but that could be due to ETOH.  * Ascites. Moderate to large volume per imaging. Pt c/o distention that is causing difficulty taking deep breaths. Also B/L LE  edema.  They have started him on lasix 40 mg daily and spironolactone 100 mg daily.  Renal function and electrolytes are good except slightly low K+ today at 3.2.  Fluid negative for SBP.  SAAG 2.1 c/w portal hypertension.  *Dilated CBD w/o obvious stones or lesion on ultrasound and MRCP. Acute ETOH hepatitis suspected with MDF of 39 as stated above.  * Thrombocytopenia: Platelets 36.  * Hx 2017 saddle PE, Rx Eliquis.  * Chronic Pain  *   ETOH abuse:  Plans to stop.  -Prednisolone 40 mg daily started today for high MDF, acute ETOH hepatitis. -Trend LFTs, INR, BMP, CBC. -Needs complete ETOH cessation. -Will need Hep A and B vaccinations. -Outpatient colonoscopy for CRC screening. -Will check 24 hour urine copper and alpha 1 antitrypsin phenotype. -Will recheck ferritin and iron sat as outpatient in several weeks if he stops drinking ETOH.   LOS: 3 days   Princella Pellegrini. Shemia Bevel  01/30/2020, 11:24 AM

## 2020-01-31 LAB — COMPREHENSIVE METABOLIC PANEL
ALT: 52 U/L — ABNORMAL HIGH (ref 0–44)
AST: 82 U/L — ABNORMAL HIGH (ref 15–41)
Albumin: 3.3 g/dL — ABNORMAL LOW (ref 3.5–5.0)
Alkaline Phosphatase: 88 U/L (ref 38–126)
Anion gap: 8 (ref 5–15)
BUN: 9 mg/dL (ref 6–20)
CO2: 23 mmol/L (ref 22–32)
Calcium: 9.2 mg/dL (ref 8.9–10.3)
Chloride: 104 mmol/L (ref 98–111)
Creatinine, Ser: 0.7 mg/dL (ref 0.61–1.24)
GFR calc Af Amer: >60 mL/min (ref >60)
GFR calc non Af Amer: >60 mL/min (ref >60)
Glucose, Bld: 132 mg/dL — ABNORMAL HIGH (ref 70–99)
Potassium: 4 mmol/L (ref 3.5–5.1)
Sodium: 135 mmol/L (ref 135–145)
Total Bilirubin: 11.3 mg/dL — ABNORMAL HIGH (ref 0.3–1.2)
Total Protein: 6.7 g/dL (ref 6.5–8.1)

## 2020-01-31 LAB — CBC
HCT: 38.6 % — ABNORMAL LOW (ref 39.0–52.0)
Hemoglobin: 13.1 g/dL (ref 13.0–17.0)
MCH: 32.9 pg (ref 26.0–34.0)
MCHC: 33.9 g/dL (ref 30.0–36.0)
MCV: 97 fL (ref 80.0–100.0)
Platelets: 33 10*3/uL — ABNORMAL LOW (ref 150–400)
RBC: 3.98 MIL/uL — ABNORMAL LOW (ref 4.22–5.81)
RDW: 15.9 % — ABNORMAL HIGH (ref 11.5–15.5)
WBC: 6.1 10*3/uL (ref 4.0–10.5)
nRBC: 0 % (ref 0.0–0.2)

## 2020-01-31 LAB — PROTIME-INR
INR: 1.9 — ABNORMAL HIGH (ref 0.8–1.2)
Prothrombin Time: 21.3 seconds — ABNORMAL HIGH (ref 11.4–15.2)

## 2020-01-31 MED ORDER — CARVEDILOL 6.25 MG PO TABS: 6.2500 mg | ORAL_TABLET | Freq: Two times a day (BID) | ORAL | 0 refills | Status: DC

## 2020-01-31 MED ORDER — FUROSEMIDE 20 MG PO TABS: 20.0000 mg | ORAL_TABLET | Freq: Every day | ORAL | 0 refills | Status: DC

## 2020-01-31 MED ORDER — PREDNISOLONE 5 MG PO TABS: 40.0000 mg | ORAL_TABLET | Freq: Every day | ORAL | 0 refills | Status: DC

## 2020-01-31 MED ORDER — SPIRONOLACTONE 50 MG PO TABS: 50.0000 mg | ORAL_TABLET | Freq: Every day | ORAL | 0 refills | Status: DC

## 2020-01-31 MED ORDER — ACAMPROSATE CALCIUM 333 MG PO TBEC: 666.0000 mg | DELAYED_RELEASE_TABLET | Freq: Three times a day (TID) | ORAL | 0 refills | Status: DC

## 2020-01-31 NOTE — Discharge Instructions (Signed)
Ascites  Ascites is a collection of too much fluid in the abdomen. Ascites can range from mild to severe. If ascites is not treated, it can get worse. What are the causes? This condition may be caused by:  A liver condition called cirrhosis. This is the most common cause of ascites.  Long-term (chronic) or alcoholic hepatitis.  Infection or inflammation in the abdomen.  Cancer in the abdomen.  Heart failure.  Kidney disease.  Inflammation of the pancreas.  Clots in the veins of the liver. What are the signs or symptoms? Symptoms of this condition include:  A feeling of fullness in the abdomen. This is common.  An increase in the size of the abdomen or waist.  Swelling in the legs.  Swelling of the scrotum (in men).  Difficulty breathing.  Pain in the abdomen.  Sudden weight gain. If the condition is mild, you may not have symptoms. How is this diagnosed? This condition is diagnosed based on your medical history and a physical exam. Your health care provider may order imaging tests, such as an ultrasound or CT scan of your abdomen. How is this treated? Treatment for this condition depends on the cause of the ascites. It may include:  Taking a pill to make you urinate. This is called a water pill (diuretic pill).  Strictly reducing your salt (sodium) intake. Salt can cause extra fluid to be kept (retained) in the body, and this makes ascites worse.  Having a procedure to remove fluid from your abdomen (paracentesis).  Having a procedure that connects two of the major veins within your liver and relieves pressure on your liver. This is called a TIPS procedure (transjugular intrahepatic portosystemic shunt procedure).  Placement of a drainage catheter (peritoneovenous shunt) to manage the extra fluid in the abdomen. Ascites may go away or improve when the condition that caused it is treated. Follow these instructions at home:  Keep track of your weight. To do this,  weigh yourself at the same time every day and write down your weight.  Keep track of how much you drink and any changes in how much or how often you urinate.  Follow any instructions that your health care provider gives you about how much to drink.  Try not to eat salty (high-sodium) foods.  Take over-the-counter and prescription medicines only as told by your health care provider.  Keep all follow-up visits as told by your health care provider. This is important.  Report any changes in your health to your health care provider, especially if you develop new symptoms or your symptoms get worse. Contact a health care provider if:  You gain more than 3 lb (1.36 kg) in 3 days.  Your waist size increases.  You have new swelling in your legs.  The swelling in your legs gets worse. Get help right away if:  You have a fever.  You are confused.  You have new or worsening breathing trouble.  You have new or worsening pain in your abdomen.  You have new or worsening swelling in the scrotum (in men). Summary  Ascites is a collection of too much fluid in the abdomen.  Ascites may be caused by various conditions, such as cirrhosis, hepatitis, cancer, or congestive heart failure.  Symptoms may include swelling of the abdomen and other areas due to extra fluid in the body.  Treatments may involve dietary changes, medicines, or procedures. This information is not intended to replace advice given to you by your health care   provider. Make sure you discuss any questions you have with your health care provider. Document Revised: 05/20/2018 Document Reviewed: 02/28/2017 Elsevier Patient Education  2020 [employer]  

## 2020-01-31 NOTE — Discharge Summary (Signed)
Name: Jared Bass MRN: [id number] DOB: [birth year] 49 y.o. PCP: Sherrie Mustache, MD  Date of Admission: 01/27/2020  9:58 AM Date of Discharge: 01/31/2020 Attending Physician: Erlinda Hong MD  Discharge Diagnosis: 1. Alcoholic Hepatitis 2. Decompensated cirrhosis 3. Alcohol use disorder  Discharge Medications: Allergies as of 01/31/2020      Reactions   Tramadol Anaphylaxis, Rash   Other Other (See Comments)   Rembrant Whitening strips "fat lips"      Medication List    STOP taking these medications   apixaban 5 MG Tabs tablet Commonly known as: ELIQUIS   chlordiazePOXIDE 25 MG capsule Commonly known as: LIBRIUM   gabapentin 300 MG capsule Commonly known as: NEURONTIN   propranolol 10 MG tablet Commonly known as: INDERAL   Zubsolv 5.7-1.4 MG Subl Generic drug: Buprenorphine HCl-Naloxone HCl     TAKE these medications   acamprosate 333 MG tablet Commonly known as: CAMPRAL Take 2 tablets (666 mg total) by mouth 3 (three) times daily with meals.   albuterol 108 (90 Base) MCG/ACT inhaler Commonly known as: VENTOLIN HFA INHALE 2 PUFFS INTO THE LUNGS EVERY 6 HOURS AS NEEDED (COUGH). What changed: See the new instructions.   carvedilol 6.25 MG tablet Commonly known as: COREG Take 1 tablet (6.25 mg total) by mouth 2 (two) times daily with a meal. Start taking on: February 01, 2020   furosemide 20 MG tablet Commonly known as: LASIX Take 1 tablet (20 mg total) by mouth daily. Start taking on: February 01, 2020   MULTI + OMEGA-3 ADULT GUMMIES PO Take 1 tablet by mouth daily.   polyethylene glycol 17 g packet Commonly known as: MIRALAX / GLYCOLAX Take 17 g by mouth daily. What changed:   when to take this  reasons to take this   prednisoLONE 5 MG Tabs tablet Take 8 tablets (40 mg total) by mouth daily. After 28 days, reduce dose down by 10mg  (2 tablets) every 4 days until finishing course   spironolactone 50 MG tablet Commonly known as: ALDACTONE Take 1  tablet (50 mg total) by mouth daily. Start taking on: February 01, 2020       Disposition and follow-up:   Jared Bass was discharged from [facility] in Stable condition.  At the hospital follow up visit please address:  1. Alcoholic Hepatitis - Check CMP after 7 days of treatment to assess for efficacy - Ensure he takes his prednisolone dose as prescribed and follow taper down to d/c  2. Decompensated cirrhosis - Assess volume status and adjust diuretic regimen as needed - Ensure f/u with GI  3. Alcohol use disorder - Did not require ativan during hospitalization - Started on acamprosate at discharge - Continue to encourage alcohol cessation  2.  Labs / imaging needed at time of follow-up: cbc, cmp, pt/inr  3.  Pending labs/ test needing follow-up: H.pylori antibody, Alpha-1 antitrypsin, Urine Copper, Body fluid culture  Follow-up Appointments:  Follow-up Information    [facility]. Call.   Why: We will call you for an appointment Contact information: [address] [place] [address]             Hospital Course by problem list: 1. Alcoholic Hepatitis: [name] is a 49 yo M w/ PMH of alcohol use disorder presenting to [facility] w/ lower extremity / abdominal swelling and jaundice. He was noted to have elevated LFTs, bilirubin and findings of cirrhosis and ascites on imaging. While he did  have dilated common bile duct (0.94cm) and thickened gall bladder, he was not found to have gall stones or cholecystitis. GI was consulted who performed an EGD showing grade 1/2 varices and evidence of gastritis. SBP was ruled out via paracentesis and he was started on prednisolone with improvement in his bilirubin. Discharged w/ recommendation to c/w prednisolone and f/u with his PCP and GI  2. Decompensated cirrhosis: On admission, noted to have ascites and liver nodularity suggesting cirrhosis on imaging. Paracentesis was  performed with 1.4L of transudative fluid w/o evidence of SBP. Further work-up for cause of cirrhosis was performed per GI and patient was discharged prior to completion of work-up with recommendation to f/u with outpatient GI/PCP to follow up results.  3. Alcohol use disorder: On admission, described significant alcohol use at home. Was placed on CIWA protocol with ativan during admission. Noted to have no significant withdrawal symptoms and did not receive any ativan. He expressed desire to pursue alcohol cessation and was discharged on acamprosate.  Discharge Vitals:   BP 117/84 (BP Location: Right Arm)   Pulse 74   Temp 98.6 F (37 C)   Resp 20   Ht 5\' 8"  (1.727 m)   Wt (!) 92.3 kg   SpO2 100%   BMI 30.94 kg/m   Pertinent Labs, Studies, and Procedures:  CMP Latest Ref Rng & Units 01/31/2020 01/30/2020 01/29/2020  Glucose 70 - 99 mg/dL 01/31/2020) 95 99  BUN 6 - 20 mg/dL 9 13 12   Creatinine 0.61 - 1.24 mg/dL 559(R 4.16  Sodium 135 - 145 mmol/L 135 136 134(L)  Potassium 3.5 - 5.1 mmol/L 4.0 3.2(L) 3.3(L)  Chloride 98 - 111 mmol/L 104 101 98  CO2 22 - 32 mmol/L 23 26 27   Calcium 8.9 - 10.3 mg/dL 9.2 9.0 3.84)  Total Protein 6.5 - 8.1 g/dL 6.7 6.6 6.2(L)  Total Bilirubin 0.3 - 1.2 mg/dL 11.3(H) 13.3(H) 12.0(H)  Alkaline Phos 38 - 126 U/L 88 80 77  AST 15 - 41 U/L 82(H) 102(H) 111(H)  ALT 0 - 44 U/L 52(H) 55(H) 54(H)   ULTRASOUND ABDOMEN LIMITED RIGHT UPPER QUADRANT FINDINGS: Gallbladder:  No gallstones or wall thickening visualized. No sonographic Murphy sign noted by sonographer. Gallbladder is dilated.  Common bile duct:  Diameter: 9.4 mm.  Common bile duct is dilated.  Liver:  Increased echogenicity of the liver diffusely with irregularity of the capsule compatible cirrhosis. There is moderate ascites around the liver. 8 mm cyst in the left lobe of the liver. Portal vein is patent on color Doppler imaging with normal direction of blood flow towards the  liver.  Other: Moderate ascites.  IMPRESSION: Dilated gallbladder without gallstones. Common bile duct dilated at 9.4 mm  Changes of  cirrhosis of the liver with moderate ascites.  MRI ABDOMEN WITHOUT AND WITH CONTRAST (INCLUDING MRCP) FINDINGS: Lower chest: No acute findings.  Hepatobiliary: Mild capsular nodularity is suspicious for cirrhosis. Recanalization of paraumbilical veins is consistent with portal venous hypertension. A sub-cm cyst is seen in the lateral segment of the left lobe, however no liver masses are identified.  Moderate to large amount of ascites is seen demonstrated. The gallbladder is distended and shows mild diffuse wall thickening. Mild diffuse biliary ductal dilatation is seen with common bile duct measuring 9 mm in diameter. No evidence of choledocholithiasis or biliary stricture.  Pancreas: No mass or inflammatory changes. No evidence of pancreatic ductal dilatation.  Spleen:  Within normal limits in size and appearance.  Adrenals/Urinary Tract: No  masses identified. No evidence of hydronephrosis.  Stomach/Bowel: Diffuse small bowel wall thickening and mesenteric edema is seen in addition to ascites, likely due to hypoalbuminemia in the setting of cirrhosis.  Vascular/Lymphatic: No pathologically enlarged lymph nodes identified. No abdominal aortic aneurysm. Prominent esophageal varices are seen, as well as varices in the gastrohepatic and gastrosplenic ligaments.  Other:  None.  Musculoskeletal:  No suspicious bone lesions identified.  IMPRESSION: Hepatic cirrhosis and findings of portal venous hypertension, including prominent esophageal varices. No evidence of hepatic neoplasm.  Distended gallbladder with diffuse wall thickening, which is nonspecific. This may be secondary to hepatocellular disease and hypoalbuminemia, although acalculus cholecystitis cannot definitely be excluded.  Mild diffuse biliary ductal  dilatation, with common bile duct measuring 9 mm. No radiographic evidence of choledocholithiasis or other obstructing etiology.  Diffuse small bowel wall thickening and mesenteric edema, likely due to hypoalbuminemia in setting of cirrhosis.  Moderate to large amount of ascites.  Discharge Instructions: Discharge Instructions    Diet - low sodium heart healthy   Complete by: As directed    Discharge instructions   Complete by: As directed    Dear Jared Bass  You came to [place] with abdominal distension. We have determined this was caused by alcoholic hepatitis and cirrhosis. Here are our recommendations for you at discharge:  Please take prednisolone at 40mg  (8 tablets) daily for 28 days Then reduce dose to 30mg  (6 tablets) daily for 4 days Then reduce dose to 20mg  (4 tablets) daily for 4 days Then reduce dose to 10mg  (2 tablets) daily for 3 days Then reduce dose to 5mg  (1 tablet) daily for 3 days and then stop  Please start acamprosate 666mg  three times daily with meals for alcohol cravings Please take furosemide 20mg  daily Please take spironolactone 50mg  daily  Please make sure to follow up with the internal medicine clinic to check your blood work  Thank you for choosing [facility]   Increase activity slowly   Complete by: As directed       Signed: , MD 02/01/2020, 12:50 PM Pager: [phone] After 5pm on weekdays and 1pm on weekends: On Call Pager: [phone]

## 2020-01-31 NOTE — Progress Notes (Addendum)
Subjective: Jared Bass is a 49 y.o. M with PMH of alcohol use disorder admit for decompensated cirrhosis on hospital day 4.  No overnight events. Patient had trouble sleeping, but this is a chronic problem he states. Patient states he is ready to go home. Patient says he is willling to go to AA, and will plan to use resources from yesterday's talk with social work.   Objective:  Vital signs in last 24 hours: Vitals:   01/30/20 0819 01/30/20 1650 01/30/20 2244 01/31/20 0757  BP: 115/75 (!) 125/90 110/72 117/84  Pulse: 76 78 69 74  Resp: 20 20 18 20   Temp: 97.9 F (36.6 C) 98.4 F (36.9 C) 98.6 F (37 C) 98.6 F (37 C)  TempSrc:   Oral   SpO2: 100% 100% 100% 100%  Weight:      Height:       Weight change:  No intake or output data in the 24 hours ending 01/31/20 04/01/20 Physical Exam: GEN: NAD, sitting comfortably in chair  CV: RRR, no murmurs, rubs, or gallops PULM: CTA B, no wheezing, rhonchi or crackles ABD: soft, NT/ND, +BS EXT: No edema PSYCH: A+ Ox3, appropriate  Assessment/Plan:  Active Problems:   Cirrhosis (HCC)   Alcohol abuse   Common bile duct dilation   Serum total bilirubin elevated   Ascites   Secondary esophageal varices without bleeding (HCC)   Portal hypertensive gastropathy (HCC) This is hospital day 4 for Jared Bass, a 49 y.o. male w/ PMHx of alcohol use disorder admit with abdominal distension and jaundice 2/2 alcoholic hepatitis & decompensated cirrhosis and recently found grade I/II esophageal varices, portal HTN gastropathy and gastritis.  Decompensated Cirrhosis with likely superimposed alcoholic hepatitis. Presented w/ abdominal distension and jaundice with current alcohol use disorder. Abdominal U/S showed dilated bile duct and gall bladder wall thickening w/o stone. Increased nodularity of liver consistent with cirrhosis. EGD showed evidence of grade I/II varices. Paracentesis w/ 1.4L transudate. SAAG >2.4 consistent with portal HTN. LFTs have  improved from yesterday, now AST 82 ALT 52. Improvement in bilirubinemia T.bili 13.3->11.3, though this still is elevated, and suggests concurrent alcoholic hepatitis. Started on prednisilone yesterday for alcoholic hepatitis given elevated discriminant factor. Medically stable for discharge.  Plan:  --Continue prednisolone 40 mg daily --Continue nadolol 20mg  daily --Continue 50:20 mg spironolactone:lasix --Follow up with GI  --Follow up with [facility]   Alcohol Use Disorder. Drinks fifth to a gallon of hard liquor daily. Previous hx of DTs. CIWA ended yesterday but scores remained <5. Planning to start acamprosate given previous side effects with naltrexone therapy, and will follow up in the [facility].  Plan:  --Thiamine, Folate --Start acamprosate --Follow up with [facility]  Hx of opioid use disorder. Previously on buprenorphine but has been able to wean himself off. Denies current opioid use. Plan: -- Avoid opioid medications  Hx of PE. History of unprovoked massive saddle PE in 2017 and has been on indefinite anticoagulation with apixaban (Eliquis).  As a complication of his cirrhosis he is now thrombocytopenic with platelets consistently under 50,000. Platelet count continues to downtrend 44->39->36->33. We will continue to hold anticoagulation as long as he remains this degree of thrombocytopenia. Plan:  --Continue holding anticoagulation for now   LOS: 4 days   [facility], Medical Student 01/31/2020, 8:28 AM   Attestation for Student Documentation I personally was present and performed or re-performed the history, physical exam and medical decision-making activities of this service and have verified that the service and findings  are accurately documented in the student's note  Jared Bass is a 49yo M w/ PMH of alcohol use disorder admit for decompensated cirrhosis and alcoholic hepatitis. His work-up for his cirrhosis is complete. Currently on prednisolone, spironolactone and furosemide. Discharge  delayed while collecting 24 hour copper urine. LFTS, Bilirubin all improving. Medical cleared to discharge, will need close f/u with PCP to check labs and restart home meds if appropriate.  -Judeth Cornfield, PGY3 Pager: [phone]

## 2020-02-01 ENCOUNTER — Encounter (HOSPITAL_COMMUNITY): Payer: Self-pay | Admitting: Gastroenterology

## 2020-02-01 LAB — CYTOLOGY - NON PAP

## 2020-02-01 NOTE — Anesthesia Postprocedure Evaluation (Signed)
Anesthesia Post Note  Patient: Jared Bass  Procedure(s) Performed: ESOPHAGOGASTRODUODENOSCOPY (EGD) WITH PROPOFOL (N/A )     Patient location during evaluation: PACU Anesthesia Type: General Level of consciousness: awake and alert Pain management: pain level controlled Vital Signs Assessment: post-procedure vital signs reviewed and stable Respiratory status: spontaneous breathing, nonlabored ventilation, respiratory function stable and patient connected to nasal cannula oxygen Cardiovascular status: stable and blood pressure returned to baseline Postop Assessment: no apparent nausea or vomiting Anesthetic complications: no   No complications documented.  Last Vitals:  Vitals:   01/30/20 2244 01/31/20 0757  BP: 110/72 117/84  Pulse: 69 74  Resp: 18 20  Temp: 37 C 37 C  SpO2: 100% 100%    Last Pain:  Vitals:   01/30/20 2244  TempSrc: Oral  PainSc:                  [name] COKER

## 2020-02-02 LAB — COPPER, URINE - RANDOM OR 24 HOUR
Copper / Creatinine Ratio: 16 ug/g creat (ref 0–49)
Copper, 24H Ur: 14 ug/24 hr (ref 3–35)
Copper, Ur: 24 ug/L
Creatinine(Crt),U: 1.49 g/L (ref 0.30–3.00)
Total Volume: 600

## 2020-02-02 LAB — H. PYLORI ANTIBODY, IGG: H Pylori IgG: 2 Index Value — ABNORMAL HIGH (ref 0.00–0.79)

## 2020-02-02 LAB — ALPHA-1 ANTITRYPSIN PHENOTYPE: A-1 Antitrypsin, Ser: 93 mg/dL — ABNORMAL LOW (ref 101–187)

## 2020-02-03 LAB — CULTURE, BODY FLUID W GRAM STAIN -BOTTLE: Culture: NO GROWTH

## 2020-02-04 ENCOUNTER — Telehealth: Payer: Self-pay

## 2020-02-04 DIAGNOSIS — K7011 Alcoholic hepatitis with ascites: Secondary | ICD-10-CM

## 2020-02-04 MED ORDER — PREDNISOLONE 5 MG PO TABS: 40.0000 mg | ORAL_TABLET | Freq: Every day | ORAL | 0 refills | Status: AC

## 2020-02-04 NOTE — Telephone Encounter (Signed)
Called patient. He was recently admitted to our service with decompensated cirrhosis with superimposed alcoholic hepatitis. Discharged on a prednisolone taper. Unfortunately patient says the pharmacy did not receive our prescription. I have resent the prescription to [facility] on [address]. He has follow up scheduled with [place] on 8/13.

## 2020-02-04 NOTE — Telephone Encounter (Signed)
Please call pt back about prednisoLONE 5 MG TABS tablet.

## 2020-02-05 ENCOUNTER — Telehealth: Payer: Self-pay

## 2020-02-05 ENCOUNTER — Other Ambulatory Visit: Payer: Self-pay

## 2020-02-05 DIAGNOSIS — K7010 Alcoholic hepatitis without ascites: Secondary | ICD-10-CM

## 2020-02-05 DIAGNOSIS — A048 Other specified bacterial intestinal infections: Secondary | ICD-10-CM

## 2020-02-05 MED ORDER — METRONIDAZOLE 250 MG PO TABS
250.0000 mg | ORAL_TABLET | Freq: Four times a day (QID) | ORAL | 0 refills | Status: AC
Start: 2020-02-05 — End: 2020-02-19

## 2020-02-05 MED ORDER — DOXYCYCLINE HYCLATE 100 MG PO CAPS: 100.0000 mg | ORAL_CAPSULE | Freq: Two times a day (BID) | ORAL | 0 refills | Status: AC

## 2020-02-05 MED ORDER — DOXYCYCLINE MONOHYDRATE 100 MG PO TABS: 100.0000 mg | ORAL_TABLET | Freq: Two times a day (BID) | ORAL | 0 refills | Status: DC

## 2020-02-05 NOTE — Telephone Encounter (Signed)
Left message on patients voicemail to return call regarding H pylor infection, medications sent to pharmacy and allergy to those meds, and labs that need to be drawn.

## 2020-02-05 NOTE — Telephone Encounter (Signed)
Spoke with patient regarding positive H pylori.  He states he is not on Protonix. He was advised to pick up medications sent to [facility] today. Also advised to purchase Pepto Bismol tabs and to take 2 tabs four times a day for 14 days.  He states he can not have labs drawn today; he will go on Monday.  Also advised that he will need H pylori stool antigen 4 weeks after completing therapy.  An appointment has been made for 03/21/20. Please advise regarding Protonix. Thanks Malachi Bonds

## 2020-02-09 ENCOUNTER — Telehealth: Payer: Self-pay | Admitting: *Deleted

## 2020-02-09 MED FILL — prednisoLONE SODIUM PHOSPHA: 10 | 30 days supply | Qty: 12 | Fill #0

## 2020-02-10 ENCOUNTER — Other Ambulatory Visit: Payer: Self-pay

## 2020-02-10 ENCOUNTER — Telehealth: Payer: Self-pay

## 2020-02-10 MED ORDER — PANTOPRAZOLE SODIUM 40 MG PO TBEC
40.0000 mg | DELAYED_RELEASE_TABLET | Freq: Two times a day (BID) | ORAL | 0 refills | Status: DC
Start: 2020-02-10 — End: 2020-08-11

## 2020-02-10 NOTE — Progress Notes (Signed)
protonix

## 2020-02-10 NOTE — Telephone Encounter (Signed)
Left message on patients voicemail to pick up Protonix.  To take twice daily for six weeks.  Needs to stop two weeks before repeat stool study.  Patient to return call if he has questions.

## 2020-02-10 NOTE — Telephone Encounter (Signed)
Ok, so provide with Rx for Protonix 40 mg PO BID x6 weeks then will retest for H pylori eradication following that extended high dose PPI course. Thanks.

## 2020-02-12 ENCOUNTER — Encounter: Payer: Medicare Other | Admitting: Student

## 2020-02-22 ENCOUNTER — Other Ambulatory Visit: Payer: Self-pay | Admitting: Internal Medicine

## 2020-02-22 NOTE — Telephone Encounter (Signed)
Pt followed by [facility] clinic-requesting refills for lasix and aldactone.  Will send to attending for review, please advise.Jared Spittle Cassady8/23/20211:51 PM

## 2020-02-25 ENCOUNTER — Encounter: Payer: Medicare Other | Admitting: Student

## 2020-03-04 ENCOUNTER — Telehealth: Payer: Self-pay | Admitting: *Deleted

## 2020-03-04 NOTE — Telephone Encounter (Signed)
Patient is calling requesting Dr. Oswaldo Done to call in Neurontin for him. He is having pain in his legs and burning in his arm.  Dont want to get back on pain meds.

## 2020-03-08 NOTE — Telephone Encounter (Signed)
Called patient, left VM to call clinic back.   Will need to discuss in person before starting new medication.  Please have patient talk to me if he calls back today.   Debe Coder, MD

## 2020-03-21 DIAGNOSIS — M129 Arthropathy, unspecified: Secondary | ICD-10-CM | POA: Diagnosis not present

## 2020-03-21 DIAGNOSIS — Z79899 Other long term (current) drug therapy: Secondary | ICD-10-CM | POA: Diagnosis not present

## 2020-03-21 DIAGNOSIS — E559 Vitamin D deficiency, unspecified: Secondary | ICD-10-CM | POA: Diagnosis not present

## 2020-03-21 DIAGNOSIS — M542 Cervicalgia: Secondary | ICD-10-CM | POA: Diagnosis not present

## 2020-03-21 DIAGNOSIS — G8929 Other chronic pain: Secondary | ICD-10-CM | POA: Diagnosis not present

## 2020-03-21 DIAGNOSIS — M545 Low back pain: Secondary | ICD-10-CM | POA: Diagnosis not present

## 2020-03-21 DIAGNOSIS — F909 Attention-deficit hyperactivity disorder, unspecified type: Secondary | ICD-10-CM | POA: Diagnosis not present

## 2020-03-28 ENCOUNTER — Other Ambulatory Visit: Payer: Self-pay | Admitting: Student in an Organized Health Care Education/Training Program

## 2020-04-04 DIAGNOSIS — I4891 Unspecified atrial fibrillation: Secondary | ICD-10-CM | POA: Diagnosis not present

## 2020-04-04 DIAGNOSIS — Z79899 Other long term (current) drug therapy: Secondary | ICD-10-CM | POA: Diagnosis not present

## 2020-04-04 DIAGNOSIS — D696 Thrombocytopenia, unspecified: Secondary | ICD-10-CM | POA: Diagnosis not present

## 2020-04-04 DIAGNOSIS — M542 Cervicalgia: Secondary | ICD-10-CM | POA: Diagnosis not present

## 2020-04-20 DIAGNOSIS — G4700 Insomnia, unspecified: Secondary | ICD-10-CM | POA: Diagnosis not present

## 2020-04-20 DIAGNOSIS — I4891 Unspecified atrial fibrillation: Secondary | ICD-10-CM | POA: Diagnosis not present

## 2020-04-26 ENCOUNTER — Other Ambulatory Visit: Payer: Self-pay | Admitting: Student in an Organized Health Care Education/Training Program

## 2020-05-05 DIAGNOSIS — M5416 Radiculopathy, lumbar region: Secondary | ICD-10-CM | POA: Diagnosis not present

## 2020-05-05 DIAGNOSIS — M542 Cervicalgia: Secondary | ICD-10-CM | POA: Diagnosis not present

## 2020-05-05 DIAGNOSIS — G8929 Other chronic pain: Secondary | ICD-10-CM | POA: Diagnosis not present

## 2020-05-05 DIAGNOSIS — Z79899 Other long term (current) drug therapy: Secondary | ICD-10-CM | POA: Diagnosis not present

## 2020-05-05 DIAGNOSIS — M546 Pain in thoracic spine: Secondary | ICD-10-CM | POA: Diagnosis not present

## 2020-05-09 DIAGNOSIS — I4891 Unspecified atrial fibrillation: Secondary | ICD-10-CM | POA: Diagnosis not present

## 2020-05-27 ENCOUNTER — Other Ambulatory Visit: Payer: Self-pay | Admitting: Student in an Organized Health Care Education/Training Program

## 2020-05-30 NOTE — Telephone Encounter (Signed)
Historically we have done his medicines. Looks like he has a new PCP listed, but not sure if he has established care with that person yet. If he has a new PCP, they should manage the meds. If not, I am happy to refill.

## 2020-06-03 DIAGNOSIS — D696 Thrombocytopenia, unspecified: Secondary | ICD-10-CM | POA: Diagnosis not present

## 2020-06-03 DIAGNOSIS — Z79899 Other long term (current) drug therapy: Secondary | ICD-10-CM | POA: Diagnosis not present

## 2020-06-03 DIAGNOSIS — M542 Cervicalgia: Secondary | ICD-10-CM | POA: Diagnosis not present

## 2020-06-03 DIAGNOSIS — E559 Vitamin D deficiency, unspecified: Secondary | ICD-10-CM | POA: Diagnosis not present

## 2020-06-03 DIAGNOSIS — M546 Pain in thoracic spine: Secondary | ICD-10-CM | POA: Diagnosis not present

## 2020-06-22 ENCOUNTER — Telehealth: Payer: Self-pay

## 2020-06-22 NOTE — Telephone Encounter (Signed)
Requesting to speak with a nurse about O[facility] appt. Please call pt back.

## 2020-06-22 NOTE — Telephone Encounter (Signed)
Returned call to patient. States it's been a couple years since he was seen in [facility]. He thought he could do it on his own but now knows he needs help. Had been seeing [facility] but wants to reestablish with [facility]. First available appt given for 07/05/2020. Kinnie Feil, BSN, RN-BC

## 2020-06-22 NOTE — Telephone Encounter (Signed)
I would be happy to see Jared Bass again.

## 2020-06-25 ENCOUNTER — Other Ambulatory Visit: Payer: Self-pay | Admitting: Student in an Organized Health Care Education/Training Program

## 2020-06-27 NOTE — Telephone Encounter (Signed)
Next appt scheduled 07/05/20 in [facility].

## 2020-06-27 NOTE — Telephone Encounter (Signed)
PCP needs to take over his medications at next refill.  Thanks

## 2020-07-07 ENCOUNTER — Encounter: Payer: Medicare Other | Admitting: Internal Medicine

## 2020-07-11 ENCOUNTER — Encounter: Payer: Medicare Other | Admitting: Internal Medicine

## 2020-07-30 ENCOUNTER — Other Ambulatory Visit: Payer: Self-pay | Admitting: Internal Medicine

## 2020-08-07 ENCOUNTER — Other Ambulatory Visit: Payer: Self-pay

## 2020-08-07 DIAGNOSIS — K652 Spontaneous bacterial peritonitis: Principal | ICD-10-CM | POA: Diagnosis present

## 2020-08-07 DIAGNOSIS — E876 Hypokalemia: Secondary | ICD-10-CM | POA: Diagnosis present

## 2020-08-07 DIAGNOSIS — Z885 Allergy status to narcotic agent status: Secondary | ICD-10-CM

## 2020-08-07 DIAGNOSIS — I10 Essential (primary) hypertension: Secondary | ICD-10-CM | POA: Diagnosis present

## 2020-08-07 DIAGNOSIS — D689 Coagulation defect, unspecified: Secondary | ICD-10-CM | POA: Diagnosis present

## 2020-08-07 DIAGNOSIS — D72819 Decreased white blood cell count, unspecified: Secondary | ICD-10-CM | POA: Diagnosis present

## 2020-08-07 DIAGNOSIS — Z86711 Personal history of pulmonary embolism: Secondary | ICD-10-CM

## 2020-08-07 DIAGNOSIS — E8809 Other disorders of plasma-protein metabolism, not elsewhere classified: Secondary | ICD-10-CM | POA: Diagnosis present

## 2020-08-07 DIAGNOSIS — D638 Anemia in other chronic diseases classified elsewhere: Secondary | ICD-10-CM | POA: Diagnosis present

## 2020-08-07 DIAGNOSIS — Z20822 Contact with and (suspected) exposure to covid-19: Secondary | ICD-10-CM | POA: Diagnosis present

## 2020-08-07 DIAGNOSIS — K2970 Gastritis, unspecified, without bleeding: Secondary | ICD-10-CM | POA: Diagnosis present

## 2020-08-07 DIAGNOSIS — R1084 Generalized abdominal pain: Secondary | ICD-10-CM | POA: Diagnosis not present

## 2020-08-07 DIAGNOSIS — D696 Thrombocytopenia, unspecified: Secondary | ICD-10-CM | POA: Diagnosis present

## 2020-08-07 DIAGNOSIS — K7031 Alcoholic cirrhosis of liver with ascites: Secondary | ICD-10-CM | POA: Diagnosis present

## 2020-08-07 DIAGNOSIS — G8929 Other chronic pain: Secondary | ICD-10-CM | POA: Diagnosis present

## 2020-08-07 DIAGNOSIS — I4891 Unspecified atrial fibrillation: Secondary | ICD-10-CM | POA: Diagnosis present

## 2020-08-07 DIAGNOSIS — K219 Gastro-esophageal reflux disease without esophagitis: Secondary | ICD-10-CM | POA: Diagnosis present

## 2020-08-07 DIAGNOSIS — M542 Cervicalgia: Secondary | ICD-10-CM | POA: Diagnosis present

## 2020-08-07 DIAGNOSIS — Z87828 Personal history of other (healed) physical injury and trauma: Secondary | ICD-10-CM

## 2020-08-07 DIAGNOSIS — Z8249 Family history of ischemic heart disease and other diseases of the circulatory system: Secondary | ICD-10-CM

## 2020-08-07 DIAGNOSIS — I8510 Secondary esophageal varices without bleeding: Secondary | ICD-10-CM | POA: Diagnosis present

## 2020-08-07 DIAGNOSIS — Z79899 Other long term (current) drug therapy: Secondary | ICD-10-CM

## 2020-08-07 DIAGNOSIS — F1011 Alcohol abuse, in remission: Secondary | ICD-10-CM | POA: Diagnosis present

## 2020-08-07 DIAGNOSIS — K766 Portal hypertension: Secondary | ICD-10-CM | POA: Diagnosis present

## 2020-08-07 DIAGNOSIS — Z981 Arthrodesis status: Secondary | ICD-10-CM

## 2020-08-07 DIAGNOSIS — K3189 Other diseases of stomach and duodenum: Secondary | ICD-10-CM | POA: Diagnosis present

## 2020-08-08 ENCOUNTER — Emergency Department (HOSPITAL_COMMUNITY): Payer: Medicare Other

## 2020-08-08 ENCOUNTER — Encounter (HOSPITAL_COMMUNITY): Payer: Self-pay | Admitting: Emergency Medicine

## 2020-08-08 ENCOUNTER — Inpatient Hospital Stay (HOSPITAL_COMMUNITY)
Admission: EM | Admit: 2020-08-08 | Discharge: 2020-08-11 | DRG: 372 | Disposition: A | Discharge status: Home / Self Care | Payer: Medicare Other | Attending: Internal Medicine | Admitting: Internal Medicine

## 2020-08-08 DIAGNOSIS — K219 Gastro-esophageal reflux disease without esophagitis: Secondary | ICD-10-CM | POA: Diagnosis present

## 2020-08-08 DIAGNOSIS — D72819 Decreased white blood cell count, unspecified: Secondary | ICD-10-CM | POA: Diagnosis present

## 2020-08-08 DIAGNOSIS — R112 Nausea with vomiting, unspecified: Secondary | ICD-10-CM

## 2020-08-08 DIAGNOSIS — K652 Spontaneous bacterial peritonitis: Secondary | ICD-10-CM | POA: Diagnosis present

## 2020-08-08 DIAGNOSIS — Z86711 Personal history of pulmonary embolism: Secondary | ICD-10-CM | POA: Diagnosis not present

## 2020-08-08 DIAGNOSIS — K7460 Unspecified cirrhosis of liver: Secondary | ICD-10-CM | POA: Diagnosis not present

## 2020-08-08 DIAGNOSIS — R1084 Generalized abdominal pain: Secondary | ICD-10-CM | POA: Diagnosis present

## 2020-08-08 DIAGNOSIS — F1011 Alcohol abuse, in remission: Secondary | ICD-10-CM | POA: Diagnosis present

## 2020-08-08 DIAGNOSIS — I8510 Secondary esophageal varices without bleeding: Secondary | ICD-10-CM | POA: Diagnosis present

## 2020-08-08 DIAGNOSIS — K3189 Other diseases of stomach and duodenum: Secondary | ICD-10-CM | POA: Diagnosis present

## 2020-08-08 DIAGNOSIS — I4891 Unspecified atrial fibrillation: Secondary | ICD-10-CM | POA: Diagnosis present

## 2020-08-08 DIAGNOSIS — K7031 Alcoholic cirrhosis of liver with ascites: Secondary | ICD-10-CM | POA: Diagnosis present

## 2020-08-08 DIAGNOSIS — K766 Portal hypertension: Secondary | ICD-10-CM | POA: Diagnosis present

## 2020-08-08 DIAGNOSIS — Z981 Arthrodesis status: Secondary | ICD-10-CM | POA: Diagnosis not present

## 2020-08-08 DIAGNOSIS — Z885 Allergy status to narcotic agent status: Secondary | ICD-10-CM | POA: Diagnosis not present

## 2020-08-08 DIAGNOSIS — I10 Essential (primary) hypertension: Secondary | ICD-10-CM | POA: Diagnosis present

## 2020-08-08 DIAGNOSIS — D696 Thrombocytopenia, unspecified: Secondary | ICD-10-CM

## 2020-08-08 DIAGNOSIS — K7290 Hepatic failure, unspecified without coma: Secondary | ICD-10-CM | POA: Diagnosis not present

## 2020-08-08 DIAGNOSIS — D689 Coagulation defect, unspecified: Secondary | ICD-10-CM | POA: Diagnosis present

## 2020-08-08 DIAGNOSIS — Z79899 Other long term (current) drug therapy: Secondary | ICD-10-CM | POA: Diagnosis not present

## 2020-08-08 DIAGNOSIS — E8809 Other disorders of plasma-protein metabolism, not elsewhere classified: Secondary | ICD-10-CM | POA: Diagnosis present

## 2020-08-08 DIAGNOSIS — D638 Anemia in other chronic diseases classified elsewhere: Secondary | ICD-10-CM | POA: Diagnosis present

## 2020-08-08 DIAGNOSIS — K2970 Gastritis, unspecified, without bleeding: Secondary | ICD-10-CM | POA: Diagnosis present

## 2020-08-08 DIAGNOSIS — E876 Hypokalemia: Secondary | ICD-10-CM | POA: Diagnosis present

## 2020-08-08 DIAGNOSIS — M542 Cervicalgia: Secondary | ICD-10-CM | POA: Diagnosis present

## 2020-08-08 DIAGNOSIS — Z20822 Contact with and (suspected) exposure to covid-19: Secondary | ICD-10-CM | POA: Diagnosis present

## 2020-08-08 DIAGNOSIS — G8929 Other chronic pain: Secondary | ICD-10-CM | POA: Diagnosis present

## 2020-08-08 DIAGNOSIS — Z87828 Personal history of other (healed) physical injury and trauma: Secondary | ICD-10-CM | POA: Diagnosis not present

## 2020-08-08 LAB — URINALYSIS, ROUTINE W REFLEX MICROSCOPIC
Bacteria, UA: NONE SEEN
Glucose, UA: NEGATIVE mg/dL
Hgb urine dipstick: NEGATIVE
Ketones, ur: 5 mg/dL — AB
Leukocytes,Ua: NEGATIVE
Nitrite: NEGATIVE
Protein, ur: 30 mg/dL — AB
Specific Gravity, Urine: 1.027 (ref 1.005–1.030)
pH: 5 (ref 5.0–8.0)

## 2020-08-08 LAB — CBG MONITORING, ED: Glucose-Capillary: 98 mg/dL (ref 70–99)

## 2020-08-08 LAB — PROTEIN, PLEURAL OR PERITONEAL FLUID: Total protein, fluid: <3 g/dL

## 2020-08-08 LAB — GLUCOSE, PLEURAL OR PERITONEAL FLUID: Glucose, Fluid: 97 mg/dL

## 2020-08-08 LAB — COMPREHENSIVE METABOLIC PANEL
ALT: 39 U/L (ref 0–44)
AST: 58 U/L — ABNORMAL HIGH (ref 15–41)
Albumin: 3 g/dL — ABNORMAL LOW (ref 3.5–5.0)
Alkaline Phosphatase: 149 U/L — ABNORMAL HIGH (ref 38–126)
Anion gap: 10 (ref 5–15)
BUN: 14 mg/dL (ref 6–20)
CO2: 23 mmol/L (ref 22–32)
Calcium: 8.5 mg/dL — ABNORMAL LOW (ref 8.9–10.3)
Chloride: 105 mmol/L (ref 98–111)
Creatinine, Ser: 0.81 mg/dL (ref 0.61–1.24)
GFR, Estimated: >60 mL/min (ref >60)
Glucose, Bld: 112 mg/dL — ABNORMAL HIGH (ref 70–99)
Potassium: 3.6 mmol/L (ref 3.5–5.1)
Sodium: 138 mmol/L (ref 135–145)
Total Bilirubin: 8.7 mg/dL — ABNORMAL HIGH (ref 0.3–1.2)
Total Protein: 6.2 g/dL — ABNORMAL LOW (ref 6.5–8.1)

## 2020-08-08 LAB — PROTIME-INR
INR: 2.1 — ABNORMAL HIGH (ref 0.8–1.2)
Prothrombin Time: 23.2 seconds — ABNORMAL HIGH (ref 11.4–15.2)

## 2020-08-08 LAB — CBC
HCT: 39.1 % (ref 39.0–52.0)
Hemoglobin: 13.5 g/dL (ref 13.0–17.0)
MCH: 32.3 pg (ref 26.0–34.0)
MCHC: 34.5 g/dL (ref 30.0–36.0)
MCV: 93.5 fL (ref 80.0–100.0)
Platelets: 66 10*3/uL — ABNORMAL LOW (ref 150–400)
RBC: 4.18 MIL/uL — ABNORMAL LOW (ref 4.22–5.81)
RDW: 17.6 % — ABNORMAL HIGH (ref 11.5–15.5)
WBC: 3 10*3/uL — ABNORMAL LOW (ref 4.0–10.5)
nRBC: 0 % (ref 0.0–0.2)

## 2020-08-08 LAB — BODY FLUID CELL COUNT WITH DIFFERENTIAL
Lymphs, Fluid: 3 %
Monocyte-Macrophage-Serous Fluid: 16 % — ABNORMAL LOW (ref 50–90)
Neutrophil Count, Fluid: 81 % — ABNORMAL HIGH (ref 0–25)
Total Nucleated Cell Count, Fluid: 16485 cu mm — ABNORMAL HIGH (ref 0–1000)

## 2020-08-08 LAB — PATHOLOGIST SMEAR REVIEW

## 2020-08-08 LAB — ALBUMIN, PLEURAL OR PERITONEAL FLUID: Albumin, Fluid: <1 g/dL

## 2020-08-08 LAB — APTT: aPTT: 39 seconds — ABNORMAL HIGH (ref 24–36)

## 2020-08-08 LAB — LACTATE DEHYDROGENASE, PLEURAL OR PERITONEAL FLUID: LD, Fluid: 73 U/L — ABNORMAL HIGH (ref 3–23)

## 2020-08-08 LAB — LIPASE, BLOOD: Lipase: 58 U/L — ABNORMAL HIGH (ref 11–51)

## 2020-08-08 LAB — SARS CORONAVIRUS 2 BY RT PCR (HOSPITAL ORDER, PERFORMED IN ~~LOC~~ HOSPITAL LAB): SARS Coronavirus 2: NEGATIVE

## 2020-08-08 MED ORDER — FOLIC ACID 1 MG PO TABS
1.0000 mg | ORAL_TABLET | Freq: Every day | ORAL | Status: DC
  Administered 2020-08-09 – 2020-08-11 (×3): 1 mg via ORAL
  Filled 2020-08-08 (×3): qty 1

## 2020-08-08 MED ORDER — SODIUM CHLORIDE 0.9 % IV SOLN
2.0000 g | Freq: Once | INTRAVENOUS | Status: AC
  Administered 2020-08-08: 2 g via INTRAVENOUS
  Filled 2020-08-08: qty 20

## 2020-08-08 MED ORDER — ONDANSETRON HCL 4 MG/2ML IJ SOLN: 4.0000 mg | Freq: Four times a day (QID) | INTRAMUSCULAR | Status: DC | PRN

## 2020-08-08 MED ORDER — ONDANSETRON HCL 4 MG/2ML IJ SOLN
4.0000 mg | Freq: Once | INTRAMUSCULAR | Status: AC
  Administered 2020-08-08: 4 mg via INTRAVENOUS
  Filled 2020-08-08: qty 2

## 2020-08-08 MED ORDER — METRONIDAZOLE 500 MG PO TABS
500.0000 mg | ORAL_TABLET | Freq: Three times a day (TID) | ORAL | Status: DC
  Administered 2020-08-08 – 2020-08-09 (×3): 500 mg via ORAL
  Filled 2020-08-08 (×3): qty 1

## 2020-08-08 MED ORDER — THIAMINE HCL 100 MG PO TABS
100.0000 mg | ORAL_TABLET | Freq: Every day | ORAL | Status: DC
  Administered 2020-08-09 – 2020-08-11 (×3): 100 mg via ORAL
  Filled 2020-08-08 (×3): qty 1

## 2020-08-08 MED ORDER — MORPHINE SULFATE (PF) 4 MG/ML IV SOLN
4.0000 mg | Freq: Once | INTRAVENOUS | Status: AC
Start: 2020-08-08 — End: 2020-08-08
  Administered 2020-08-08: 4 mg via INTRAVENOUS
  Filled 2020-08-08: qty 1

## 2020-08-08 MED ORDER — MORPHINE SULFATE (PF) 4 MG/ML IV SOLN
4.0000 mg | Freq: Once | INTRAVENOUS | Status: AC
  Administered 2020-08-08: 4 mg via INTRAVENOUS
  Filled 2020-08-08: qty 1

## 2020-08-08 MED ORDER — SPIRONOLACTONE 25 MG PO TABS
50.0000 mg | ORAL_TABLET | Freq: Every day | ORAL | Status: DC
  Administered 2020-08-09 – 2020-08-11 (×3): 50 mg via ORAL
  Filled 2020-08-08 (×3): qty 2

## 2020-08-08 MED ORDER — FENTANYL CITRATE (PF) 100 MCG/2ML IJ SOLN
50.0000 ug | Freq: Once | INTRAMUSCULAR | Status: AC
  Administered 2020-08-08: 50 ug via INTRAVENOUS
  Filled 2020-08-08: qty 2

## 2020-08-08 MED ORDER — LIDOCAINE HCL 1 % IJ SOLN
INTRAMUSCULAR | Status: AC
  Filled 2020-08-08: qty 20

## 2020-08-08 MED ORDER — ACETAMINOPHEN 650 MG RE SUPP: 650.0000 mg | Freq: Four times a day (QID) | RECTAL | Status: DC | PRN

## 2020-08-08 MED ORDER — ONDANSETRON HCL 4 MG PO TABS: 4.0000 mg | ORAL_TABLET | Freq: Four times a day (QID) | ORAL | Status: DC | PRN

## 2020-08-08 MED ORDER — GABAPENTIN 300 MG PO CAPS: 300.0000 mg | ORAL_CAPSULE | Freq: Three times a day (TID) | ORAL | Status: DC | PRN

## 2020-08-08 MED ORDER — FENTANYL CITRATE (PF) 100 MCG/2ML IJ SOLN
50.0000 ug | Freq: Once | INTRAMUSCULAR | Status: AC
Start: 2020-08-08 — End: 2020-08-08
  Administered 2020-08-08: 50 ug via INTRAVENOUS
  Filled 2020-08-08: qty 2

## 2020-08-08 MED ORDER — ALBUTEROL SULFATE HFA 108 (90 BASE) MCG/ACT IN AERS
2.0000 | INHALATION_SPRAY | Freq: Four times a day (QID) | RESPIRATORY_TRACT | Status: DC | PRN
  Filled 2020-08-08: qty 6.7

## 2020-08-08 MED ORDER — ACETAMINOPHEN 325 MG PO TABS: 650.0000 mg | ORAL_TABLET | Freq: Four times a day (QID) | ORAL | Status: DC | PRN

## 2020-08-08 MED ORDER — ENOXAPARIN SODIUM 40 MG/0.4ML ~~LOC~~ SOLN
40.0000 mg | SUBCUTANEOUS | Status: DC
  Administered 2020-08-09: 40 mg via SUBCUTANEOUS
  Filled 2020-08-08: qty 0.4

## 2020-08-08 MED ORDER — IOHEXOL 300 MG/ML  SOLN
100.0000 mL | Freq: Once | INTRAMUSCULAR | Status: AC | PRN
  Administered 2020-08-08: 100 mL via INTRAVENOUS

## 2020-08-08 MED ORDER — FUROSEMIDE 20 MG PO TABS
20.0000 mg | ORAL_TABLET | Freq: Every day | ORAL | Status: DC
  Administered 2020-08-09 – 2020-08-11 (×3): 20 mg via ORAL
  Filled 2020-08-08 (×3): qty 1

## 2020-08-08 MED ORDER — MORPHINE SULFATE (PF) 4 MG/ML IV SOLN
4.0000 mg | INTRAVENOUS | Status: DC | PRN
  Administered 2020-08-08 – 2020-08-11 (×12): 4 mg via INTRAVENOUS
  Filled 2020-08-08 (×13): qty 1

## 2020-08-08 MED ORDER — ADULT MULTIVITAMIN W/MINERALS CH
1.0000 | ORAL_TABLET | Freq: Every day | ORAL | Status: DC
  Administered 2020-08-09: 1 via ORAL
  Filled 2020-08-08 (×3): qty 1

## 2020-08-08 MED ORDER — CARVEDILOL 6.25 MG PO TABS
6.2500 mg | ORAL_TABLET | Freq: Every day | ORAL | Status: DC
  Administered 2020-08-09 – 2020-08-11 (×3): 6.25 mg via ORAL
  Filled 2020-08-08 (×3): qty 1

## 2020-08-08 NOTE — Plan of Care (Signed)
  Problem: Education: Goal: Knowledge of General Education information will improve Description: Including pain rating scale, medication(s)/side effects and non-pharmacologic comfort measures 08/08/2020 2255 by Janice Norrie, RN Outcome: Progressing 08/08/2020 2255 by Janice Norrie, RN Outcome: Progressing   Problem: Health Behavior/Discharge Planning: Goal: Ability to manage health-related needs will improve 08/08/2020 2255 by Janice Norrie, RN Outcome: Progressing 08/08/2020 2255 by Janice Norrie, RN Outcome: Progressing   Problem: Clinical Measurements: Goal: Ability to maintain clinical measurements within normal limits will improve 08/08/2020 2255 by Janice Norrie, RN Outcome: Progressing 08/08/2020 2255 by Janice Norrie, RN Outcome: Progressing Goal: Will remain free from infection 08/08/2020 2255 by Janice Norrie, RN Outcome: Progressing 08/08/2020 2255 by Janice Norrie, RN Outcome: Progressing Goal: Diagnostic test results will improve 08/08/2020 2255 by Janice Norrie, RN Outcome: Progressing 08/08/2020 2255 by Janice Norrie, RN Outcome: Progressing Goal: Respiratory complications will improve 08/08/2020 2255 by Janice Norrie, RN Outcome: Progressing 08/08/2020 2255 by Janice Norrie, RN Outcome: Progressing Goal: Cardiovascular complication will be avoided 08/08/2020 2255 by Janice Norrie, RN Outcome: Progressing 08/08/2020 2255 by Janice Norrie, RN Outcome: Progressing   Problem: Activity: Goal: Risk for activity intolerance will decrease 08/08/2020 2255 by Janice Norrie, RN Outcome: Progressing 08/08/2020 2255 by Janice Norrie, RN Outcome: Progressing   Problem: Nutrition: Goal: Adequate nutrition will be maintained 08/08/2020 2255 by Janice Norrie, RN Outcome: Progressing 08/08/2020 2255 by Janice Norrie, RN Outcome: Progressing   Problem: Coping: Goal: Level of anxiety will decrease 08/08/2020 2255 by Janice Norrie, RN Outcome:  Progressing 08/08/2020 2255 by Janice Norrie, RN Outcome: Progressing   Problem: Elimination: Goal: Will not experience complications related to bowel motility 08/08/2020 2255 by Janice Norrie, RN Outcome: Progressing 08/08/2020 2255 by Janice Norrie, RN Outcome: Progressing Goal: Will not experience complications related to urinary retention 08/08/2020 2255 by Janice Norrie, RN Outcome: Progressing 08/08/2020 2255 by Janice Norrie, RN Outcome: Progressing   Problem: Pain Managment: Goal: General experience of comfort will improve 08/08/2020 2255 by Janice Norrie, RN Outcome: Progressing 08/08/2020 2255 by Janice Norrie, RN Outcome: Progressing   Problem: Safety: Goal: Ability to remain free from injury will improve 08/08/2020 2255 by Janice Norrie, RN Outcome: Progressing 08/08/2020 2255 by Janice Norrie, RN Outcome: Progressing   Problem: Skin Integrity: Goal: Risk for impaired skin integrity will decrease 08/08/2020 2255 by Janice Norrie, RN Outcome: Progressing 08/08/2020 2255 by Janice Norrie, RN Outcome: Progressing

## 2020-08-08 NOTE — ED Provider Notes (Signed)
[facility]-EMERGENCY DEPT Provider Note   CSN: [id number] Arrival date & time: 08/07/20  2350     History Chief Complaint  Patient presents with  . Abdominal Pain    Jared Bass is a 50 y.o. male.  HPI Patient is a 50 year old male with a past medical history significant for chronic neck pain, alcoholic cirrhosis with esophageal varices without bleeding, alcohol abuse, decompensated liver cirrhosis.  Patient states that 2 days ago he started having worsening abdominal pain he states it was generalized included his entire abdomen.  He states it is sharp and severe.  He states that he has had decreased appetite and states that over the past 2 days he has also had some loose stools.  He states that he feels nauseous and has had several episodes of nonbloody nonbilious emesis.  He denies any fevers or chills.  No lightheadedness or dizziness.  He denies any blood in his stool or any dark or tarry stool.   He states that over this period of time he is also noticed significant swelling in his abdomen.  He states that he has never had pain like this before.  Patient was brought into the hospital in July 2021 for proper quadrant abdominal pain, with concern for biliary disease.  Ended up having MRI of abdomen at that time and was discharged with instructions to follow-up with gastroenterology.  However patient never did follow up with gastroenterology.  He was lost to follow-up.  There are numerous telephone calls made to patient since his discharge last year.  It appears the patient may have had some telemedicine visit with gastroenterologist at [facility] however I do not see any note on this.     Past Medical History:  Diagnosis Date  . Acute renal failure (HCC)   . Acute respiratory failure (HCC)    2017  . Acute respiratory failure with hypoxia (HCC)   . Acute saddle pulmonary embolism with acute cor pulmonale (HCC)   . Chronic neck pain   . Closed fracture of nasal  bone   . DDD (degenerative disc disease), cervical 05/04/2015  . Faintness   . Hemoptysis   . History of atrial fibrillation   . History of head injury 05/04/2015  . History of tachycardia 05/04/2015  . Hypertension   . Hypotension   . Pulmonary emboli (HCC)    on elequis  . Pulmonary embolism (HCC)   . Right eye injury   . Saddle pulmonary embolus (HCC) 12/13/2015  . Syncope and collapse   . Tachycardia 12/20/2015    Patient Active Problem List   Diagnosis Date Noted  . Ascites   . Secondary esophageal varices without bleeding (HCC)   . Portal hypertensive gastropathy (HCC)   . Cirrhosis (HCC) 01/27/2020  . Alcohol abuse   . Common bile duct dilation   . Serum total bilirubin elevated   . Transaminitis 09/10/2017  . Alcohol use disorder, moderate, dependence (HCC) 07/11/2017  . GERD (gastroesophageal reflux disease) 03/12/2017  . Opioid use disorder, moderate, dependence (HCC) 01/22/2017  . Cervical spinal stenosis 05/04/2015  . Cervical foraminal stenosis (right side) 05/04/2015  . Generalized anxiety disorder 05/04/2015    Past Surgical History:  Procedure Laterality Date  . ESOPHAGOGASTRODUODENOSCOPY (EGD) WITH PROPOFOL N/A 01/29/2020   Procedure: ESOPHAGOGASTRODUODENOSCOPY (EGD) WITH PROPOFOL;  Surgeon: Shellia Cleverly, DO;  Location: [facility] ENDOSCOPY;  Service: Gastroenterology;  Laterality: N/A;  . IR PARACENTESIS  01/29/2020  . neck fusion     c5-7  .  TONSILLECTOMY         Family History  Problem Relation Age of Onset  . Hypertension Mother   . COPD Mother   . Mesothelioma Father   . COPD Father   . Peripheral vascular disease Brother        Clots in his arteries surgically removed    Social History   Tobacco Use  . Smoking status: Never Smoker  . Smokeless tobacco: Never Used  Substance Use Topics  . Alcohol use: No    Alcohol/week: 0.0 standard drinks  . Drug use: No    Home Medications Prior to Admission medications   Medication Sig Start Date  End Date Taking? Authorizing Provider  carvedilol (COREG) 6.25 MG tablet TAKE 1 TABLET BY MOUTH 2 TIMES DAILY WITH A MEAL. Patient taking differently: Take 6.25 mg by mouth daily. 06/27/20  Yes Inez CatalinaMullen, Emily B, MD  furosemide (LASIX) 20 MG tablet TAKE 1 TABLET BY MOUTH EVERY DAY 06/27/20  Yes Inez CatalinaMullen, Emily B, MD  gabapentin (NEURONTIN) 300 MG capsule Take 300 mg by mouth 3 (three) times daily. Uses PRN 06/03/20  Yes [provider]  Multiple Vitamins-Minerals (MULTI + OMEGA-3 ADULT GUMMIES PO) Take 1 tablet by mouth daily.   Yes [provider]  spironolactone (ALDACTONE) 50 MG tablet TAKE 1 TABLET BY MOUTH EVERY DAY 06/27/20  Yes Inez CatalinaMullen, Emily B, MD  albuterol (VENTOLIN HFA) 108 (90 Base) MCG/ACT inhaler INHALE 2 PUFFS INTO THE LUNGS EVERY 6 HOURS AS NEEDED (COUGH). Patient taking differently: Inhale 2 puffs into the lungs every 6 (six) hours as needed for wheezing or shortness of breath. 05/04/19   Inez CatalinaMullen, Emily B, MD  pantoprazole (PROTONIX) 40 MG tablet Take 1 tablet (40 mg total) by mouth 2 (two) times daily. Take for six weeks. Stop two weeks before stool study Patient not taking: Reported on 08/08/2020 02/10/20   Cirigliano, Vito V, DO  polyethylene glycol (MIRALAX / GLYCOLAX) packet Take 17 g by mouth daily. Patient taking differently: Take 17 g by mouth daily as needed for mild constipation. 07/30/17   Arnetha CourserAmin, Sumayya, MD    Allergies    Tramadol and Other  Review of Systems   Review of Systems  Constitutional: Positive for appetite change and fatigue. Negative for chills and fever.  HENT: Negative for congestion.   Eyes: Negative for pain.  Respiratory: Negative for cough and shortness of breath.   Cardiovascular: Negative for chest pain and leg swelling.  Gastrointestinal: Positive for abdominal pain, diarrhea ("loose stool" 3-4 times per day), nausea and vomiting.  Genitourinary: Negative for dysuria.  Musculoskeletal: Negative for myalgias.  Skin: Negative for rash.   Neurological: Negative for dizziness and headaches.    Physical Exam Updated Vital Signs BP 138/83   Pulse 97   Temp 98.1 F (36.7 C) (Oral)   Resp 19   Ht 5\' 8"  (1.727 m)   Wt 86.2 kg   SpO2 98%   BMI 28.89 kg/m   Physical Exam Vitals and nursing note reviewed.  Constitutional:      General: He is not in acute distress.    Appearance: He is obese.     Comments: Jaundiced 50 year old male, able answer questions appropriately and follow commands  HENT:     Head: Normocephalic and atraumatic.     Nose: Nose normal.  Eyes:     General: No scleral icterus. Cardiovascular:     Rate and Rhythm: Normal rate and regular rhythm.     Pulses: Normal pulses.  Heart sounds: Normal heart sounds.  Pulmonary:     Effort: Pulmonary effort is normal. No respiratory distress.     Breath sounds: No wheezing.     Comments: Speaking full sentences, no tachypnea, no increased work of breathing. Abdominal:     Palpations: Abdomen is soft.     Tenderness: There is generalized abdominal tenderness.     Comments: Abdomen is mildly distended and generalized tenderness is present.  No focal tenderness. Abdomen is dull to percussion.  Musculoskeletal:     Cervical back: Normal range of motion.     Right lower leg: No edema.     Left lower leg: No edema.  Skin:    General: Skin is warm and dry.     Capillary Refill: Capillary refill takes less than 2 seconds.  Neurological:     Mental Status: He is alert. Mental status is at baseline.  Psychiatric:        Mood and Affect: Mood normal.        Behavior: Behavior normal.     ED Results / Procedures / Treatments   Labs (all labs ordered are listed, but only abnormal results are displayed) Labs Reviewed  LIPASE, BLOOD - Abnormal; Notable for the following components:      Result Value   Lipase 58 (*)    All other components within normal limits  COMPREHENSIVE METABOLIC PANEL - Abnormal; Notable for the following components:    Glucose, Bld 112 (*)    Calcium 8.5 (*)    Total Protein 6.2 (*)    Albumin 3.0 (*)    AST 58 (*)    Alkaline Phosphatase 149 (*)    Total Bilirubin 8.7 (*)    All other components within normal limits  CBC - Abnormal; Notable for the following components:   WBC 3.0 (*)    RBC 4.18 (*)    RDW 17.6 (*)    Platelets 66 (*)    All other components within normal limits  URINALYSIS, ROUTINE W REFLEX MICROSCOPIC - Abnormal; Notable for the following components:   Color, Urine AMBER (*)    Bilirubin Urine SMALL (*)    Ketones, ur 5 (*)    Protein, ur 30 (*)    All other components within normal limits  PROTIME-INR - Abnormal; Notable for the following components:   Prothrombin Time 23.2 (*)    INR 2.1 (*)    All other components within normal limits  APTT - Abnormal; Notable for the following components:   aPTT 39 (*)    All other components within normal limits    EKG None  Radiology CT ABDOMEN PELVIS W CONTRAST  Result Date: 08/08/2020 CLINICAL DATA:  Abdominal distension EXAM: CT ABDOMEN AND PELVIS WITH CONTRAST TECHNIQUE: Multidetector CT imaging of the abdomen and pelvis was performed using the standard protocol following bolus administration of intravenous contrast. CONTRAST:  OMNIPAQUE IOHEXOL 300 MG/ML  SOLN COMPARISON:  07/26/2016 FINDINGS: Lower chest: Bibasilar airspace opacities could reflect atelectasis or infiltrates. Heart is normal size. No effusions. Hepatobiliary: Nodular, shrunken liver compatible with cirrhosis. Scattered hypodensities in the liver, likely cysts. Gallbladder is distended. No biliary ductal dilatation. Pancreas: No focal abnormality or ductal dilatation. Spleen: No focal abnormality.  Normal size. Adrenals/Urinary Tract: No renal or adrenal mass. No hydronephrosis. Urinary bladder decompressed, grossly unremarkable. Stomach/Bowel: There is wall thickening noted within the right colon, transverse colon and proximal descending colon. This could  reflect colitis or be related to hyperproteinemia from liver disease.  Stomach and small bowel decompressed, unremarkable. No bowel obstruction. Vascular/Lymphatic: No evidence of aneurysm or adenopathy. Reproductive: No visible focal abnormality. Other: Large volume ascites in the abdomen and pelvis. Musculoskeletal: No acute bony abnormality. IMPRESSION: Changes of cirrhosis with large volume ascites. Wall thickening diffusely throughout the colon which could be related to colitis or hypoproteinemia related to liver disease. Bibasilar opacities could reflect atelectasis or infiltrates. Distended gallbladder.  No visible stones or wall thickening. Electronically Signed   By: Charlett Nose M.D.   On: 08/08/2020 03:52    Procedures Procedures   Medications Ordered in ED Medications  morphine 4 MG/ML injection 4 mg (4 mg Intravenous Given 08/08/20 0311)  iohexol (OMNIPAQUE) 300 MG/ML solution 100 mL (100 mLs Intravenous Contrast Given 08/08/20 0319)    ED Course  I have reviewed the triage vital signs and the nursing notes.  Pertinent labs & imaging results that were available during my care of the patient were reviewed by me and considered in my medical decision making (see chart for details).    MDM Rules/Calculators/A&P                          Patient is 50 year old cirrhotic presented today with nausea vomiting diarrhea for 2 days.  He also has abdominal distention and has had paracentesis in the past.  No history of SBP.  No fevers but does endorse chills.  Physical exam is notable for severe tenderness with light and deep palpation of the abdomen.  Patient is significantly jaundiced. Appears chronically ill.  Difficult establish chronicity of patient's current symptoms.  He states he has never had abdominal pain like this before.  He states that although he takes Percocet for chronic pain he states that this is only affecting his cervical spine.  Coags obtained and mildly elevated consistent  with cirrhotic coagulopathy.  CBC with mild leukopenia.  Hemoglobin within normal limits. CMP with low protein, albumin and elevated AST consistent with alcoholic hepatitis. Notably patient's total bilirubin is 8.7 which is improved from prior.  Lipase only mildly elevated at 58 doubt pancreatitis.  Urinalysis without any evidence of infection.  Small bilirubin likely due to patient's hyperbilirubinemia from his liver disease.  I discussed this case with my attending physician who cosigned this note including patient's presenting symptoms, physical exam, and planned diagnostics and interventions. Attending physician stated agreement with plan or made changes to plan which were implemented.   Attending physician assessed patient at bedside.  CT abdomen pelvis shows consistent evidence of cirrhosis patient does have diffuse wall thickening throughout the colon.  I discussed this finding with finding physician. IMPRESSION:  Changes of cirrhosis with large volume ascites.    Wall thickening diffusely throughout the colon which could be  related to colitis or hypoproteinemia related to liver disease.    Bibasilar opacities could reflect atelectasis or infiltrates.    Distended gallbladder. No visible stones or wall thickening.   Given that CT imaging does not show any convincing cause of patient's symptoms today there is a concern for SBP.  I evaluated patient with ultrasound and did not see any significant large easily accessible pockets of ascitic fluid.  We will place order for IR ultrasound paracentesis. Care transferred to oncoming team.   Final Clinical Impression(s) / ED Diagnoses Final diagnoses:  SBP (spontaneous bacterial peritonitis) (HCC)  Generalized abdominal pain    Rx / DC Orders ED Discharge Orders    None  Solon Augusta Bl[place], [place] 08/08/20 0263    Zadie Rhine, MD 08/08/20 2358

## 2020-08-08 NOTE — ED Provider Notes (Signed)
Care assumed from [facility], PA-C at shift change with US guided paracentesis pending .  In brief, this patient is a 50 y.o. M past history of alcoholic cirrhosis with esophageal varices, alcohol abuse who presents for evaluation of abdominal pain x2 days. He reports some decreased appetite as well. He has had nausea/vomiting. No fevers, chills. He also reports that he has had swelling in his abdomen. Please see note from previous provider for full history/physical exam.   Physical Exam  BP 131/78   Pulse (!) 103   Temp 98.1 F (36.7 C) (Oral)   Resp 15   Ht 5\' 8"  (1.727 m)   Wt 86.2 kg   SpO2 98%   BMI 28.89 kg/m   Physical Exam   Abdomen is distended, diffuse tenderness no focal point.  ED Course/Procedures   Clinical Course as of 08/08/20 1456  Mon Aug 08, 2020  1414 Body fluid cell count with differential(!) [JH]    Clinical Course User Index [JH] Aug 10, 2020, MD    Procedures  Results for orders placed or performed during the hospital encounter of 08/08/20 (from the past 24 hour(s))  Lipase, blood     Status: Abnormal   Collection Time: 08/08/20 12:12 AM  Result Value Ref Range   Lipase 58 (H) 11 - 51 U/L  Comprehensive metabolic panel     Status: Abnormal   Collection Time: 08/08/20 12:12 AM  Result Value Ref Range   Sodium 138 135 - 145 mmol/L   Potassium 3.6 3.5 - 5.1 mmol/L   Chloride 105 98 - 111 mmol/L   CO2 23 22 - 32 mmol/L   Glucose, Bld 112 (H) 70 - 99 mg/dL   BUN 14 6 - 20 mg/dL   Creatinine, Ser 10/06/20 0.61 - 1.24 mg/dL   Calcium 8.5 (L) 8.9 - 10.3 mg/dL   Total Protein 6.2 (L) 6.5 - 8.1 g/dL   Albumin 3.0 (L) 3.5 - 5.0 g/dL   AST 58 (H) 15 - 41 U/L   ALT 39 0 - 44 U/L   Alkaline Phosphatase 149 (H) 38 - 126 U/L   Total Bilirubin 8.7 (H) 0.3 - 1.2 mg/dL   GFR, Estimated 6.44 >03 mL/min   Anion gap 10 5 - 15  CBC     Status: Abnormal   Collection Time: 08/08/20 12:12 AM  Result Value Ref Range   WBC 3.0 (L) 4.0 - 10.5 K/uL   RBC 4.18  (L) 4.22 - 5.81 MIL/uL   Hemoglobin 13.5 13.0 - 17.0 g/dL   HCT 10/06/20 42.5 - 95.6 %   MCV 93.5 80.0 - 100.0 fL   MCH 32.3 26.0 - 34.0 pg   MCHC 34.5 30.0 - 36.0 g/dL   RDW 38.7 (H) 56.4 - 33.2 %   Platelets 66 (L) 150 - 400 K/uL   nRBC 0.0 0.0 - 0.2 %  Urinalysis, Routine w reflex microscopic Urine, Clean Catch     Status: Abnormal   Collection Time: 08/08/20 12:13 AM  Result Value Ref Range   Color, Urine AMBER (A) YELLOW   APPearance CLEAR CLEAR   Specific Gravity, Urine 1.027 1.005 - 1.030   pH 5.0 5.0 - 8.0   Glucose, UA NEGATIVE NEGATIVE mg/dL   Hgb urine dipstick NEGATIVE NEGATIVE   Bilirubin Urine SMALL (A) NEGATIVE   Ketones, ur 5 (A) NEGATIVE mg/dL   Protein, ur 30 (A) NEGATIVE mg/dL   Nitrite NEGATIVE NEGATIVE   Leukocytes,Ua NEGATIVE NEGATIVE   RBC / HPF 0-5  0 - 5 RBC/hpf   WBC, UA 0-5 0 - 5 WBC/hpf   Bacteria, UA NONE SEEN NONE SEEN   Mucus PRESENT   Protime-INR     Status: Abnormal   Collection Time: 08/08/20  5:07 AM  Result Value Ref Range   Prothrombin Time 23.2 (H) 11.4 - 15.2 seconds   INR 2.1 (H) 0.8 - 1.2  APTT     Status: Abnormal   Collection Time: 08/08/20  5:07 AM  Result Value Ref Range   aPTT 39 (H) 24 - 36 seconds  SARS Coronavirus 2 by RT PCR (hospital order, performed in [facility] hospital lab) Nasopharyngeal Nasopharyngeal Swab     Status: None   Collection Time: 08/08/20  6:46 AM   Specimen: Nasopharyngeal Swab  Result Value Ref Range   SARS Coronavirus 2 NEGATIVE NEGATIVE  Lactate dehydrogenase (pleural or peritoneal fluid)     Status: Abnormal   Collection Time: 08/08/20 12:22 PM  Result Value Ref Range   LD, Fluid 73 (H) 3 - 23 U/L   Fluid Type-FLDH PERITONEAL CAVITY   Glucose, pleural or peritoneal fluid     Status: None   Collection Time: 08/08/20 12:22 PM  Result Value Ref Range   Glucose, Fluid 97 mg/dL   Fluid Type-FGLU PERITONEAL CAVITY   Protein, pleural or peritoneal fluid     Status: None   Collection Time: 08/08/20 12:22  PM  Result Value Ref Range   Total protein, fluid <3.0 g/dL   Fluid Type-FTP PERITONEAL CAVITY   Albumin, pleural or peritoneal fluid     Status: None   Collection Time: 08/08/20 12:22 PM  Result Value Ref Range   Albumin, Fluid <1.0 g/dL   Fluid Type-FALB PERITONEAL CAVITY   Body fluid cell count with differential     Status: Abnormal   Collection Time: 08/08/20 12:22 PM  Result Value Ref Range   Fluid Type-FCT PERITONEAL    Color, Fluid ORANGE (A) YELLOW   Appearance, Fluid TURBID (A) CLEAR   Total Nucleated Cell Count, Fluid 16,485 (H) 0 - 1,000 cu mm   Neutrophil Count, Fluid 81 (H) 0 - 25 %   Lymphs, Fluid 3 %   Monocyte-Macrophage-Serous Fluid 16 (L) 50 - 90 %  Pathologist smear review     Status: None   Collection Time: 08/08/20 12:22 PM  Result Value Ref Range   Path Review Reviewed By Havery Moros, M.D.   CBG monitoring, ED     Status: None   Collection Time: 08/08/20 12:57 PM  Result Value Ref Range   Glucose-Capillary 98 70 - 99 mg/dL   Comment 1 Notify RN     MDM    PLAN: Patient pending paracentesis to evaluate for SBP.  MDM:  Covid is negative.  His body cell count shows that the appearance is turbid, he has a total nucleated count of 16,485.  Neutrophils are 81.  He had a paracentesis done in July 2020 and his levels will are all unremarkable.  Reevaluation.  He does report some improvement in pain after the fluid was drained but still reports pain.  He still tachycardic.  On my exam, he still has diffuse tenderness.  At this time, concern for possible SBP.  I have given patient 2 g of Rocephin has prophylactic treatment.  He has a culture pending but at this time, given concerns I feel that he needs to be admitted.  Discussed with Dr. Ronaldo Miyamoto (hospitalist) who accepts patient for admission.  1. Generalized abdominal pain   2. SBP (spontaneous bacterial peritonitis) (HCC)     Portions of this note were generated with Dragon dictation software. Dictation  errors may occur despite best attempts at proofreading.     Maxwell Caul, PA-C 08/08/20 1456    Jacalyn Lefevre, MD 08/08/20 1520

## 2020-08-08 NOTE — ED Notes (Signed)
Pt ambulatory to restroom w/out assistance.  

## 2020-08-08 NOTE — Procedures (Signed)
Ultrasound-guided diagnostic and therapeutic paracentesis performed yielding 2.6 liters of turbid, amber fluid. No immediate complications. A portion of the fluid was sent to the lab for preordered studies. EBL< 2 cc.

## 2020-08-08 NOTE — H&P (Addendum)
History and Physical    Jared Bass XJO:[id number] DOB: 1971-03-02 DOA: 08/08/2020  PCP: Sherrie Mustache, MD  Patient coming from: Home  Chief Complaint: Abdominal pain  HPI: Jared Bass is a 50 y.o. male with medical history significant of a fib, alcoholic cirrhosis, chronic pain. Presenting with abdominal distention and pain. Symptoms began 2 days ago. Episodic stabbing pain globally. Was able to ignore it initially, but it worsened through last night. He tried his regular pain meds and his cirrhosis meds, but they didn't help. Since he wasn't able to get relief, he came to the ED. He denies any other aggravating or alleviating factors.    ED Course: Imaging showed large volume ascites and thickening throughout the colon. He had 2.6L removed by IR. Per EDP, initial fluid studies were concerning for SBP. So rocephin was started. TRH was called for admission.   Review of Systems:  Denies CP, dyspnea, palpitations, F, D. Reports N/V, decreased appetite. Review of systems is otherwise negative for all not mentioned in HPI.   PMHx Past Medical History:  Diagnosis Date  . Acute renal failure (HCC)   . Acute respiratory failure (HCC)    2017  . Acute respiratory failure with hypoxia (HCC)   . Acute saddle pulmonary embolism with acute cor pulmonale (HCC)   . Chronic neck pain   . Closed fracture of nasal bone   . DDD (degenerative disc disease), cervical 05/04/2015  . Faintness   . Hemoptysis   . History of atrial fibrillation   . History of head injury 05/04/2015  . History of tachycardia 05/04/2015  . Hypertension   . Hypotension   . Pulmonary emboli (HCC)    on elequis  . Pulmonary embolism (HCC)   . Right eye injury   . Saddle pulmonary embolus (HCC) 12/13/2015  . Syncope and collapse   . Tachycardia 12/20/2015    PSHx Past Surgical History:  Procedure Laterality Date  . ESOPHAGOGASTRODUODENOSCOPY (EGD) WITH PROPOFOL N/A 01/29/2020   Procedure: ESOPHAGOGASTRODUODENOSCOPY (EGD)  WITH PROPOFOL;  Surgeon: Shellia Cleverly, DO;  Location: [facility];  Service: Gastroenterology;  Laterality: N/A;  . IR PARACENTESIS  01/29/2020  . neck fusion     c5-7  . TONSILLECTOMY      SocHx  reports that he has never smoked. He has never used smokeless tobacco. He reports that he does not drink alcohol and does not use drugs.  Allergies  Allergen Reactions  . Tramadol Anaphylaxis and Rash  . Other Other (See Comments)    Rembrant Whitening strips  "fat lips"    FamHx Family History  Problem Relation Age of Onset  . Hypertension Mother   . COPD Mother   . Mesothelioma Father   . COPD Father   . Peripheral vascular disease Brother        Clots in his arteries surgically removed    Prior to Admission medications   Medication Sig Start Date End Date Taking? Authorizing Provider  carvedilol (COREG) 6.25 MG tablet TAKE 1 TABLET BY MOUTH 2 TIMES DAILY WITH A MEAL. Patient taking differently: Take 6.25 mg by mouth daily. 06/27/20  Yes Inez Catalina, MD  furosemide (LASIX) 20 MG tablet TAKE 1 TABLET BY MOUTH EVERY DAY 06/27/20  Yes Inez Catalina, MD  gabapentin (NEURONTIN) 300 MG capsule Take 300 mg by mouth 3 (three) times daily. Uses PRN 06/03/20  Yes [provider]  Multiple Vitamins-Minerals (MULTI + OMEGA-3 ADULT GUMMIES PO) Take 1 tablet by mouth  daily.   Yes [provider]  spironolactone (ALDACTONE) 50 MG tablet TAKE 1 TABLET BY MOUTH EVERY DAY 06/27/20  Yes Inez Catalina, MD  albuterol (VENTOLIN HFA) 108 (90 Base) MCG/ACT inhaler INHALE 2 PUFFS INTO THE LUNGS EVERY 6 HOURS AS NEEDED (COUGH). Patient taking differently: Inhale 2 puffs into the lungs every 6 (six) hours as needed for wheezing or shortness of breath. 05/04/19   Inez Catalina, MD  pantoprazole (PROTONIX) 40 MG tablet Take 1 tablet (40 mg total) by mouth 2 (two) times daily. Take for six weeks. Stop two weeks before stool study Patient not taking: Reported on 08/08/2020 02/10/20    Cirigliano, Vito V, DO  polyethylene glycol (MIRALAX / GLYCOLAX) packet Take 17 g by mouth daily. Patient taking differently: Take 17 g by mouth daily as needed for mild constipation. 07/30/17   Arnetha Courser, MD    Physical Exam: Vitals:   08/08/20 1300 08/08/20 1330 08/08/20 1400 08/08/20 1430  BP: (!) 149/81 (!) 141/80 135/77 131/78  Pulse: (!) 110 (!) 106 (!) 103 (!) 103  Resp: 16 14 15 15   Temp:      TempSrc:      SpO2: 98% 98% 98% 98%  Weight:      Height:        General: 50 y.o. male resting in bed in NAD Eyes: PERRL, normal sclera ENMT: Nares patent w/o discharge, orophaynx clear, dentition normal, ears w/o discharge/lesions/ulcers Neck: Supple, trachea midline Cardiovascular: tachy, irregular, +S1, S2, no m/g/r, equal pulses throughout Respiratory: CTABL, no w/r/r, normal WOB GI: BS+, mild distention, mild TTP globally , no masses noted, no organomegaly noted MSK: No e/c/c Skin: No rashes, bruises, ulcerations noted Neuro: A&O x 3, no focal deficits Psyc: Appropriate interaction and affect, calm/cooperative  Labs on Admission: I have personally reviewed following labs and imaging studies  CBC: Recent Labs  Lab 08/08/20 0012  WBC 3.0*  HGB 13.5  HCT 39.1  MCV 93.5  PLT 66*   Basic Metabolic Panel: Recent Labs  Lab 08/08/20 0012  NA 138  K 3.6  CL 105  CO2 23  GLUCOSE 112*  BUN 14  CREATININE 0.81  CALCIUM 8.5*   GFR: Estimated Creatinine Clearance: 115.2 mL/min (by C-G formula based on SCr of 0.81 mg/dL). Liver Function Tests: Recent Labs  Lab 08/08/20 0012  AST 58*  ALT 39  ALKPHOS 149*  BILITOT 8.7*  PROT 6.2*  ALBUMIN 3.0*   Recent Labs  Lab 08/08/20 0012  LIPASE 58*   No results for input(s): AMMONIA in the last 168 hours. Coagulation Profile: Recent Labs  Lab 08/08/20 0507  INR 2.1*   Cardiac Enzymes: No results for input(s): CKTOTAL, CKMB, CKMBINDEX, TROPONINI in the last 168 hours. BNP (last 3 results) No results for  input(s): PROBNP in the last 8760 hours. HbA1C: No results for input(s): HGBA1C in the last 72 hours. CBG: Recent Labs  Lab 08/08/20 1257  GLUCAP 98   Lipid Profile: No results for input(s): CHOL, HDL, LDLCALC, TRIG, CHOLHDL, LDLDIRECT in the last 72 hours. Thyroid Function Tests: No results for input(s): TSH, T4TOTAL, FREET4, T3FREE, THYROIDAB in the last 72 hours. Anemia Panel: No results for input(s): VITAMINB12, FOLATE, FERRITIN, TIBC, IRON, RETICCTPCT in the last 72 hours. Urine analysis:    Component Value Date/Time   COLORURINE AMBER (A) 08/08/2020 0013   APPEARANCEUR CLEAR 08/08/2020 0013   LABSPEC 1.027 08/08/2020 0013   PHURINE 5.0 08/08/2020 0013   GLUCOSEU NEGATIVE 08/08/2020 0013  HGBUR NEGATIVE 08/08/2020 0013   BILIRUBINUR SMALL (A) 08/08/2020 0013   KETONESUR 5 (A) 08/08/2020 0013   PROTEINUR 30 (A) 08/08/2020 0013   UROBILINOGEN 1.0 02/01/2009 1447   NITRITE NEGATIVE 08/08/2020 0013   LEUKOCYTESUR NEGATIVE 08/08/2020 0013    Radiological Exams on Admission: CT ABDOMEN PELVIS W CONTRAST  Result Date: 08/08/2020 CLINICAL DATA:  Abdominal distension EXAM: CT ABDOMEN AND PELVIS WITH CONTRAST TECHNIQUE: Multidetector CT imaging of the abdomen and pelvis was performed using the standard protocol following bolus administration of intravenous contrast. CONTRAST:  OMNIPAQUE IOHEXOL 300 MG/ML  SOLN COMPARISON:  07/26/2016 FINDINGS: Lower chest: Bibasilar airspace opacities could reflect atelectasis or infiltrates. Heart is normal size. No effusions. Hepatobiliary: Nodular, shrunken liver compatible with cirrhosis. Scattered hypodensities in the liver, likely cysts. Gallbladder is distended. No biliary ductal dilatation. Pancreas: No focal abnormality or ductal dilatation. Spleen: No focal abnormality.  Normal size. Adrenals/Urinary Tract: No renal or adrenal mass. No hydronephrosis. Urinary bladder decompressed, grossly unremarkable. Stomach/Bowel: There is wall  thickening noted within the right colon, transverse colon and proximal descending colon. This could reflect colitis or be related to hyperproteinemia from liver disease. Stomach and small bowel decompressed, unremarkable. No bowel obstruction. Vascular/Lymphatic: No evidence of aneurysm or adenopathy. Reproductive: No visible focal abnormality. Other: Large volume ascites in the abdomen and pelvis. Musculoskeletal: No acute bony abnormality. IMPRESSION: Changes of cirrhosis with large volume ascites. Wall thickening diffusely throughout the colon which could be related to colitis or hypoproteinemia related to liver disease. Bibasilar opacities could reflect atelectasis or infiltrates. Distended gallbladder.  No visible stones or wall thickening. Electronically Signed   By: Charlett Nose M.D.   On: 08/08/2020 03:52   US Paracentesis  Result Date: 08/08/2020 INDICATION: Patient with history of alcoholic cirrhosis, recurrent ascites. Request received for diagnostic and therapeutic paracentesis. EXAM: ULTRASOUND GUIDED DIAGNOSTIC AND THERAPEUTIC PARACENTESIS MEDICATIONS: 1% lidocaine to skin and subcutaneous tissue COMPLICATIONS: None immediate. PROCEDURE: Informed written consent was obtained from the patient after a discussion of the risks, benefits and alternatives to treatment. A timeout was performed prior to the initiation of the procedure. Initial ultrasound scanning demonstrates a moderate amount of ascites within the right lower abdominal quadrant. The right lower abdomen was prepped and draped in the usual sterile fashion. 1% lidocaine was used for local anesthesia. Following this, a 19 gauge, 10-cm, Yueh catheter was introduced. An ultrasound image was saved for documentation purposes. The paracentesis was performed. The catheter was removed and a dressing was applied. The patient tolerated the procedure well without immediate post procedural complication. FINDINGS: A total of approximately 2.6 liters of  turbid, amber fluid was removed. Samples were sent to the laboratory as requested by the clinical team. IMPRESSION: Successful ultrasound-guided diagnostic and therapeutic paracentesis yielding 2.6 liters of peritoneal fluid. Read by: Jeananne Rama, PA-C Electronically Signed   By: Judie Petit.  Shick M.D.   On: 08/08/2020 14:09   Assessment/Plan Abdominal pain/distention SBP Possible colitis     - admit to inpt, tele     - s/p 2.6L removed in paracentesis; studies pending     - started on rocephin in the ED for SBP; add flagyl to cover for colitis     - continue spironolactone, lasix  Hx of alcoholic cirrhosis Thrombocytopenia Leukopenia     - his thrombocytopenia is actually better than his baseline at this point     - no evidence of bleed     - his leukopenia is off his baseline possibly owing to  infection; continue abx and follow     - continue lasix, aldactone  A fib     - continue coreg     - he isn't sure why he isn't on anticoagulation; follow up oupt  GERD     - protonix  Hyperbilirubinemia     - chronically elevated going back to 07/21; follow  DVT prophylaxis: lovenox  Code Status: FULL  Family Communication: None at bedside.   Consults called: None   Status is: Inpatient  Remains inpatient appropriate because:Inpatient level of care appropriate due to severity of illness   Dispo: The patient is from: Home              Anticipated d/c is to: Home              Anticipated d/c date is: 2 days              Patient currently is not medically stable to d/c.   Difficult to place patient No  Teddy Spike DO [facility]  If 7PM-7AM, please contact night-coverage [email or web address]  08/08/2020, 2:57 PM

## 2020-08-08 NOTE — ED Triage Notes (Signed)
Pt reports N/V/D for 2 days. Also reports abdominal distention. Reports hx of cirrhosis and states that he had to have a paracentesis last year. Refused a 12 lead or any interventions with EMS.

## 2020-08-08 NOTE — Discharge Instructions (Addendum)
Please follow-up with your gastroenterologist.  I have provided you with the information for their office. Please continue take your medications as prescribed.    Additional discharge instructions  Please get your medications reviewed and adjusted by your Primary MD.  Please request your Primary MD to go over all Hospital Tests and Procedure/Radiological results at the follow up, please get all Hospital records sent to your Prim MD by signing hospital release before you go home.  If you had Pneumonia of Lung problems at the Hospital: Please get a 2 view Chest X ray done in 6-8 weeks after hospital discharge or sooner if instructed by your Primary MD.  If you have Congestive Heart Failure: Please call your Cardiologist or Primary MD anytime you have any of the following symptoms:  1) 3 pound weight gain in 24 hours or 5 pounds in 1 week  2) shortness of breath, with or without a dry hacking cough  3) swelling in the hands, feet or stomach  4) if you have to sleep on extra pillows at night in order to breathe  Follow cardiac low salt diet and 1.5 lit/day fluid restriction.  If you have diabetes Accuchecks 4 times/day, Once in AM empty stomach and then before each meal. Log in all results and show them to your primary doctor at your next visit. If any glucose reading is under 80 or above 300 call your primary MD immediately.  If you have Seizure/Convulsions/Epilepsy: Please do not drive, operate heavy machinery, participate in activities at heights or participate in high speed sports until you have seen by Primary MD or a Neurologist and advised to do so again.  If you had Gastrointestinal Bleeding: Please ask your Primary MD to check a complete blood count within one week of discharge or at your next visit. Your endoscopic/colonoscopic biopsies that are pending at the time of discharge, will also need to followed by your Primary MD.  Get Medicines reviewed and adjusted. Please take  all your medications with you for your next visit with your Primary MD  Please request your Primary MD to go over all hospital tests and procedure/radiological results at the follow up, please ask your Primary MD to get all Hospital records sent to his/her office.  If you experience worsening of your admission symptoms, develop shortness of breath, life threatening emergency, suicidal or homicidal thoughts you must seek medical attention immediately by calling 911 or calling your MD immediately  if symptoms less severe.  You must read complete instructions/literature along with all the possible adverse reactions/side effects for all the Medicines you take and that have been prescribed to you. Take any new Medicines after you have completely understood and accpet all the possible adverse reactions/side effects.   Do not drive or operate heavy machinery when taking Pain medications.   Do not take more than prescribed Pain, Sleep and Anxiety Medications  Special Instructions: If you have smoked or chewed Tobacco  in the last 2 yrs please stop smoking, stop any regular Alcohol  and or any Recreational drug use.  Wear Seat belts while driving.  Please note You were cared for by a hospitalist during your hospital stay. If you have any questions about your discharge medications or the care you received while you were in the hospital after you are discharged, you can call the unit and asked to speak with the hospitalist on call if the hospitalist that took care of you is not available. Once you are discharged, your primary  care physician will handle any further medical issues. Please note that NO REFILLS for any discharge medications will be authorized once you are discharged, as it is imperative that you return to your primary care physician (or establish a relationship with a primary care physician if you do not have one) for your aftercare needs so that they can reassess your need for medications and  monitor your lab values.  You can reach the hospitalist office at phone [phone] or fax [phone]   If you do not have a primary care physician, you can call [phone] for a physician referral.

## 2020-08-08 NOTE — ED Notes (Signed)
ED TO INPATIENT HANDOFF REPORT  ED Nurse Name and Phone #: Reita Cliche RN  S Name/Age/Gender Jared Bass 50 y.o. male Room/Bed: WA07/WA07  Code Status   Code Status: Full Code  Home/SNF/Other Home Patient oriented to: self Is this baseline? Yes   Triage Complete: Triage complete  Chief Complaint SBP (spontaneous bacterial peritonitis) (HCC) [K65.2]  Triage Note Pt reports N/V/D for 2 days. Also reports abdominal distention. Reports hx of cirrhosis and states that he had to have a paracentesis last year. Refused a 12 lead or any interventions with EMS.     Allergies Allergies  Allergen Reactions  . Tramadol Anaphylaxis and Rash  . Other Other (See Comments)    Rembrant Whitening strips  "fat lips"    Level of Care/Admitting Diagnosis ED Disposition    ED Disposition Condition Comment   Admit  Hospital Area: [facility] [[id number]]  Level of Care: Telemetry [5]  Admit to tele based on following criteria: Complex arrhythmia (Bradycardia/Tachycardia)  May admit patient to [facility] or [facility] if equivalent level of care is available:: No  Covid Evaluation: Confirmed COVID Negative  Diagnosis: SBP (spontaneous bacterial peritonitis) [facility]) [301095]  Admitting Physician: Teddy Spike [[id number]]  Attending Physician: Teddy Spike [[id number]]  Estimated length of stay: past midnight tomorrow  Certification:: I certify this patient will need inpatient services for at least 2 midnights       B Medical/Surgery History Past Medical History:  Diagnosis Date  . Acute renal failure (HCC)   . Acute respiratory failure (HCC)    2017  . Acute respiratory failure with hypoxia (HCC)   . Acute saddle pulmonary embolism with acute cor pulmonale (HCC)   . Chronic neck pain   . Closed fracture of nasal bone   . DDD (degenerative disc disease), cervical 05/04/2015  . Faintness   . Hemoptysis   . History of atrial fibrillation   . History of head injury  05/04/2015  . History of tachycardia 05/04/2015  . Hypertension   . Hypotension   . Pulmonary emboli (HCC)    on elequis  . Pulmonary embolism (HCC)   . Right eye injury   . Saddle pulmonary embolus (HCC) 12/13/2015  . Syncope and collapse   . Tachycardia 12/20/2015   Past Surgical History:  Procedure Laterality Date  . ESOPHAGOGASTRODUODENOSCOPY (EGD) WITH PROPOFOL N/A 01/29/2020   Procedure: ESOPHAGOGASTRODUODENOSCOPY (EGD) WITH PROPOFOL;  Surgeon: Shellia Cleverly, DO;  Location: [facility];  Service: Gastroenterology;  Laterality: N/A;  . IR PARACENTESIS  01/29/2020  . neck fusion     c5-7  . TONSILLECTOMY       A IV Location/Drains/Wounds Patient Lines/Drains/Airways Status    Active Line/Drains/Airways    Name Placement date Placement time Site Days   Peripheral IV 08/08/20 Left 08/08/20  0140  --  less than 1          Intake/Output Last 24 hours No intake or output data in the 24 hours ending 08/08/20 2154  Labs/Imaging Results for orders placed or performed during the hospital encounter of 08/08/20 (from the past 48 hour(s))  Lipase, blood     Status: Abnormal   Collection Time: 08/08/20 12:12 AM  Result Value Ref Range   Lipase 58 (H) 11 - 51 U/L    Comment: Performed at [facility], [address] [place], [place] [address]  Comprehensive metabolic panel     Status: Abnormal   Collection Time: 08/08/20 12:12 AM  Result Value Ref Range   Sodium 138 135 - 145 mmol/L   Potassium 3.6 3.5 - 5.1 mmol/L   Chloride 105 98 - 111 mmol/L   CO2 23 22 - 32 mmol/L   Glucose, Bld 112 (H) 70 - 99 mg/dL    Comment: Glucose reference range applies only to samples taken after fasting for at least 8 hours.   BUN 14 6 - 20 mg/dL   Creatinine, Ser 7.59 0.61 - 1.24 mg/dL   Calcium 8.5 (L) 8.9 - 10.3 mg/dL   Total Protein 6.2 (L) 6.5 - 8.1 g/dL   Albumin 3.0 (L) 3.5 - 5.0 g/dL   AST 58 (H) 15 - 41 U/L   ALT 39 0 - 44 U/L   Alkaline Phosphatase 149 (H)  38 - 126 U/L   Total Bilirubin 8.7 (H) 0.3 - 1.2 mg/dL   GFR, Estimated >16 >38 mL/min    Comment: (NOTE) Calculated using the CKD-EPI Creatinine Equation (2021)    Anion gap 10 5 - 15    Comment: Performed at [facility], [address] [place], [place], [place] [address]  CBC     Status: Abnormal   Collection Time: 08/08/20 12:12 AM  Result Value Ref Range   WBC 3.0 (L) 4.0 - 10.5 K/uL   RBC 4.18 (L) 4.22 - 5.81 MIL/uL   Hemoglobin 13.5 13.0 - 17.0 g/dL   HCT 93.5 70.1 - 77.9 %   MCV 93.5 80.0 - 100.0 fL   MCH 32.3 26.0 - 34.0 pg   MCHC 34.5 30.0 - 36.0 g/dL   RDW 39.0 (H) 30.0 - 92.3 %   Platelets 66 (L) 150 - 400 K/uL    Comment: Immature Platelet Fraction may be clinically indicated, consider ordering this additional test [id number]    nRBC 0.0 0.0 - 0.2 %    Comment: Performed at [facility], [address] [place], [place] [address]  Urinalysis, Routine w reflex microscopic Urine, Clean Catch     Status: Abnormal   Collection Time: 08/08/20 12:13 AM  Result Value Ref Range   Color, Urine AMBER (A) YELLOW    Comment: BIOCHEMICALS MAY BE AFFECTED BY COLOR   APPearance CLEAR CLEAR   Specific Gravity, Urine 1.027 1.005 - 1.030   pH 5.0 5.0 - 8.0   Glucose, UA NEGATIVE NEGATIVE mg/dL   Hgb urine dipstick NEGATIVE NEGATIVE   Bilirubin Urine SMALL (A) NEGATIVE   Ketones, ur 5 (A) NEGATIVE mg/dL   Protein, ur 30 (A) NEGATIVE mg/dL   Nitrite NEGATIVE NEGATIVE   Leukocytes,Ua NEGATIVE NEGATIVE   RBC / HPF 0-5 0 - 5 RBC/hpf   WBC, UA 0-5 0 - 5 WBC/hpf   Bacteria, UA NONE SEEN NONE SEEN   Mucus PRESENT     Comment: Performed at [facility], [address] [place], [place] [address]  Protime-INR     Status: Abnormal   Collection Time: 08/08/20  5:07 AM  Result Value Ref Range   Prothrombin Time 23.2 (H) 11.4 - 15.2 seconds   INR 2.1 (H) 0.8 - 1.2    Comment: (NOTE) INR goal varies based on device and disease  states. Performed at [facility], [address] [place], [place] [address]   APTT     Status: Abnormal   Collection Time: 08/08/20  5:07 AM  Result Value Ref Range   aPTT 39 (H) 24 - 36 seconds    Comment:  IF BASELINE aPTT IS ELEVATED, SUGGEST PATIENT RISK ASSESSMENT BE USED TO DETERMINE APPROPRIATE ANTICOAGULANT THERAPY. Performed at [facility], [address], [place], [place] [address]   SARS Coronavirus 2 by RT PCR (hospital order, performed in [facility] hospital lab) Nasopharyngeal Nasopharyngeal Swab     Status: None   Collection Time: 08/08/20  6:46 AM   Specimen: Nasopharyngeal Swab  Result Value Ref Range   SARS Coronavirus 2 NEGATIVE NEGATIVE    Comment: (NOTE) SARS-CoV-2 target nucleic acids are NOT DETECTED.  The SARS-CoV-2 RNA is generally detectable in upper and lower respiratory specimens during the acute phase of infection. The lowest concentration of SARS-CoV-2 viral copies this assay can detect is 250 copies / mL. A negative result does not preclude SARS-CoV-2 infection and should not be used as the sole basis for treatment or other patient management decisions.  A negative result may occur with improper specimen collection / handling, submission of specimen other than nasopharyngeal swab, presence of viral mutation(s) within the areas targeted by this assay, and inadequate number of viral copies (<250 copies / mL). A negative result must be combined with clinical observations, patient history, and epidemiological information.  Fact Sheet for Patients:   BoilerBrush.com.cy  Fact Sheet for Healthcare Providers: https://pope.com/  This test is not yet approved or  cleared by the [place] FDA and has been authorized for detection and/or diagnosis of SARS-CoV-2 by FDA under an Emergency Use Authorization (EUA).  This EUA will remain in effect (meaning this  test can be used) for the duration of the COVID-19 declaration under Section 564(b)(1) of the Act, 21 U.S.C. section 360bbb-3(b)(1), unless the authorization is terminated or revoked sooner.  Performed at [facility], [address] [place], [place] [address]   Lactate dehydrogenase (pleural or peritoneal fluid)     Status: Abnormal   Collection Time: 08/08/20 12:22 PM  Result Value Ref Range   LD, Fluid 73 (H) 3 - 23 U/L    Comment: (NOTE) Results should be evaluated in conjunction with serum values    Fluid Type-FLDH PERITONEAL CAVITY     Comment: Performed at [facility], [address], [place], [place] [address]  Glucose, pleural or peritoneal fluid     Status: None   Collection Time: 08/08/20 12:22 PM  Result Value Ref Range   Glucose, Fluid 97 mg/dL    Comment: (NOTE) No normal range established for this test Results should be evaluated in conjunction with serum values    Fluid Type-FGLU PERITONEAL CAVITY     Comment: Performed at [facility], [address] [place], [place] [address]  Protein, pleural or peritoneal fluid     Status: None   Collection Time: 08/08/20 12:22 PM  Result Value Ref Range   Total protein, fluid <3.0 g/dL    Comment: (NOTE) No normal range established for this test Results should be evaluated in conjunction with serum values    Fluid Type-FTP PERITONEAL CAVITY     Comment: Performed at [facility], [address] [place], [place] [address]  Albumin, pleural or peritoneal fluid     Status: None   Collection Time: 08/08/20 12:22 PM  Result Value Ref Range   Albumin, Fluid <1.0 g/dL    Comment: (NOTE) No normal range established for this test Results should be evaluated in conjunction with serum values    Fluid Type-FALB PERITONEAL CAVITY     Comment: Performed at [facility], [address], [place], [place] [address]  Body fluid cell  count with differential     Status: Abnormal   Collection Time: 08/08/20 12:22 PM  Result Value Ref Range   Fluid Type-FCT PERITONEAL     Comment: CORRECTED ON 02/07 AT 1242: PREVIOUSLY REPORTED AS CYTO PERI   Color, Fluid ORANGE (A) YELLOW   Appearance, Fluid TURBID (A) CLEAR   Total Nucleated Cell Count, Fluid 16,485 (H) 0 - 1,000 cu mm   Neutrophil Count, Fluid 81 (H) 0 - 25 %   Lymphs, Fluid 3 %   Monocyte-Macrophage-Serous Fluid 16 (L) 50 - 90 %    Comment: Performed at [facility], [address] [place], [place] [address]  Pathologist smear review     Status: None   Collection Time: 08/08/20 12:22 PM  Result Value Ref Range   Path Review Reviewed By Havery Moros, M.D.     Comment: 08/08/20 Acute inflammation. Performed at [facility], [address] [place], [place] [address]   CBG monitoring, ED     Status: None   Collection Time: 08/08/20 12:57 PM  Result Value Ref Range   Glucose-Capillary 98 70 - 99 mg/dL    Comment: Glucose reference range applies only to samples taken after fasting for at least 8 hours.   Comment 1 Notify RN    CT ABDOMEN PELVIS W CONTRAST  Result Date: 08/08/2020 CLINICAL DATA:  Abdominal distension EXAM: CT ABDOMEN AND PELVIS WITH CONTRAST TECHNIQUE: Multidetector CT imaging of the abdomen and pelvis was performed using the standard protocol following bolus administration of intravenous contrast. CONTRAST:  OMNIPAQUE IOHEXOL 300 MG/ML  SOLN COMPARISON:  07/26/2016 FINDINGS: Lower chest: Bibasilar airspace opacities could reflect atelectasis or infiltrates. Heart is normal size. No effusions. Hepatobiliary: Nodular, shrunken liver compatible with cirrhosis. Scattered hypodensities in the liver, likely cysts. Gallbladder is distended. No biliary ductal dilatation. Pancreas: No focal abnormality or ductal dilatation. Spleen: No focal abnormality.  Normal size. Adrenals/Urinary Tract: No renal or adrenal mass. No  hydronephrosis. Urinary bladder decompressed, grossly unremarkable. Stomach/Bowel: There is wall thickening noted within the right colon, transverse colon and proximal descending colon. This could reflect colitis or be related to hyperproteinemia from liver disease. Stomach and small bowel decompressed, unremarkable. No bowel obstruction. Vascular/Lymphatic: No evidence of aneurysm or adenopathy. Reproductive: No visible focal abnormality. Other: Large volume ascites in the abdomen and pelvis. Musculoskeletal: No acute bony abnormality. IMPRESSION: Changes of cirrhosis with large volume ascites. Wall thickening diffusely throughout the colon which could be related to colitis or hypoproteinemia related to liver disease. Bibasilar opacities could reflect atelectasis or infiltrates. Distended gallbladder.  No visible stones or wall thickening. Electronically Signed   By: Charlett Nose M.D.   On: 08/08/2020 03:52   US Paracentesis  Result Date: 08/08/2020 INDICATION: Patient with history of alcoholic cirrhosis, recurrent ascites. Request received for diagnostic and therapeutic paracentesis. EXAM: ULTRASOUND GUIDED DIAGNOSTIC AND THERAPEUTIC PARACENTESIS MEDICATIONS: 1% lidocaine to skin and subcutaneous tissue COMPLICATIONS: None immediate. PROCEDURE: Informed written consent was obtained from the patient after a discussion of the risks, benefits and alternatives to treatment. A timeout was performed prior to the initiation of the procedure. Initial ultrasound scanning demonstrates a moderate amount of ascites within the right lower abdominal quadrant. The right lower abdomen was prepped and draped in the usual sterile fashion. 1% lidocaine was used for local anesthesia. Following this, a 19 gauge, 10-cm, Yueh catheter was introduced. An ultrasound image  was saved for documentation purposes. The paracentesis was performed. The catheter was removed and a dressing was applied. The patient tolerated the procedure well  without immediate post procedural complication. FINDINGS: A total of approximately 2.6 liters of turbid, amber fluid was removed. Samples were sent to the laboratory as requested by the clinical team. IMPRESSION: Successful ultrasound-guided diagnostic and therapeutic paracentesis yielding 2.6 liters of peritoneal fluid. Read by: Jeananne RamaKevin Allred, PA-C Electronically Signed   By: Judie PetitM.  Shick M.D.   On: 08/08/2020 14:09    Pending Labs Unresulted Labs (From admission, onward)          Start     Ordered   08/15/20 0500  Creatinine, serum  (enoxaparin (LOVENOX)    CrCl >/= 30 ml/min)  Weekly,   R     Comments: while on enoxaparin therapy    08/08/20 1730   08/09/20 0500  Comprehensive metabolic panel  Tomorrow morning,   R        08/08/20 1730   08/09/20 0500  CBC  Tomorrow morning,   R        08/08/20 1730   08/08/20 0849  Body fluid culture  (Peritoneal fluid analysis panel (pnl))  ONCE - STAT,   STAT       Question:  Are there also cytology or pathology orders on this specimen?  Answer:  Yes   08/08/20 0848          Vitals/Pain Today's Vitals   08/08/20 1530 08/08/20 2028 08/08/20 2100 08/08/20 2130  BP: (!) 115/97 (!) 134/91 128/79 137/80  Pulse: 99 (!) 112 (!) 101 96  Resp: 14 15 14 17   Temp:      TempSrc:      SpO2: 93% 99% 99% 99%  Weight:      Height:      PainSc:        Isolation Precautions No active isolations  Medications Medications  carvedilol (COREG) tablet 6.25 mg (has no administration in time range)  furosemide (LASIX) tablet 20 mg (has no administration in time range)  spironolactone (ALDACTONE) tablet 50 mg (has no administration in time range)  gabapentin (NEURONTIN) capsule 300 mg (has no administration in time range)  albuterol (VENTOLIN HFA) 108 (90 Base) MCG/ACT inhaler 2 puff (has no administration in time range)  enoxaparin (LOVENOX) injection 40 mg (has no administration in time range)  acetaminophen (TYLENOL) tablet 650 mg (has no administration in  time range)    Or  acetaminophen (TYLENOL) suppository 650 mg (has no administration in time range)  ondansetron (ZOFRAN) tablet 4 mg (has no administration in time range)    Or  ondansetron (ZOFRAN) injection 4 mg (has no administration in time range)  morphine 4 MG/ML injection 4 mg (has no administration in time range)  multivitamin with minerals tablet 1 tablet (has no administration in time range)  thiamine tablet 100 mg (has no administration in time range)  folic acid (FOLVITE) tablet 1 mg (has no administration in time range)  metroNIDAZOLE (FLAGYL) tablet 500 mg (has no administration in time range)  morphine 4 MG/ML injection 4 mg (4 mg Intravenous Given 08/08/20 0311)  iohexol (OMNIPAQUE) 300 MG/ML solution 100 mL (100 mLs Intravenous Contrast Given 08/08/20 0319)  fentaNYL (SUBLIMAZE) injection 50 mcg (50 mcg Intravenous Given 08/08/20 0824)  ondansetron (ZOFRAN) injection 4 mg (4 mg Intravenous Given 08/08/20 0824)  morphine 4 MG/ML injection 4 mg (4 mg Intravenous Given 08/08/20 1107)  ondansetron (ZOFRAN) injection 4 mg (4 mg Intravenous  Given 08/08/20 1107)  lidocaine (XYLOCAINE) 1 % (with pres) injection (  Given by Other 08/08/20 1157)  lidocaine (XYLOCAINE) 1 % (with pres) injection (  Given by Other 08/08/20 1248)  fentaNYL (SUBLIMAZE) injection 50 mcg (50 mcg Intravenous Given 08/08/20 1448)  cefTRIAXone (ROCEPHIN) 2 g in sodium chloride 0.9 % 100 mL IVPB (2 g Intravenous New Bag/Given 08/08/20 1448)    Mobility walks     Focused Assessments Condition stable   R Recommendations: See Admitting Provider Note  Report given to: Abby RN  Additional Notes:  Pt stable

## 2020-08-09 DIAGNOSIS — I4891 Unspecified atrial fibrillation: Secondary | ICD-10-CM

## 2020-08-09 DIAGNOSIS — D696 Thrombocytopenia, unspecified: Secondary | ICD-10-CM

## 2020-08-09 DIAGNOSIS — D689 Coagulation defect, unspecified: Secondary | ICD-10-CM

## 2020-08-09 DIAGNOSIS — R112 Nausea with vomiting, unspecified: Secondary | ICD-10-CM

## 2020-08-09 LAB — COMPREHENSIVE METABOLIC PANEL
ALT: 33 U/L (ref 0–44)
AST: 47 U/L — ABNORMAL HIGH (ref 15–41)
Albumin: 2.5 g/dL — ABNORMAL LOW (ref 3.5–5.0)
Alkaline Phosphatase: 137 U/L — ABNORMAL HIGH (ref 38–126)
Anion gap: 7 (ref 5–15)
BUN: 16 mg/dL (ref 6–20)
CO2: 25 mmol/L (ref 22–32)
Calcium: 8.2 mg/dL — ABNORMAL LOW (ref 8.9–10.3)
Chloride: 102 mmol/L (ref 98–111)
Creatinine, Ser: 0.65 mg/dL (ref 0.61–1.24)
GFR, Estimated: >60 mL/min (ref >60)
Glucose, Bld: 114 mg/dL — ABNORMAL HIGH (ref 70–99)
Potassium: 3.4 mmol/L — ABNORMAL LOW (ref 3.5–5.1)
Sodium: 134 mmol/L — ABNORMAL LOW (ref 135–145)
Total Bilirubin: 11.4 mg/dL — ABNORMAL HIGH (ref 0.3–1.2)
Total Protein: 5.3 g/dL — ABNORMAL LOW (ref 6.5–8.1)

## 2020-08-09 LAB — CBC
HCT: 32.3 % — ABNORMAL LOW (ref 39.0–52.0)
Hemoglobin: 11.4 g/dL — ABNORMAL LOW (ref 13.0–17.0)
MCH: 33.1 pg (ref 26.0–34.0)
MCHC: 35.3 g/dL (ref 30.0–36.0)
MCV: 93.9 fL (ref 80.0–100.0)
Platelets: 61 10*3/uL — ABNORMAL LOW (ref 150–400)
RBC: 3.44 MIL/uL — ABNORMAL LOW (ref 4.22–5.81)
RDW: 17.5 % — ABNORMAL HIGH (ref 11.5–15.5)
WBC: 9.5 10*3/uL (ref 4.0–10.5)
nRBC: 0 % (ref 0.0–0.2)

## 2020-08-09 MED ORDER — SODIUM CHLORIDE 0.9 % IV SOLN
2.0000 g | INTRAVENOUS | Status: DC
  Administered 2020-08-09 – 2020-08-11 (×3): 2 g via INTRAVENOUS
  Filled 2020-08-09 (×2): qty 20
  Filled 2020-08-09 (×2): qty 2

## 2020-08-09 MED ORDER — TRAZODONE HCL 50 MG PO TABS: 50.0000 mg | ORAL_TABLET | Freq: Every evening | ORAL | Status: DC | PRN

## 2020-08-09 NOTE — Hospital Course (Addendum)
50 year old man PMH alcoholic cirrhosis, atrial fibrillation presented with abdominal pain.  Admitted for SBP, decompensated cirrhosis.  A & P  SBP in the context of cirrhosis, with associated nausea and vomiting --Status post diagnostic tap.  Follow-up culture data.  Clinically better.  Continue current antibiotics.  Decompensated alcoholic cirrhosis with abdominal ascites, thrombocytopenia, coagulopathy, history of esophageal varices --Status post large-volume tap with subjective improvement in ascites --MELD-Na 24 = 14-15% 3 month mortality estimate --continue spironolactone and Lasix --needs outpatient GI followup  Alcohol abuse in remission --No recent use per patient.  Chronic pain of the neck --Stable.  Continue Percocet  Atrial fibrillation --Continue carvedilol.  Not a candidate for anticoagulation given thrombocytopenia and coagulopathy.  No clinical signs or symptoms of colitis.  Imaging findings presumably secondary to hypoalbuminemia.

## 2020-08-09 NOTE — Progress Notes (Signed)
PROGRESS NOTE  Jared Bass OLM:[id number] DOB: [birth year] DOA: 08/08/2020 PCP: Sherrie Mustache, MD  Brief History   50 year old man PMH alcoholic cirrhosis, atrial fibrillation presented with abdominal pain.  Admitted for SBP, decompensated cirrhosis.  A & P  SBP in the context of cirrhosis, with associated nausea and vomiting --Status post diagnostic tap.  Follow-up culture data.  Clinically better.  Continue current antibiotics.  Decompensated alcoholic cirrhosis with abdominal ascites, thrombocytopenia, coagulopathy, history of esophageal varices --Status post large-volume tap with subjective improvement in ascites --MELD-Na 24 = 14-15% 3 month mortality estimate --continue spironolactone and Lasix --needs outpatient GI followup  Alcohol abuse in remission --No recent use per patient.  Chronic pain of the neck --Stable.  Continue Percocet  Atrial fibrillation --Continue carvedilol.  Not a candidate for anticoagulation given thrombocytopenia and coagulopathy.  No clinical signs or symptoms of colitis.  Imaging findings presumably secondary to hypoalbuminemia.    Disposition Plan:  Discussion:   Status is: Inpatient  Remains inpatient appropriate because:IV treatments appropriate due to intensity of illness or inability to take PO and Inpatient level of care appropriate due to severity of illness   Dispo: The patient is from: Home              Anticipated d/c is to: Home              Anticipated d/c date is: 3 days              Patient currently is not medically stable to d/c.   Difficult to place patient No  DVT prophylaxis: enoxaparin (LOVENOX) injection 40 mg Start: 08/08/20 1800   Code Status: Full Code Level of care: Telemetry Family Communication: none  Brendia Sacks, MD  [facility] Direct contact: see [email or web address] (further directions at bottom of note if needed) 7PM-7AM contact night coverage as at bottom of note 08/09/2020, 5:13 PM  LOS: 1 day    Significant Hospital Events   .    Consults:  .    Procedures:  . 2/7 paracentesis 2.6 L turbid amber fluid  Significant Diagnostic Tests:  . CT abdomen pelvis large volume ascites, cirrhosis   Micro Data:  . 2/7 peritoneal fluid culture   Antimicrobials:  . 2/7 ceftriaxone  Interval History/Subjective  CC: f/u abd pain  Still has abd pain, but feels better.  Objective   Vitals:  Vitals:   08/09/20 0905 08/09/20 1314  BP: 134/76 136/85  Pulse: 98 87  Resp: 20 (!) 22  Temp: 99.1 F (37.3 C) 98.7 F (37.1 C)  SpO2: 97% 98%    Exam:  Constitutional:   . Appears calm and comfortable ENMT:  . grossly normal hearing  Respiratory:  . CTA bilaterally, no w/r/r.  . Respiratory effort normal. Cardiovascular:  . RRR, no m/r/g . No LE extremity edema   Abdomen:  . Distended, soft, moderate right sided pain Psychiatric:  . Mental status o Mood, affect appropriate  I have personally reviewed the following:   Today's Data  . Potassium 3.4, remainder BMP unremarkable. . Total bilirubin up to 11.4.  AST without significant change. . Hemoglobin lower, 11.4.  Platelets stable 61. . INR 2.1. . Peritoneal fluid suggestive of SBP  Scheduled Meds: . carvedilol  6.25 mg Oral Daily  . enoxaparin (LOVENOX) injection  40 mg Subcutaneous Q24H  . folic acid  1 mg Oral Daily  . furosemide  20 mg Oral Daily  . multivitamin with minerals  1 tablet Oral Daily  .  spironolactone  50 mg Oral Daily  . thiamine  100 mg Oral Daily   Continuous Infusions: . cefTRIAXone (ROCEPHIN)  IV 2 g (08/09/20 1450)    Principal Problem:   SBP (spontaneous bacterial peritonitis) (HCC) Active Problems:   Decompensated hepatic cirrhosis (HCC)   Secondary esophageal varices without bleeding (HCC)   Nausea & vomiting   Thrombocytopenia (HCC)   Coagulopathy (HCC)   Unspecified atrial fibrillation (HCC)   LOS: 1 day   How to contact the [facility] Attending or Consulting provider 7A - 7P  or covering provider during after hours 7P -7A, for this patient?  1. Check the care team in [facility] and look for a) attending/consulting [facility] provider listed and b) the [facility] team listed 2. Log into [email or web address] and use [place]'s universal password to access. If you do not have the password, please contact the hospital operator. 3. Locate the [facility] provider you are looking for under [facility] and page to a number that you can be directly reached. 4. If you still have difficulty reaching the provider, please page the [facility] (Director on Call) for the Hospitalists listed on amion for assistance.

## 2020-08-10 DIAGNOSIS — K652 Spontaneous bacterial peritonitis: Principal | ICD-10-CM

## 2020-08-10 DIAGNOSIS — K7460 Unspecified cirrhosis of liver: Secondary | ICD-10-CM

## 2020-08-10 DIAGNOSIS — K7290 Hepatic failure, unspecified without coma: Secondary | ICD-10-CM

## 2020-08-10 LAB — CBC
HCT: 31.5 % — ABNORMAL LOW (ref 39.0–52.0)
Hemoglobin: 11 g/dL — ABNORMAL LOW (ref 13.0–17.0)
MCH: 32.8 pg (ref 26.0–34.0)
MCHC: 34.9 g/dL (ref 30.0–36.0)
MCV: 94 fL (ref 80.0–100.0)
Platelets: 53 10*3/uL — ABNORMAL LOW (ref 150–400)
RBC: 3.35 MIL/uL — ABNORMAL LOW (ref 4.22–5.81)
RDW: 17.4 % — ABNORMAL HIGH (ref 11.5–15.5)
WBC: 6.1 10*3/uL (ref 4.0–10.5)
nRBC: 0 % (ref 0.0–0.2)

## 2020-08-10 LAB — COMPREHENSIVE METABOLIC PANEL
ALT: 34 U/L (ref 0–44)
AST: 52 U/L — ABNORMAL HIGH (ref 15–41)
Albumin: 2.4 g/dL — ABNORMAL LOW (ref 3.5–5.0)
Alkaline Phosphatase: 130 U/L — ABNORMAL HIGH (ref 38–126)
Anion gap: 8 (ref 5–15)
BUN: 12 mg/dL (ref 6–20)
CO2: 25 mmol/L (ref 22–32)
Calcium: 8 mg/dL — ABNORMAL LOW (ref 8.9–10.3)
Chloride: 104 mmol/L (ref 98–111)
Creatinine, Ser: 0.67 mg/dL (ref 0.61–1.24)
GFR, Estimated: >60 mL/min (ref >60)
Glucose, Bld: 128 mg/dL — ABNORMAL HIGH (ref 70–99)
Potassium: 3.1 mmol/L — ABNORMAL LOW (ref 3.5–5.1)
Sodium: 137 mmol/L (ref 135–145)
Total Bilirubin: 9 mg/dL — ABNORMAL HIGH (ref 0.3–1.2)
Total Protein: 4.8 g/dL — ABNORMAL LOW (ref 6.5–8.1)

## 2020-08-10 LAB — MAGNESIUM: Magnesium: 1.9 mg/dL (ref 1.7–2.4)

## 2020-08-10 MED ORDER — ALBUMIN HUMAN 25 % IV SOLN
100.0000 g | Freq: Once | INTRAVENOUS | Status: AC
  Administered 2020-08-10: 100 g via INTRAVENOUS
  Filled 2020-08-10: qty 400

## 2020-08-10 MED ORDER — POTASSIUM CHLORIDE CRYS ER 20 MEQ PO TBCR
40.0000 meq | EXTENDED_RELEASE_TABLET | Freq: Once | ORAL | Status: AC
  Administered 2020-08-10: 40 meq via ORAL
  Filled 2020-08-10: qty 2

## 2020-08-10 NOTE — Progress Notes (Signed)
PROGRESS NOTE   Jared Bass  WUJ:[id number]    DOB: 03/16/1971    DOA: 08/08/2020  PCP: Sherrie Mustache, MD   I have briefly reviewed patients previous medical records in [facility].  Chief Complaint  Patient presents with  . Abdominal Pain    Brief Narrative:  50 year old male with history of alcoholic cirrhosis, prior alcohol use disorder-quit drinking alcohol several months ago after last hospitalization in June 2021, atrial fibrillation, small esophageal varices by EGD June 2021, presented to the hospital due to abdominal pain and distention.  Underwent large-volume paracentesis with ascitic fluid consistent with SBP based on cell count despite cultures negative thus far.  [facility] consulted for assistance.   Assessment & Plan:  Principal Problem:   SBP (spontaneous bacterial peritonitis) (HCC) Active Problems:   Decompensated hepatic cirrhosis (HCC)   Secondary esophageal varices without bleeding (HCC)   Nausea & vomiting   Thrombocytopenia (HCC)   Coagulopathy (HCC)   Unspecified atrial fibrillation (HCC)   Decompensated alcoholic cirrhosis complicated by ascites/SBP, thrombocytopenia, coagulopathy, esophageal varices S/p ultrasound-guided diagnostic and therapeutic paracentesis 2.6 L of turbid fluid by IR on 2/7.  Ascitic fluid results: Albumin <1, total protein <3, total WBCs: 16.4K, 81% neutrophils.  Ascitic fluid culture negative to date.  [facility] consultation appreciated and they recommend: IV albumin 100 g today because of elevated total bilirubin and recently diagnosed SBP, continue IV ceftriaxone today (day 3) and tomorrow, hopeful discharge tomorrow or the day after on oral antibiotics for secondary prevention (recommend Cipro 500 mg daily or Bactrim DS 1 tablet daily).  Patient reportedly was referred to GI but never followed up, stressed importance of outpatient follow-up with GI upon discharge.  Continue spironolactone and Lasix.  Improved.  Platelet counts 53K,  trend daily.  Hypokalemia: Replace and follow.  Check magnesium.  Anemia of chronic disease Stable.  Alcohol use disorder, in remission Congratulated and advised continued abstinence.  Chronic neck pain Stable.  Atrial fibrillation Continue carvedilol.  Not a candidate for long-term anticoagulation given thrombocytopenia and coagulopathy.  Body mass index is 28.73 kg/m.    DVT prophylaxis: enoxaparin (LOVENOX) injection 40 mg Start: 08/08/20 1800-discontinued due to platelet count of 53,000.  Added SCDs.   Code Status: Full Code Family Communication: None at bedside Disposition:  Status is: Inpatient  Remains inpatient appropriate because:IV treatments appropriate due to intensity of illness or inability to take PO   Dispo: The patient is from: Home              Anticipated d/c is to: Home              Anticipated d/c date is: 1 day              Patient currently is not medically stable to d/c.   Difficult to place patient No        Consultants:   [facility]  Procedures:   Diagnostic and therapeutic paracentesis by IR 2/7  Antimicrobials:    Anti-infectives (From admission, onward)   Start     Dose/Rate Route Frequency Ordered Stop   08/09/20 1400  cefTRIAXone (ROCEPHIN) 2 g in sodium chloride 0.9 % 100 mL IVPB        2 g 200 mL/hr over 30 Minutes Intravenous Every 24 hours 08/09/20 0932     08/08/20 1745  metroNIDAZOLE (FLAGYL) tablet 500 mg  Status:  Discontinued        500 mg Oral Every 8 hours 08/08/20 1730  08/09/20 1713   08/08/20 1445  cefTRIAXone (ROCEPHIN) 2 g in sodium chloride 0.9 % 100 mL IVPB        2 g 200 mL/hr over 30 Minutes Intravenous  Once 08/08/20 1430 08/08/20 1518        Subjective:  Feels better.  Abdominal distention and pain significantly improved.  In fact no pain reported.  Eager to discharge if possible.  No nausea or vomiting.  Objective:   Vitals:   08/09/20 2207 08/10/20 0543 08/10/20 0543 08/10/20 1421  BP: 123/71  120/83 120/83 120/80  Pulse: 90 83 83 86  Resp: 18 15 15 16   Temp: 98.1 F (36.7 C) 98.7 F (37.1 C) 98.7 F (37.1 C) 98 F (36.7 C)  TempSrc: Oral Oral Oral Oral  SpO2: 99% 98% 98% 98%  Weight:  85.7 kg    Height:        General exam: Pleasant young male, moderately built and nourished sitting up comfortably in bed. Respiratory system: Clear to auscultation. Respiratory effort normal. Cardiovascular system: S1 & S2 heard, RRR. No JVD, murmurs, rubs, gallops or clicks. No pedal edema.  Not on telemetry. Gastrointestinal system: Abdomen is mildly distended, soft and nontender. No organomegaly or masses felt. Normal bowel sounds heard.  Ascites +. Central nervous system: Alert and oriented. No focal neurological deficits. Extremities: Symmetric 5 x 5 power. Skin: No rashes, lesions or ulcers Psychiatry: Judgement and insight appear normal. Mood & affect appropriate.     Data Reviewed:   I have personally reviewed following labs and imaging studies   CBC: Recent Labs  Lab 08/08/20 0012 08/09/20 0517 08/10/20 0534  WBC 3.0* 9.5 6.1  HGB 13.5 11.4* 11.0*  HCT 39.1 32.3* 31.5*  MCV 93.5 93.9 94.0  PLT 66* 61* 53*    Basic Metabolic Panel: Recent Labs  Lab 08/08/20 0012 08/09/20 0517 08/10/20 0534  NA 138 134* 137  K 3.6 3.4* 3.1*  CL 105 102 104  CO2 23 25 25   GLUCOSE 112* 114* 128*  BUN 14 16 12   CREATININE 0.81 0.65 0.67  CALCIUM 8.5* 8.2* 8.0*    Liver Function Tests: Recent Labs  Lab 08/08/20 0012 08/09/20 0517 08/10/20 0534  AST 58* 47* 52*  ALT 39 33 34  ALKPHOS 149* 137* 130*  BILITOT 8.7* 11.4* 9.0*  PROT 6.2* 5.3* 4.8*  ALBUMIN 3.0* 2.5* 2.4*    CBG: Recent Labs  Lab 08/08/20 1257  GLUCAP 98    Microbiology Studies:   Recent Results (from the past 240 hour(s))  SARS Coronavirus 2 by RT PCR (hospital order, performed in [facility] hospital lab) Nasopharyngeal Nasopharyngeal Swab     Status: None   Collection Time: 08/08/20  6:46 AM    Specimen: Nasopharyngeal Swab  Result Value Ref Range Status   SARS Coronavirus 2 NEGATIVE NEGATIVE Final    Comment: (NOTE) SARS-CoV-2 target nucleic acids are NOT DETECTED.  The SARS-CoV-2 RNA is generally detectable in upper and lower respiratory specimens during the acute phase of infection. The lowest concentration of SARS-CoV-2 viral copies this assay can detect is 250 copies / mL. A negative result does not preclude SARS-CoV-2 infection and should not be used as the sole basis for treatment or other patient management decisions.  A negative result may occur with improper specimen collection / handling, submission of specimen other than nasopharyngeal swab, presence of viral mutation(s) within the areas targeted by this assay, and inadequate number of viral copies (<250 copies / mL). A negative  result must be combined with clinical observations, patient history, and epidemiological information.  Fact Sheet for Patients:   BoilerBrush.com.cy  Fact Sheet for Healthcare Providers: https://pope.com/  This test is not yet approved or  cleared by the [place] FDA and has been authorized for detection and/or diagnosis of SARS-CoV-2 by FDA under an Emergency Use Authorization (EUA).  This EUA will remain in effect (meaning this test can be used) for the duration of the COVID-19 declaration under Section 564(b)(1) of the Act, 21 U.S.C. section 360bbb-3(b)(1), unless the authorization is terminated or revoked sooner.  Performed at [facility], [address] [place], [place] [address]   Body fluid culture     Status: None (Preliminary result)   Collection Time: 08/08/20 12:22 PM   Specimen: Peritoneal Cavity; Peritoneal Fluid  Result Value Ref Range Status   Specimen Description   Final    PERITONEAL CAVITY Performed at [facility], [address], [place], [place] [address]     Special Requests   Final    NONE Performed at [facility], [address], [place], [place] [address]    Gram Stain   Final    MODERATE WBC PRESENT, PREDOMINANTLY PMN NO ORGANISMS SEEN    Culture   Final    NO GROWTH 2 DAYS Performed at [facility], [address] [place], [place] [address]    Report Status PENDING  Incomplete     Radiology Studies:  No results found.   Scheduled Meds:   . carvedilol  6.25 mg Oral Daily  . enoxaparin (LOVENOX) injection  40 mg Subcutaneous Q24H  . folic acid  1 mg Oral Daily  . furosemide  20 mg Oral Daily  . multivitamin with minerals  1 tablet Oral Daily  . potassium chloride  40 mEq Oral Once  . spironolactone  50 mg Oral Daily  . thiamine  100 mg Oral Daily    Continuous Infusions:   . albumin human    . cefTRIAXone (ROCEPHIN)  IV 2 g (08/10/20 1458)     LOS: 2 days     Marcellus Scott, MD, [place], [facility]    To contact the attending provider between 7A-7P or the covering provider during after hours 7P-7A, please log into the web site [email or web address] and access using universal [place] password for that web site. If you do not have the password, please call the hospital operator.  08/10/2020, 3:01 PM

## 2020-08-10 NOTE — Consult Note (Signed)
Referring Provider: Dr. Waymon Amato [facility]) Primary Care Physician:  Sherrie Mustache, MD Primary Gastroenterologist:  Gentry Fitz  Reason for Consultation:  SBP, cirrhosis  HPI: Jared Bass is a 50 y.o. male with history of cirrhosis and esophageal varices presenting for consultation of SBP.  Patient presented to the ED on 2/7 due to acutely worsening abdominal distention and pain and was found to have SBP.  He has some nausea but denies vomiting.  He reports diarrhea for several days but states his stool is now more formed and less frequent. He denies melena or hematochezia.    He states he previously drank one-fifth to half gallon of vodka per day but has not had any alcohol since his diagnosis of cirrhosis in 2021.  Denies family history of liver disease.  EGD 12/2019: Grade I and grade II esophageal varices. Portal hypertensive gastropathy. Gastritis.  Serology positive for H pylori.  Denies family history of colon cancer or gastrointestinal malignancy. He has never had a colonoscopy.  Past Medical History:  Diagnosis Date  . Acute renal failure (HCC)   . Acute respiratory failure (HCC)    2017  . Acute respiratory failure with hypoxia (HCC)   . Acute saddle pulmonary embolism with acute cor pulmonale (HCC)   . Chronic neck pain   . Closed fracture of nasal bone   . DDD (degenerative disc disease), cervical 05/04/2015  . Faintness   . Hemoptysis   . History of atrial fibrillation   . History of head injury 05/04/2015  . History of tachycardia 05/04/2015  . Hypertension   . Hypotension   . Pulmonary emboli (HCC)    on elequis  . Pulmonary embolism (HCC)   . Right eye injury   . Saddle pulmonary embolus (HCC) 12/13/2015  . Syncope and collapse   . Tachycardia 12/20/2015    Past Surgical History:  Procedure Laterality Date  . ESOPHAGOGASTRODUODENOSCOPY (EGD) WITH PROPOFOL N/A 01/29/2020   Procedure: ESOPHAGOGASTRODUODENOSCOPY (EGD) WITH PROPOFOL;  Surgeon: Shellia Cleverly, DO;   Location: [facility] ENDOSCOPY;  Service: Gastroenterology;  Laterality: N/A;  . IR PARACENTESIS  01/29/2020  . neck fusion     c5-7  . TONSILLECTOMY      Prior to Admission medications   Medication Sig Start Date End Date Taking? Authorizing Provider  carvedilol (COREG) 6.25 MG tablet TAKE 1 TABLET BY MOUTH 2 TIMES DAILY WITH A MEAL. Patient taking differently: Take 6.25 mg by mouth daily. 06/27/20  Yes Inez Catalina, MD  furosemide (LASIX) 20 MG tablet TAKE 1 TABLET BY MOUTH EVERY DAY 06/27/20  Yes Inez Catalina, MD  gabapentin (NEURONTIN) 300 MG capsule Take 300 mg by mouth 3 (three) times daily. Uses PRN 06/03/20  Yes [provider]  Multiple Vitamins-Minerals (MULTI + OMEGA-3 ADULT GUMMIES PO) Take 1 tablet by mouth daily.   Yes [provider]  spironolactone (ALDACTONE) 50 MG tablet TAKE 1 TABLET BY MOUTH EVERY DAY 06/27/20  Yes Inez Catalina, MD  albuterol (VENTOLIN HFA) 108 (90 Base) MCG/ACT inhaler INHALE 2 PUFFS INTO THE LUNGS EVERY 6 HOURS AS NEEDED (COUGH). Patient taking differently: Inhale 2 puffs into the lungs every 6 (six) hours as needed for wheezing or shortness of breath. 05/04/19   Inez Catalina, MD  pantoprazole (PROTONIX) 40 MG tablet Take 1 tablet (40 mg total) by mouth 2 (two) times daily. Take for six weeks. Stop two weeks before stool study Patient not taking: Reported on 08/08/2020 02/10/20   Shellia Cleverly, DO  polyethylene glycol (MIRALAX / GLYCOLAX) packet Take 17 g by mouth daily. Patient taking differently: Take 17 g by mouth daily as needed for mild constipation. 07/30/17   Arnetha Courser, MD    Scheduled Meds: . carvedilol  6.25 mg Oral Daily  . enoxaparin (LOVENOX) injection  40 mg Subcutaneous Q24H  . folic acid  1 mg Oral Daily  . furosemide  20 mg Oral Daily  . multivitamin with minerals  1 tablet Oral Daily  . potassium chloride  40 mEq Oral Once  . spironolactone  50 mg Oral Daily  . thiamine  100 mg Oral Daily   Continuous  Infusions: . cefTRIAXone (ROCEPHIN)  IV 2 g (08/09/20 1450)   PRN Meds:.albuterol, gabapentin, morphine injection, ondansetron **OR** ondansetron (ZOFRAN) IV, traZODone  Allergies as of 08/07/2020 - Review Complete 01/29/2020  Allergen Reaction Noted  . Tramadol Anaphylaxis and Rash 12/29/2007  . Other Other (See Comments) 07/09/2012    Family History  Problem Relation Age of Onset  . Hypertension Mother   . COPD Mother   . Mesothelioma Father   . COPD Father   . Peripheral vascular disease Brother        Clots in his arteries surgically removed    Social History   Socioeconomic History  . Marital status: Single    Spouse name: Not on file  . Number of children: Not on file  . Years of education: Not on file  . Highest education level: Not on file  Occupational History  . Not on file  Tobacco Use  . Smoking status: Never Smoker  . Smokeless tobacco: Never Used  Substance and Sexual Activity  . Alcohol use: No    Alcohol/week: 0.0 standard drinks  . Drug use: No  . Sexual activity: Not on file  Other Topics Concern  . 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   Social Determinants of Health   Financial Resource Strain: Not on file  Food Insecurity: Not on file  Transportation Needs: Not on file  Physical Activity: Not on file  Stress: Not on file  Social Connections: Not on file  Intimate Partner Violence: Not on file    Review of Systems: Review of Systems  Constitutional: Negative for chills and fever.  HENT: Negative for hearing loss and tinnitus.   Eyes: Negative for pain and redness.  Respiratory: Negative for cough and shortness of breath.   Cardiovascular: Negative for chest pain and palpitations.  Gastrointestinal: Positive for abdominal pain, diarrhea and nausea. Negative for blood in stool, constipation, heartburn, melena and vomiting.  Genitourinary: Negative for flank pain and  hematuria.  Musculoskeletal: Negative for falls and joint pain.  Skin: Negative for itching and rash.  Neurological: Negative for seizures and loss of consciousness.  Endo/Heme/Allergies: Negative for polydipsia. Does not bruise/bleed easily.  Psychiatric/Behavioral: Negative for substance abuse. The patient is not nervous/anxious.    Physical Exam: Vital signs: Vitals:   08/10/20 0543 08/10/20 0543  BP: 120/83 120/83  Pulse: 83 83  Resp: 15 15  Temp: 98.7 F (37.1 C) 98.7 F (37.1 C)  SpO2: 98% 98%   Last BM Date: 08/09/20  Physical Exam Vitals reviewed.  Constitutional:      General: He is not in acute distress. HENT:     Head: Normocephalic and atraumatic.     Nose: Nose normal. No congestion.     Mouth/Throat:     Mouth: Mucous membranes are  moist.     Pharynx: Oropharynx is clear.  Eyes:     General: Scleral icterus present.     Extraocular Movements: Extraocular movements intact.  Cardiovascular:     Rate and Rhythm: Normal rate and regular rhythm.     Pulses: Normal pulses.  Pulmonary:     Effort: Pulmonary effort is normal. No respiratory distress.     Breath sounds: Normal breath sounds.  Abdominal:     General: Bowel sounds are normal. There is no distension.     Palpations: Abdomen is soft.     Tenderness: There is no abdominal tenderness. There is no guarding or rebound.     Hernia: No hernia is present.  Musculoskeletal:        General: No swelling or tenderness.     Cervical back: Normal range of motion and neck supple.  Skin:    General: Skin is warm and dry.  Neurological:     General: No focal deficit present.     Mental Status: He is alert and oriented to person, place, and time.  Psychiatric:        Mood and Affect: Mood normal.        Behavior: Behavior normal. Behavior is cooperative.    GI:  Lab Results: Recent Labs    08/08/20 0012 08/09/20 0517 08/10/20 0534  WBC 3.0* 9.5 6.1  HGB 13.5 11.4* 11.0*  HCT 39.1 32.3* 31.5*  PLT  66* 61* 53*   BMET Recent Labs    08/08/20 0012 08/09/20 0517 08/10/20 0534  NA 138 134* 137  K 3.6 3.4* 3.1*  CL 105 102 104  CO2 23 25 25   GLUCOSE 112* 114* 128*  BUN 14 16 12   CREATININE 0.81 0.65 0.67  CALCIUM 8.5* 8.2* 8.0*   LFT Recent Labs    08/10/20 0534  PROT 4.8*  ALBUMIN 2.4*  AST 52*  ALT 34  ALKPHOS 130*  BILITOT 9.0*   PT/INR Recent Labs    08/08/20 0507  LABPROT 23.2*  INR 2.1*     Studies/Results: No results found.  Impression: SBP: total nucleated cells 16,485, neutrophils 81% on 2/7 -No leukocytosis (WBCs 6.1)  Decompensated cirrhosis: MELD score 23 as of 2/7 T. Bili 9.0/ AST 52/ ALT 34/ ALP 130 -INR 2.1 as of 2/7 -Normal renal function: BUN 12/ Cr 0.67  Diarrhea, resolving  Past H. Pylori infection, based on serology, s/p treatment  Plan: Continue IV ceftriaxone today and tomorrow.  After IV dose tomorrow, initiate Cipro 500 mg daily indefinitely.  Continue Lasix 20 mg and spironolactone 50 mg.  Patient will need H pylori stool testing as an outpatient to check for resolution of infection.  2g sodium restricted diet.  Continued abstinence from alcohol discussed.  Patient will need outpatient follow up in 6-8 weeks with [facility] or GI provider of his choice.  Patient verbalized understanding.  If diarrhea persists, recommend GI pathogen panel.  [facility] will follow.   LOS: 2 days   4/7  PA-C 08/10/2020, 1:40 PM  Contact #  [phone]

## 2020-08-11 LAB — CBC
HCT: 31.3 % — ABNORMAL LOW (ref 39.0–52.0)
Hemoglobin: 10.9 g/dL — ABNORMAL LOW (ref 13.0–17.0)
MCH: 33 pg (ref 26.0–34.0)
MCHC: 34.8 g/dL (ref 30.0–36.0)
MCV: 94.8 fL (ref 80.0–100.0)
Platelets: 52 10*3/uL — ABNORMAL LOW (ref 150–400)
RBC: 3.3 MIL/uL — ABNORMAL LOW (ref 4.22–5.81)
RDW: 17.3 % — ABNORMAL HIGH (ref 11.5–15.5)
WBC: 4.9 10*3/uL (ref 4.0–10.5)
nRBC: 0 % (ref 0.0–0.2)

## 2020-08-11 LAB — COMPREHENSIVE METABOLIC PANEL
ALT: 29 U/L (ref 0–44)
AST: 38 U/L (ref 15–41)
Albumin: 3.2 g/dL — ABNORMAL LOW (ref 3.5–5.0)
Alkaline Phosphatase: 108 U/L (ref 38–126)
Anion gap: 9 (ref 5–15)
BUN: 9 mg/dL (ref 6–20)
CO2: 26 mmol/L (ref 22–32)
Calcium: 8.5 mg/dL — ABNORMAL LOW (ref 8.9–10.3)
Chloride: 102 mmol/L (ref 98–111)
Creatinine, Ser: 0.71 mg/dL (ref 0.61–1.24)
GFR, Estimated: >60 mL/min (ref >60)
Glucose, Bld: 107 mg/dL — ABNORMAL HIGH (ref 70–99)
Potassium: 3.3 mmol/L — ABNORMAL LOW (ref 3.5–5.1)
Sodium: 137 mmol/L (ref 135–145)
Total Bilirubin: 6.8 mg/dL — ABNORMAL HIGH (ref 0.3–1.2)
Total Protein: 5.3 g/dL — ABNORMAL LOW (ref 6.5–8.1)

## 2020-08-11 MED ORDER — POTASSIUM CHLORIDE CRYS ER 20 MEQ PO TBCR
30.0000 meq | EXTENDED_RELEASE_TABLET | ORAL | Status: AC
  Administered 2020-08-11 (×2): 30 meq via ORAL
  Filled 2020-08-11 (×2): qty 1

## 2020-08-11 MED ORDER — CIPROFLOXACIN HCL 500 MG PO TABS: 500.0000 mg | ORAL_TABLET | Freq: Every day | ORAL | 0 refills | Status: AC

## 2020-08-11 NOTE — Progress Notes (Signed)
[facility]astroenterology Progress Note  Jared Bass 50 y.o. 12/21/1970  CC:   Decompensated cirrhosis, SBP   Subjective: Patient seen and examined at bedside.  Feeling better.  Denies any acute GI symptoms.  Wants to go home.  ROS : Afebrile this morning.  Negative for chest pain   Objective: Vital signs in last 24 hours: Vitals:   08/10/20 2109 08/11/20 0500  BP: 125/74 120/78  Pulse: 93 90  Resp: 18 18  Temp: 98.4 F (36.9 C) 98.2 F (36.8 C)  SpO2: 97% 96%    Physical Exam:  General:  Alert, cooperative, no distress, appears stated age  Head:  Normocephalic, without obvious abnormality, atraumatic  Eyes:   Scleral icterus noted  Lungs:   Clear to auscultation bilaterally, respirations unlabored  Heart:  Regular rate and rhythm, S1, S2 normal  Abdomen:    Abdomen is mildly distended with ascites, bowel sounds present, no peritoneal signs.  Extremities: Extremities normal, atraumatic, no  edema  Pulses: 2+ and symmetric    Lab Results: Recent Labs    08/10/20 0534 08/10/20 0540 08/11/20 0547  NA 137  --  137  K 3.1*  --  3.3*  CL 104  --  102  CO2 25  --  26  GLUCOSE 128*  --  107*  BUN 12  --  9  CREATININE 0.67  --  0.71  CALCIUM 8.0*  --  8.5*  MG  --  1.9  --    Recent Labs    08/10/20 0534 08/11/20 0547  AST 52* 38  ALT 34 29  ALKPHOS 130* 108  BILITOT 9.0* 6.8*  PROT 4.8* 5.3*  ALBUMIN 2.4* 3.2*   Recent Labs    08/10/20 0534 08/11/20 0547  WBC 6.1 4.9  HGB 11.0* 10.9*  HCT 31.5* 31.3*  MCV 94.0 94.8  PLT 53* 52*   No results for input(s): LABPROT, INR in the last 72 hours.    Assessment/Plan: Assessment  ------------------  -SBP based on elevated PMNs.  -Alcoholic cirrhosis complicated by small esophageal varices and now with SBP. - Decompensated cirrhosis.    Recommendations  -------------------------  -Discussed with Dr. Waymon Amato .  Okay to discharge later today after receiving today's IV antibiotics. - Recommend  ciprofloxacin 500 mg daily  for secondary prophylaxis of SBP.  -long term Prognosis of cirrhosis discussed with the patient.  Need for liver transplant also discussed.  -Low-sodium diet.  Continue low-dose diuretics.  -Follow-up in GI clinic in 4 weeks after discharge.  GI will sign off.  Call us back if needed  Kathi Der MD, FACP 08/11/2020, 9:49 AM  Contact #  [phone]

## 2020-08-11 NOTE — Discharge Summary (Signed)
Physician Discharge Summary  Jared Bass GNF:[id number] DOB: 04/10/1971  PCP: Dr. Fatima Sanger  Admitted from: Home Discharged to: Home  Admit date: 08/08/2020 Discharge date: 08/11/2020  Recommendations for Outpatient Follow-up:    Follow-up Information    Kathi Der, MD. Schedule an appointment as soon as possible for a visit.   Specialty: Gastroenterology Contact information: [address] [place] [address] [phone]        Family Doctor (Dr. Fatima Sanger). Schedule an appointment as soon as possible for a visit in 1 week(s).   Why: To be seen with repeat labs (CBC & CMP)               Home Health: None    Equipment/Devices: None    Discharge Condition: Improved and stable   Code Status: Full Code Diet recommendation:  Discharge Diet Orders (From admission, onward)    Start     Ordered   08/11/20 0000  Diet - low sodium heart healthy       Comments: 2 g sodium restricted diet and fluid restriction of 1500 mL/day.   08/11/20 1254           Discharge Diagnoses:  Principal Problem:   SBP (spontaneous bacterial peritonitis) (HCC) Active Problems:   Decompensated hepatic cirrhosis (HCC)   Secondary esophageal varices without bleeding (HCC)   Nausea & vomiting   Thrombocytopenia (HCC)   Coagulopathy (HCC)   Unspecified atrial fibrillation [facility])   Brief Summary: 50 year old male with history of alcoholic cirrhosis, prior alcohol use disorder-quit drinking alcohol several months ago after last hospitalization in June 2021, atrial fibrillation, small esophageal varices by EGD June 2021, presented to the hospital due to abdominal pain and distention.  Underwent large-volume paracentesis with ascitic fluid consistent with SBP based on cell count despite cultures negative thus far.  [facility] consulted for assistance.   Assessment & Plan:   Decompensated alcoholic cirrhosis complicated by ascites/SBP, thrombocytopenia, coagulopathy,  esophageal varices S/p ultrasound-guided diagnostic and therapeutic paracentesis 2.6 L of turbid fluid by IR on 2/7.  Ascitic fluid results: Albumin <1, total protein <3, total WBCs: 16.4K, 81% neutrophils.  Ascitic fluid culture negative to date.  [facility] was consulted and they recommended IV albumin 100 g x 1 dose on 2/9 because of elevated total bilirubin and recently diagnosed SBP, completed IV ceftriaxone 2 g daily for 4 days.  GI have seen him again today and have cleared him for discharge with recommendations for Cipro 500 mg daily x1 month for SBP secondary prevention (avoiding Bactrim due to significant thrombocytopenia), continue prior home dose of diuretics and they will follow in office as outpatient.  Platelet count stable in the low 50,000 range.  No bleeding reported.  Improved and stable.  Hypokalemia: Replaced aggressively prior to discharge.  Continue spironolactone.  Magnesium 1.9.  Follow BMP as outpatient.  Anemia of chronic disease Stable.  Alcohol use disorder, in remission Congratulated and advised continued abstinence.  Chronic neck pain Stable.  Atrial fibrillation Continue carvedilol.  Not a candidate for long-term anticoagulation given thrombocytopenia and coagulopathy.  Body mass index is 28.73 kg/m.        Consultants:   [facility]  Procedures:   Diagnostic and therapeutic paracentesis by IR 2/7   Discharge Instructions  Discharge Instructions    Call MD for:   Complete by: As directed    Recurrent abdominal/belly distention and/or pain.   Call MD for:  extreme fatigue   Complete by: As  directed    Call MD for:  persistant dizziness or light-headedness   Complete by: As directed    Call MD for:  persistant nausea and vomiting   Complete by: As directed    Call MD for:  severe uncontrolled pain   Complete by: As directed    Call MD for:  temperature >100.4   Complete by: As directed    Diet - low sodium heart healthy   Complete  by: As directed    2 g sodium restricted diet and fluid restriction of 1500 mL/day.   Increase activity slowly   Complete by: As directed        Medication List    STOP taking these medications   pantoprazole 40 MG tablet Commonly known as: PROTONIX     TAKE these medications   albuterol 108 (90 Base) MCG/ACT inhaler Commonly known as: VENTOLIN HFA INHALE 2 PUFFS INTO THE LUNGS EVERY 6 HOURS AS NEEDED (COUGH). What changed: See the new instructions.   carvedilol 6.25 MG tablet Commonly known as: COREG TAKE 1 TABLET BY MOUTH 2 TIMES DAILY WITH A MEAL. What changed: See the new instructions.   ciprofloxacin 500 MG tablet Commonly known as: Cipro Take 1 tablet (500 mg total) by mouth daily with breakfast.   furosemide 20 MG tablet Commonly known as: LASIX TAKE 1 TABLET BY MOUTH EVERY DAY   gabapentin 300 MG capsule Commonly known as: NEURONTIN Take 300 mg by mouth 3 (three) times daily. Uses PRN   MULTI + OMEGA-3 ADULT GUMMIES PO Take 1 tablet by mouth daily.   polyethylene glycol 17 g packet Commonly known as: MIRALAX / GLYCOLAX Take 17 g by mouth daily. What changed:   when to take this  reasons to take this   spironolactone 50 MG tablet Commonly known as: ALDACTONE TAKE 1 TABLET BY MOUTH EVERY DAY      Allergies  Allergen Reactions  . Tramadol Anaphylaxis and Rash  . Other Other (See Comments)    Rembrant Whitening strips  "fat lips"      Procedures/Studies: CT ABDOMEN PELVIS W CONTRAST  Result Date: 08/08/2020 CLINICAL DATA:  Abdominal distension EXAM: CT ABDOMEN AND PELVIS WITH CONTRAST TECHNIQUE: Multidetector CT imaging of the abdomen and pelvis was performed using the standard protocol following bolus administration of intravenous contrast. CONTRAST:  OMNIPAQUE IOHEXOL 300 MG/ML  SOLN COMPARISON:  07/26/2016 FINDINGS: Lower chest: Bibasilar airspace opacities could reflect atelectasis or infiltrates. Heart is normal size. No effusions.  Hepatobiliary: Nodular, shrunken liver compatible with cirrhosis. Scattered hypodensities in the liver, likely cysts. Gallbladder is distended. No biliary ductal dilatation. Pancreas: No focal abnormality or ductal dilatation. Spleen: No focal abnormality.  Normal size. Adrenals/Urinary Tract: No renal or adrenal mass. No hydronephrosis. Urinary bladder decompressed, grossly unremarkable. Stomach/Bowel: There is wall thickening noted within the right colon, transverse colon and proximal descending colon. This could reflect colitis or be related to hyperproteinemia from liver disease. Stomach and small bowel decompressed, unremarkable. No bowel obstruction. Vascular/Lymphatic: No evidence of aneurysm or adenopathy. Reproductive: No visible focal abnormality. Other: Large volume ascites in the abdomen and pelvis. Musculoskeletal: No acute bony abnormality. IMPRESSION: Changes of cirrhosis with large volume ascites. Wall thickening diffusely throughout the colon which could be related to colitis or hypoproteinemia related to liver disease. Bibasilar opacities could reflect atelectasis or infiltrates. Distended gallbladder.  No visible stones or wall thickening. Electronically Signed   By: Charlett Nose M.D.   On: 08/08/2020 03:52   US Paracentesis  Result Date: 08/08/2020 INDICATION: Patient with history of alcoholic cirrhosis, recurrent ascites. Request received for diagnostic and therapeutic paracentesis. EXAM: ULTRASOUND GUIDED DIAGNOSTIC AND THERAPEUTIC PARACENTESIS MEDICATIONS: 1% lidocaine to skin and subcutaneous tissue COMPLICATIONS: None immediate. PROCEDURE: Informed written consent was obtained from the patient after a discussion of the risks, benefits and alternatives to treatment. A timeout was performed prior to the initiation of the procedure. Initial ultrasound scanning demonstrates a moderate amount of ascites within the right lower abdominal quadrant. The right lower abdomen was prepped and draped  in the usual sterile fashion. 1% lidocaine was used for local anesthesia. Following this, a 19 gauge, 10-cm, Yueh catheter was introduced. An ultrasound image was saved for documentation purposes. The paracentesis was performed. The catheter was removed and a dressing was applied. The patient tolerated the procedure well without immediate post procedural complication. FINDINGS: A total of approximately 2.6 liters of turbid, amber fluid was removed. Samples were sent to the laboratory as requested by the clinical team. IMPRESSION: Successful ultrasound-guided diagnostic and therapeutic paracentesis yielding 2.6 liters of peritoneal fluid. Read by: Jeananne Rama, PA-C Electronically Signed   By: Judie Petit.  Shick M.D.   On: 08/08/2020 14:09      Subjective: Patient denies complaints.  No recurrence of abdominal distention or abdominal pain.  Tolerating diet.  Anxious to DC early because reportedly has an appointment at 2 PM today.  Discharge Exam:  Vitals:   08/10/20 1421 08/10/20 2109 08/11/20 0500 08/11/20 1133  BP: 120/80 125/74 120/78 (!) 146/96  Pulse: 86 93 90 99  Resp: 16 18 18    Temp: 98 F (36.7 C) 98.4 F (36.9 C) 98.2 F (36.8 C) 98.3 F (36.8 C)  TempSrc: Oral Oral Oral   SpO2: 98% 97% 96% 98%  Weight:      Height:         General exam: Pleasant young male, moderately built and nourished sitting up comfortably in bed. Respiratory system: Clear to auscultation. Respiratory effort normal. Cardiovascular system: S1 & S2 heard, RRR. No JVD, murmurs, rubs, gallops or clicks. No pedal edema.  Not on telemetry. Gastrointestinal system: Abdomen is mildly distended, soft and nontender. No organomegaly or masses felt. Normal bowel sounds heard.  Ascites +. Central nervous system: Alert and oriented. No focal neurological deficits. Extremities: Symmetric 5 x 5 power. Skin: No rashes, lesions or ulcers Psychiatry: Judgement and insight appear normal. Mood & affect appropriate.     The  results of significant diagnostics from this hospitalization (including imaging, microbiology, ancillary and laboratory) are listed below for reference.     Microbiology: Recent Results (from the past 240 hour(s))  SARS Coronavirus 2 by RT PCR (hospital order, performed in [facility] hospital lab) Nasopharyngeal Nasopharyngeal Swab     Status: None   Collection Time: 08/08/20  6:46 AM   Specimen: Nasopharyngeal Swab  Result Value Ref Range Status   SARS Coronavirus 2 NEGATIVE NEGATIVE Final    Comment: (NOTE) SARS-CoV-2 target nucleic acids are NOT DETECTED.  The SARS-CoV-2 RNA is generally detectable in upper and lower respiratory specimens during the acute phase of infection. The lowest concentration of SARS-CoV-2 viral copies this assay can detect is 250 copies / mL. A negative result does not preclude SARS-CoV-2 infection and should not be used as the sole basis for treatment or other patient management decisions.  A negative result may occur with improper specimen collection / handling, submission of specimen other than nasopharyngeal swab, presence of viral mutation(s) within the areas targeted by  this assay, and inadequate number of viral copies (<250 copies / mL). A negative result must be combined with clinical observations, patient history, and epidemiological information.  Fact Sheet for Patients:   BoilerBrush.com.cy  Fact Sheet for Healthcare Providers: https://pope.com/  This test is not yet approved or  cleared by the [place] FDA and has been authorized for detection and/or diagnosis of SARS-CoV-2 by FDA under an Emergency Use Authorization (EUA).  This EUA will remain in effect (meaning this test can be used) for the duration of the COVID-19 declaration under Section 564(b)(1) of the Act, 21 U.S.C. section 360bbb-3(b)(1), unless the authorization is terminated or revoked sooner.  Performed at [facility], [address], [place], [place] [address]   Body fluid culture     Status: None (Preliminary result)   Collection Time: 08/08/20 12:22 PM   Specimen: Peritoneal Cavity; Peritoneal Fluid  Result Value Ref Range Status   Specimen Description   Final    PERITONEAL CAVITY Performed at [facility], [address] [place], [place] [address]    Special Requests   Final    NONE Performed at [facility], [address] [place], [place] [address]    Gram Stain   Final    MODERATE WBC PRESENT, PREDOMINANTLY PMN NO ORGANISMS SEEN    Culture   Final    NO GROWTH 3 DAYS Performed at [facility], [address] [place], [place] [address]    Report Status PENDING  Incomplete     Labs: CBC: Recent Labs  Lab 08/08/20 0012 08/09/20 0517 08/10/20 0534 08/11/20 0547  WBC 3.0* 9.5 6.1 4.9  HGB 13.5 11.4* 11.0* 10.9*  HCT 39.1 32.3* 31.5* 31.3*  MCV 93.5 93.9 94.0 94.8  PLT 66* 61* 53* 52*    Basic Metabolic Panel: Recent Labs  Lab 08/08/20 0012 08/09/20 0517 08/10/20 0534 08/10/20 0540 08/11/20 0547  NA 138 134* 137  --  137  K 3.6 3.4* 3.1*  --  3.3*  CL 105 102 104  --  102  CO2 23 25 25   --  26  GLUCOSE 112* 114* 128*  --  107*  BUN 14 16 12   --  9  CREATININE 0.81 0.65 0.67  --  0.71  CALCIUM 8.5* 8.2* 8.0*  --  8.5*  MG  --   --   --  1.9  --     Liver Function Tests: Recent Labs  Lab 08/08/20 0012 08/09/20 0517 08/10/20 0534 08/11/20 0547  AST 58* 47* 52* 38  ALT 39 33 34 29  ALKPHOS 149* 137* 130* 108  BILITOT 8.7* 11.4* 9.0* 6.8*  PROT 6.2* 5.3* 4.8* 5.3*  ALBUMIN 3.0* 2.5* 2.4* 3.2*    CBG: Recent Labs  Lab 08/08/20 1257  GLUCAP 98   Urinalysis    Component Value Date/Time   COLORURINE AMBER (A) 08/08/2020 0013   APPEARANCEUR CLEAR 08/08/2020 0013   LABSPEC 1.027 08/08/2020 0013   PHURINE 5.0 08/08/2020 0013   GLUCOSEU NEGATIVE 08/08/2020 0013   HGBUR NEGATIVE 08/08/2020  0013   BILIRUBINUR SMALL (A) 08/08/2020 0013   KETONESUR 5 (A) 08/08/2020 0013   PROTEINUR 30 (A) 08/08/2020 0013   UROBILINOGEN 1.0 02/01/2009 1447   NITRITE NEGATIVE 08/08/2020 0013   LEUKOCYTESUR NEGATIVE 08/08/2020 0013      Time coordinating discharge: 25 minutes  SIGNED:  Marcellus Scott, MD, FACP, [facility]. [facility]  To contact the attending provider between 7A-7P  or the covering provider during after hours 7P-7A, please log into the web site [email or web address] and access using universal  password for that web site. If you do not have the password, please call the hospital operator.

## 2020-08-12 LAB — BODY FLUID CULTURE: Culture: NO GROWTH

## 2020-09-14 ENCOUNTER — Other Ambulatory Visit: Payer: Self-pay | Admitting: Nurse Practitioner

## 2020-09-14 DIAGNOSIS — M542 Cervicalgia: Secondary | ICD-10-CM

## 2020-10-25 ENCOUNTER — Other Ambulatory Visit: Payer: Self-pay

## 2020-10-25 ENCOUNTER — Emergency Department (HOSPITAL_COMMUNITY)
Admission: EM | Admit: 2020-10-25 | Discharge: 2020-10-25 | Disposition: A | Discharge status: Home / Self Care | Payer: Medicare Other | Attending: Emergency Medicine | Admitting: Emergency Medicine

## 2020-10-25 ENCOUNTER — Encounter (HOSPITAL_COMMUNITY): Payer: Self-pay

## 2020-10-25 DIAGNOSIS — I10 Essential (primary) hypertension: Secondary | ICD-10-CM | POA: Insufficient documentation

## 2020-10-25 DIAGNOSIS — T50905A Adverse effect of unspecified drugs, medicaments and biological substances, initial encounter: Secondary | ICD-10-CM

## 2020-10-25 DIAGNOSIS — Z79899 Other long term (current) drug therapy: Secondary | ICD-10-CM | POA: Diagnosis not present

## 2020-10-25 DIAGNOSIS — M542 Cervicalgia: Secondary | ICD-10-CM | POA: Diagnosis not present

## 2020-10-25 DIAGNOSIS — T402X1A Poisoning by other opioids, accidental (unintentional), initial encounter: Secondary | ICD-10-CM | POA: Insufficient documentation

## 2020-10-25 DIAGNOSIS — R110 Nausea: Secondary | ICD-10-CM | POA: Insufficient documentation

## 2020-10-25 DIAGNOSIS — G8929 Other chronic pain: Secondary | ICD-10-CM | POA: Insufficient documentation

## 2020-10-25 MED ORDER — HYDROMORPHONE HCL 2 MG PO TABS
2.0000 mg | ORAL_TABLET | Freq: Once | ORAL | Status: AC
Start: 2020-10-25 — End: 2020-10-25
  Administered 2020-10-25: 2 mg via ORAL
  Filled 2020-10-25: qty 1

## 2020-10-25 MED ORDER — MORPHINE SULFATE (PF) 4 MG/ML IV SOLN: 4.0000 mg | Freq: Once | INTRAVENOUS | Status: DC

## 2020-10-25 MED ORDER — MORPHINE SULFATE (PF) 4 MG/ML IV SOLN
4.0000 mg | Freq: Once | INTRAVENOUS | Status: AC
  Administered 2020-10-25: 4 mg via INTRAMUSCULAR
  Filled 2020-10-25: qty 1

## 2020-10-25 NOTE — Discharge Instructions (Addendum)
Please monitor your condition carefully and do not hesitate to return for concerning changes, otherwise follow-up with your physician.

## 2020-10-25 NOTE — ED Notes (Signed)
Pt in bed resting, no s/s of distress, respirations even and unlabored

## 2020-10-25 NOTE — ED Triage Notes (Signed)
Pt BIB EMS from home. Pt took dilaudid last night- 2 mg- took 4 PO pills. Pt woke up this morning and "felt like he was having withdrawals". Pt took a total of 8 mg of Narcan. Pt states he is seeing vibrant colors, c/o generalized body pain. Pt states he always has pain all over and takes Suboxone. Pt is A&Ox4, ambulatory at this time.

## 2020-10-25 NOTE — ED Provider Notes (Signed)
[facility]-EMERGENCY DEPT Provider Note   CSN: [id number] Arrival date & time: 10/25/20  1124     History Chief Complaint  Patient presents with  . Drug Overdose    Jared Bass is a 49 y.o. male.  HPI Patient with multiple medical issues including alcoholic cirrhosis, chronic neck pain presents after suddenly developing nausea, cold sensation, extreme discomfort following ingestion of Narcan. He notes that for his chronic pain he typically takes Dilaudid.  Yesterday he took a total of 8 mg today with signs of withdrawal he took Narcan, also 8 mg.  He subsequently developed symptoms as above.  No syncope.  No focal pain or weakness.  He has had 1 episode of vomiting.  He notes that beyond his ongoing neck pain he has no other new discomfort.   He denies suicidal or homicidal ideation.  Past Medical History:  Diagnosis Date  . Acute renal failure (HCC)   . Acute respiratory failure (HCC)    2017  . Acute respiratory failure with hypoxia (HCC)   . Acute saddle pulmonary embolism with acute cor pulmonale (HCC)   . Chronic neck pain   . Closed fracture of nasal bone   . DDD (degenerative disc disease), cervical 05/04/2015  . Faintness   . Hemoptysis   . History of atrial fibrillation   . History of head injury 05/04/2015  . History of tachycardia 05/04/2015  . Hypertension   . Hypotension   . Pulmonary emboli (HCC)    on elequis  . Pulmonary embolism (HCC)   . Right eye injury   . Saddle pulmonary embolus (HCC) 12/13/2015  . Syncope and collapse   . Tachycardia 12/20/2015    Patient Active Problem List   Diagnosis Date Noted  . Nausea & vomiting 08/09/2020  . Thrombocytopenia (HCC) 08/09/2020  . Coagulopathy (HCC) 08/09/2020  . Unspecified atrial fibrillation (HCC) 08/09/2020  . SBP (spontaneous bacterial peritonitis) (HCC) 08/08/2020  . Ascites   . Secondary esophageal varices without bleeding (HCC)   . Portal hypertensive gastropathy (HCC)   .  Decompensated hepatic cirrhosis (HCC) 01/27/2020  . Alcohol abuse   . Common bile duct dilation   . Serum total bilirubin elevated   . Transaminitis 09/10/2017  . Alcohol use disorder, moderate, dependence (HCC) 07/11/2017  . GERD (gastroesophageal reflux disease) 03/12/2017  . Opioid use disorder, moderate, dependence (HCC) 01/22/2017  . Cervical spinal stenosis 05/04/2015  . Cervical foraminal stenosis (right side) 05/04/2015  . Generalized anxiety disorder 05/04/2015    Past Surgical History:  Procedure Laterality Date  . ESOPHAGOGASTRODUODENOSCOPY (EGD) WITH PROPOFOL N/A 01/29/2020   Procedure: ESOPHAGOGASTRODUODENOSCOPY (EGD) WITH PROPOFOL;  Surgeon: Shellia Cleverly, DO;  Location: [facility] ENDOSCOPY;  Service: Gastroenterology;  Laterality: N/A;  . IR PARACENTESIS  01/29/2020  . neck fusion     c5-7  . TONSILLECTOMY         Family History  Problem Relation Age of Onset  . Hypertension Mother   . COPD Mother   . Mesothelioma Father   . COPD Father   . Peripheral vascular disease Brother        Clots in his arteries surgically removed    Social History   Tobacco Use  . Smoking status: Never Smoker  . Smokeless tobacco: Never Used  Substance Use Topics  . Alcohol use: No    Alcohol/week: 0.0 standard drinks  . Drug use: No    Home Medications Prior to Admission medications   Medication Sig Start Date  End Date Taking? Authorizing Provider  albuterol (VENTOLIN HFA) 108 (90 Base) MCG/ACT inhaler INHALE 2 PUFFS INTO THE LUNGS EVERY 6 HOURS AS NEEDED (COUGH). Patient taking differently: Inhale 2 puffs into the lungs every 6 (six) hours as needed for wheezing or shortness of breath. 05/04/19   Inez Catalina, MD  carvedilol (COREG) 6.25 MG tablet TAKE 1 TABLET BY MOUTH 2 TIMES DAILY WITH A MEAL. Patient taking differently: Take 6.25 mg by mouth daily. 06/27/20   Inez Catalina, MD  furosemide (LASIX) 20 MG tablet TAKE 1 TABLET BY MOUTH EVERY DAY 06/27/20   Inez Catalina, MD  gabapentin (NEURONTIN) 300 MG capsule Take 300 mg by mouth 3 (three) times daily. Uses PRN 06/03/20   [provider]  Multiple Vitamins-Minerals (MULTI + OMEGA-3 ADULT GUMMIES PO) Take 1 tablet by mouth daily.    [provider]  polyethylene glycol (MIRALAX / GLYCOLAX) packet Take 17 g by mouth daily. Patient taking differently: Take 17 g by mouth daily as needed for mild constipation. 07/30/17   Arnetha Courser, MD  spironolactone (ALDACTONE) 50 MG tablet TAKE 1 TABLET BY MOUTH EVERY DAY 06/27/20   Inez Catalina, MD    Allergies    Tramadol and Other  Review of Systems   Review of Systems  Constitutional:       Per HPI, otherwise negative  HENT:       Per HPI, otherwise negative  Respiratory:       Per HPI, otherwise negative  Cardiovascular:       Per HPI, otherwise negative  Gastrointestinal: Positive for nausea and vomiting. Negative for abdominal pain.  Endocrine:       Negative aside from HPI  Genitourinary:       Neg aside from HPI   Musculoskeletal:       Per HPI, otherwise negative  Skin: Negative.   Neurological: Positive for weakness and light-headedness. Negative for syncope.    Physical Exam Updated Vital Signs BP 128/83 (BP Location: Left Arm)   Pulse 93   Temp 98.2 F (36.8 C) (Oral)   Resp 20   Ht 5\' 8"  (1.727 m)   Wt 86.2 kg   SpO2 100%   BMI 28.89 kg/m   Physical Exam Vitals and nursing note reviewed.  Constitutional:      General: He is not in acute distress.    Appearance: He is well-developed.  HENT:     Head: Normocephalic and atraumatic.  Eyes:     Conjunctiva/sclera: Conjunctivae normal.  Cardiovascular:     Rate and Rhythm: Normal rate and regular rhythm.  Pulmonary:     Effort: Pulmonary effort is normal. No respiratory distress.     Breath sounds: No stridor.  Abdominal:     General: There is no distension.     Tenderness: There is no abdominal tenderness. There is no guarding.  Skin:    General: Skin  is warm and dry.  Neurological:     Mental Status: He is alert and oriented to person, place, and time.  Psychiatric:        Thought Content: Thought content does not include suicidal ideation. Thought content does not include suicidal plan.      ED Results / Procedures / Treatments   Labs (all labs ordered are listed, but only abnormal results are displayed) Labs Reviewed - No data to display  EKG None  Radiology No results found.  Procedures Procedures   Medications Ordered in ED Medications -  No data to display  ED Course  I have reviewed the triage vital signs and the nursing notes.  Pertinent labs & imaging results that were available during my care of the patient were reviewed by me and considered in my medical decision making (see chart for details).    2:32 PM Patient awake, alert, ambulatory, in no distress, no evidence for continued effects of his drug reaction, no evidence for withdrawal. We discussed importance of following up with his pain management clinic.  Patient requests a course of medication on discharge, but was made aware that this is not possible.  He did however receive 1 dose of his home medication prior to discharge to facilitate avoidance of withdrawal. Without other ongoing symptoms, complaints, patient appropriate for discharge with outpatient follow-up.  Final Clinical Impression(s) / ED Diagnoses Final diagnoses:  Adverse effect of drug, initial encounter     Gerhard Munch, MD 10/25/20 1433

## 2020-12-18 ENCOUNTER — Encounter (HOSPITAL_COMMUNITY): Payer: Self-pay

## 2020-12-18 ENCOUNTER — Other Ambulatory Visit: Payer: Self-pay

## 2020-12-18 ENCOUNTER — Emergency Department (HOSPITAL_COMMUNITY)
Admission: EM | Admit: 2020-12-18 | Discharge: 2020-12-18 | Disposition: A | Discharge status: Home / Self Care | Payer: Medicare Other | Attending: Emergency Medicine | Admitting: Emergency Medicine

## 2020-12-18 DIAGNOSIS — Z5321 Procedure and treatment not carried out due to patient leaving prior to being seen by health care provider: Secondary | ICD-10-CM | POA: Diagnosis not present

## 2020-12-18 DIAGNOSIS — R109 Unspecified abdominal pain: Secondary | ICD-10-CM | POA: Insufficient documentation

## 2020-12-18 LAB — CBC
HCT: 43.6 % (ref 39.0–52.0)
Hemoglobin: 14.4 g/dL (ref 13.0–17.0)
MCH: 34.8 pg — ABNORMAL HIGH (ref 26.0–34.0)
MCHC: 33 g/dL (ref 30.0–36.0)
MCV: 105.3 fL — ABNORMAL HIGH (ref 80.0–100.0)
Platelets: 83 10*3/uL — ABNORMAL LOW (ref 150–400)
RBC: 4.14 MIL/uL — ABNORMAL LOW (ref 4.22–5.81)
RDW: 15.8 % — ABNORMAL HIGH (ref 11.5–15.5)
WBC: 9.8 10*3/uL (ref 4.0–10.5)
nRBC: 0 % (ref 0.0–0.2)

## 2020-12-18 LAB — COMPREHENSIVE METABOLIC PANEL
ALT: 31 U/L (ref 0–44)
AST: 53 U/L — ABNORMAL HIGH (ref 15–41)
Albumin: 2.8 g/dL — ABNORMAL LOW (ref 3.5–5.0)
Alkaline Phosphatase: 173 U/L — ABNORMAL HIGH (ref 38–126)
Anion gap: 5 (ref 5–15)
BUN: 8 mg/dL (ref 6–20)
CO2: 29 mmol/L (ref 22–32)
Calcium: 9 mg/dL (ref 8.9–10.3)
Chloride: 107 mmol/L (ref 98–111)
Creatinine, Ser: 0.96 mg/dL (ref 0.61–1.24)
GFR, Estimated: >60 mL/min (ref >60)
Glucose, Bld: 124 mg/dL — ABNORMAL HIGH (ref 70–99)
Potassium: 4.9 mmol/L (ref 3.5–5.1)
Sodium: 141 mmol/L (ref 135–145)
Total Bilirubin: 9.2 mg/dL — ABNORMAL HIGH (ref 0.3–1.2)
Total Protein: 6.1 g/dL — ABNORMAL LOW (ref 6.5–8.1)

## 2020-12-18 LAB — URINALYSIS, ROUTINE W REFLEX MICROSCOPIC
Glucose, UA: NEGATIVE mg/dL
Hgb urine dipstick: NEGATIVE
Ketones, ur: NEGATIVE mg/dL
Leukocytes,Ua: NEGATIVE
Nitrite: NEGATIVE
Protein, ur: NEGATIVE mg/dL
Specific Gravity, Urine: >1.03 — ABNORMAL HIGH (ref 1.005–1.030)
pH: 5.5 (ref 5.0–8.0)

## 2020-12-18 LAB — LIPASE, BLOOD: Lipase: 36 U/L (ref 11–51)

## 2020-12-18 NOTE — ED Triage Notes (Signed)
Patient BIB EMS for abdominal pain, hx of cirrhosis, reports he thinks he needs a paracentesis, states last one was a few months ago

## 2020-12-18 NOTE — ED Notes (Signed)
Called for repeat VS x3, no response. 

## 2020-12-27 ENCOUNTER — Inpatient Hospital Stay (HOSPITAL_COMMUNITY)
Admission: EM | Admit: 2020-12-27 | Discharge: 2021-01-01 | DRG: 432 | Disposition: A | Discharge status: Home / Self Care | Payer: Medicare Other | Attending: Internal Medicine | Admitting: Internal Medicine

## 2020-12-27 ENCOUNTER — Encounter (HOSPITAL_COMMUNITY): Payer: Self-pay | Admitting: Emergency Medicine

## 2020-12-27 ENCOUNTER — Emergency Department (HOSPITAL_COMMUNITY): Payer: Medicare Other

## 2020-12-27 ENCOUNTER — Other Ambulatory Visit: Payer: Self-pay

## 2020-12-27 DIAGNOSIS — I4891 Unspecified atrial fibrillation: Secondary | ICD-10-CM | POA: Diagnosis present

## 2020-12-27 DIAGNOSIS — R17 Unspecified jaundice: Secondary | ICD-10-CM

## 2020-12-27 DIAGNOSIS — Z79899 Other long term (current) drug therapy: Secondary | ICD-10-CM | POA: Diagnosis not present

## 2020-12-27 DIAGNOSIS — K7031 Alcoholic cirrhosis of liver with ascites: Principal | ICD-10-CM

## 2020-12-27 DIAGNOSIS — Z981 Arthrodesis status: Secondary | ICD-10-CM

## 2020-12-27 DIAGNOSIS — B9620 Unspecified Escherichia coli [E. coli] as the cause of diseases classified elsewhere: Secondary | ICD-10-CM | POA: Diagnosis present

## 2020-12-27 DIAGNOSIS — Z8249 Family history of ischemic heart disease and other diseases of the circulatory system: Secondary | ICD-10-CM

## 2020-12-27 DIAGNOSIS — E876 Hypokalemia: Secondary | ICD-10-CM | POA: Diagnosis present

## 2020-12-27 DIAGNOSIS — F1011 Alcohol abuse, in remission: Secondary | ICD-10-CM | POA: Diagnosis present

## 2020-12-27 DIAGNOSIS — Z825 Family history of asthma and other chronic lower respiratory diseases: Secondary | ICD-10-CM

## 2020-12-27 DIAGNOSIS — D689 Coagulation defect, unspecified: Secondary | ICD-10-CM | POA: Diagnosis present

## 2020-12-27 DIAGNOSIS — I8510 Secondary esophageal varices without bleeding: Secondary | ICD-10-CM | POA: Diagnosis present

## 2020-12-27 DIAGNOSIS — I119 Hypertensive heart disease without heart failure: Secondary | ICD-10-CM | POA: Diagnosis present

## 2020-12-27 DIAGNOSIS — K838 Other specified diseases of biliary tract: Secondary | ICD-10-CM | POA: Diagnosis present

## 2020-12-27 DIAGNOSIS — D6959 Other secondary thrombocytopenia: Secondary | ICD-10-CM | POA: Diagnosis present

## 2020-12-27 DIAGNOSIS — R1084 Generalized abdominal pain: Secondary | ICD-10-CM | POA: Diagnosis not present

## 2020-12-27 DIAGNOSIS — E871 Hypo-osmolality and hyponatremia: Secondary | ICD-10-CM | POA: Diagnosis present

## 2020-12-27 DIAGNOSIS — G8929 Other chronic pain: Secondary | ICD-10-CM | POA: Diagnosis present

## 2020-12-27 DIAGNOSIS — D72819 Decreased white blood cell count, unspecified: Secondary | ICD-10-CM

## 2020-12-27 DIAGNOSIS — K652 Spontaneous bacterial peritonitis: Secondary | ICD-10-CM | POA: Diagnosis present

## 2020-12-27 DIAGNOSIS — K2970 Gastritis, unspecified, without bleeding: Secondary | ICD-10-CM | POA: Diagnosis present

## 2020-12-27 DIAGNOSIS — K766 Portal hypertension: Secondary | ICD-10-CM | POA: Diagnosis present

## 2020-12-27 DIAGNOSIS — R739 Hyperglycemia, unspecified: Secondary | ICD-10-CM | POA: Diagnosis present

## 2020-12-27 DIAGNOSIS — E8809 Other disorders of plasma-protein metabolism, not elsewhere classified: Secondary | ICD-10-CM | POA: Diagnosis present

## 2020-12-27 DIAGNOSIS — R1900 Intra-abdominal and pelvic swelling, mass and lump, unspecified site: Secondary | ICD-10-CM | POA: Diagnosis present

## 2020-12-27 DIAGNOSIS — K3189 Other diseases of stomach and duodenum: Secondary | ICD-10-CM | POA: Diagnosis present

## 2020-12-27 DIAGNOSIS — I480 Paroxysmal atrial fibrillation: Secondary | ICD-10-CM | POA: Diagnosis present

## 2020-12-27 DIAGNOSIS — K7011 Alcoholic hepatitis with ascites: Secondary | ICD-10-CM | POA: Diagnosis present

## 2020-12-27 DIAGNOSIS — Z86711 Personal history of pulmonary embolism: Secondary | ICD-10-CM

## 2020-12-27 DIAGNOSIS — Z20822 Contact with and (suspected) exposure to covid-19: Secondary | ICD-10-CM | POA: Diagnosis present

## 2020-12-27 DIAGNOSIS — D539 Nutritional anemia, unspecified: Secondary | ICD-10-CM | POA: Diagnosis present

## 2020-12-27 DIAGNOSIS — D696 Thrombocytopenia, unspecified: Secondary | ICD-10-CM | POA: Diagnosis not present

## 2020-12-27 HISTORY — PX: IR PARACENTESIS: IMG2679

## 2020-12-27 LAB — CBC WITH DIFFERENTIAL/PLATELET
Abs Immature Granulocytes: 0.03 10*3/uL (ref 0.00–0.07)
Basophils Absolute: 0 10*3/uL (ref 0.0–0.1)
Basophils Relative: 1 %
Eosinophils Absolute: 0.4 10*3/uL (ref 0.0–0.5)
Eosinophils Relative: 13 %
HCT: 37.8 % — ABNORMAL LOW (ref 39.0–52.0)
Hemoglobin: 13.1 g/dL (ref 13.0–17.0)
Immature Granulocytes: 1 %
Lymphocytes Relative: 42 %
Lymphs Abs: 1.5 10*3/uL (ref 0.7–4.0)
MCH: 35.5 pg — ABNORMAL HIGH (ref 26.0–34.0)
MCHC: 34.7 g/dL (ref 30.0–36.0)
MCV: 102.4 fL — ABNORMAL HIGH (ref 80.0–100.0)
Monocytes Absolute: 0.1 10*3/uL (ref 0.1–1.0)
Monocytes Relative: 4 %
Neutro Abs: 1.4 10*3/uL — ABNORMAL LOW (ref 1.7–7.7)
Neutrophils Relative %: 39 %
Platelets: 95 10*3/uL — ABNORMAL LOW (ref 150–400)
RBC: 3.69 MIL/uL — ABNORMAL LOW (ref 4.22–5.81)
RDW: 14.5 % (ref 11.5–15.5)
WBC: 3.4 10*3/uL — ABNORMAL LOW (ref 4.0–10.5)
nRBC: 0 % (ref 0.0–0.2)

## 2020-12-27 LAB — BODY FLUID CELL COUNT WITH DIFFERENTIAL
Eos, Fluid: 4 %
Lymphs, Fluid: 5 %
Monocyte-Macrophage-Serous Fluid: 2 % — ABNORMAL LOW (ref 50–90)
Neutrophil Count, Fluid: 89 % — ABNORMAL HIGH (ref 0–25)
Total Nucleated Cell Count, Fluid: 5975 cu mm — ABNORMAL HIGH (ref 0–1000)

## 2020-12-27 LAB — URINALYSIS, ROUTINE W REFLEX MICROSCOPIC
Bilirubin Urine: NEGATIVE
Glucose, UA: NEGATIVE mg/dL
Hgb urine dipstick: NEGATIVE
Ketones, ur: NEGATIVE mg/dL
Leukocytes,Ua: NEGATIVE
Nitrite: NEGATIVE
Protein, ur: NEGATIVE mg/dL
Specific Gravity, Urine: 1.017 (ref 1.005–1.030)
pH: 5 (ref 5.0–8.0)

## 2020-12-27 LAB — PROTIME-INR
INR: 2.1 — ABNORMAL HIGH (ref 0.8–1.2)
Prothrombin Time: 23.4 seconds — ABNORMAL HIGH (ref 11.4–15.2)

## 2020-12-27 LAB — COMPREHENSIVE METABOLIC PANEL
ALT: 25 U/L (ref 0–44)
AST: 51 U/L — ABNORMAL HIGH (ref 15–41)
Albumin: 2.6 g/dL — ABNORMAL LOW (ref 3.5–5.0)
Alkaline Phosphatase: 141 U/L — ABNORMAL HIGH (ref 38–126)
Anion gap: 8 (ref 5–15)
BUN: 5 mg/dL — ABNORMAL LOW (ref 6–20)
CO2: 31 mmol/L (ref 22–32)
Calcium: 8.8 mg/dL — ABNORMAL LOW (ref 8.9–10.3)
Chloride: 98 mmol/L (ref 98–111)
Creatinine, Ser: 0.89 mg/dL (ref 0.61–1.24)
GFR, Estimated: >60 mL/min (ref >60)
Glucose, Bld: 135 mg/dL — ABNORMAL HIGH (ref 70–99)
Potassium: 3.5 mmol/L (ref 3.5–5.1)
Sodium: 137 mmol/L (ref 135–145)
Total Bilirubin: 10.8 mg/dL — ABNORMAL HIGH (ref 0.3–1.2)
Total Protein: 5.8 g/dL — ABNORMAL LOW (ref 6.5–8.1)

## 2020-12-27 LAB — GLUCOSE, CAPILLARY: Glucose-Capillary: 134 mg/dL — ABNORMAL HIGH (ref 70–99)

## 2020-12-27 LAB — ALBUMIN, PLEURAL OR PERITONEAL FLUID: Albumin, Fluid: <1 g/dL

## 2020-12-27 LAB — RESP PANEL BY RT-PCR (FLU A&B, COVID) ARPGX2
Influenza A by PCR: NEGATIVE
Influenza B by PCR: NEGATIVE
SARS Coronavirus 2 by RT PCR: NEGATIVE

## 2020-12-27 LAB — APTT: aPTT: 42 seconds — ABNORMAL HIGH (ref 24–36)

## 2020-12-27 LAB — LACTATE DEHYDROGENASE, PLEURAL OR PERITONEAL FLUID: LD, Fluid: 96 U/L — ABNORMAL HIGH (ref 3–23)

## 2020-12-27 LAB — LIPASE, BLOOD: Lipase: 36 U/L (ref 11–51)

## 2020-12-27 MED ORDER — SODIUM CHLORIDE 0.9 % IV SOLN
2.0000 g | INTRAVENOUS | Status: DC
  Filled 2020-12-27 (×2): qty 20

## 2020-12-27 MED ORDER — IOHEXOL 300 MG/ML  SOLN
100.0000 mL | Freq: Once | INTRAMUSCULAR | Status: AC | PRN
  Administered 2020-12-27: 100 mL via INTRAVENOUS

## 2020-12-27 MED ORDER — SODIUM CHLORIDE 0.9 % IV SOLN
1.0000 g | Freq: Once | INTRAVENOUS | Status: AC
  Administered 2020-12-27: 1 g via INTRAVENOUS
  Filled 2020-12-27: qty 10

## 2020-12-27 MED ORDER — HYDROMORPHONE HCL 1 MG/ML IJ SOLN
1.0000 mg | Freq: Once | INTRAMUSCULAR | Status: AC
  Administered 2020-12-27: 1 mg via INTRAVENOUS
  Filled 2020-12-27: qty 1

## 2020-12-27 MED ORDER — METOCLOPRAMIDE HCL 5 MG/ML IJ SOLN
10.0000 mg | Freq: Once | INTRAMUSCULAR | Status: AC
  Administered 2020-12-27: 10 mg via INTRAVENOUS
  Filled 2020-12-27: qty 2

## 2020-12-27 MED ORDER — ONDANSETRON HCL 4 MG/2ML IJ SOLN
4.0000 mg | Freq: Once | INTRAMUSCULAR | Status: AC
  Administered 2020-12-27: 4 mg via INTRAVENOUS
  Filled 2020-12-27: qty 2

## 2020-12-27 MED ORDER — HYDROMORPHONE HCL 1 MG/ML IJ SOLN
1.0000 mg | INTRAMUSCULAR | Status: DC | PRN
  Administered 2020-12-28 – 2020-12-30 (×4): 1 mg via INTRAVENOUS
  Filled 2020-12-27 (×4): qty 1

## 2020-12-27 MED ORDER — LIDOCAINE HCL 1 % IJ SOLN
INTRAMUSCULAR | Status: AC | PRN
  Administered 2020-12-27: 10 mL

## 2020-12-27 MED ORDER — PROCHLORPERAZINE EDISYLATE 10 MG/2ML IJ SOLN: 5.0000 mg | Freq: Four times a day (QID) | INTRAMUSCULAR | Status: DC | PRN

## 2020-12-27 MED ORDER — LIDOCAINE HCL 1 % IJ SOLN
INTRAMUSCULAR | Status: AC
  Filled 2020-12-27: qty 20

## 2020-12-27 NOTE — Procedures (Signed)
PROCEDURE SUMMARY:  Successful US guided paracentesis from right abdomen Yielded 1.1 L of dark yellow fluid.  No immediate complications.  Pt tolerated well.   Specimen sent for labs.  EBL < 1 mL  Mickie Kay, NP 12/27/2020 11:50 AM

## 2020-12-27 NOTE — ED Notes (Signed)
Patient transported to IR 

## 2020-12-27 NOTE — H&P (Signed)
History and Physical    AVINASH MALTOS SFK:[id number] DOB: [birth year] DOA: 12/27/2020  PCP: Jonetta Speak., MD Patient coming from: Home  Chief Complaint: Abdominal pain  HPI: Jared Bass is a 50 y.o. male with medical history significant of alcoholic cirrhosis, prior alcohol use-quit drinking alcohol after hospitalization in June 2021, SBP, esophageal varices, A. fib not on anticoagulation, prior SBP presented to the ED with complaints of abdominal pain and swelling, nausea.  Vital signs stable, no fever.  Labs showing WBC 3.4, hemoglobin 13.1, platelet count 95K (chronically low and improved).  Sodium 137, potassium 3.5, chloride 98, bicarb 31, BUN 5, creatinine 0.8, glucose 135.  Albumin 2.6.  AST 51, ALT 25, alk phos 141, T bili 10.8 (chronically elevated).  Lipase normal.  INR 2.1.  COVID and influenza PCR negative.  UA without signs of infection.  Chest x-ray showing cardiomegaly, no pulmonary venous congestion, low lung volumes with mild bibasilar atelectasis.  Patient was sent to IR for fluoroscopy guided paracentesis.  1.1 L dark yellow fluid removed.  Peritoneal fluid analysis labs showing LDH 96, cell count 5975, neutrophil count 89%, albumin <1.0, culture pending. CT abdomen pelvis showing findings consistent with hepatic cirrhosis with evidence of portal hypertension, small volume abdominopelvic ascites and anasarca.  Also showing slightly decreased diffuse colonic wall thickening extending from the ascending colon to the proximal sigmoid colon which may reflect portal colopathy or less likely other infectious or inflammatory colitis.  Dilated gallbladder and mild intra and extrahepatic biliary ductal dilation similar to prior examination and likely sequela of hepatocellular disease, acalculous cholecystitis not excluded. Patient was given ceftriaxone Dilaudid, Reglan, and Zofran.  Dr. Alessandra Bevels from gastroenterology consulted.  Patient reports 1 day history of severe generalized abdominal  pain and abdominal distention.  He has felt nauseous but not vomited.  No diarrhea.  No fevers.  Reports compliance with home Lasix and spironolactone and states this morning he took an extra tablet of Lasix.  He quit drinking alcohol a year ago.  States he had fluid removed from his abdomen last year in July and again this year in January.  He has no other complaints.  Denies cough, shortness of breath, or chest pain.  Review of Systems:  All systems reviewed and apart from history of presenting illness, are negative.  Past Medical History:  Diagnosis Date  . Acute renal failure ([place])   . Acute respiratory failure ([place])    2017  . Acute respiratory failure with hypoxia ([place])   . Acute saddle pulmonary embolism with acute cor pulmonale (HCC)   . Chronic neck pain   . Closed fracture of nasal bone   . DDD (degenerative disc disease), cervical 05/04/2015  . Faintness   . Hemoptysis   . History of atrial fibrillation   . History of head injury 05/04/2015  . History of tachycardia 05/04/2015  . Hypertension   . Hypotension   . Pulmonary emboli ([place])    on elequis  . Pulmonary embolism ([place])   . Right eye injury   . Saddle pulmonary embolus ([place]) 12/13/2015  . Syncope and collapse   . Tachycardia 12/20/2015    Past Surgical History:  Procedure Laterality Date  . ESOPHAGOGASTRODUODENOSCOPY (EGD) WITH PROPOFOL N/A 01/29/2020   Procedure: ESOPHAGOGASTRODUODENOSCOPY (EGD) WITH PROPOFOL;  Surgeon: Lavena Bullion, DO;  Location: [facility];  Service: Gastroenterology;  Laterality: N/A;  . IR PARACENTESIS  01/29/2020  . IR PARACENTESIS  12/27/2020  . neck fusion     c5-7  .  TONSILLECTOMY       reports that he has never smoked. He has never used smokeless tobacco. He reports that he does not drink alcohol and does not use drugs.  Allergies  Allergen Reactions  . Tramadol Anaphylaxis and Rash  . Other Other (See Comments)    Rembrant Whitening strips  "fat lips"    Family History   Problem Relation Age of Onset  . Hypertension Mother   . COPD Mother   . Mesothelioma Father   . COPD Father   . Peripheral vascular disease Brother        Clots in his arteries surgically removed    Prior to Admission medications   Medication Sig Start Date End Date Taking? Authorizing Provider  albuterol (VENTOLIN HFA) 108 (90 Base) MCG/ACT inhaler INHALE 2 PUFFS INTO THE LUNGS EVERY 6 HOURS AS NEEDED (COUGH). Patient taking differently: Inhale 2 puffs into the lungs every 6 (six) hours as needed for wheezing or shortness of breath. 05/04/19   Sid Falcon, MD  carvedilol (COREG) 6.25 MG tablet TAKE 1 TABLET BY MOUTH 2 TIMES DAILY WITH A MEAL. Patient taking differently: Take 6.25 mg by mouth daily. 06/27/20   Sid Falcon, MD  furosemide (LASIX) 20 MG tablet TAKE 1 TABLET BY MOUTH EVERY DAY 06/27/20   Sid Falcon, MD  gabapentin (NEURONTIN) 300 MG capsule Take 300 mg by mouth 3 (three) times daily. Uses PRN 06/03/20   [provider]  Multiple Vitamins-Minerals (MULTI + OMEGA-3 ADULT GUMMIES PO) Take 1 tablet by mouth daily.    [provider]  polyethylene glycol (MIRALAX / GLYCOLAX) packet Take 17 g by mouth daily. Patient taking differently: Take 17 g by mouth daily as needed for mild constipation. 07/30/17   Lorella Nimrod, MD  spironolactone (ALDACTONE) 50 MG tablet TAKE 1 TABLET BY MOUTH EVERY DAY 06/27/20   Sid Falcon, MD    Physical Exam: Vitals:   12/27/20 1755 12/27/20 1800 12/27/20 1845 12/27/20 1930  BP: 139/81 133/80 134/77 132/83  Pulse: 98 96 100 100  Resp: '16 10 10 12  ' Temp:      TempSrc:      SpO2: 98% 95% 96% 96%  Weight:      Height:        Physical Exam Constitutional:      General: He is not in acute distress. HENT:     Head: Normocephalic and atraumatic.  Eyes:     Extraocular Movements: Extraocular movements intact.     Conjunctiva/sclera: Conjunctivae normal.  Cardiovascular:     Rate and Rhythm: Normal rate and  regular rhythm.     Pulses: Normal pulses.  Pulmonary:     Effort: Pulmonary effort is normal.     Breath sounds: Normal breath sounds.  Abdominal:     General: Bowel sounds are normal. There is distension.     Palpations: Abdomen is soft.     Tenderness: There is abdominal tenderness.     Comments: Generalized tenderness to palpation  Musculoskeletal:        General: No swelling or tenderness.     Cervical back: Normal range of motion and neck supple.  Skin:    General: Skin is warm and dry.  Neurological:     General: No focal deficit present.     Mental Status: He is alert and oriented to person, place, and time.     Labs on Admission: I have personally reviewed following labs and imaging studies  CBC:  Recent Labs  Lab 12/27/20 0810  WBC 3.4*  NEUTROABS 1.4*  HGB 13.1  HCT 37.8*  MCV 102.4*  PLT 95*   Basic Metabolic Panel: Recent Labs  Lab 12/27/20 0810  NA 137  K 3.5  CL 98  CO2 31  GLUCOSE 135*  BUN 5*  CREATININE 0.89  CALCIUM 8.8*   GFR: Estimated Creatinine Clearance: 103.7 mL/min (by C-G formula based on SCr of 0.89 mg/dL). Liver Function Tests: Recent Labs  Lab 12/27/20 0810  AST 51*  ALT 25  ALKPHOS 141*  BILITOT 10.8*  PROT 5.8*  ALBUMIN 2.6*   Recent Labs  Lab 12/27/20 0810  LIPASE 36   No results for input(s): AMMONIA in the last 168 hours. Coagulation Profile: Recent Labs  Lab 12/27/20 0810  INR 2.1*   Cardiac Enzymes: No results for input(s): CKTOTAL, CKMB, CKMBINDEX, TROPONINI in the last 168 hours. BNP (last 3 results) No results for input(s): PROBNP in the last 8760 hours. HbA1C: No results for input(s): HGBA1C in the last 72 hours. CBG: No results for input(s): GLUCAP in the last 168 hours. Lipid Profile: No results for input(s): CHOL, HDL, LDLCALC, TRIG, CHOLHDL, LDLDIRECT in the last 72 hours. Thyroid Function Tests: No results for input(s): TSH, T4TOTAL, FREET4, T3FREE, THYROIDAB in the last 72 hours. Anemia  Panel: No results for input(s): VITAMINB12, FOLATE, FERRITIN, TIBC, IRON, RETICCTPCT in the last 72 hours. Urine analysis:    Component Value Date/Time   COLORURINE AMBER (A) 12/27/2020 1202   APPEARANCEUR HAZY (A) 12/27/2020 1202   LABSPEC 1.017 12/27/2020 1202   PHURINE 5.0 12/27/2020 1202   GLUCOSEU NEGATIVE 12/27/2020 1202   HGBUR NEGATIVE 12/27/2020 1202   BILIRUBINUR NEGATIVE 12/27/2020 1202   KETONESUR NEGATIVE 12/27/2020 1202   PROTEINUR NEGATIVE 12/27/2020 1202   UROBILINOGEN 1.0 02/01/2009 1447   NITRITE NEGATIVE 12/27/2020 1202   LEUKOCYTESUR NEGATIVE 12/27/2020 1202    Radiological Exams on Admission: CT ABDOMEN PELVIS W CONTRAST  Result Date: 12/27/2020 CLINICAL DATA:  Left upper quadrant abdominal pain. EXAM: CT ABDOMEN AND PELVIS WITH CONTRAST TECHNIQUE: Multidetector CT imaging of the abdomen and pelvis was performed using the standard protocol following bolus administration of intravenous contrast. CONTRAST:  135m OMNIPAQUE IOHEXOL 300 MG/ML  SOLN COMPARISON:  August 08, 2020 FINDINGS: Lower chest: Hypoventilatory change in the dependent lungs. Hepatobiliary: Cirrhotic hepatic morphology. Stable scattered tiny hypodensities in the liver, likely cysts. Gallbladder is dilated similar to prior examination measuring 6.5 cm in maximum diameter. There is mild intra and extrahepatic biliary ductal dilation with the common duct measuring up to 11-12 mm, similar multiple priors and and previously evaluated with MRI abdomen on January 27, 2020 sequela of patient's dilated gallbladder. Pancreas: No pancreatic ductal dilation. No evidence of acute pancreatic inflammation. Spleen: No splenomegaly.  Benign splenic cleft. Adrenals/Urinary Tract: Adrenal glands are unremarkable. Kidneys are normal, without renal calculi, solid enhancing lesion, or hydronephrosis. Bladder is unremarkable for degree of distension. Stomach/Bowel: Stomach is grossly unremarkable. No pathologic dilation of small  bowel. The appendix appears unremarkable. Mild diffuse colonic wall thickening extending from the ascending colon to the proximal sigmoid colon. Vascular/Lymphatic: No abdominal aortic aneurysm. Recannulized periumbilical vein. Gastric, esophageal, and perisplenic varices. The portal, splenic and superior mesenteric veins are patent. Prominent upper abdominal lymph nodes, similar to prior, not pathologically enlarged by size criteria and favored reactive. No pathologically enlarged abdominal or pelvic lymph nodes. Reproductive: Prostate is unremarkable. Other: Small volume abdominopelvic ascites. No pneumoperitoneum. No portal venous gas. No pneumatosis. Anasarca.  Musculoskeletal: No acute or significant osseous abnormality. Right gynecomastia partially visualized. IMPRESSION: 1. Cirrhotic hepatic morphology with evidence of portal hypertension including abdominal collateralized vasculature, small volume abdominopelvic ascites and anasarca. 2. Slightly decreased diffuse colonic wall thickening extending from the ascending colon to the proximal sigmoid colon, which may reflect portal colopathy or less likely other infectious or inflammatory colitis. 3. Dilated gallbladder and mild intra and extrahepatic biliary ductal dilation, similar to prior examination and likely sequela of patient's hepatocellular disease. However acalculous cholecystitis not excluded, consider further evaluation with nuclear medicine HIDA scan. Electronically Signed   By: Dahlia Bailiff MD   On: 12/27/2020 14:42   DG Chest Port 1 View  Result Date: 12/27/2020 CLINICAL DATA:  Shortness of breath. EXAM: PORTABLE CHEST 1 VIEW COMPARISON:  Chest x-ray 01/27/2020. FINDINGS: Mediastinum and hilar structures normal. Cardiomegaly. No pulmonary venous congestion. Low lung volumes with mild bibasilar atelectasis. No focal infiltrate. No pleural effusion or pneumothorax. Prior cervical spine fusion. IMPRESSION: 1.  Cardiomegaly.  No pulmonary venous  congestion. 2.  Low lung volumes with mild bibasilar atelectasis. Electronically Signed   By: Marcello Moores  Register   On: 12/27/2020 08:41   IR Paracentesis  Result Date: 12/27/2020 Theresa Duty, NP     12/27/2020 11:51 AM PROCEDURE SUMMARY: Successful US guided paracentesis from right abdomen Yielded 1.1 L of dark yellow fluid. No immediate complications. Pt tolerated well. Specimen sent for labs. EBL < 1 mL Theresa Duty, NP 12/27/2020 11:50 AM    EKG: Independently reviewed.  Sinus rhythm, borderline QT prolongation.  Assessment/Plan Principal Problem:   SBP (spontaneous bacterial peritonitis) ([place]) Active Problems:   Thrombocytopenia (HCC)   Coagulopathy (HCC)   Unspecified atrial fibrillation (HCC)   Leukopenia   Decompensated alcoholic liver cirrhosis complicated by ascites/SBP, leukopenia, thrombocytopenia, coagulopathy Underwent paracentesis and 1.1 L dark yellow fluid removed.  Ascitic fluid analysis labs showing cell count 5975, neutrophil count 89%, albumin <1.0, LDH 96, culture pending.  WBC 3.4.  Platelet count 95K, improved from baseline. INR 2.1.  No bleeding reported. AST 51, ALT 25, alk phos 141, T bili 10.8 (chronically elevated). -Continue ceftriaxone for SBP.  Ascitic fluid culture pending.  Resume spironolactone and Lasix after pharmacy med rec is done.  [facility] consulted.  Continue to monitor CBC and CMP.  Alcohol use disorder in remission -Continue to encourage abstinence  Paroxysmal A. Fib Currently in sinus rhythm. -Resume carvedilol after pharmacy med rec is done.  Not on anticoagulation due to chronic thrombocytopenia and coagulopathy.  DVT prophylaxis: SCDs Code Status: Full code Family Communication: No family available at this time. Disposition Plan: Status is: Inpatient  Remains inpatient appropriate because:Inpatient level of care appropriate due to severity of illness  Dispo: The patient is from: Home              Anticipated d/c is to: Home               Patient currently is not medically stable to d/c.   Difficult to place patient No  Consults called: Gastroenterology Level of care: Level of care: Med-Surg  The medical decision making on this patient was of high complexity and the patient is at high risk for clinical deterioration, therefore this is a level 3 visit.  Shela Leff MD [facility]  If 7PM-7AM, please contact night-coverage [email or web address]  12/27/2020, 8:17 PM

## 2020-12-27 NOTE — ED Notes (Signed)
Pt provided urinal for UA.

## 2020-12-27 NOTE — ED Provider Notes (Signed)
6:40 PM Care of the patient assumed at signout.  Now, on evaluation the patient continues to complain of abdominal pain.  Paracentesis results now available, notable for elevated leukocyte count, neutrophil count, and given his persistent pain, history of SBP, he will start antibiotics, be admitted for same.   Gerhard Munch, MD 12/27/20 [phone]

## 2020-12-27 NOTE — ED Triage Notes (Signed)
Patient brought in by [facility] for abdominal pain and nausea onset of 4 am today after eating some candy. Abdomen tender and distended. Hx of cirrhosis.

## 2020-12-27 NOTE — ED Provider Notes (Signed)
[facility] EMERGENCY DEPARTMENT Provider Note   CSN: [id number] Arrival date & time: 12/27/20  0758     History Chief Complaint  Patient presents with   Abdominal Pain    Jared Bass is a 50 y.o. male.  Patient is a 50 year old male with a history of alcoholic cirrhosis, PE, atrial fibrillation not on anticoagulation, prior SBP who is presenting today with complaint of abdominal pain and swelling.  Patient has intermittent issues with abdominal pain, nausea and vomiting but was doing okay yesterday until earlier this morning when he started having diffuse abdominal discomfort and nausea.  The pain is cramping and deep throughout the abdomen.  There is no one location that hurts worse than another.  He denies any radiation of the pain.  Any palpation or movement seems to make the pain worse.  He also noticed swelling in his abdomen concerning for ascites.  When his abdomen is swollen like this he also notes that he feels short of breath.  He denies any cough, congestion, fever or chest pain.  He has not noticed any new swelling in his legs but he did take a double dose of spironolactone this morning due to the swelling in his abdomen.  He denies any urinary symptoms.  His last bowel movement was yesterday and normal in appearance and there was no diarrhea or constipation.  His stools have not been black and he has not been vomiting any blood.  He is currently being seen at [facility] for consideration of liver transplant.  Last alcohol use was 12/2019  The history is provided by the patient and medical records.  Abdominal Pain     Past Medical History:  Diagnosis Date   Acute renal failure (HCC)    Acute respiratory failure (HCC)    2017   Acute respiratory failure with hypoxia (HCC)    Acute saddle pulmonary embolism with acute cor pulmonale (HCC)    Chronic neck pain    Closed fracture of nasal bone    DDD (degenerative disc disease), cervical 05/04/2015    Faintness    Hemoptysis    History of atrial fibrillation    History of head injury 05/04/2015   History of tachycardia 05/04/2015   Hypertension    Hypotension    Pulmonary emboli (HCC)    on elequis   Pulmonary embolism (HCC)    Right eye injury    Saddle pulmonary embolus (HCC) 12/13/2015   Syncope and collapse    Tachycardia 12/20/2015    Patient Active Problem List   Diagnosis Date Noted   Nausea & vomiting 08/09/2020   Thrombocytopenia (HCC) 08/09/2020   Coagulopathy (HCC) 08/09/2020   Unspecified atrial fibrillation (HCC) 08/09/2020   SBP (spontaneous bacterial peritonitis) (HCC) 08/08/2020   Ascites    Secondary esophageal varices without bleeding (HCC)    Portal hypertensive gastropathy (HCC)    Decompensated hepatic cirrhosis (HCC) 01/27/2020   Alcohol abuse    Common bile duct dilation    Serum total bilirubin elevated    Transaminitis 09/10/2017   Alcohol use disorder, moderate, dependence (HCC) 07/11/2017   GERD (gastroesophageal reflux disease) 03/12/2017   Opioid use disorder, moderate, dependence (HCC) 01/22/2017   Cervical spinal stenosis 05/04/2015   Cervical foraminal stenosis (right side) 05/04/2015   Generalized anxiety disorder 05/04/2015    Past Surgical History:  Procedure Laterality Date   ESOPHAGOGASTRODUODENOSCOPY (EGD) WITH PROPOFOL N/A 01/29/2020   Procedure: ESOPHAGOGASTRODUODENOSCOPY (EGD) WITH PROPOFOL;  Surgeon: Shellia Cleverly, DO;  Location: [facility] ENDOSCOPY;  Service: Gastroenterology;  Laterality: N/A;   IR PARACENTESIS  01/29/2020   neck fusion     c5-7   TONSILLECTOMY         Family History  Problem Relation Age of Onset   Hypertension Mother    COPD Mother    Mesothelioma Father    COPD Father    Peripheral vascular disease Brother        Clots in his arteries surgically removed    Social History   Tobacco Use   Smoking status: Never   Smokeless tobacco: Never  Substance Use Topics   Alcohol use: No    Alcohol/week:  0.0 standard drinks   Drug use: No    Home Medications Prior to Admission medications   Medication Sig Start Date End Date Taking? Authorizing Provider  albuterol (VENTOLIN HFA) 108 (90 Base) MCG/ACT inhaler INHALE 2 PUFFS INTO THE LUNGS EVERY 6 HOURS AS NEEDED (COUGH). Patient taking differently: Inhale 2 puffs into the lungs every 6 (six) hours as needed for wheezing or shortness of breath. 05/04/19   Inez Catalina, MD  carvedilol (COREG) 6.25 MG tablet TAKE 1 TABLET BY MOUTH 2 TIMES DAILY WITH A MEAL. Patient taking differently: Take 6.25 mg by mouth daily. 06/27/20   Inez Catalina, MD  furosemide (LASIX) 20 MG tablet TAKE 1 TABLET BY MOUTH EVERY DAY 06/27/20   Inez Catalina, MD  gabapentin (NEURONTIN) 300 MG capsule Take 300 mg by mouth 3 (three) times daily. Uses PRN 06/03/20   [provider]  Multiple Vitamins-Minerals (MULTI + OMEGA-3 ADULT GUMMIES PO) Take 1 tablet by mouth daily.    [provider]  polyethylene glycol (MIRALAX / GLYCOLAX) packet Take 17 g by mouth daily. Patient taking differently: Take 17 g by mouth daily as needed for mild constipation. 07/30/17   Arnetha Courser, MD  spironolactone (ALDACTONE) 50 MG tablet TAKE 1 TABLET BY MOUTH EVERY DAY 06/27/20   Inez Catalina, MD    Allergies    Tramadol and Other  Review of Systems   Review of Systems  Gastrointestinal:  Positive for abdominal pain.  All other systems reviewed and are negative.  Physical Exam Updated Vital Signs BP 128/84 (BP Location: Left Arm)   Pulse 72   Temp 97.9 F (36.6 C) (Oral)   Resp 17   Ht 5\' 8"  (1.727 m)   Wt 86.2 kg   SpO2 98%   BMI 28.89 kg/m   Physical Exam Vitals and nursing note reviewed.  Constitutional:      General: He is not in acute distress.    Appearance: He is well-developed. He is ill-appearing.  HENT:     Head: Normocephalic and atraumatic.     Mouth/Throat:     Mouth: Mucous membranes are moist.  Eyes:     General: Scleral icterus  present.     Conjunctiva/sclera: Conjunctivae normal.     Pupils: Pupils are equal, round, and reactive to light.  Cardiovascular:     Rate and Rhythm: Normal rate and regular rhythm.     Heart sounds: No murmur heard. Pulmonary:     Effort: Pulmonary effort is normal. No respiratory distress.     Breath sounds: Normal breath sounds. No wheezing or rales.  Abdominal:     General: Bowel sounds are normal. There is distension.     Palpations: Abdomen is soft. There is fluid wave.     Tenderness: There is generalized abdominal tenderness. There is  guarding. There is no right CVA tenderness, left CVA tenderness or rebound.  Musculoskeletal:        General: No tenderness. Normal range of motion.     Cervical back: Normal range of motion and neck supple.     Right lower leg: No edema.     Left lower leg: No edema.  Skin:    General: Skin is warm and dry.     Findings: No erythema or rash.     Comments: Jaundiced  Neurological:     Mental Status: He is alert and oriented to person, place, and time. Mental status is at baseline.  Psychiatric:        Mood and Affect: Mood normal.        Behavior: Behavior normal.    ED Results / Procedures / Treatments   Labs (all labs ordered are listed, but only abnormal results are displayed) Labs Reviewed  CBC WITH DIFFERENTIAL/PLATELET - Abnormal; Notable for the following components:      Result Value   WBC 3.4 (*)    RBC 3.69 (*)    HCT 37.8 (*)    MCV 102.4 (*)    MCH 35.5 (*)    Platelets 95 (*)    Neutro Abs 1.4 (*)    All other components within normal limits  COMPREHENSIVE METABOLIC PANEL - Abnormal; Notable for the following components:   Glucose, Bld 135 (*)    BUN 5 (*)    Calcium 8.8 (*)    Total Protein 5.8 (*)    Albumin 2.6 (*)    AST 51 (*)    Alkaline Phosphatase 141 (*)    Total Bilirubin 10.8 (*)    All other components within normal limits  RESP PANEL BY RT-PCR (FLU A&B, COVID) ARPGX2  LIPASE, BLOOD  URINALYSIS,  ROUTINE W REFLEX MICROSCOPIC  PROTIME-INR  APTT    EKG EKG Interpretation  Date/Time:  Tuesday December 27 2020 08:04:04 EDT Ventricular Rate:  70 PR Interval:  188 QRS Duration: 97 QT Interval:  455 QTC Calculation: 491 R Axis:   71 Text Interpretation: Sinus rhythm new  Borderline prolonged QT interval Confirmed by Gwyneth SproutPlunkett, Marylu Dudenhoeffer ([id number]) on 12/27/2020 8:31:44 AM  Radiology CT ABDOMEN PELVIS W CONTRAST  Result Date: 12/27/2020 CLINICAL DATA:  Left upper quadrant abdominal pain. EXAM: CT ABDOMEN AND PELVIS WITH CONTRAST TECHNIQUE: Multidetector CT imaging of the abdomen and pelvis was performed using the standard protocol following bolus administration of intravenous contrast. CONTRAST:  100mL OMNIPAQUE IOHEXOL 300 MG/ML  SOLN COMPARISON:  August 08, 2020 FINDINGS: Lower chest: Hypoventilatory change in the dependent lungs. Hepatobiliary: Cirrhotic hepatic morphology. Stable scattered tiny hypodensities in the liver, likely cysts. Gallbladder is dilated similar to prior examination measuring 6.5 cm in maximum diameter. There is mild intra and extrahepatic biliary ductal dilation with the common duct measuring up to 11-12 mm, similar multiple priors and and previously evaluated with MRI abdomen on January 27, 2020 sequela of patient's dilated gallbladder. Pancreas: No pancreatic ductal dilation. No evidence of acute pancreatic inflammation. Spleen: No splenomegaly.  Benign splenic cleft. Adrenals/Urinary Tract: Adrenal glands are unremarkable. Kidneys are normal, without renal calculi, solid enhancing lesion, or hydronephrosis. Bladder is unremarkable for degree of distension. Stomach/Bowel: Stomach is grossly unremarkable. No pathologic dilation of small bowel. The appendix appears unremarkable. Mild diffuse colonic wall thickening extending from the ascending colon to the proximal sigmoid colon. Vascular/Lymphatic: No abdominal aortic aneurysm. Recannulized periumbilical vein. Gastric, esophageal,  and perisplenic varices. The portal, splenic and  superior mesenteric veins are patent. Prominent upper abdominal lymph nodes, similar to prior, not pathologically enlarged by size criteria and favored reactive. No pathologically enlarged abdominal or pelvic lymph nodes. Reproductive: Prostate is unremarkable. Other: Small volume abdominopelvic ascites. No pneumoperitoneum. No portal venous gas. No pneumatosis. Anasarca. Musculoskeletal: No acute or significant osseous abnormality. Right gynecomastia partially visualized. IMPRESSION: 1. Cirrhotic hepatic morphology with evidence of portal hypertension including abdominal collateralized vasculature, small volume abdominopelvic ascites and anasarca. 2. Slightly decreased diffuse colonic wall thickening extending from the ascending colon to the proximal sigmoid colon, which may reflect portal colopathy or less likely other infectious or inflammatory colitis. 3. Dilated gallbladder and mild intra and extrahepatic biliary ductal dilation, similar to prior examination and likely sequela of patient's hepatocellular disease. However acalculous cholecystitis not excluded, consider further evaluation with nuclear medicine HIDA scan. Electronically Signed   By: Maudry Mayhew MD   On: 12/27/2020 14:42   DG Chest Port 1 View  Result Date: 12/27/2020 CLINICAL DATA:  Shortness of breath. EXAM: PORTABLE CHEST 1 VIEW COMPARISON:  Chest x-ray 01/27/2020. FINDINGS: Mediastinum and hilar structures normal. Cardiomegaly. No pulmonary venous congestion. Low lung volumes with mild bibasilar atelectasis. No focal infiltrate. No pleural effusion or pneumothorax. Prior cervical spine fusion. IMPRESSION: 1.  Cardiomegaly.  No pulmonary venous congestion. 2.  Low lung volumes with mild bibasilar atelectasis. Electronically Signed   By: Maisie Fus  Register   On: 12/27/2020 08:41   IR Paracentesis  Result Date: 12/27/2020 Mickie Kay, NP     12/27/2020 11:51 AM PROCEDURE SUMMARY:  Successful US guided paracentesis from right abdomen Yielded 1.1 L of dark yellow fluid. No immediate complications. Pt tolerated well. Specimen sent for labs. EBL < 1 mL Mickie Kay, NP 12/27/2020 11:50 AM    Procedures Procedures   Medications Ordered in ED Medications  HYDROmorphone (DILAUDID) injection 1 mg (has no administration in time range)  ondansetron (ZOFRAN) injection 4 mg (has no administration in time range)    ED Course  I have reviewed the triage vital signs and the nursing notes.  Pertinent labs & imaging results that were available during my care of the patient were reviewed by me and considered in my medical decision making (see chart for details).    MDM Rules/Calculators/A&P                          Patient with a history of alcoholic cirrhosis who is presenting today with abdominal pain and swelling that started overnight.  He denies any fevers, vomiting or black or bloody stools.  He has been taking his medications as prescribed.  Patient also has a history of chronic pain is on Dilaudid for that multiple times a day.  He last took pain medicine last night around 10 PM.  Patient has evidence of ascites and diffuse tenderness in his abdomen.  He otherwise does not have significant findings for fluid overload.  He does have a prior history of SBP in 2/22 that cultures were negative but he was treated with ceftriaxone and Cipro for 1 month.  Today's symptoms sound similar to his prior presentations.  Will need to rule out SBP.  Labs and chest x-ray pending.  10:15 AM Patient's COVID is negative, CBC with leukopenia of 3.4, thrombocytopenia with a platelet count of 95 which is improved from prior and hemoglobin of 13.  CMP with minimal AST elevation of 51, total bilirubin of 10, normal creatinine and albumin of  2.6.  Lipase within normal limits.  Chest x-ray showing some cardiomegaly but no other acute findings.  EKG with borderline prolonged QT but no other acute  findings.  On repeat evaluation patient is still having diffuse abdominal pain and nausea.  Given his prior presentation and concern for SBP attempted to find a fluid pocket by bedside ultrasound but there was no distinct pocket that could be done at bedside.  We will send patient to IR for fluoroscopy guided paracentesis to check fluids and ensure no new SBP.  Patient was given an additional dose of pain control.  4:19 PM CT showing much of the same pathology as prior.  They cannot rule out a COVID calculus cholecystitis.  Patient has more diffuse tenderness.  In IR he did have 1 L removed.  Fluid samples are still returning.  LDH is 96 and albumin is less than 1.  Cell counts are still pending.  We will need these to determine if patient needs any antibiotics.  Given he is having ongoing pain and nausea patient may need admission regardless.  MDM   Amount and/or Complexity of Data Reviewed Clinical lab tests: ordered and reviewed Tests in the radiology section of CPT: ordered and reviewed Tests in the medicine section of CPT: ordered and reviewed Decide to obtain previous medical records or to obtain history from someone other than the patient: yes Obtain history from someone other than the patient: yes Review and summarize past medical records: yes Independent visualization of images, tracings, or specimens: yes     final Clinical Impression(s) / ED Diagnoses Final diagnoses:  Jaundice  Generalized abdominal pain  Alcoholic cirrhosis of liver with ascites [facility])    Rx / DC Orders ED Discharge Orders     None        Gwyneth Sprout, MD 12/27/20 1621

## 2020-12-28 ENCOUNTER — Encounter (HOSPITAL_COMMUNITY): Payer: Self-pay | Admitting: Internal Medicine

## 2020-12-28 DIAGNOSIS — K7031 Alcoholic cirrhosis of liver with ascites: Principal | ICD-10-CM

## 2020-12-28 DIAGNOSIS — K652 Spontaneous bacterial peritonitis: Secondary | ICD-10-CM

## 2020-12-28 DIAGNOSIS — D689 Coagulation defect, unspecified: Secondary | ICD-10-CM

## 2020-12-28 LAB — CBC
HCT: 35.4 % — ABNORMAL LOW (ref 39.0–52.0)
Hemoglobin: 12.5 g/dL — ABNORMAL LOW (ref 13.0–17.0)
MCH: 35.5 pg — ABNORMAL HIGH (ref 26.0–34.0)
MCHC: 35.3 g/dL (ref 30.0–36.0)
MCV: 100.6 fL — ABNORMAL HIGH (ref 80.0–100.0)
Platelets: 85 10*3/uL — ABNORMAL LOW (ref 150–400)
RBC: 3.52 MIL/uL — ABNORMAL LOW (ref 4.22–5.81)
RDW: 14.3 % (ref 11.5–15.5)
WBC: 26.5 10*3/uL — ABNORMAL HIGH (ref 4.0–10.5)
nRBC: 0 % (ref 0.0–0.2)

## 2020-12-28 LAB — COMPREHENSIVE METABOLIC PANEL
ALT: 22 U/L (ref 0–44)
AST: 43 U/L — ABNORMAL HIGH (ref 15–41)
Albumin: 2.3 g/dL — ABNORMAL LOW (ref 3.5–5.0)
Alkaline Phosphatase: 135 U/L — ABNORMAL HIGH (ref 38–126)
Anion gap: 8 (ref 5–15)
BUN: 11 mg/dL (ref 6–20)
CO2: 26 mmol/L (ref 22–32)
Calcium: 8.7 mg/dL — ABNORMAL LOW (ref 8.9–10.3)
Chloride: 99 mmol/L (ref 98–111)
Creatinine, Ser: 1.06 mg/dL (ref 0.61–1.24)
GFR, Estimated: >60 mL/min (ref >60)
Glucose, Bld: 139 mg/dL — ABNORMAL HIGH (ref 70–99)
Potassium: 3.9 mmol/L (ref 3.5–5.1)
Sodium: 133 mmol/L — ABNORMAL LOW (ref 135–145)
Total Bilirubin: 13.5 mg/dL — ABNORMAL HIGH (ref 0.3–1.2)
Total Protein: 5.5 g/dL — ABNORMAL LOW (ref 6.5–8.1)

## 2020-12-28 LAB — GLUCOSE, CAPILLARY
Glucose-Capillary: 115 mg/dL — ABNORMAL HIGH (ref 70–99)
Glucose-Capillary: 130 mg/dL — ABNORMAL HIGH (ref 70–99)
Glucose-Capillary: 179 mg/dL — ABNORMAL HIGH (ref 70–99)
Glucose-Capillary: 158 mg/dL — ABNORMAL HIGH (ref 70–99)

## 2020-12-28 LAB — PROTIME-INR
INR: 2.3 — ABNORMAL HIGH (ref 0.8–1.2)
Prothrombin Time: 25.4 seconds — ABNORMAL HIGH (ref 11.4–15.2)

## 2020-12-28 MED ORDER — PHYTONADIONE 5 MG PO TABS
10.0000 mg | ORAL_TABLET | Freq: Every day | ORAL | Status: AC
  Administered 2020-12-28 – 2020-12-30 (×3): 10 mg via ORAL
  Filled 2020-12-28 (×3): qty 2

## 2020-12-28 MED ORDER — ALBUMIN HUMAN 25 % IV SOLN
12.5000 g | Freq: Once | INTRAVENOUS | Status: AC
  Administered 2020-12-28: 12.5 g via INTRAVENOUS
  Filled 2020-12-28: qty 50

## 2020-12-28 MED ORDER — HYDROMORPHONE HCL 2 MG PO TABS
4.0000 mg | ORAL_TABLET | Freq: Every day | ORAL | Status: DC | PRN
  Administered 2020-12-28 – 2021-01-01 (×16): 4 mg via ORAL
  Filled 2020-12-28 (×16): qty 2

## 2020-12-28 MED ORDER — CARVEDILOL 6.25 MG PO TABS
6.2500 mg | ORAL_TABLET | Freq: Two times a day (BID) | ORAL | Status: DC
  Administered 2020-12-28 – 2021-01-01 (×8): 6.25 mg via ORAL
  Filled 2020-12-28 (×8): qty 1

## 2020-12-28 MED ORDER — SODIUM CHLORIDE 0.9 % IV SOLN
2.0000 g | Freq: Three times a day (TID) | INTRAVENOUS | Status: DC
  Administered 2020-12-28 – 2020-12-30 (×6): 2 g via INTRAVENOUS
  Filled 2020-12-28 (×8): qty 2

## 2020-12-28 MED ORDER — ALBUMIN HUMAN 25 % IV SOLN
12.5000 g | Freq: Once | INTRAVENOUS | Status: AC
  Administered 2020-12-30: 12.5 g via INTRAVENOUS
  Filled 2020-12-28: qty 50

## 2020-12-28 MED ORDER — POLYETHYLENE GLYCOL 3350 17 G PO PACK
17.0000 g | PACK | Freq: Every day | ORAL | Status: DC
  Filled 2020-12-28 (×4): qty 1

## 2020-12-28 NOTE — Progress Notes (Signed)
Microbiology called,peritoneal fluid gram negative rods.

## 2020-12-28 NOTE — Progress Notes (Signed)
Pharmacy Antibiotic Note  Jared Bass is a 50 y.o. male admitted on 12/27/2020 with  SBP .  Pharmacy has been consulted for Cefepime dosing.  Plan: Start Cefepime 2 gms IV q8hr Monitor renal function, clinical status and C&S  Height: 5\' 8"  (172.7 cm) Weight: 90.8 kg (200 lb 1.6 oz) IBW/kg (Calculated) : 68.4  Temp (24hrs), Avg:99.9 F (37.7 C), Min:99.6 F (37.6 C), Max:100.2 F (37.9 C)  Recent Labs  Lab 12/27/20 0810 12/28/20 0349  WBC 3.4* 26.5*  CREATININE 0.89 1.06    Estimated Creatinine Clearance: 89.2 mL/min (by C-G formula based on SCr of 1.06 mg/dL).    Allergies  Allergen Reactions   Tramadol Anaphylaxis and Rash   Other Other (See Comments)    Rembrant Whitening strips  "fat lips"    Antimicrobials this admission: Ceftriaxone 6/28 >> 6/29 Cefepime 6/29 >>   Microbiology results: 6/28 Ascites fluid Cx: GNR   Thank you for allowing pharmacy to be a part of this patient's care.  7/28, PharmD, [facility] [place] Clinical Pharmacist Please see AMION for all Pharmacists' Contact Phone Numbers 12/28/2020, 10:08 AM

## 2020-12-28 NOTE — Progress Notes (Signed)
PROGRESS NOTE    Jared Bass  NWG:[id number] DOB: 1970-11-21 DOA: 12/27/2020 PCP: Armstead Peaks., MD   Brief Narrative: 50 year old with past medical history significant for alcoholic cirrhosis, prior alcohol use quit drinking alcohol after hospitalization in June 2021, SBP, esophageal varices, A. fib not on anticoagulation, prior SBP presented to the ED complaining of abdominal pain, worsening abdominal distension,  nausea.  Patient underwent paracentesis by IR yielding 1.1 L on 6/28 the day of admission.  CT abdomen and pelvis showed findings consistent with hepatic cirrhosis, decreased diffuse colonic wall thickening extending from the ascending colon to the proximal sigmoid which may reflect portal colopathy or less likely infectious or inflammatory colitis.  Patient admitted with SBP and decompensated cirrhosis.  Assessment & Plan:   Principal Problem:   SBP (spontaneous bacterial peritonitis) (HCC) Active Problems:   Thrombocytopenia (HCC)   Coagulopathy (HCC)   Unspecified atrial fibrillation (HCC)   Leukopenia   1-Decompensated Alcoholic Liver Cirrhosis complicated by SBP;  -Underwent paracentesis yielding 1.1 L dark yellow fluid.  Ascites fluid showing white blood cell of 5975, neutrophil count 89%, cultures growing gram-negative rods -Antibiotics changed to cefepime.  -Monitor leukocytosis -GI consulted  2-Alcohol use disorder in remission:  Paroxysmal A. fib: Resume carvedilol Not on anticoagulation due to chronic thrombocytopenia and coagulopathy  Hyponatremia; related to cirrhosis. Monitor.  Chronic pain; resume home dose dilaudid.      Estimated body mass index is 30.43 kg/m as calculated from the following:   Height as of this encounter: 5\' 8"  (1.727 m).   Weight as of this encounter: 90.8 kg.   DVT prophylaxis: SCD Code Status: Full code Family Communication: care discussed with patient/ Disposition Plan:  Status is: Inpatient  Remains  inpatient appropriate because:IV treatments appropriate due to intensity of illness or inability to take PO  Dispo: The patient is from: Home              Anticipated d/c is to: Home              Patient currently is not medically stable to d/c.   Difficult to place patient No        Consultants:  GI  Procedures:  Paracentesis 6/28  Antimicrobials:    Subjective: He is complaining of abdominal pain needs pain medication. He usually have 3 BM per day takes miralax as need.   Objective: Vitals:   12/28/20 0022 12/28/20 0524 12/28/20 1138 12/28/20 1212  BP: 125/76 111/70 125/79 117/73  Pulse: (!) 108 (!) 107 98 100  Resp: 18 18  16   Temp: 100.2 F (37.9 C) 99.6 F (37.6 C)  98.9 F (37.2 C)  TempSrc: Oral Oral  Oral  SpO2: 96% 94%  97%  Weight:      Height:        Intake/Output Summary (Last 24 hours) at 12/28/2020 1507 Last data filed at 12/28/2020 0126 Gross per 24 hour  Intake 960 ml  Output --  Net 960 ml   Filed Weights   12/27/20 0802 12/27/20 2208  Weight: 86.2 kg 90.8 kg    Examination:  General exam: Appears calm and comfortable  Respiratory system: Clear to auscultation. Respiratory effort normal. Cardiovascular system: S1 & S2 heard, RRR. No JVD, murmurs, rubs, gallops or clicks. No pedal edema. Gastrointestinal system: Abdomen is nondistended, soft and nontender. No organomegaly or masses felt. Normal bowel sounds heard. Central nervous system: Alert and oriented.  Extremities: Symmetric 5 x 5 power.    Data Reviewed:  I have personally reviewed following labs and imaging studies  CBC: Recent Labs  Lab 12/27/20 0810 12/28/20 0349  WBC 3.4* 26.5*  NEUTROABS 1.4*  --   HGB 13.1 12.5*  HCT 37.8* 35.4*  MCV 102.4* 100.6*  PLT 95* 85*   Basic Metabolic Panel: Recent Labs  Lab 12/27/20 0810 12/28/20 0349  NA 137 133*  K 3.5 3.9  CL 98 99  CO2 31 26  GLUCOSE 135* 139*  BUN 5* 11  CREATININE 0.89 1.06  CALCIUM 8.8* 8.7*    GFR: Estimated Creatinine Clearance: 89.2 mL/min (by C-G formula based on SCr of 1.06 mg/dL). Liver Function Tests: Recent Labs  Lab 12/27/20 0810 12/28/20 0349  AST 51* 43*  ALT 25 22  ALKPHOS 141* 135*  BILITOT 10.8* 13.5*  PROT 5.8* 5.5*  ALBUMIN 2.6* 2.3*   Recent Labs  Lab 12/27/20 0810  LIPASE 36   No results for input(s): AMMONIA in the last 168 hours. Coagulation Profile: Recent Labs  Lab 12/27/20 0810 12/28/20 0349  INR 2.1* 2.3*   Cardiac Enzymes: No results for input(s): CKTOTAL, CKMB, CKMBINDEX, TROPONINI in the last 168 hours. BNP (last 3 results) No results for input(s): PROBNP in the last 8760 hours. HbA1C: No results for input(s): HGBA1C in the last 72 hours. CBG: Recent Labs  Lab 12/27/20 2208 12/28/20 0612 12/28/20 1107  GLUCAP 134* 115* 130*   Lipid Profile: No results for input(s): CHOL, HDL, LDLCALC, TRIG, CHOLHDL, LDLDIRECT in the last 72 hours. Thyroid Function Tests: No results for input(s): TSH, T4TOTAL, FREET4, T3FREE, THYROIDAB in the last 72 hours. Anemia Panel: No results for input(s): VITAMINB12, FOLATE, FERRITIN, TIBC, IRON, RETICCTPCT in the last 72 hours. Sepsis Labs: No results for input(s): PROCALCITON, LATICACIDVEN in the last 168 hours.  Recent Results (from the past 240 hour(s))  Resp Panel by RT-PCR (Flu A&B, Covid) Nasopharyngeal Swab     Status: None   Collection Time: 12/27/20  8:15 AM   Specimen: Nasopharyngeal Swab; Nasopharyngeal(NP) swabs in vial transport medium  Result Value Ref Range Status   SARS Coronavirus 2 by RT PCR NEGATIVE NEGATIVE Final    Comment: (NOTE) SARS-CoV-2 target nucleic acids are NOT DETECTED.  The SARS-CoV-2 RNA is generally detectable in upper respiratory specimens during the acute phase of infection. The lowest concentration of SARS-CoV-2 viral copies this assay can detect is 138 copies/mL. A negative result does not preclude SARS-Cov-2 infection and should not be used as the sole  basis for treatment or other patient management decisions. A negative result may occur with  improper specimen collection/handling, submission of specimen other than nasopharyngeal swab, presence of viral mutation(s) within the areas targeted by this assay, and inadequate number of viral copies(<138 copies/mL). A negative result must be combined with clinical observations, patient history, and epidemiological information. The expected result is Negative.  Fact Sheet for Patients:  BloggerCourse.com  Fact Sheet for Healthcare Providers:  SeriousBroker.it  This test is no t yet approved or cleared by the [place] FDA and  has been authorized for detection and/or diagnosis of SARS-CoV-2 by FDA under an Emergency Use Authorization (EUA). This EUA will remain  in effect (meaning this test can be used) for the duration of the COVID-19 declaration under Section 564(b)(1) of the Act, 21 U.S.C.section 360bbb-3(b)(1), unless the authorization is terminated  or revoked sooner.       Influenza A by PCR NEGATIVE NEGATIVE Final   Influenza B by PCR NEGATIVE NEGATIVE Final    Comment: (NOTE)  The Xpert Xpress SARS-CoV-2/FLU/RSV plus assay is intended as an aid in the diagnosis of influenza from Nasopharyngeal swab specimens and should not be used as a sole basis for treatment. Nasal washings and aspirates are unacceptable for Xpert Xpress SARS-CoV-2/FLU/RSV testing.  Fact Sheet for Patients: BloggerCourse.com  Fact Sheet for Healthcare Providers: SeriousBroker.it  This test is not yet approved or cleared by the [place] FDA and has been authorized for detection and/or diagnosis of SARS-CoV-2 by FDA under an Emergency Use Authorization (EUA). This EUA will remain in effect (meaning this test can be used) for the duration of the COVID-19 declaration under Section 564(b)(1) of the Act,  21 U.S.C. section 360bbb-3(b)(1), unless the authorization is terminated or revoked.  Performed at [facility], [address] [place], [place] [address]   Body fluid culture w Gram Stain     Status: None (Preliminary result)   Collection Time: 12/27/20 11:41 AM   Specimen: Abdomen; Peritoneal Fluid  Result Value Ref Range Status   Specimen Description PERITONEAL FLUID  Final   Special Requests ABDOMEN  Final   Gram Stain   Final    MODERATE WBC PRESENT, PREDOMINANTLY PMN NO ORGANISMS SEEN    Culture   Final    FEW GRAM NEGATIVE RODS CULTURE REINCUBATED FOR BETTER GROWTH CRITICAL RESULT CALLED TO, READ BACK BY AND VERIFIED WITH: RN Dierdre Forth [id number] AT 944 AM BY CM Performed at [facility], [address] [place], [place] [address]    Report Status PENDING  Incomplete         Radiology Studies: CT ABDOMEN PELVIS W CONTRAST  Result Date: 12/27/2020 CLINICAL DATA:  Left upper quadrant abdominal pain. EXAM: CT ABDOMEN AND PELVIS WITH CONTRAST TECHNIQUE: Multidetector CT imaging of the abdomen and pelvis was performed using the standard protocol following bolus administration of intravenous contrast. CONTRAST:  OMNIPAQUE IOHEXOL 300 MG/ML  SOLN COMPARISON:  August 08, 2020 FINDINGS: Lower chest: Hypoventilatory change in the dependent lungs. Hepatobiliary: Cirrhotic hepatic morphology. Stable scattered tiny hypodensities in the liver, likely cysts. Gallbladder is dilated similar to prior examination measuring 6.5 cm in maximum diameter. There is mild intra and extrahepatic biliary ductal dilation with the common duct measuring up to 11-12 mm, similar multiple priors and and previously evaluated with MRI abdomen on January 27, 2020 sequela of patient's dilated gallbladder. Pancreas: No pancreatic ductal dilation. No evidence of acute pancreatic inflammation. Spleen: No splenomegaly.  Benign splenic cleft. Adrenals/Urinary Tract: Adrenal glands are unremarkable. Kidneys  are normal, without renal calculi, solid enhancing lesion, or hydronephrosis. Bladder is unremarkable for degree of distension. Stomach/Bowel: Stomach is grossly unremarkable. No pathologic dilation of small bowel. The appendix appears unremarkable. Mild diffuse colonic wall thickening extending from the ascending colon to the proximal sigmoid colon. Vascular/Lymphatic: No abdominal aortic aneurysm. Recannulized periumbilical vein. Gastric, esophageal, and perisplenic varices. The portal, splenic and superior mesenteric veins are patent. Prominent upper abdominal lymph nodes, similar to prior, not pathologically enlarged by size criteria and favored reactive. No pathologically enlarged abdominal or pelvic lymph nodes. Reproductive: Prostate is unremarkable. Other: Small volume abdominopelvic ascites. No pneumoperitoneum. No portal venous gas. No pneumatosis. Anasarca. Musculoskeletal: No acute or significant osseous abnormality. Right gynecomastia partially visualized. IMPRESSION: 1. Cirrhotic hepatic morphology with evidence of portal hypertension including abdominal collateralized vasculature, small volume abdominopelvic ascites and anasarca. 2. Slightly decreased diffuse colonic wall thickening extending from the ascending colon to the proximal sigmoid colon, which may reflect portal colopathy or less likely other  infectious or inflammatory colitis. 3. Dilated gallbladder and mild intra and extrahepatic biliary ductal dilation, similar to prior examination and likely sequela of patient's hepatocellular disease. However acalculous cholecystitis not excluded, consider further evaluation with nuclear medicine HIDA scan. Electronically Signed   By: Maudry Mayhew MD   On: 12/27/2020 14:42   DG Chest Port 1 View  Result Date: 12/27/2020 CLINICAL DATA:  Shortness of breath. EXAM: PORTABLE CHEST 1 VIEW COMPARISON:  Chest x-ray 01/27/2020. FINDINGS: Mediastinum and hilar structures normal. Cardiomegaly. No pulmonary  venous congestion. Low lung volumes with mild bibasilar atelectasis. No focal infiltrate. No pleural effusion or pneumothorax. Prior cervical spine fusion. IMPRESSION: 1.  Cardiomegaly.  No pulmonary venous congestion. 2.  Low lung volumes with mild bibasilar atelectasis. Electronically Signed   By: Maisie Fus  Register   On: 12/27/2020 08:41   IR Paracentesis  Result Date: 12/27/2020 INDICATION: Patient with a history of alcoholic cirrhosis with ascites presents today for a diagnostic and therapeutic paracentesis. EXAM: ULTRASOUND GUIDED PARACENTESIS MEDICATIONS: 1% lidocaine 10 mL COMPLICATIONS: None immediate. PROCEDURE: Informed written consent was obtained from the patient after a discussion of the risks, benefits and alternatives to treatment. A timeout was performed prior to the initiation of the procedure. Initial ultrasound scanning demonstrates a large amount of ascites within the right lower abdominal quadrant. The right lower abdomen was prepped and draped in the usual sterile fashion. 1% lidocaine was used for local anesthesia. Following this, a 6 Fr Safe-T-Centesis catheter was introduced. An ultrasound image was saved for documentation purposes. The paracentesis was performed. The catheter was removed and a dressing was applied. The patient tolerated the procedure well without immediate post procedural complication. FINDINGS: A total of approximately 1.1 L of dark yellow fluid was removed. Samples were sent to the laboratory as requested by the clinical team. IMPRESSION: Successful ultrasound-guided paracentesis yielding 1.1 liters of peritoneal fluid. Read by: Alwyn Ren, NP Electronically Signed   By: Marliss Coots MD   On: 12/27/2020 11:50        Scheduled Meds:  phytonadione  10 mg Oral Daily   Continuous Infusions:  ceFEPime (MAXIPIME) IV 2 g (12/28/20 1145)     LOS: 1 day    Time spent 35 minutes.     Alba Cory, MD [facility]   If 7PM-7AM, please  contact night-coverage [email or web address]  12/28/2020, 3:07 PM

## 2020-12-28 NOTE — Consult Note (Addendum)
[facility] Consult: 9:19 AM 12/28/2020  LOS: 1 day    Referring Provider: Dr Frederic Jericho  Primary Care Physician:  Dustin Folks MD at [facility].   Primary Gastroenterologist:  unassigned.   Has not seen GI provider in office.      Reason for Consultation:  abdominal pain.  SBP   HPI: Jared Bass is a 50 y.o. male.  PMH outlined below.  History of alcohol induced cirrhosis of the liver.  ETOH hepatitis, treated w Prednisolone in 01/2020.  Abstinent since 12/2019.  Nonbleeding esophageal varices.  Portal hypertensive gastropathy.  SBP.  A. Fib and hx saddle PE, not on AC (Eliquis d/c'd in 12/2019)  Thrombocytopenia.  Coagulopathy.  Chronic Dilaudid for cervical spine disease/pain.  12/2019 EGD.  Variceal screening, initial study.  Grade 1 and 2 distal esophageal varices.  Portal hypertensive gastropathy.  Antral erythema/gastritis.  Otherwise normal study to D2.  PV Dopplers normal. Serum H Pylori positive at 2.0 in 12/2019, Rx w Bismuth, Flagyl, Doxycycline, bid PPI.   Plan was to check stool H Pylori after another 6 weeks of high dose Protonix 40 bid.  Has not had this performed no longer taking PPI, H2 blocker etc. 12/2019 abdominal ultrasound.  GB dilated.  No GB stones.  CBD 9.4 mm.  Cirrhosis.  Moderate ascites. 12/2019 MRI/MRCP with cirrhosis, changes of portal venous hypertension, prominent esophageal varices.  Nonspecific GB distention and wall thickening.  CBD 9 mm, mild/diffuse biliary ductal dilatation.  Diffuse SB wall thickening and mesenteric edema likely due to hypoalbuminemia.  Moderate to large ascites. 12/2019 paracentesis.  1.4 L removed.  No SBP.   08/08/2020 paracentesis.  2.6 L removed.  Fluid studies positive for SBP with 16 K nucleated cells. Its not clear that he ever filled the prescription for  Cipro prescribed for SBP but certainly is not taking that now. .      Home meds include Dilaudid prn, spironolactone 50 mg/daily.  No PPI or H2 blocker.  Had been prescribed ciprofloxacin 500 mg/daily after diagnosis of SBP in February 2022 but no longer taking this medication.  Also during that admission PPI (Protonix) was discontinued.  Seen by triage in ED on 619 and had labs for complaint of nominal pain and patient sense that he needed repeat paracentesis.  However he left ED before being seen by medical provider.  WBCs normal.  T bili 9.2.  Alk phos 173.  AST/ALT 53/31.  Pain ultimately resolved and did not recur until early yesterday morning. Returned via EMS at 8 AM yesterday after developing acute abdominal pain and nausea w/o vomiting overnight.  Endorses fevers.  His roommate told him he looked jaundiced.  Urine is dark yellow.  Since starting antibiotics stools are loose but normally these are formed and occur daily.  T bili 10.8 >> 13.5 Alkaline phosphatase 141 >> 35. AST/ALT 51/25 >> 43/22. Lipase 36. WBCs 26.5.  Hgb 12.5, MCV 102.  INR 2.3. Glucose 130s CXR with cardiomegaly.  No signs of heart failure. CTAP w contrast: Cirrhosis.  Abdominal vascular collateralization.  Small volume ascites and anasarca.  Diffuse colonic wall thickening from the ascending to proximal sigmoid, ?  Portal colopathy or less likely infectious/inflammatory colitis.  GB dilated.  Mild intra/extrahepatic biliary ductal dilatation similar to previous and likely due to hepatocellular disease, acalculus cholecystitis not excluded.  Consider HIDA.  Pancreas and PD normal.   12/27/2020 paracentesis.  1.1 L removed.  5,975 nucleated cells.  Mostly PMNs and a few GNR's seen on Gram stain. Received Rocephin yesterday and started Maxipime today.  Abstinent of alcohol since the summer 2021 after diagnosis with cirrhosis.    Past Medical History:  Diagnosis Date   Acute renal failure (HCC)    Acute respiratory  failure ([place])    2017   Acute respiratory failure with hypoxia (HCC)    Acute saddle pulmonary embolism with acute cor pulmonale (HCC)    Chronic neck pain    Closed fracture of nasal bone    DDD (degenerative disc disease), cervical 05/04/2015   Faintness    Hemoptysis    History of atrial fibrillation    History of head injury 05/04/2015   History of tachycardia 05/04/2015   Hypertension    Hypotension    Pulmonary emboli (HCC)    on elequis   Pulmonary embolism (HCC)    Right eye injury    Saddle pulmonary embolus ([place]) 12/13/2015   Syncope and collapse    Tachycardia 12/20/2015    Past Surgical History:  Procedure Laterality Date   ESOPHAGOGASTRODUODENOSCOPY (EGD) WITH PROPOFOL N/A 01/29/2020   Procedure: ESOPHAGOGASTRODUODENOSCOPY (EGD) WITH PROPOFOL;  Surgeon: Lavena Bullion, DO;  Location: [place];  Service: Gastroenterology;  Laterality: N/A;   IR PARACENTESIS  01/29/2020   IR PARACENTESIS  12/27/2020   neck fusion     c5-7   TONSILLECTOMY      Prior to Admission medications   Medication Sig Start Date End Date Taking? Authorizing Provider  albuterol (VENTOLIN HFA) 108 (90 Base) MCG/ACT inhaler INHALE 2 PUFFS INTO THE LUNGS EVERY 6 HOURS AS NEEDED (COUGH). Patient taking differently: Inhale 2 puffs into the lungs every 6 (six) hours as needed for wheezing or shortness of breath. 05/04/19  Yes Sid Falcon, MD  carvedilol (COREG) 6.25 MG tablet TAKE 1 TABLET BY MOUTH 2 TIMES DAILY WITH A MEAL. Patient taking differently: Take 6.25 mg by mouth daily. 06/27/20  Yes Sid Falcon, MD  furosemide (LASIX) 40 MG tablet Take 40 mg by mouth daily.   Yes [provider]  HYDROmorphone (DILAUDID) 4 MG tablet Take 4 mg by mouth 5 (five) times daily as needed for moderate pain or severe pain. 12/05/20  Yes [provider]  Multiple Vitamins-Minerals (MULTI + OMEGA-3 ADULT GUMMIES PO) Take 1 tablet by mouth daily.   Yes [provider]  polyethylene  glycol (MIRALAX / GLYCOLAX) packet Take 17 g by mouth daily. Patient taking differently: Take 17 g by mouth daily as needed for mild constipation. 07/30/17  Yes Lorella Nimrod, MD  promethazine (PHENERGAN) 12.5 MG tablet Take 12.5 mg by mouth every 6 (six) hours as needed for nausea or vomiting. 11/17/20  Yes [provider]  spironolactone (ALDACTONE) 50 MG tablet TAKE 1 TABLET BY MOUTH EVERY DAY Patient taking differently: Take 50 mg by mouth daily. 06/27/20  Yes Sid Falcon, MD  Vitamin D, Ergocalciferol, (DRISDOL) 1.25 MG (50000 UNIT) CAPS capsule Take 50,000 Units by mouth every Monday. 08/11/20  Yes [provider]  furosemide (LASIX) 20 MG tablet TAKE 1 TABLET  BY MOUTH EVERY DAY Patient not taking: Reported on 12/27/2020 06/27/20   Sid Falcon, MD  gabapentin (NEURONTIN) 300 MG capsule Take 300 mg by mouth 3 (three) times daily. Uses PRN Patient not taking: Reported on 12/27/2020 06/03/20   [provider]    Scheduled Meds:  Infusions:  cefTRIAXone (ROCEPHIN)  IV     PRN Meds: HYDROmorphone (DILAUDID) injection, prochlorperazine   Allergies as of 12/27/2020 - Review Complete 12/27/2020  Allergen Reaction Noted   Tramadol Anaphylaxis and Rash 12/29/2007   Other Other (See Comments) 07/09/2012    Family History  Problem Relation Age of Onset   Hypertension Mother    COPD Mother    Mesothelioma Father    COPD Father    Peripheral vascular disease Brother        Clots in his arteries surgically removed    Social History   Socioeconomic History   Marital status: Single    Spouse name: Not on file   Number of children: Not on file   Years of education: Not on file   Highest education level: Not on file  Occupational History   Not on file  Tobacco Use   Smoking status: Never   Smokeless tobacco: Never  Substance and Sexual Activity   Alcohol use: No    Alcohol/week: 0.0 standard drinks   Drug use: No   Sexual activity: Not on file   Other Topics Concern   Not on file  Social History Narrative   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   Social Determinants of Health   Financial Resource Strain: Not on file  Food Insecurity: Not on file  Transportation Needs: Not on file  Physical Activity: Not on file  Stress: Not on file  Social Connections: Not on file  Intimate Partner Violence: Not on file    REVIEW OF SYSTEMS: Constitutional: No profound fatigue but malaise occurring with acute pain as of yesterday. ENT:  No nose bleeds Pulm: Frothy, clear cough.  No dyspnea. CV:  No palpitations. No angina.  Occasional lower extremity edema which improves if he fluid and salt restrict. GU:  No hematuria, no frequency GI: See HPI. Heme: Denies unusual or excessive bleeding or bruising. Transfusions: No previous blood product transfusions. Neuro:  No headaches, no peripheral tingling or numbness.  No syncope, no seizures. Derm:  No itching, no rash or sores.  Endocrine:  No sweats or chills.  No polyuria or dysuria Immunization: Not queried. Travel:  None beyond local counties in last few months.    PHYSICAL EXAM: Vital signs in last 24 hours: Vitals:   12/28/20 0022 12/28/20 0524  BP: 125/76 111/70  Pulse: (!) 108 (!) 107  Resp: 18 18  Temp: 100.2 F (37.9 C) 99.6 F (37.6 C)  SpO2: 96% 94%   Wt Readings from Last 3 Encounters:  12/27/20 90.8 kg  12/18/20 86.2 kg  10/25/20 86.2 kg    General: Well-appearing, alert, does not look uncomfortable. Head: No facial asymmetry or swelling.  No signs of head trauma. Eyes: Icteric sclera.  Conjunctiva pink.  EOMI Ears: No obvious hearing deficit Nose: No congestion or discharge Mouth: Tongue midline.  Mucosa moist, pink, clear.  Nonspecific, mild white coating in the middle of his tongue.  Missing teeth but not edentulous. Neck: No JVD, no masses, no thyromegaly Lungs: Clear bilaterally without labored breathing or cough Heart: RRR.   No MRG.  S1, S2 present. Abdomen: Soft without distention.  Active bowel  sounds.  Tender across the upper abdomen/epigastrium bilaterally.  No HSM, masses, bruits, hernia.   Rectal: Deferred. Musc/Skeltl: Joint redness, swelling or gross deformity. Extremities: No CCE. Neurologic: Oriented x3.  No limb weakness.  No tremor.  No asterixis.  Appropriate. Skin: Jaundice noted but somewhat masked by suntan. Tattoos: Professional quality on the arm. Nodes: No cervical adenopathy Psych: Cooperative, calm, pleasant, fluid speech, appropriate  Intake/Output from previous day: 06/28 0701 - 06/29 0700 In: 960 [P.O.:960] Out: -  Intake/Output this shift: No intake/output data recorded.  LAB RESULTS: Recent Labs    12/27/20 0810 12/28/20 0349  WBC 3.4* 26.5*  HGB 13.1 12.5*  HCT 37.8* 35.4*  PLT 95* 85*   BMET Lab Results  Component Value Date   NA 133 (L) 12/28/2020   NA 137 12/27/2020   NA 141 12/18/2020   K 3.9 12/28/2020   K 3.5 12/27/2020   K 4.9 12/18/2020   CL 99 12/28/2020   CL 98 12/27/2020   CL 107 12/18/2020   CO2 26 12/28/2020   CO2 31 12/27/2020   CO2 29 12/18/2020   GLUCOSE 139 (H) 12/28/2020   GLUCOSE 135 (H) 12/27/2020   GLUCOSE 124 (H) 12/18/2020   BUN 11 12/28/2020   BUN 5 (L) 12/27/2020   BUN 8 12/18/2020   CREATININE 1.06 12/28/2020   CREATININE 0.89 12/27/2020   CREATININE 0.96 12/18/2020   CALCIUM 8.7 (L) 12/28/2020   CALCIUM 8.8 (L) 12/27/2020   CALCIUM 9.0 12/18/2020   LFT Recent Labs    12/27/20 0810 12/28/20 0349  PROT 5.8* 5.5*  ALBUMIN 2.6* 2.3*  AST 51* 43*  ALT 25 22  ALKPHOS 141* 135*  BILITOT 10.8* 13.5*   PT/INR Lab Results  Component Value Date   INR 2.3 (H) 12/28/2020   INR 2.1 (H) 12/27/2020   INR 2.1 (H) 08/08/2020   Hepatitis Panel No results for input(s): HEPBSAG, HCVAB, HEPAIGM, HEPBIGM in the last 72 hours. C-Diff No components found for: CDIFF Lipase     Component Value Date/Time   LIPASE 36 12/27/2020  0810    Drugs of Abuse     Component Value Date/Time   LABOPIA NONE DETECTED 04/05/2019 0931   COCAINSCRNUR NONE DETECTED 04/05/2019 0931   LABBENZ POSITIVE (A) 04/05/2019 0931   AMPHETMU NONE DETECTED 04/05/2019 0931   THCU NONE DETECTED 04/05/2019 0931   LABBARB NONE DETECTED 04/05/2019 0931     RADIOLOGY STUDIES: CT ABDOMEN PELVIS W CONTRAST  Result Date: 12/27/2020 CLINICAL DATA:  Left upper quadrant abdominal pain. EXAM: CT ABDOMEN AND PELVIS WITH CONTRAST TECHNIQUE: Multidetector CT imaging of the abdomen and pelvis was performed using the standard protocol following bolus administration of intravenous contrast. CONTRAST:  171m OMNIPAQUE IOHEXOL 300 MG/ML  SOLN COMPARISON:  August 08, 2020 FINDINGS: Lower chest: Hypoventilatory change in the dependent lungs. Hepatobiliary: Cirrhotic hepatic morphology. Stable scattered tiny hypodensities in the liver, likely cysts. Gallbladder is dilated similar to prior examination measuring 6.5 cm in maximum diameter. There is mild intra and extrahepatic biliary ductal dilation with the common duct measuring up to 11-12 mm, similar multiple priors and and previously evaluated with MRI abdomen on January 27, 2020 sequela of patient's dilated gallbladder. Pancreas: No pancreatic ductal dilation. No evidence of acute pancreatic inflammation. Spleen: No splenomegaly.  Benign splenic cleft. Adrenals/Urinary Tract: Adrenal glands are unremarkable. Kidneys are normal, without renal calculi, solid enhancing lesion, or hydronephrosis. Bladder is unremarkable for degree of distension. Stomach/Bowel: Stomach is grossly unremarkable. No pathologic dilation of small  bowel. The appendix appears unremarkable. Mild diffuse colonic wall thickening extending from the ascending colon to the proximal sigmoid colon. Vascular/Lymphatic: No abdominal aortic aneurysm. Recannulized periumbilical vein. Gastric, esophageal, and perisplenic varices. The portal, splenic and superior  mesenteric veins are patent. Prominent upper abdominal lymph nodes, similar to prior, not pathologically enlarged by size criteria and favored reactive. No pathologically enlarged abdominal or pelvic lymph nodes. Reproductive: Prostate is unremarkable. Other: Small volume abdominopelvic ascites. No pneumoperitoneum. No portal venous gas. No pneumatosis. Anasarca. Musculoskeletal: No acute or significant osseous abnormality. Right gynecomastia partially visualized. IMPRESSION: 1. Cirrhotic hepatic morphology with evidence of portal hypertension including abdominal collateralized vasculature, small volume abdominopelvic ascites and anasarca. 2. Slightly decreased diffuse colonic wall thickening extending from the ascending colon to the proximal sigmoid colon, which may reflect portal colopathy or less likely other infectious or inflammatory colitis. 3. Dilated gallbladder and mild intra and extrahepatic biliary ductal dilation, similar to prior examination and likely sequela of patient's hepatocellular disease. However acalculous cholecystitis not excluded, consider further evaluation with nuclear medicine HIDA scan. Electronically Signed   By: Dahlia Bailiff MD   On: 12/27/2020 14:42   DG Chest Port 1 View  Result Date: 12/27/2020 CLINICAL DATA:  Shortness of breath. EXAM: PORTABLE CHEST 1 VIEW COMPARISON:  Chest x-ray 01/27/2020. FINDINGS: Mediastinum and hilar structures normal. Cardiomegaly. No pulmonary venous congestion. Low lung volumes with mild bibasilar atelectasis. No focal infiltrate. No pleural effusion or pneumothorax. Prior cervical spine fusion. IMPRESSION: 1.  Cardiomegaly.  No pulmonary venous congestion. 2.  Low lung volumes with mild bibasilar atelectasis. Electronically Signed   By: Marcello Moores  Register   On: 12/27/2020 08:41   IR Paracentesis  Result Date: 12/27/2020 INDICATION: Patient with a history of alcoholic cirrhosis with ascites presents today for a diagnostic and therapeutic  paracentesis. EXAM: ULTRASOUND GUIDED PARACENTESIS MEDICATIONS: 1% lidocaine 10 mL COMPLICATIONS: None immediate. PROCEDURE: Informed written consent was obtained from the patient after a discussion of the risks, benefits and alternatives to treatment. A timeout was performed prior to the initiation of the procedure. Initial ultrasound scanning demonstrates a large amount of ascites within the right lower abdominal quadrant. The right lower abdomen was prepped and draped in the usual sterile fashion. 1% lidocaine was used for local anesthesia. Following this, a 6 Fr Safe-T-Centesis catheter was introduced. An ultrasound image was saved for documentation purposes. The paracentesis was performed. The catheter was removed and a dressing was applied. The patient tolerated the procedure well without immediate post procedural complication. FINDINGS: A total of approximately 1.1 L of dark yellow fluid was removed. Samples were sent to the laboratory as requested by the clinical team. IMPRESSION: Successful ultrasound-guided paracentesis yielding 1.1 liters of peritoneal fluid. Read by: Soyla Dryer, NP Electronically Signed   By: Ruthann Cancer MD   On: 12/27/2020 11:50      IMPRESSION:     Abdominal pain.  Ascites.  SBP 08/2020, not taking SBP prophylaxis PTA.  SBP on tap.   Maxipime day 1, Rocephin x1 yesterday.   CT scan showing colonic wall thickening,? Portal colopathy, less likely factious or inflammatory colitis.  Persistent mild intra-/extrahepatic biliary ductal dilatation is nonspecific. Serum H Pylori Ab + in 12/2019, treated w Doxy, flagyl, bismuth, PPI.  No longer on PPI.    Cirrhosis of the liver due to alcohol.  Patient reported abstinence since 12/2019.      Thrombocytopenia.  Noncritical.      Hyponatremia at 133.  Normal renal function.  Coagulopathy.  INR 2.3.     Chronic Dilaudid for C spine disc dz.  S/p 2 C spine surgeries.       Hyperglycemia.  No history of DM 2 has had blood  sugars into the low 100s on past admissions.    PLAN:        Asked hospitalist to resume his chronic home dosing of oral Dilaudid.  If this does not control the abdominal pain, right something besides IV Dilaudid for pain management as patient says the IV Dilaudid is not helping.  Give Maxipime additional time to see if this results in diminished abdominal pain.     ? HIDA?,  If pursued would need to hold narcotics for several hours before hand.      Diet as tolerated.  Heart healthy, 1.2 L fluid restricted diet currently ordered.       EGD prior to d/c to re-assess esoph varices.      CMET, CBC. PT/INR in AM.      PO Vit K    Stool H Pylori??    Azucena Freed  12/28/2020, 9:19 AM Phone [phone]    Attending physician's note   I have taken a history, examined the patient and reviewed the chart. I agree with the Advanced Practitioner's note, impression and recommendations.  50 year old male with alcohol induced cirrhosis decompensated by ascites, SBP admitted with abdominal pain and recurrent SBP  MELD score 26  Continue cefepime for 10-day course and will need to stay on antibiotics for long term for SBP secondary prophylaxis  IV albumin 100 g on day 1 and day 3 Hold diuretics for now, at risk for AKI in the setting of SBP  Small varices on last EGD in 12/2019, was recommended to start Nadolol 5m, can start it once acute issues resolve Will hold off repeat EGD given no evidence of active bleeding  GI will continue to follow along  The patient was provided an opportunity to ask questions and all were answered. The patient agreed with the plan and demonstrated an understanding of the instructions.  KDamaris Hippo, MD [phone]

## 2020-12-29 DIAGNOSIS — D696 Thrombocytopenia, unspecified: Secondary | ICD-10-CM

## 2020-12-29 LAB — PROTIME-INR
INR: 2.5 — ABNORMAL HIGH (ref 0.8–1.2)
Prothrombin Time: 26.6 seconds — ABNORMAL HIGH (ref 11.4–15.2)

## 2020-12-29 LAB — COMPREHENSIVE METABOLIC PANEL
ALT: 22 U/L (ref 0–44)
AST: 37 U/L (ref 15–41)
Albumin: 2.4 g/dL — ABNORMAL LOW (ref 3.5–5.0)
Alkaline Phosphatase: 115 U/L (ref 38–126)
Anion gap: 5 (ref 5–15)
BUN: 13 mg/dL (ref 6–20)
CO2: 28 mmol/L (ref 22–32)
Calcium: 8.2 mg/dL — ABNORMAL LOW (ref 8.9–10.3)
Chloride: 96 mmol/L — ABNORMAL LOW (ref 98–111)
Creatinine, Ser: 0.99 mg/dL (ref 0.61–1.24)
GFR, Estimated: >60 mL/min (ref >60)
Glucose, Bld: 142 mg/dL — ABNORMAL HIGH (ref 70–99)
Potassium: 3.4 mmol/L — ABNORMAL LOW (ref 3.5–5.1)
Sodium: 129 mmol/L — ABNORMAL LOW (ref 135–145)
Total Bilirubin: 12.1 mg/dL — ABNORMAL HIGH (ref 0.3–1.2)
Total Protein: 5.2 g/dL — ABNORMAL LOW (ref 6.5–8.1)

## 2020-12-29 LAB — CBC
HCT: 31.1 % — ABNORMAL LOW (ref 39.0–52.0)
Hemoglobin: 10.9 g/dL — ABNORMAL LOW (ref 13.0–17.0)
MCH: 35.2 pg — ABNORMAL HIGH (ref 26.0–34.0)
MCHC: 35 g/dL (ref 30.0–36.0)
MCV: 100.3 fL — ABNORMAL HIGH (ref 80.0–100.0)
Platelets: 61 10*3/uL — ABNORMAL LOW (ref 150–400)
RBC: 3.1 MIL/uL — ABNORMAL LOW (ref 4.22–5.81)
RDW: 13.8 % (ref 11.5–15.5)
WBC: 12.5 10*3/uL — ABNORMAL HIGH (ref 4.0–10.5)
nRBC: 0 % (ref 0.0–0.2)

## 2020-12-29 LAB — MAGNESIUM: Magnesium: 1.6 mg/dL — ABNORMAL LOW (ref 1.7–2.4)

## 2020-12-29 LAB — PATHOLOGIST SMEAR REVIEW

## 2020-12-29 LAB — GLUCOSE, CAPILLARY: Glucose-Capillary: 122 mg/dL — ABNORMAL HIGH (ref 70–99)

## 2020-12-29 MED ORDER — IBUPROFEN 200 MG PO TABS
200.0000 mg | ORAL_TABLET | Freq: Once | ORAL | Status: AC
  Administered 2020-12-30: 200 mg via ORAL
  Filled 2020-12-29: qty 1

## 2020-12-29 MED ORDER — POTASSIUM CHLORIDE CRYS ER 20 MEQ PO TBCR
40.0000 meq | EXTENDED_RELEASE_TABLET | Freq: Once | ORAL | Status: AC
  Administered 2020-12-29: 40 meq via ORAL
  Filled 2020-12-29: qty 2

## 2020-12-29 MED ORDER — MAGNESIUM SULFATE 2 GM/50ML IV SOLN
2.0000 g | Freq: Once | INTRAVENOUS | Status: AC
  Administered 2020-12-29: 2 g via INTRAVENOUS
  Filled 2020-12-29: qty 50

## 2020-12-29 MED ORDER — SPIRONOLACTONE 25 MG PO TABS
50.0000 mg | ORAL_TABLET | Freq: Every day | ORAL | Status: DC
  Administered 2020-12-29 – 2021-01-01 (×4): 50 mg via ORAL
  Filled 2020-12-29 (×4): qty 2

## 2020-12-29 MED ORDER — IBUPROFEN 200 MG PO TABS: 200.0000 mg | ORAL_TABLET | Freq: Four times a day (QID) | ORAL | Status: DC | PRN

## 2020-12-29 NOTE — Progress Notes (Addendum)
Daily Rounding Note  12/29/2020, 12:04 PM  LOS: 2 days   SUBJECTIVE:   Chief complaint:   Abdominal pain.  SBP.  Abdominal pain is much better.  Pt taking home doses of oral Dilaudid.  Having regular bowel movements.  No nausea, tolerating solid food.  On the bedside table patient had a container of Gatorade.  Had a discussion with the patient regarding the amount of sodium in Gatorade and similar beverages.  Discussed the fact that he needs to limit sodium intake.  OBJECTIVE:         Vital signs in last 24 hours:    Temp:  [98.9 F (37.2 C)-101.6 F (38.7 C)] 100.4 F (38 C) (06/30 1108) Pulse Rate:  [100-106] 100 (06/30 1108) Resp:  [14-18] 18 (06/30 1108) BP: (111-127)/(65-73) 111/67 (06/30 1108) SpO2:  [95 %-98 %] 98 % (06/30 1108) Last BM Date: 12/26/20 Filed Weights   12/27/20 0802 12/27/20 2208  Weight: 86.2 kg 90.8 kg   General: Looks well.  Comfortable. Heart: RRR. Chest: No labored breathing or cough.  Lungs clear bilaterally. Abdomen: Not tender or distended.  Active bowel sounds.  No obvious ascites or fluid wave. Extremities: No CCE. Neuro/Psych: Appropriate.  Alert and oriented x3.  No tremors, no asterixis.  Intake/Output from previous day: 06/29 0701 - 06/30 0700 In: 200 [IV Piggyback:200] Out: -   Intake/Output this shift: No intake/output data recorded.  Lab Results: Recent Labs    12/27/20 0810 12/28/20 0349 12/29/20 0300  WBC 3.4* 26.5* 12.5*  HGB 13.1 12.5* 10.9*  HCT 37.8* 35.4* 31.1*  PLT 95* 85* 61*   BMET Recent Labs    12/27/20 0810 12/28/20 0349 12/29/20 0300  NA 137 133* 129*  K 3.5 3.9 3.4*  CL 98 99 96*  CO2 31 26 28   GLUCOSE 135* 139* 142*  BUN 5* 11 13  CREATININE 0.89 1.06 0.99  CALCIUM 8.8* 8.7* 8.2*   LFT Recent Labs    12/27/20 0810 12/28/20 0349 12/29/20 0300  PROT 5.8* 5.5* 5.2*  ALBUMIN 2.6* 2.3* 2.4*  AST 51* 43* 37  ALT 25 22 22   ALKPHOS  141* 135* 115  BILITOT 10.8* 13.5* 12.1*   PT/INR Recent Labs    12/28/20 0349 12/29/20 0300  LABPROT 25.4* 26.6*  INR 2.3* 2.5*   Hepatitis Panel No results for input(s): HEPBSAG, HCVAB, HEPAIGM, HEPBIGM in the last 72 hours.  Studies/Results: CT ABDOMEN PELVIS W CONTRAST  Result Date: 12/27/2020 CLINICAL DATA:  Left upper quadrant abdominal pain. EXAM: CT ABDOMEN AND PELVIS WITH CONTRAST TECHNIQUE: Multidetector CT imaging of the abdomen and pelvis was performed using the standard protocol following bolus administration of intravenous contrast. CONTRAST:  12/31/20 OMNIPAQUE IOHEXOL 300 MG/ML  SOLN COMPARISON:  August 08, 2020 FINDINGS: Lower chest: Hypoventilatory change in the dependent lungs. Hepatobiliary: Cirrhotic hepatic morphology. Stable scattered tiny hypodensities in the liver, likely cysts. Gallbladder is dilated similar to prior examination measuring 6.5 cm in maximum diameter. There is mild intra and extrahepatic biliary ductal dilation with the common duct measuring up to 11-12 mm, similar multiple priors and and previously evaluated with MRI abdomen on January 27, 2020 sequela of patient's dilated gallbladder. Pancreas: No pancreatic ductal dilation. No evidence of acute pancreatic inflammation. Spleen: No splenomegaly.  Benign splenic cleft. Adrenals/Urinary Tract: Adrenal glands are unremarkable. Kidneys are normal, without renal calculi, solid enhancing lesion, or hydronephrosis. Bladder is unremarkable for degree of distension. Stomach/Bowel: Stomach is grossly unremarkable.  No pathologic dilation of small bowel. The appendix appears unremarkable. Mild diffuse colonic wall thickening extending from the ascending colon to the proximal sigmoid colon. Vascular/Lymphatic: No abdominal aortic aneurysm. Recannulized periumbilical vein. Gastric, esophageal, and perisplenic varices. The portal, splenic and superior mesenteric veins are patent. Prominent upper abdominal lymph nodes, similar  to prior, not pathologically enlarged by size criteria and favored reactive. No pathologically enlarged abdominal or pelvic lymph nodes. Reproductive: Prostate is unremarkable. Other: Small volume abdominopelvic ascites. No pneumoperitoneum. No portal venous gas. No pneumatosis. Anasarca. Musculoskeletal: No acute or significant osseous abnormality. Right gynecomastia partially visualized. IMPRESSION: 1. Cirrhotic hepatic morphology with evidence of portal hypertension including abdominal collateralized vasculature, small volume abdominopelvic ascites and anasarca. 2. Slightly decreased diffuse colonic wall thickening extending from the ascending colon to the proximal sigmoid colon, which may reflect portal colopathy or less likely other infectious or inflammatory colitis. 3. Dilated gallbladder and mild intra and extrahepatic biliary ductal dilation, similar to prior examination and likely sequela of patient's hepatocellular disease. However acalculous cholecystitis not excluded, consider further evaluation with nuclear medicine HIDA scan. Electronically Signed   By: Maudry Mayhew MD   On: 12/27/2020 14:42    ASSESMENT:      Abdominal pain.  SBP, recurrent vs ongoing.  1.1 L paracentesis 6/28 with fluid positive for SBP.  SBP diagnosis 08/2020, pt not compliant with Cipro prophylaxis.  Day 2 Maxipime.   Compliant with spironolactone 50 mg daily at home.  CT scan showing colonic wall thickening, ? portal colopathy, less likely infectious or inflammatory colitis.  Persistent mild intra-/extrahepatic biliary ductal dilatation.  Received infusion of albumin yesterday and plan to repeat infusion tomorrow.  WBCs 26.5 >> 12.5.     Cirrhosis of the liver.           acute hepatitis serologies previously negative.  Will need vaccination for hepatitis A and B.     Hyponatremia.  Nonbleeding grade 1-2 esophageal varices, portal hypertensive gastropathy, gastritis on EGD 12/2019.  H. pylori positive and treated with  bismuth, Flagyl, doxycycline, PPI after EGD.  Patient not taking PPI etc. for over 1 year.  Dr Sunnie Nielsen started Carvedilol yesterday 6/29.       Chronic pain, chronic daily Dilaudid.     Macrocytic anemia.     Thrombocytopenia.  Chronic.     Hypokalemia.  Received oral potassium yesterday and again today.      Coagulopathy, acute on chronic.  Day 2/3 oral vitamin K.     Glucose intolerance vs DM 2.     Saddle pulmonary embolus 2017.  Previously but no longer on Plavix.   PLAN      Reminded pt to pay close attention to prescribed medicines when he discharges home.  Discussed sodium restricted diet and will ask RD to provide guidance on 2 g sodium diet.     Pt now tells me that he may not have been taking spironolactone PTA.  Can probably restart spironolactone 50 mg daily tomorrow,?  Add low-dose Lasix as well.      Second albumin infusion tomorrow.     At discharge switch over to Cipro 500 mg po daily.    Jennye Moccasin  12/29/2020, 12:04 PM Phone [phone]    Attending physician's note   I have taken an interval history, reviewed the chart and examined the patient. I agree with the Advanced Practitioner's note, impression and recommendations.    Decompensated cirrhosis.  Meld 26  SBP improving Renal function stable, if  remains stable we will plan to start low-dose diuretics over the weekend Low-sodium diet IV albumin 100 g tomorrow  Continue broad-spectrum antibiotics for 10-day course, followed by daily Cipro or Bactrim for secondary prophylaxis to prevent recurrent SBP  Given he is hemodynamically stable, ok to continue low-dose nonselective beta-blocker for esophageal varices bleeding prophylaxis.  Biliary duct dilation likely secondary to chronic narcotics, no evidence of choledocholithiasis or mass lesion based on imaging  GI will continue to follow along   I have spent 35 minutes of patient care (this includes precharting, chart review, review of results,  face-to-face time used for counseling as well as treatment plan and follow-up. The patient was provided an opportunity to ask questions and all were answered. The patient agreed with the plan and demonstrated an understanding of the instructions.  Iona Beard , MD [phone]

## 2020-12-29 NOTE — Progress Notes (Addendum)
PROGRESS NOTE    MAAHIR Bass  SEG:[id number] DOB: 05-24-1971 DOA: 12/27/2020 PCP: Armstead Peaks., MD   Brief Narrative: 50 year old with past medical history significant for alcoholic cirrhosis, prior alcohol use quit drinking alcohol after hospitalization in June 2021, SBP, esophageal varices, A. fib not on anticoagulation, prior SBP presented to the ED complaining of abdominal pain, worsening abdominal distension,  nausea.  Patient underwent paracentesis by IR yielding 1.1 L on 6/28 the day of admission.  CT abdomen and pelvis showed findings consistent with hepatic cirrhosis, decreased diffuse colonic wall thickening extending from the ascending colon to the proximal sigmoid which may reflect portal colopathy or less likely infectious or inflammatory colitis.  Patient admitted with SBP and decompensated cirrhosis.  Assessment & Plan:   Principal Problem:   SBP (spontaneous bacterial peritonitis) (HCC) Active Problems:   Thrombocytopenia (HCC)   Coagulopathy (HCC)   Unspecified atrial fibrillation (HCC)   Leukopenia   1-Decompensated Alcoholic Liver Cirrhosis complicated by SBP;  -Underwent paracentesis yielding 1.1 L dark yellow fluid.  Ascites fluid showing white blood cell of 5975, neutrophil count 89%, cultures growing gram-negative rods -Antibiotics changed to cefepime.  -WBC trending down. Still having fevers.  -GI consulted, started on Albumin.  -Needs antibiotics for 10 days course and follow by prophylaxis with cipro .  -Started on spironolactone today.   2-Alcohol use disorder in remission  Paroxysmal A. fib: Resume carvedilol Not on anticoagulation due to chronic thrombocytopenia and coagulopathy.  Hyponatremia; Related to cirrhosis. Monitor.  Chronic pain; Continue with home dose dilaudid.  Hypokalemia; kcl 40 meq times 2.  Hypomagnesemia; IV magnesium.  Thrombocytopenia; related to cirrhosis.     Estimated body mass index is 30.43 kg/m as calculated  from the following:   Height as of this encounter: 5\' 8"  (1.727 m).   Weight as of this encounter: 90.8 kg.   DVT prophylaxis: SCD Code Status: Full code Family Communication: care discussed with patient/ Disposition Plan:  Status is: Inpatient  Remains inpatient appropriate because:IV treatments appropriate due to intensity of illness or inability to take PO  Dispo: The patient is from: Home              Anticipated d/c is to: Home              Patient currently is not medically stable to d/c.   Difficult to place patient No        Consultants:  GI  Procedures:  Paracentesis 6/28  Antimicrobials:    Subjective: He is feeling better. Abdominal pain has improved.    Objective: Vitals:   12/28/20 2325 12/29/20 0450 12/29/20 0500 12/29/20 1108  BP: 112/65 127/69  111/67  Pulse: (!) 105 (!) 101  100  Resp: 14 17  18   Temp: 99.9 F (37.7 C) (!) 101.6 F (38.7 C) 99.9 F (37.7 C) (!) 100.4 F (38 C)  TempSrc: Oral Oral Axillary Oral  SpO2: 95% 98%  98%  Weight:      Height:        Intake/Output Summary (Last 24 hours) at 12/29/2020 1504 Last data filed at 12/29/2020 0300 Gross per 24 hour  Intake 200 ml  Output --  Net 200 ml    Filed Weights   12/27/20 0802 12/27/20 2208  Weight: 86.2 kg 90.8 kg    Examination:  General exam: NAD Respiratory system: CTA Cardiovascular system: S 1, S 2 RRR Gastrointestinal system: BS present, soft, nt Central nervous system: Alert Extremities: Symmetric power  Data Reviewed: I have personally reviewed following labs and imaging studies  CBC: Recent Labs  Lab 12/27/20 0810 12/28/20 0349 12/29/20 0300  WBC 3.4* 26.5* 12.5*  NEUTROABS 1.4*  --   --   HGB 13.1 12.5* 10.9*  HCT 37.8* 35.4* 31.1*  MCV 102.4* 100.6* 100.3*  PLT 95* 85* 61*    Basic Metabolic Panel: Recent Labs  Lab 12/27/20 0810 12/28/20 0349 12/29/20 0300  NA 137 133* 129*  K 3.5 3.9 3.4*  CL 98 99 96*  CO2 31 26 28   GLUCOSE  135* 139* 142*  BUN 5* 11 13  CREATININE 0.89 1.06 0.99  CALCIUM 8.8* 8.7* 8.2*  MG  --   --  1.6*    GFR: Estimated Creatinine Clearance: 95.6 mL/min (by C-G formula based on SCr of 0.99 mg/dL). Liver Function Tests: Recent Labs  Lab 12/27/20 0810 12/28/20 0349 12/29/20 0300  AST 51* 43* 37  ALT 25 22 22   ALKPHOS 141* 135* 115  BILITOT 10.8* 13.5* 12.1*  PROT 5.8* 5.5* 5.2*  ALBUMIN 2.6* 2.3* 2.4*    Recent Labs  Lab 12/27/20 0810  LIPASE 36    No results for input(s): AMMONIA in the last 168 hours. Coagulation Profile: Recent Labs  Lab 12/27/20 0810 12/28/20 0349 12/29/20 0300  INR 2.1* 2.3* 2.5*    Cardiac Enzymes: No results for input(s): CKTOTAL, CKMB, CKMBINDEX, TROPONINI in the last 168 hours. BNP (last 3 results) No results for input(s): PROBNP in the last 8760 hours. HbA1C: No results for input(s): HGBA1C in the last 72 hours. CBG: Recent Labs  Lab 12/28/20 0612 12/28/20 1107 12/28/20 1600 12/28/20 2109 12/29/20 0603  GLUCAP 115* 130* 179* 158* 122*    Lipid Profile: No results for input(s): CHOL, HDL, LDLCALC, TRIG, CHOLHDL, LDLDIRECT in the last 72 hours. Thyroid Function Tests: No results for input(s): TSH, T4TOTAL, FREET4, T3FREE, THYROIDAB in the last 72 hours. Anemia Panel: No results for input(s): VITAMINB12, FOLATE, FERRITIN, TIBC, IRON, RETICCTPCT in the last 72 hours. Sepsis Labs: No results for input(s): PROCALCITON, LATICACIDVEN in the last 168 hours.  Recent Results (from the past 240 hour(s))  Resp Panel by RT-PCR (Flu A&B, Covid) Nasopharyngeal Swab     Status: None   Collection Time: 12/27/20  8:15 AM   Specimen: Nasopharyngeal Swab; Nasopharyngeal(NP) swabs in vial transport medium  Result Value Ref Range Status   SARS Coronavirus 2 by RT PCR NEGATIVE NEGATIVE Final    Comment: (NOTE) SARS-CoV-2 target nucleic acids are NOT DETECTED.  The SARS-CoV-2 RNA is generally detectable in upper respiratory specimens during  the acute phase of infection. The lowest concentration of SARS-CoV-2 viral copies this assay can detect is 138 copies/mL. A negative result does not preclude SARS-Cov-2 infection and should not be used as the sole basis for treatment or other patient management decisions. A negative result may occur with  improper specimen collection/handling, submission of specimen other than nasopharyngeal swab, presence of viral mutation(s) within the areas targeted by this assay, and inadequate number of viral copies(<138 copies/mL). A negative result must be combined with clinical observations, patient history, and epidemiological information. The expected result is Negative.  Fact Sheet for Patients:  12/31/20  Fact Sheet for Healthcare Providers:  12/29/20  This test is no t yet approved or cleared by the [employer] FDA and  has been authorized for detection and/or diagnosis of SARS-CoV-2 by FDA under an Emergency Use Authorization (EUA). This EUA will remain  in effect (meaning this test can  be used) for the duration of the COVID-19 declaration under Section 564(b)(1) of the Act, 21 U.S.C.section 360bbb-3(b)(1), unless the authorization is terminated  or revoked sooner.       Influenza A by PCR NEGATIVE NEGATIVE Final   Influenza B by PCR NEGATIVE NEGATIVE Final    Comment: (NOTE) The Xpert Xpress SARS-CoV-2/FLU/RSV plus assay is intended as an aid in the diagnosis of influenza from Nasopharyngeal swab specimens and should not be used as a sole basis for treatment. Nasal washings and aspirates are unacceptable for Xpert Xpress SARS-CoV-2/FLU/RSV testing.  Fact Sheet for Patients: [employer]  Fact Sheet for Healthcare Providers: [employer]it  This test is not yet approved or cleared by the [place] FDA and has been authorized for detection and/or  diagnosis of SARS-CoV-2 by FDA under an Emergency Use Authorization (EUA). This EUA will remain in effect (meaning this test can be used) for the duration of the COVID-19 declaration under Section 564(b)(1) of the Act, 21 U.S.C. section 360bbb-3(b)(1), unless the authorization is terminated or revoked.  Performed at [facility], [address] [place], [place] [address]   Body fluid culture w Gram Stain     Status: None (Preliminary result)   Collection Time: 12/27/20 11:41 AM   Specimen: Abdomen; Peritoneal Fluid  Result Value Ref Range Status   Specimen Description PERITONEAL FLUID  Final   Special Requests ABDOMEN  Final   Gram Stain   Final    MODERATE WBC PRESENT, PREDOMINANTLY PMN NO ORGANISMS SEEN    Culture   Final    FEW GRAM NEGATIVE RODS CRITICAL RESULT CALLED TO, READ BACK BY AND VERIFIED WITH: RN RATILLA [id number] AT 944 AM BY CM SUSCEPTIBILITIES TO FOLLOW Performed at [facility], [address], [place], [place] [address]    Report Status PENDING  Incomplete          Radiology Studies: No results found.      Scheduled Meds:  carvedilol  6.25 mg Oral BID WC   phytonadione  10 mg Oral Daily   polyethylene glycol  17 g Oral Daily   potassium chloride  40 mEq Oral Once   spironolactone  50 mg Oral Daily   Continuous Infusions:  [START ON 12/30/2020] albumin human     ceFEPime (MAXIPIME) IV 2 g (12/29/20 1339)   magnesium sulfate bolus IVPB       LOS: 2 days    Time spent 35 minutes.     Alba Cory, MD [facility]   If 7PM-7AM, please contact night-coverage [email or web address]  12/29/2020, 3:04 PM

## 2020-12-29 NOTE — Progress Notes (Signed)
NT notified me of patient temp of 101.55F (Please see attached data). Assessed Patient and he said "I don't feel any different, neither hotter nor colder than before."   Temp was rechecked 10 minutes later at 0500 and resulted with 99.11F.   MD has been made aware.     12/29/20 0450  Assess: MEWS Score  Temp (!) 101.6 F (38.7 C)  BP 127/69  Pulse Rate (!) 101  Resp 17  Level of Consciousness Alert  SpO2 98 %  Assess: MEWS Score  MEWS Temp 2  MEWS Systolic 0  MEWS Pulse 1  MEWS RR 0  MEWS LOC 0  MEWS Score 3  MEWS Score Color Yellow  Assess: if the MEWS score is Yellow or Red  Were vital signs taken at a resting state? Yes  Focused Assessment No change from prior assessment  Early Detection of Sepsis Score *See Row Information* Low  MEWS guidelines implemented *See Row Information* No, vital signs rechecked

## 2020-12-30 DIAGNOSIS — K7031 Alcoholic cirrhosis of liver with ascites: Secondary | ICD-10-CM

## 2020-12-30 DIAGNOSIS — R1084 Generalized abdominal pain: Secondary | ICD-10-CM

## 2020-12-30 DIAGNOSIS — R17 Unspecified jaundice: Secondary | ICD-10-CM

## 2020-12-30 LAB — COMPREHENSIVE METABOLIC PANEL
ALT: 20 U/L (ref 0–44)
AST: 37 U/L (ref 15–41)
Albumin: 2.1 g/dL — ABNORMAL LOW (ref 3.5–5.0)
Alkaline Phosphatase: 104 U/L (ref 38–126)
Anion gap: 6 (ref 5–15)
BUN: 12 mg/dL (ref 6–20)
CO2: 24 mmol/L (ref 22–32)
Calcium: 7.9 mg/dL — ABNORMAL LOW (ref 8.9–10.3)
Chloride: 99 mmol/L (ref 98–111)
Creatinine, Ser: 1.08 mg/dL (ref 0.61–1.24)
GFR, Estimated: >60 mL/min (ref >60)
Glucose, Bld: 137 mg/dL — ABNORMAL HIGH (ref 70–99)
Potassium: 3.9 mmol/L (ref 3.5–5.1)
Sodium: 129 mmol/L — ABNORMAL LOW (ref 135–145)
Total Bilirubin: 9.5 mg/dL — ABNORMAL HIGH (ref 0.3–1.2)
Total Protein: 4.7 g/dL — ABNORMAL LOW (ref 6.5–8.1)

## 2020-12-30 LAB — CBC
HCT: 28.4 % — ABNORMAL LOW (ref 39.0–52.0)
Hemoglobin: 10.1 g/dL — ABNORMAL LOW (ref 13.0–17.0)
MCH: 35.2 pg — ABNORMAL HIGH (ref 26.0–34.0)
MCHC: 35.6 g/dL (ref 30.0–36.0)
MCV: 99 fL (ref 80.0–100.0)
Platelets: 51 10*3/uL — ABNORMAL LOW (ref 150–400)
RBC: 2.87 MIL/uL — ABNORMAL LOW (ref 4.22–5.81)
RDW: 13.4 % (ref 11.5–15.5)
WBC: 15.5 10*3/uL — ABNORMAL HIGH (ref 4.0–10.5)
nRBC: 0 % (ref 0.0–0.2)

## 2020-12-30 LAB — BODY FLUID CULTURE W GRAM STAIN

## 2020-12-30 LAB — PROTIME-INR
INR: 2.4 — ABNORMAL HIGH (ref 0.8–1.2)
Prothrombin Time: 25.7 seconds — ABNORMAL HIGH (ref 11.4–15.2)

## 2020-12-30 MED ORDER — FUROSEMIDE 20 MG PO TABS
20.0000 mg | ORAL_TABLET | Freq: Every day | ORAL | Status: DC
  Administered 2020-12-30 – 2021-01-01 (×3): 20 mg via ORAL
  Filled 2020-12-30 (×3): qty 1

## 2020-12-30 MED ORDER — SODIUM CHLORIDE 0.9 % IV SOLN
2.0000 g | INTRAVENOUS | Status: DC
  Administered 2020-12-30 – 2020-12-31 (×2): 2 g via INTRAVENOUS
  Filled 2020-12-30: qty 20
  Filled 2020-12-30: qty 2
  Filled 2020-12-30: qty 20

## 2020-12-30 NOTE — Care Plan (Signed)
Nutrition Education Note  RD consulted for nutrition education regarding 2 gram Sodium diet with 1500 ml fluid restriction.   Per pt he has a good appetite. He reports consuming frozen lasagna and gatroade at home PTA. 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   RD provided "Low Sodium Nutrition Therapy and Fluid Restriction" handouts from the Academy of [employer] Dietetics. Reviewed patient's dietary recall. Provided examples on ways to decrease sodium intake in diet. Discouraged intake of processed foods and use of salt shaker. Encouraged fresh fruits and vegetables as well as whole grain sources of carbohydrates to maximize fiber intake.   RD discussed why it is important for patient to adhere to diet recommendations, and emphasized the role of fluids, foods to avoid, and importance of weighing self daily. Teach back method used.  Expect fair compliance.  Body mass index is 30.43 kg/m.   Current diet order is 2 gram sodium, patient is consuming approximately >50% of meals at this time. Labs and medications reviewed. No further nutrition interventions warranted at this time. RD contact information provided. If additional nutrition issues arise, please re-consult RD.   Cammy Copa., RD, LDN, CNSC See AMiON for contact information

## 2020-12-30 NOTE — Care Management Important Message (Signed)
Important Message  Patient Details  Name: Jared Bass MRN: [id number] Date of Birth: [birth year]   Medicare Important Message Given:  Yes - Important Message mailed due to current National Emergency   Verbal consent obtained due to current National Emergency  Relationship to patient: Self Contact Name: Jodey Call Date: 12/30/20  Time: 1111 Phone: [phone] Outcome: No Answer/Busy Important Message mailed to: Patient address on file    [name] 12/30/2020, 11:12 AM

## 2020-12-30 NOTE — Care Management Important Message (Signed)
Important Message  Patient Details  Name: Jared Bass MRN: [id number] Date of Birth: 01/01/1971   Medicare Important Message Given:  Yes     [name] 12/30/2020, 3:19 PM

## 2020-12-30 NOTE — Plan of Care (Signed)
  Problem: Education: Goal: Knowledge of General Education information will improve Description Including pain rating scale, medication(s)/side effects and non-pharmacologic comfort measures Outcome: Progressing   

## 2020-12-30 NOTE — Progress Notes (Addendum)
Daily Rounding Note  12/30/2020, 11:51 AM  LOS: 3 days   SUBJECTIVE:   Chief complaint:    SBP.  Abdominal pain.  Pain is better but he still taking pain meds for pain that is tingling in the flanks bilaterally.  No fevers.  Tolerating solid food.  Having solid stools which are brown.  Right now talking with the dietitian about low-sodium diet.  OBJECTIVE:         Vital signs in last 24 hours:    Temp:  [98.4 F (36.9 C)-101.2 F (38.4 C)] 98.8 F (37.1 C) (07/01 1134) Pulse Rate:  [87-102] 87 (07/01 1134) Resp:  [18-19] 18 (07/01 1134) BP: (101-111)/(62-71) 101/67 (07/01 1134) SpO2:  [95 %-98 %] 98 % (07/01 1134) Last BM Date: 12/26/20 Filed Weights   12/27/20 0802 12/27/20 2208  Weight: 86.2 kg 90.8 kg   General: Looks well.  NAD.  Comfortable Heart: RRR. Chest: No labored breathing or cough Abdomen: Nontender, nondistended.  Active bowel sounds. Extremities: No CCE. Neuro/Psych: Alert and oriented x3.  No tremors or gross weakness.  Calm.  Intake/Output from previous day: 06/30 0701 - 07/01 0700 In: 211.2 [IV Piggyback:211.2] Out: 300 [Urine:300]  Intake/Output this shift: Total I/O In: 540 [P.O.:480; IV Piggyback:60] Out: -   Lab Results: Recent Labs    12/28/20 0349 12/29/20 0300 12/30/20 0333  WBC 26.5* 12.5* 15.5*  HGB 12.5* 10.9* 10.1*  HCT 35.4* 31.1* 28.4*  PLT 85* 61* 51*   BMET Recent Labs    12/28/20 0349 12/29/20 0300 12/30/20 0333  NA 133* 129* 129*  K 3.9 3.4* 3.9  CL 99 96* 99  CO2 26 28 24   GLUCOSE 139* 142* 137*  BUN 11 13 12   CREATININE 1.06 0.99 1.08  CALCIUM 8.7* 8.2* 7.9*   LFT Recent Labs    12/28/20 0349 12/29/20 0300 12/30/20 0333  PROT 5.5* 5.2* 4.7*  ALBUMIN 2.3* 2.4* 2.1*  AST 43* 37 37  ALT 22 22 20   ALKPHOS 135* 115 104  BILITOT 13.5* 12.1* 9.5*   PT/INR Recent Labs    12/29/20 0300 12/30/20 0333  LABPROT 26.6* 25.7*  INR 2.5* 2.4*    Hepatitis Panel No results for input(s): HEPBSAG, HCVAB, HEPAIGM, HEPBIGM in the last 72 hours.  Studies/Results: No results found.  ASSESMENT:      Abdominal pain.  SBP.   1.1 L paracentesis 6/28 with fluid positive for SBP.  SBP diagnosis 08/2020, pt not compliant with Cipro prophylaxis.  Day 3 Maxipime.   likely non-Compliant with spironolactone 50 mg daily at home (caveat pt poor historian and recall of meds he tok/takes).  CT scan showing colonic wall thickening, ? portal colopathy, less likely infectious or inflammatory colitis.  Persistent mild intra-/extrahepatic biliary ductal dilatation.  Received infusion of albumin 6/29 and 7/1.  WBCs 26.5 >> 12.5 >> 15.5.  Abdominal pain improved but not resolved.      Cirrhosis of the liver.          acute hepatitis serologies previously negative.  Will need vaccination for hepatitis A and B.      Hyponatremia.  Stable at 129.     Nonbleeding grade 1-2 esophageal varices, portal hypertensive gastropathy, gastritis on EGD 12/2019.  H. pylori positive and treated with bismuth, Flagyl, doxycycline, PPI after EGD.  Patient not taking PPI etc. for over 1 year.  Dr 7/29 started Carvedilol yesterday 6/29.        Chronic pain,  chronic daily Dilaudid.      Macrocytic anemia.  Hgb 10.1.         Thrombocytopenia.  Chronic.  Platelets 51 K.          Coagulopathy, acute on chronic.  Day 3/3 oral vitamin K w little improvement.  .      Glucose intolerance vs DM 2.      Saddle pulmonary embolus 2017.  Previously but no longer on Plavix.  PLAN   Finish 10-day course of broad-spectrum antibiotics, Maxipime while inpatient.  At discharge should be taking SBP prophylaxis of Cipro 500 mg daily.  Bactrim also an option but with his thrombocytopenia prefer to avoid Bactrim.       Jennye Moccasin  12/30/2020, 11:51 AM Phone [phone]    Attending physician's note   I have taken an interval history, reviewed the chart and examined the patient. I  agree with the Advanced Practitioner's note, impression and recommendations.   SBP improving Continue IV ceftriaxone while inpatient and can transition to Cipro 500 mg twice daily on discharge to complete 10 to 14-day course of antibiotics. Will need to stay on Cipro 500mg  daily long-term for secondary prophylaxis to prevent recurrent SBP  Worsening ascites, renal function is stable.  We will plan to start low-dose diuretics Lasix 20 mg and Aldactone 50 mg daily Monitor renal function daily  Follow-up with outpatient GI on discharge  GI will sign off, is available if needed. Please call with any questions.      I have spent 35 minutes of patient care (this includes precharting, chart review, review of results, face-to-face time used for counseling as well as treatment plan and follow-up. The patient was provided an opportunity to ask questions and all were answered. The patient agreed with the plan and demonstrated an understanding of the instructions.  , MD [phone]

## 2020-12-30 NOTE — Progress Notes (Signed)
PROGRESS NOTE    Jared Bass  TKZ:[id number] DOB: 10/29/1970 DOA: 12/27/2020 PCP: Armstead Peaks., MD   Brief Narrative: 50 year old with past medical history significant for alcoholic cirrhosis, prior alcohol use quit drinking alcohol after hospitalization in June 2021, SBP, esophageal varices, A. fib not on anticoagulation, prior SBP presented to the ED complaining of abdominal pain, worsening abdominal distension,  nausea.  Patient underwent paracentesis by IR yielding 1.1 L on 6/28 the day of admission.  CT abdomen and pelvis showed findings consistent with hepatic cirrhosis, decreased diffuse colonic wall thickening extending from the ascending colon to the proximal sigmoid which may reflect portal colopathy or less likely infectious or inflammatory colitis.  Patient admitted with SBP and decompensated cirrhosis.  Assessment & Plan:   Principal Problem:   SBP (spontaneous bacterial peritonitis) (HCC) Active Problems:   Thrombocytopenia (HCC)   Coagulopathy (HCC)   Unspecified atrial fibrillation (HCC)   Leukopenia   1-Decompensated Alcoholic Liver Cirrhosis complicated by SBP;  -Underwent paracentesis yielding 1.1 L dark yellow fluid.  Ascites fluid showing white blood cell of 5975, neutrophil count 89%, cultures growing gram-negative rods -Ascites fluid growing E coli. Sensitive to ceftriaxone. Change antibiotics to ceftriaxone. Day 3 antibiotics.  -fever last night.  -GI consulted, started on Albumin.  -Needs antibiotics for 10 days course and follow by prophylaxis with cipro .  -Started on spironolactone  -Awaiting for patient to be afebrile for 24 hours.  -Bilirubin trending down.   2-Alcohol use disorder in remission  Paroxysmal A. fib: Resume carvedilol Not on anticoagulation due to chronic thrombocytopenia and coagulopathy.  Hyponatremia; Related to cirrhosis. Monitor. Stable.  Chronic pain; Continue with home dose dilaudid.  Hypokalemia; replaced.   Hypomagnesemia; Received IV magnesium. Repeat labs tomorrow.  Thrombocytopenia; related to cirrhosis.     Estimated body mass index is 30.43 kg/m as calculated from the following:   Height as of this encounter: 5\' 8"  (1.727 m).   Weight as of this encounter: 90.8 kg.   DVT prophylaxis: SCD Code Status: Full code Family Communication: care discussed with patient/ Disposition Plan:  Status is: Inpatient  Remains inpatient appropriate because:IV treatments appropriate due to intensity of illness or inability to take PO  Dispo: The patient is from: Home              Anticipated d/c is to: Home              Patient currently is not medically stable to d/c.   Difficult to place patient No        Consultants:  GI  Procedures:  Paracentesis 6/28  Antimicrobials:    Subjective: He report some abdominal pain this am. Worse with movement.  Having BM/   Objective: Vitals:   12/29/20 2325 12/30/20 0209 12/30/20 0400 12/30/20 1134  BP: 102/69  111/71 101/67  Pulse: 100  88 87  Resp: 19  18 18   Temp: (!) 101.2 F (38.4 C) 99.4 F (37.4 C) 98.4 F (36.9 C) 98.8 F (37.1 C)  TempSrc: Oral Oral Oral Oral  SpO2: 95%  98% 98%  Weight:      Height:        Intake/Output Summary (Last 24 hours) at 12/30/2020 1354 Last data filed at 12/30/2020 0900 Gross per 24 hour  Intake 751.15 ml  Output 300 ml  Net 451.15 ml    Filed Weights   12/27/20 0802 12/27/20 2208  Weight: 86.2 kg 90.8 kg    Examination:  General exam: NAD Respiratory  system: CTA Cardiovascular system: S 1, S 2 RRR Gastrointestinal system: BS present, soft, tenderness, distended,  Central nervous system: Alert, follows command Extremities: Symmetric power.      Data Reviewed: I have personally reviewed following labs and imaging studies  CBC: Recent Labs  Lab 12/27/20 0810 12/28/20 0349 12/29/20 0300 12/30/20 0333  WBC 3.4* 26.5* 12.5* 15.5*  NEUTROABS 1.4*  --   --   --   HGB 13.1  12.5* 10.9* 10.1*  HCT 37.8* 35.4* 31.1* 28.4*  MCV 102.4* 100.6* 100.3* 99.0  PLT 95* 85* 61* 51*    Basic Metabolic Panel: Recent Labs  Lab 12/27/20 0810 12/28/20 0349 12/29/20 0300 12/30/20 0333  NA 137 133* 129* 129*  K 3.5 3.9 3.4* 3.9  CL 98 99 96* 99  CO2 31 26 28 24   GLUCOSE 135* 139* 142* 137*  BUN 5* 11 13 12   CREATININE 0.89 1.06 0.99 1.08  CALCIUM 8.8* 8.7* 8.2* 7.9*  MG  --   --  1.6*  --     GFR: Estimated Creatinine Clearance: 87.6 mL/min (by C-G formula based on SCr of 1.08 mg/dL). Liver Function Tests: Recent Labs  Lab 12/27/20 0810 12/28/20 0349 12/29/20 0300 12/30/20 0333  AST 51* 43* 37 37  ALT 25 22 22 20   ALKPHOS 141* 135* 115 104  BILITOT 10.8* 13.5* 12.1* 9.5*  PROT 5.8* 5.5* 5.2* 4.7*  ALBUMIN 2.6* 2.3* 2.4* 2.1*    Recent Labs  Lab 12/27/20 0810  LIPASE 36    No results for input(s): AMMONIA in the last 168 hours. Coagulation Profile: Recent Labs  Lab 12/27/20 0810 12/28/20 0349 12/29/20 0300 12/30/20 0333  INR 2.1* 2.3* 2.5* 2.4*    Cardiac Enzymes: No results for input(s): CKTOTAL, CKMB, CKMBINDEX, TROPONINI in the last 168 hours. BNP (last 3 results) No results for input(s): PROBNP in the last 8760 hours. HbA1C: No results for input(s): HGBA1C in the last 72 hours. CBG: Recent Labs  Lab 12/28/20 0612 12/28/20 1107 12/28/20 1600 12/28/20 2109 12/29/20 0603  GLUCAP 115* 130* 179* 158* 122*    Lipid Profile: No results for input(s): CHOL, HDL, LDLCALC, TRIG, CHOLHDL, LDLDIRECT in the last 72 hours. Thyroid Function Tests: No results for input(s): TSH, T4TOTAL, FREET4, T3FREE, THYROIDAB in the last 72 hours. Anemia Panel: No results for input(s): VITAMINB12, FOLATE, FERRITIN, TIBC, IRON, RETICCTPCT in the last 72 hours. Sepsis Labs: No results for input(s): PROCALCITON, LATICACIDVEN in the last 168 hours.  Recent Results (from the past 240 hour(s))  Resp Panel by RT-PCR (Flu A&B, Covid) Nasopharyngeal Swab      Status: None   Collection Time: 12/27/20  8:15 AM   Specimen: Nasopharyngeal Swab; Nasopharyngeal(NP) swabs in vial transport medium  Result Value Ref Range Status   SARS Coronavirus 2 by RT PCR NEGATIVE NEGATIVE Final    Comment: (NOTE) SARS-CoV-2 target nucleic acids are NOT DETECTED.  The SARS-CoV-2 RNA is generally detectable in upper respiratory specimens during the acute phase of infection. The lowest concentration of SARS-CoV-2 viral copies this assay can detect is 138 copies/mL. A negative result does not preclude SARS-Cov-2 infection and should not be used as the sole basis for treatment or other patient management decisions. A negative result may occur with  improper specimen collection/handling, submission of specimen other than nasopharyngeal swab, presence of viral mutation(s) within the areas targeted by this assay, and inadequate number of viral copies(<138 copies/mL). A negative result must be combined with clinical observations, patient history, and epidemiological information.  The expected result is Negative.  Fact Sheet for Patients:  BloggerCourse.com  Fact Sheet for Healthcare Providers:  SeriousBroker.it  This test is no t yet approved or cleared by the [place] FDA and  has been authorized for detection and/or diagnosis of SARS-CoV-2 by FDA under an Emergency Use Authorization (EUA). This EUA will remain  in effect (meaning this test can be used) for the duration of the COVID-19 declaration under Section 564(b)(1) of the Act, 21 U.S.C.section 360bbb-3(b)(1), unless the authorization is terminated  or revoked sooner.       Influenza A by PCR NEGATIVE NEGATIVE Final   Influenza B by PCR NEGATIVE NEGATIVE Final    Comment: (NOTE) The Xpert Xpress SARS-CoV-2/FLU/RSV plus assay is intended as an aid in the diagnosis of influenza from Nasopharyngeal swab specimens and should not be used as a sole basis  for treatment. Nasal washings and aspirates are unacceptable for Xpert Xpress SARS-CoV-2/FLU/RSV testing.  Fact Sheet for Patients: BloggerCourse.com  Fact Sheet for Healthcare Providers: SeriousBroker.it  This test is not yet approved or cleared by the [place] FDA and has been authorized for detection and/or diagnosis of SARS-CoV-2 by FDA under an Emergency Use Authorization (EUA). This EUA will remain in effect (meaning this test can be used) for the duration of the COVID-19 declaration under Section 564(b)(1) of the Act, 21 U.S.C. section 360bbb-3(b)(1), unless the authorization is terminated or revoked.  Performed at [facility], [address], [place], [place] [address]   Body fluid culture w Gram Stain     Status: None   Collection Time: 12/27/20 11:41 AM   Specimen: Abdomen; Peritoneal Fluid  Result Value Ref Range Status   Specimen Description PERITONEAL FLUID  Final   Special Requests ABDOMEN  Final   Gram Stain   Final    MODERATE WBC PRESENT, PREDOMINANTLY PMN NO ORGANISMS SEEN    Culture   Final    FEW ESCHERICHIA COLI CRITICAL RESULT CALLED TO, READ BACK BY AND VERIFIED WITH: RN Dierdre Forth [id number] AT 944 AM BY CM Performed at [facility], [address], [place], [place] [address]    Report Status 12/30/2020 FINAL  Final   Organism ID, Bacteria ESCHERICHIA COLI  Final      Susceptibility   Escherichia coli - MIC*    AMPICILLIN <=2 SENSITIVE Sensitive     CEFAZOLIN <=4 SENSITIVE Sensitive     CEFEPIME <=0.12 SENSITIVE Sensitive     CEFTAZIDIME <=1 SENSITIVE Sensitive     CEFTRIAXONE <=0.25 SENSITIVE Sensitive     CIPROFLOXACIN <=0.25 SENSITIVE Sensitive     GENTAMICIN <=1 SENSITIVE Sensitive     IMIPENEM <=0.25 SENSITIVE Sensitive     TRIMETH/SULFA <=20 SENSITIVE Sensitive     AMPICILLIN/SULBACTAM <=2 SENSITIVE Sensitive     PIP/TAZO <=4 SENSITIVE Sensitive     * FEW ESCHERICHIA COLI           Radiology Studies: No results found.      Scheduled Meds:  carvedilol  6.25 mg Oral BID WC   polyethylene glycol  17 g Oral Daily   spironolactone  50 mg Oral Daily   Continuous Infusions:  cefTRIAXone (ROCEPHIN)  IV       LOS: 3 days    Time spent 35 minutes.     Alba Cory, MD [facility]   If 7PM-7AM, please contact night-coverage [email or web address]  12/30/2020, 1:54 PM

## 2020-12-31 LAB — CBC
HCT: 30.3 % — ABNORMAL LOW (ref 39.0–52.0)
Hemoglobin: 10.7 g/dL — ABNORMAL LOW (ref 13.0–17.0)
MCH: 35 pg — ABNORMAL HIGH (ref 26.0–34.0)
MCHC: 35.3 g/dL (ref 30.0–36.0)
MCV: 99 fL (ref 80.0–100.0)
Platelets: 61 10*3/uL — ABNORMAL LOW (ref 150–400)
RBC: 3.06 MIL/uL — ABNORMAL LOW (ref 4.22–5.81)
RDW: 13.4 % (ref 11.5–15.5)
WBC: 12.5 10*3/uL — ABNORMAL HIGH (ref 4.0–10.5)
nRBC: 0 % (ref 0.0–0.2)

## 2020-12-31 LAB — COMPREHENSIVE METABOLIC PANEL
ALT: 21 U/L (ref 0–44)
AST: 31 U/L (ref 15–41)
Albumin: 2.2 g/dL — ABNORMAL LOW (ref 3.5–5.0)
Alkaline Phosphatase: 117 U/L (ref 38–126)
Anion gap: 5 (ref 5–15)
BUN: 13 mg/dL (ref 6–20)
CO2: 27 mmol/L (ref 22–32)
Calcium: 8.2 mg/dL — ABNORMAL LOW (ref 8.9–10.3)
Chloride: 98 mmol/L (ref 98–111)
Creatinine, Ser: 1.02 mg/dL (ref 0.61–1.24)
GFR, Estimated: >60 mL/min (ref >60)
Glucose, Bld: 137 mg/dL — ABNORMAL HIGH (ref 70–99)
Potassium: 4 mmol/L (ref 3.5–5.1)
Sodium: 130 mmol/L — ABNORMAL LOW (ref 135–145)
Total Bilirubin: 8.8 mg/dL — ABNORMAL HIGH (ref 0.3–1.2)
Total Protein: 5 g/dL — ABNORMAL LOW (ref 6.5–8.1)

## 2020-12-31 LAB — PROTIME-INR
INR: 2.1 — ABNORMAL HIGH (ref 0.8–1.2)
Prothrombin Time: 23.7 seconds — ABNORMAL HIGH (ref 11.4–15.2)

## 2020-12-31 LAB — MAGNESIUM: Magnesium: 2 mg/dL (ref 1.7–2.4)

## 2020-12-31 NOTE — Plan of Care (Signed)
  Problem: Clinical Measurements: Goal: Diagnostic test results will improve Outcome: Progressing   Problem: Clinical Measurements: Goal: Respiratory complications will improve Outcome: Progressing   Problem: Clinical Measurements: Goal: Cardiovascular complication will be avoided Outcome: Progressing   Problem: Elimination: Goal: Will not experience complications related to bowel motility Outcome: Progressing   Problem: Pain Managment: Goal: General experience of comfort will improve Outcome: Progressing   Problem: Safety: Goal: Ability to remain free from injury will improve Outcome: Progressing   Problem: Skin Integrity: Goal: Risk for impaired skin integrity will decrease Outcome: Progressing

## 2020-12-31 NOTE — Progress Notes (Signed)
PROGRESS NOTE    Jared Bass  CXK:[id number] DOB: 1971-02-28 DOA: 12/27/2020 PCP: Armstead Peaks., MD   Brief Narrative: 50 year old with past medical history significant for alcoholic cirrhosis, prior alcohol use quit drinking alcohol after hospitalization in June 2021, SBP, esophageal varices, A. fib not on anticoagulation, prior SBP presented to the ED complaining of abdominal pain, worsening abdominal distension,  nausea.  Patient underwent paracentesis by IR yielding 1.1 L on 6/28 the day of admission.  CT abdomen and pelvis showed findings consistent with hepatic cirrhosis, decreased diffuse colonic wall thickening extending from the ascending colon to the proximal sigmoid which may reflect portal colopathy or less likely infectious or inflammatory colitis.  Patient admitted with SBP and decompensated cirrhosis.  Assessment & Plan:   Principal Problem:   SBP (spontaneous bacterial peritonitis) (HCC) Active Problems:   Jaundice   Thrombocytopenia (HCC)   Coagulopathy (HCC)   Unspecified atrial fibrillation (HCC)   Leukopenia   Alcoholic cirrhosis of liver with ascites (HCC)   Generalized abdominal pain   1-Decompensated Alcoholic Liver Cirrhosis complicated by SBP;  -Underwent paracentesis yielding 1.1 L dark yellow fluid.  Ascites fluid showing white blood cell of 5975, neutrophil count 89%, cultures growing gram-negative rods -Ascites fluid growing E coli. Sensitive to ceftriaxone. Change antibiotics to ceftriaxone. Day 3 antibiotics.  -fever last night.  -GI consulted, started on Albumin.  -Needs antibiotics for 10 days course and follow by prophylaxis with cipro .  -Started on spironolactone  -afebrile. LFT trending down.  -Bilirubin trending down.  -Continue with ceftriaxone.  -Started on lasix and spironolactone.   2-Alcohol use disorder in remission  Paroxysmal A. fib: Resume carvedilol Not on anticoagulation due to chronic thrombocytopenia and  coagulopathy.  Hyponatremia; Related to cirrhosis. Monitor. Stable.  Chronic pain; Continue with home dose dilaudid.  Hypokalemia; replaced.  Hypomagnesemia; Received IV magnesium. Check Mg.  Thrombocytopenia; related to cirrhosis.     Estimated body mass index is 31.24 kg/m as calculated from the following:   Height as of this encounter: 5\' 8"  (1.727 m).   Weight as of this encounter: 93.2 kg.   DVT prophylaxis: SCD Code Status: Full code Family Communication: care discussed with patient/ Disposition Plan:  Status is: Inpatient  Remains inpatient appropriate because:IV treatments appropriate due to intensity of illness or inability to take PO  Dispo: The patient is from: Home              Anticipated d/c is to: Home PT  eval.               Patient currently is not medically stable to d/c.    Difficult to place patient No        Consultants:  GI  Procedures:  Paracentesis 6/28  Antimicrobials:    Subjective: Patient report abdominal pain. He doesn't feel good today to go home. He feels tired, weak.   Objective: Vitals:   12/30/20 2248 12/31/20 0510 12/31/20 1147 12/31/20 1221  BP: 115/76 116/71  114/75  Pulse: 96 90  89  Resp: 18 19  16   Temp: 99 F (37.2 C) 99.5 F (37.5 C)  99.3 F (37.4 C)  TempSrc: Oral Oral  Oral  SpO2: 100% 98%  100%  Weight:   93.2 kg   Height:        Intake/Output Summary (Last 24 hours) at 12/31/2020 1524 Last data filed at 12/31/2020 0539 Gross per 24 hour  Intake 480 ml  Output --  Net 480 ml  Filed Weights   12/27/20 0802 12/27/20 2208 12/31/20 1147  Weight: 86.2 kg 90.8 kg 93.2 kg    Examination:  General exam: NAD Respiratory system: CTA Cardiovascular system: S 1, S 2 RRR Gastrointestinal system: BS present, soft, nt Central nervous system: Alert, follows command Extremities: Symmetric power     Data Reviewed: I have personally reviewed following labs and imaging studies  CBC: Recent Labs  Lab  12/27/20 0810 12/28/20 0349 12/29/20 0300 12/30/20 0333 12/31/20 0049  WBC 3.4* 26.5* 12.5* 15.5* 12.5*  NEUTROABS 1.4*  --   --   --   --   HGB 13.1 12.5* 10.9* 10.1* 10.7*  HCT 37.8* 35.4* 31.1* 28.4* 30.3*  MCV 102.4* 100.6* 100.3* 99.0 99.0  PLT 95* 85* 61* 51* 61*    Basic Metabolic Panel: Recent Labs  Lab 12/27/20 0810 12/28/20 0349 12/29/20 0300 12/30/20 0333 12/31/20 0049  NA 137 133* 129* 129* 130*  K 3.5 3.9 3.4* 3.9 4.0  CL 98 99 96* 99 98  CO2 31 26 28 24 27   GLUCOSE 135* 139* 142* 137* 137*  BUN 5* 11 13 12 13   CREATININE 0.89 1.06 0.99 1.08 1.02  CALCIUM 8.8* 8.7* 8.2* 7.9* 8.2*  MG  --   --  1.6*  --   --     GFR: Estimated Creatinine Clearance: 93.8 mL/min (by C-G formula based on SCr of 1.02 mg/dL). Liver Function Tests: Recent Labs  Lab 12/27/20 0810 12/28/20 0349 12/29/20 0300 12/30/20 0333 12/31/20 0049  AST 51* 43* 37 37 31  ALT 25 22 22 20 21   ALKPHOS 141* 135* 115 104 117  BILITOT 10.8* 13.5* 12.1* 9.5* 8.8*  PROT 5.8* 5.5* 5.2* 4.7* 5.0*  ALBUMIN 2.6* 2.3* 2.4* 2.1* 2.2*    Recent Labs  Lab 12/27/20 0810  LIPASE 36    No results for input(s): AMMONIA in the last 168 hours. Coagulation Profile: Recent Labs  Lab 12/27/20 0810 12/28/20 0349 12/29/20 0300 12/30/20 0333 12/31/20 0049  INR 2.1* 2.3* 2.5* 2.4* 2.1*    Cardiac Enzymes: No results for input(s): CKTOTAL, CKMB, CKMBINDEX, TROPONINI in the last 168 hours. BNP (last 3 results) No results for input(s): PROBNP in the last 8760 hours. HbA1C: No results for input(s): HGBA1C in the last 72 hours. CBG: Recent Labs  Lab 12/28/20 0612 12/28/20 1107 12/28/20 1600 12/28/20 2109 12/29/20 0603  GLUCAP 115* 130* 179* 158* 122*    Lipid Profile: No results for input(s): CHOL, HDL, LDLCALC, TRIG, CHOLHDL, LDLDIRECT in the last 72 hours. Thyroid Function Tests: No results for input(s): TSH, T4TOTAL, FREET4, T3FREE, THYROIDAB in the last 72 hours. Anemia Panel: No  results for input(s): VITAMINB12, FOLATE, FERRITIN, TIBC, IRON, RETICCTPCT in the last 72 hours. Sepsis Labs: No results for input(s): PROCALCITON, LATICACIDVEN in the last 168 hours.  Recent Results (from the past 240 hour(s))  Resp Panel by RT-PCR (Flu A&B, Covid) Nasopharyngeal Swab     Status: None   Collection Time: 12/27/20  8:15 AM   Specimen: Nasopharyngeal Swab; Nasopharyngeal(NP) swabs in vial transport medium  Result Value Ref Range Status   SARS Coronavirus 2 by RT PCR NEGATIVE NEGATIVE Final    Comment: (NOTE) SARS-CoV-2 target nucleic acids are NOT DETECTED.  The SARS-CoV-2 RNA is generally detectable in upper respiratory specimens during the acute phase of infection. The lowest concentration of SARS-CoV-2 viral copies this assay can detect is 138 copies/mL. A negative result does not preclude SARS-Cov-2 infection and should not be used as the  sole basis for treatment or other patient management decisions. A negative result may occur with  improper specimen collection/handling, submission of specimen other than nasopharyngeal swab, presence of viral mutation(s) within the areas targeted by this assay, and inadequate number of viral copies(<138 copies/mL). A negative result must be combined with clinical observations, patient history, and epidemiological information. The expected result is Negative.  Fact Sheet for Patients:  BloggerCourse.com  Fact Sheet for Healthcare Providers:  SeriousBroker.it  This test is no t yet approved or cleared by the [place] FDA and  has been authorized for detection and/or diagnosis of SARS-CoV-2 by FDA under an Emergency Use Authorization (EUA). This EUA will remain  in effect (meaning this test can be used) for the duration of the COVID-19 declaration under Section 564(b)(1) of the Act, 21 U.S.C.section 360bbb-3(b)(1), unless the authorization is terminated  or revoked sooner.        Influenza A by PCR NEGATIVE NEGATIVE Final   Influenza B by PCR NEGATIVE NEGATIVE Final    Comment: (NOTE) The Xpert Xpress SARS-CoV-2/FLU/RSV plus assay is intended as an aid in the diagnosis of influenza from Nasopharyngeal swab specimens and should not be used as a sole basis for treatment. Nasal washings and aspirates are unacceptable for Xpert Xpress SARS-CoV-2/FLU/RSV testing.  Fact Sheet for Patients: BloggerCourse.com  Fact Sheet for Healthcare Providers: SeriousBroker.it  This test is not yet approved or cleared by the [place] FDA and has been authorized for detection and/or diagnosis of SARS-CoV-2 by FDA under an Emergency Use Authorization (EUA). This EUA will remain in effect (meaning this test can be used) for the duration of the COVID-19 declaration under Section 564(b)(1) of the Act, 21 U.S.C. section 360bbb-3(b)(1), unless the authorization is terminated or revoked.  Performed at [facility], [address] [place], [place] [address]   Body fluid culture w Gram Stain     Status: None   Collection Time: 12/27/20 11:41 AM   Specimen: Abdomen; Peritoneal Fluid  Result Value Ref Range Status   Specimen Description PERITONEAL FLUID  Final   Special Requests ABDOMEN  Final   Gram Stain   Final    MODERATE WBC PRESENT, PREDOMINANTLY PMN NO ORGANISMS SEEN    Culture   Final    FEW ESCHERICHIA COLI CRITICAL RESULT CALLED TO, READ BACK BY AND VERIFIED WITH: RN Dierdre Forth [id number] AT 944 AM BY CM Performed at [facility], [address], [place], [place] [address]    Report Status 12/30/2020 FINAL  Final   Organism ID, Bacteria ESCHERICHIA COLI  Final      Susceptibility   Escherichia coli - MIC*    AMPICILLIN <=2 SENSITIVE Sensitive     CEFAZOLIN <=4 SENSITIVE Sensitive     CEFEPIME <=0.12 SENSITIVE Sensitive     CEFTAZIDIME <=1 SENSITIVE Sensitive     CEFTRIAXONE <=0.25 SENSITIVE  Sensitive     CIPROFLOXACIN <=0.25 SENSITIVE Sensitive     GENTAMICIN <=1 SENSITIVE Sensitive     IMIPENEM <=0.25 SENSITIVE Sensitive     TRIMETH/SULFA <=20 SENSITIVE Sensitive     AMPICILLIN/SULBACTAM <=2 SENSITIVE Sensitive     PIP/TAZO <=4 SENSITIVE Sensitive     * FEW ESCHERICHIA COLI          Radiology Studies: No results found.      Scheduled Meds:  carvedilol  6.25 mg Oral BID WC   furosemide  20 mg Oral Daily   polyethylene glycol  17 g Oral Daily   spironolactone  50 mg Oral Daily   Continuous Infusions:  cefTRIAXone (ROCEPHIN)  IV 2 g (12/30/20 1452)     LOS: 4 days    Time spent 35 minutes.     Alba Cory, MD [facility]   If 7PM-7AM, please contact night-coverage [email or web address]  12/31/2020, 3:24 PM

## 2021-01-01 MED ORDER — SPIRONOLACTONE 50 MG PO TABS: 50.0000 mg | ORAL_TABLET | Freq: Every day | ORAL | 3 refills | Status: AC

## 2021-01-01 MED ORDER — CIPROFLOXACIN HCL 500 MG PO TABS: ORAL_TABLET | ORAL | 0 refills | Status: DC

## 2021-01-01 MED ORDER — CIPROFLOXACIN HCL 500 MG PO TABS: ORAL_TABLET | ORAL | 1 refills | Status: DC

## 2021-01-01 MED ORDER — FUROSEMIDE 20 MG PO TABS: 20.0000 mg | ORAL_TABLET | Freq: Every day | ORAL | 3 refills | Status: DC

## 2021-01-01 NOTE — Progress Notes (Signed)
Occupational Therapy Evaluation Patient Details Name: Jared Bass MRN: [id number] DOB: [birth year] Today's Date: 01/01/2021    History of Present Illness Pt is a 50 y/o male admitted secondary to abdominal pain and found to have spontaneous bacterial peritonitis. PMH including but not limited to alcohol abuse and alcoholic cirrhosis.   Clinical Impression   Pt presented with above diagnosis. Awake and alert, agreeable to OT evaluation. PTA pt PLOF living at home alone, dog rearing and Ind with ADLs and IADLs. During evaluation pt observed to be Ind with bed mobility, LB dressing, toilet transfer. No deficits observed to limit safe ADL engagement. No further acute OT needs required at this time. OT signing off. Thank you for the referral.     Follow Up Recommendations  No OT follow up    Equipment Recommendations       Recommendations for Other Services       Precautions / Restrictions Precautions Precautions: None Restrictions Weight Bearing Restrictions: No      Mobility Bed Mobility Overal bed mobility: Independent                  Transfers Overall transfer level: Independent                    Balance Overall balance assessment: Independent                               Standardized Balance Assessment Standardized Balance Assessment : Dynamic Gait Index   Dynamic Gait Index Level Surface: Normal Change in Gait Speed: Normal Gait with Horizontal Head Turns: Normal Gait with Vertical Head Turns: Normal Gait and Pivot Turn: Normal Step Over Obstacle: Normal Step Around Obstacles: Normal Steps: Normal Total Score: 24     ADL either performed or assessed with clinical judgement   ADL Overall ADL's : Independent                                             Vision Baseline Vision/History: Wears glasses;Macular Degeneration (MD in both eyes) Wears Glasses: At all times       Perception     Praxis       Pertinent Vitals/Pain Pain Assessment: No/denies pain     Hand Dominance Right   Extremity/Trunk Assessment Upper Extremity Assessment Upper Extremity Assessment: Overall WFL for tasks assessed   Lower Extremity Assessment Lower Extremity Assessment: Defer to PT evaluation   Cervical / Trunk Assessment Cervical / Trunk Assessment: Normal   Communication Communication Communication: No difficulties   Cognition Arousal/Alertness: Awake/alert Behavior During Therapy: WFL for tasks assessed/performed Overall Cognitive Status: Within Functional Limits for tasks assessed                                     General Comments       Exercises     Shoulder Instructions      Home Living Family/patient expects to be discharged to:: Private residence Living Arrangements: Alone Available Help at Discharge: Family;Available PRN/intermittently Type of Home: House Home Access: Level entry     Home Layout: One level     Bathroom Shower/Tub: Tub/shower unit         Home Equipment: None  Prior Functioning/Environment Level of Independence: Independent        Comments: enjoys gardening        OT Problem List:        OT Treatment/Interventions:      OT Goals(Current goals can be found in the care plan section) Acute Rehab OT Goals Patient Stated Goal: return home to care for his family and get back to gardening  OT Frequency:     Barriers to D/C:            Co-evaluation              AM-PAC OT "6 Clicks" Daily Activity     Outcome Measure Help from another person eating meals?: None Help from another person taking care of personal grooming?: None Help from another person toileting, which includes using toliet, bedpan, or urinal?: None Help from another person bathing (including washing, rinsing, drying)?: None Help from another person to put on and taking off regular upper body clothing?: None Help from another person to  put on and taking off regular lower body clothing?: None 6 Click Score: 24   End of Session Equipment Utilized During Treatment: Gait belt Nurse Communication: Mobility status  Activity Tolerance: Patient tolerated treatment well Patient left: in bed;with call bell/phone within reach                   Time: 0909-0917 OT Time Calculation (min): 8 min Charges:  OT General Charges $OT Visit: 1 Visit OT Evaluation $OT Eval Low Complexity: 1 Low  [place]quette Old, MSOT, OTR/L  Supplemental Rehabilitation Services  [phone]   Zigmund Daniel 01/01/2021, 11:28 AM

## 2021-01-01 NOTE — Evaluation (Signed)
Physical Therapy Evaluation Patient Details Name: Jared Bass MRN: [id number] DOB: 05-24-1971 Today's Date: 01/01/2021   History of Present Illness  Pt is a 50 y/o male admitted secondary to abdominal pain and found to have spontaneous bacterial peritonitis. PMH including but not limited to alcohol abuse and alcoholic cirrhosis.   Clinical Impression  Pt presented supine in bed with HOB elevated, awake and willing to participate in therapy session. Prior to admission, pt reported that he was independent with all functional mobility and ADLs. Pt lives alone in a single level home with a level entry. At the time of evaluation, pt overall at an independent level with all functional mobility and no balance deficits noted. 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 No further acute PT needs identified at this time. PT signing off.     Follow Up Recommendations No PT follow up    Equipment Recommendations  None recommended by PT    Recommendations for Other Services       Precautions / Restrictions Precautions Precautions: None Restrictions Weight Bearing Restrictions: No      Mobility  Bed Mobility Overal bed mobility: Independent                  Transfers Overall transfer level: Independent                  Ambulation/Gait Ambulation/Gait assistance: Independent Gait Distance (Feet): 500 Feet Assistive device: None   Gait velocity: able to fluctuate. WNL      Stairs            Wheelchair Mobility    Modified Rankin (Stroke Patients Only)       Balance Overall balance assessment: Independent                               Standardized Balance Assessment Standardized Balance Assessment : Dynamic Gait Index   Dynamic Gait Index Level Surface: Normal Change in Gait Speed: Normal Gait with Horizontal Head Turns: Normal Gait with Vertical  Head Turns: Normal Gait and Pivot Turn: Normal Step Over Obstacle: Normal Step Around Obstacles: Normal Steps: Normal Total Score: 24       Pertinent Vitals/Pain Pain Assessment: No/denies pain    Home Living Family/patient expects to be discharged to:: Private residence Living Arrangements: Alone Available Help at Discharge: Family;Available PRN/intermittently Type of Home: House Home Access: Level entry     Home Layout: One level Home Equipment: None      Prior Function Level of Independence: Independent         Comments: enjoys gardening     Hand Dominance   Dominant Hand: Right    Extremity/Trunk Assessment   Upper Extremity Assessment Upper Extremity Assessment: Defer to OT evaluation;Overall [facility] for tasks assessed    Lower Extremity Assessment Lower Extremity Assessment: Overall WFL for tasks assessed    Cervical / Trunk Assessment Cervical / Trunk Assessment: Normal  Communication   Communication: No difficulties  Cognition Arousal/Alertness: Awake/alert Behavior During Therapy: WFL for tasks assessed/performed Overall Cognitive Status: Within Functional Limits for tasks assessed                                        General Comments  Exercises     Assessment/Plan    PT Assessment Patent does not need any further PT services  PT Problem List         PT Treatment Interventions      PT Goals (Current goals can be found in the Care Plan section)  Acute Rehab PT Goals Patient Stated Goal: return home to care for his family and get back to gardening PT Goal Formulation: All assessment and education complete, DC therapy    Frequency     Barriers to discharge        Co-evaluation               AM-PAC PT "6 Clicks" Mobility  Outcome Measure Help needed turning from your back to your side while in a flat bed without using bedrails?: None Help needed moving from lying on your back to sitting on the side of  a flat bed without using bedrails?: None Help needed moving to and from a bed to a chair (including a wheelchair)?: None Help needed standing up from a chair using your arms (e.g., wheelchair or bedside chair)?: None Help needed to walk in hospital room?: None Help needed climbing 3-5 steps with a railing? : None 6 Click Score: 24    End of Session   Activity Tolerance: Patient tolerated treatment well Patient left: with call bell/phone within reach;Other (comment) (standing in room) Nurse Communication: Mobility status PT Visit Diagnosis: Pain Pain - part of body:  (abdominal)    Time: 9604-5409 PT Time Calculation (min) (ACUTE ONLY): 13 min   Charges:   PT Evaluation $PT Eval Low Complexity: 1 Low          Jared Bass, DPT  Acute Rehabilitation Services Office [phone]   Jared Bass 01/01/2021, 9:41 AM

## 2021-01-01 NOTE — Discharge Summary (Signed)
Physician Discharge Summary  Jared Bass:[id number] DOB: 04/19/1971 DOA: 12/27/2020  PCP: Armstead Peaks., MD  Admit date: 12/27/2020 Discharge date: 01/01/2021  Admitted From: Home  Disposition:  Home   Recommendations for Outpatient Follow-up:  Follow up with PCP in 1-2 weeks Please obtain BMP/CBC in one week Needs Bmet to follow electrolytes and renal function.  Home Health: none  Discharge Condition: Stable.  CODE STATUS: Full Code Diet recommendation: Heart Healthy   Brief/Interim Summary: 50 year old with past medical history significant for alcoholic cirrhosis, prior alcohol use quit drinking alcohol after hospitalization in June 2021, SBP, esophageal varices, A. fib not on anticoagulation, prior SBP presented to the ED complaining of abdominal pain, worsening abdominal distension,  nausea.   Patient underwent paracentesis by IR yielding 1.1 L on 6/28 the day of admission.  CT abdomen and pelvis showed findings consistent with hepatic cirrhosis, decreased diffuse colonic wall thickening extending from the ascending colon to the proximal sigmoid which may reflect portal colopathy or less likely infectious or inflammatory colitis.   Patient admitted with SBP and decompensated cirrhosis.    1-Decompensated Alcoholic Liver Cirrhosis complicated by SBP; -Underwent paracentesis yielding 1.1 L dark yellow fluid.  Ascites fluid showing white blood cell of 5975, neutrophil count 89%, cultures growing gram-negative rods -Ascites fluid growing E coli. Sensitive to ceftriaxone. Change antibiotics to ceftriaxone. Day 4 antibiotics. -GI consulted, started on Albumin. -Needs antibiotics for 10 days course and follow by prophylaxis with cipro . -Afebrile. LFT trending down.  -received IV  ceftriaxone. -Started on lasix and spironolactone.   -plan to discharge on cipro BID for 7 days then cipro daily long term for SBP prophylaxis.   2-Alcohol use disorder in remission   Paroxysmal A.  fib: Resume carvedilol Not on anticoagulation due to chronic thrombocytopenia and coagulopathy.   Hyponatremia; Related to cirrhosis. Monitor. Stable. Chronic pain; Continue with home dose dilaudid. Hypokalemia; replaced. Hypomagnesemia; Received IV magnesium. Mg normal.  Thrombocytopenia; related to cirrhosis.         Discharge Diagnoses:  Principal Problem:   SBP (spontaneous bacterial peritonitis) (HCC) Active Problems:   Jaundice   Thrombocytopenia (HCC)   Coagulopathy (HCC)   Unspecified atrial fibrillation (HCC)   Leukopenia   Alcoholic cirrhosis of liver with ascites (HCC)   Generalized abdominal pain    Discharge Instructions  Discharge Instructions     Diet - low sodium heart healthy   Complete by: As directed    Increase activity slowly   Complete by: As directed       Allergies as of 01/01/2021       Reactions   Tramadol Anaphylaxis, Rash   Other Other (See Comments)   Rembrant Whitening strips "fat lips"        Medication List     STOP taking these medications    gabapentin 300 MG capsule Commonly known as: NEURONTIN   promethazine 12.5 MG tablet Commonly known as: PHENERGAN       TAKE these medications    albuterol 108 (90 Base) MCG/ACT inhaler Commonly known as: VENTOLIN HFA INHALE 2 PUFFS INTO THE LUNGS EVERY 6 HOURS AS NEEDED (COUGH). What changed: See the new instructions.   carvedilol 6.25 MG tablet Commonly known as: COREG TAKE 1 TABLET BY MOUTH 2 TIMES DAILY WITH A MEAL. What changed: when to take this   ciprofloxacin 500 MG tablet Commonly known as: Cipro Take 1 tablet twice a day for 7 days, then take one table daily long term  furosemide 20 MG tablet Commonly known as: LASIX Take 1 tablet (20 mg total) by mouth daily. Start taking on: January 02, 2021 What changed: Another medication with the same name was removed. Continue taking this medication, and follow the directions you see here.   HYDROmorphone 4 MG  tablet Commonly known as: DILAUDID Take 4 mg by mouth 5 (five) times daily as needed for moderate pain or severe pain.   MULTI + OMEGA-3 ADULT GUMMIES PO Take 1 tablet by mouth daily.   polyethylene glycol 17 g packet Commonly known as: MIRALAX / GLYCOLAX Take 17 g by mouth daily. What changed:  when to take this reasons to take this   spironolactone 50 MG tablet Commonly known as: ALDACTONE Take 1 tablet (50 mg total) by mouth daily.   Vitamin D (Ergocalciferol) 1.25 MG (50000 UNIT) Caps capsule Commonly known as: DRISDOL Take 50,000 Units by mouth every Monday.        Allergies  Allergen Reactions   Tramadol Anaphylaxis and Rash   Other Other (See Comments)    Rembrant Whitening strips  "fat lips"    Consultations: GI   Procedures/Studies: CT ABDOMEN PELVIS W CONTRAST  Result Date: 12/27/2020 CLINICAL DATA:  Left upper quadrant abdominal pain. EXAM: CT ABDOMEN AND PELVIS WITH CONTRAST TECHNIQUE: Multidetector CT imaging of the abdomen and pelvis was performed using the standard protocol following bolus administration of intravenous contrast. CONTRAST:  OMNIPAQUE IOHEXOL 300 MG/ML  SOLN COMPARISON:  August 08, 2020 FINDINGS: Lower chest: Hypoventilatory change in the dependent lungs. Hepatobiliary: Cirrhotic hepatic morphology. Stable scattered tiny hypodensities in the liver, likely cysts. Gallbladder is dilated similar to prior examination measuring 6.5 cm in maximum diameter. There is mild intra and extrahepatic biliary ductal dilation with the common duct measuring up to 11-12 mm, similar multiple priors and and previously evaluated with MRI abdomen on January 27, 2020 sequela of patient's dilated gallbladder. Pancreas: No pancreatic ductal dilation. No evidence of acute pancreatic inflammation. Spleen: No splenomegaly.  Benign splenic cleft. Adrenals/Urinary Tract: Adrenal glands are unremarkable. Kidneys are normal, without renal calculi, solid enhancing lesion,  or hydronephrosis. Bladder is unremarkable for degree of distension. Stomach/Bowel: Stomach is grossly unremarkable. No pathologic dilation of small bowel. The appendix appears unremarkable. Mild diffuse colonic wall thickening extending from the ascending colon to the proximal sigmoid colon. Vascular/Lymphatic: No abdominal aortic aneurysm. Recannulized periumbilical vein. Gastric, esophageal, and perisplenic varices. The portal, splenic and superior mesenteric veins are patent. Prominent upper abdominal lymph nodes, similar to prior, not pathologically enlarged by size criteria and favored reactive. No pathologically enlarged abdominal or pelvic lymph nodes. Reproductive: Prostate is unremarkable. Other: Small volume abdominopelvic ascites. No pneumoperitoneum. No portal venous gas. No pneumatosis. Anasarca. Musculoskeletal: No acute or significant osseous abnormality. Right gynecomastia partially visualized. IMPRESSION: 1. Cirrhotic hepatic morphology with evidence of portal hypertension including abdominal collateralized vasculature, small volume abdominopelvic ascites and anasarca. 2. Slightly decreased diffuse colonic wall thickening extending from the ascending colon to the proximal sigmoid colon, which may reflect portal colopathy or less likely other infectious or inflammatory colitis. 3. Dilated gallbladder and mild intra and extrahepatic biliary ductal dilation, similar to prior examination and likely sequela of patient's hepatocellular disease. However acalculous cholecystitis not excluded, consider further evaluation with nuclear medicine HIDA scan. Electronically Signed   By: Maudry Mayhew MD   On: 12/27/2020 14:42   DG Chest Port 1 View  Result Date: 12/27/2020 CLINICAL DATA:  Shortness of breath. EXAM: PORTABLE CHEST 1 VIEW COMPARISON:  Chest x-ray 01/27/2020. FINDINGS: Mediastinum and hilar structures normal. Cardiomegaly. No pulmonary venous congestion. Low lung volumes with mild bibasilar  atelectasis. No focal infiltrate. No pleural effusion or pneumothorax. Prior cervical spine fusion. IMPRESSION: 1.  Cardiomegaly.  No pulmonary venous congestion. 2.  Low lung volumes with mild bibasilar atelectasis. Electronically Signed   By: Maisie Fus  Register   On: 12/27/2020 08:41   IR Paracentesis  Result Date: 12/27/2020 INDICATION: Patient with a history of alcoholic cirrhosis with ascites presents today for a diagnostic and therapeutic paracentesis. EXAM: ULTRASOUND GUIDED PARACENTESIS MEDICATIONS: 1% lidocaine 10 mL COMPLICATIONS: None immediate. PROCEDURE: Informed written consent was obtained from the patient after a discussion of the risks, benefits and alternatives to treatment. A timeout was performed prior to the initiation of the procedure. Initial ultrasound scanning demonstrates a large amount of ascites within the right lower abdominal quadrant. The right lower abdomen was prepped and draped in the usual sterile fashion. 1% lidocaine was used for local anesthesia. Following this, a 6 Fr Safe-T-Centesis catheter was introduced. An ultrasound image was saved for documentation purposes. The paracentesis was performed. The catheter was removed and a dressing was applied. The patient tolerated the procedure well without immediate post procedural complication. FINDINGS: A total of approximately 1.1 L of dark yellow fluid was removed. Samples were sent to the laboratory as requested by the clinical team. IMPRESSION: Successful ultrasound-guided paracentesis yielding 1.1 liters of peritoneal fluid. Read by: Alwyn Ren, NP Electronically Signed   By: Marliss Coots MD   On: 12/27/2020 11:50     Subjective: He is feeling better, ready for discharge   Discharge Exam: Vitals:   12/31/20 2355 01/01/21 0514  BP: 117/72 119/75  Pulse: 88 92  Resp: 18 19  Temp: 99.5 F (37.5 C) 98.2 F (36.8 C)  SpO2: 98% 98%     General: Pt is alert, awake, not in acute distress Cardiovascular: RRR,  S1/S2 +, no rubs, no gallops Respiratory: CTA bilaterally, no wheezing, no rhonchi Abdominal: Soft, NT, ND, bowel sounds + Extremities: no edema, no cyanosis    The results of significant diagnostics from this hospitalization (including imaging, microbiology, ancillary and laboratory) are listed below for reference.     Microbiology: Recent Results (from the past 240 hour(s))  Resp Panel by RT-PCR (Flu A&B, Covid) Nasopharyngeal Swab     Status: None   Collection Time: 12/27/20  8:15 AM   Specimen: Nasopharyngeal Swab; Nasopharyngeal(NP) swabs in vial transport medium  Result Value Ref Range Status   SARS Coronavirus 2 by RT PCR NEGATIVE NEGATIVE Final    Comment: (NOTE) SARS-CoV-2 target nucleic acids are NOT DETECTED.  The SARS-CoV-2 RNA is generally detectable in upper respiratory specimens during the acute phase of infection. The lowest concentration of SARS-CoV-2 viral copies this assay can detect is 138 copies/mL. A negative result does not preclude SARS-Cov-2 infection and should not be used as the sole basis for treatment or other patient management decisions. A negative result may occur with  improper specimen collection/handling, submission of specimen other than nasopharyngeal swab, presence of viral mutation(s) within the areas targeted by this assay, and inadequate number of viral copies(<138 copies/mL). A negative result must be combined with clinical observations, patient history, and epidemiological information. The expected result is Negative.  Fact Sheet for Patients:  BloggerCourse.com  Fact Sheet for Healthcare Providers:  SeriousBroker.it  This test is no t yet approved or cleared by the [place] FDA and  has been authorized for detection and/or diagnosis  of SARS-CoV-2 by FDA under an Emergency Use Authorization (EUA). This EUA will remain  in effect (meaning this test can be used) for the duration of  the COVID-19 declaration under Section 564(b)(1) of the Act, 21 U.S.C.section 360bbb-3(b)(1), unless the authorization is terminated  or revoked sooner.       Influenza A by PCR NEGATIVE NEGATIVE Final   Influenza B by PCR NEGATIVE NEGATIVE Final    Comment: (NOTE) The Xpert Xpress SARS-CoV-2/FLU/RSV plus assay is intended as an aid in the diagnosis of influenza from Nasopharyngeal swab specimens and should not be used as a sole basis for treatment. Nasal washings and aspirates are unacceptable for Xpert Xpress SARS-CoV-2/FLU/RSV testing.  Fact Sheet for Patients: BloggerCourse.com  Fact Sheet for Healthcare Providers: SeriousBroker.it  This test is not yet approved or cleared by the [place] FDA and has been authorized for detection and/or diagnosis of SARS-CoV-2 by FDA under an Emergency Use Authorization (EUA). This EUA will remain in effect (meaning this test can be used) for the duration of the COVID-19 declaration under Section 564(b)(1) of the Act, 21 U.S.C. section 360bbb-3(b)(1), unless the authorization is terminated or revoked.  Performed at [facility], [address], [place], [place] [address]   Body fluid culture w Gram Stain     Status: None   Collection Time: 12/27/20 11:41 AM   Specimen: Abdomen; Peritoneal Fluid  Result Value Ref Range Status   Specimen Description PERITONEAL FLUID  Final   Special Requests ABDOMEN  Final   Gram Stain   Final    MODERATE WBC PRESENT, PREDOMINANTLY PMN NO ORGANISMS SEEN    Culture   Final    FEW ESCHERICHIA COLI CRITICAL RESULT CALLED TO, READ BACK BY AND VERIFIED WITH: RN Dierdre Forth [id number] AT 944 AM BY CM Performed at [facility], [address], [place], [place] [address]    Report Status 12/30/2020 FINAL  Final   Organism ID, Bacteria ESCHERICHIA COLI  Final      Susceptibility   Escherichia coli - MIC*    AMPICILLIN <=2 SENSITIVE Sensitive      CEFAZOLIN <=4 SENSITIVE Sensitive     CEFEPIME <=0.12 SENSITIVE Sensitive     CEFTAZIDIME <=1 SENSITIVE Sensitive     CEFTRIAXONE <=0.25 SENSITIVE Sensitive     CIPROFLOXACIN <=0.25 SENSITIVE Sensitive     GENTAMICIN <=1 SENSITIVE Sensitive     IMIPENEM <=0.25 SENSITIVE Sensitive     TRIMETH/SULFA <=20 SENSITIVE Sensitive     AMPICILLIN/SULBACTAM <=2 SENSITIVE Sensitive     PIP/TAZO <=4 SENSITIVE Sensitive     * FEW ESCHERICHIA COLI     Labs: BNP (last 3 results) Recent Labs    01/27/20 1320  BNP 55.1   Basic Metabolic Panel: Recent Labs  Lab 12/27/20 0810 12/28/20 0349 12/29/20 0300 12/30/20 0333 12/31/20 0049 12/31/20 1856  NA 137 133* 129* 129* 130*  --   K 3.5 3.9 3.4* 3.9 4.0  --   CL 98 99 96* 99 98  --   CO2 31 26 28 24 27   --   GLUCOSE 135* 139* 142* 137* 137*  --   BUN 5* 11 13 12 13   --   CREATININE 0.89 1.06 0.99 1.08 1.02  --   CALCIUM 8.8* 8.7* 8.2* 7.9* 8.2*  --   MG  --   --  1.6*  --   --  2.0   Liver Function Tests: Recent Labs  Lab 12/27/20 0810 12/28/20 0349 12/29/20 0300  12/30/20 0333 12/31/20 0049  AST 51* 43* 37 37 31  ALT 25 22 22 20 21   ALKPHOS 141* 135* 115 104 117  BILITOT 10.8* 13.5* 12.1* 9.5* 8.8*  PROT 5.8* 5.5* 5.2* 4.7* 5.0*  ALBUMIN 2.6* 2.3* 2.4* 2.1* 2.2*   Recent Labs  Lab 12/27/20 0810  LIPASE 36   No results for input(s): AMMONIA in the last 168 hours. CBC: Recent Labs  Lab 12/27/20 0810 12/28/20 0349 12/29/20 0300 12/30/20 0333 12/31/20 0049  WBC 3.4* 26.5* 12.5* 15.5* 12.5*  NEUTROABS 1.4*  --   --   --   --   HGB 13.1 12.5* 10.9* 10.1* 10.7*  HCT 37.8* 35.4* 31.1* 28.4* 30.3*  MCV 102.4* 100.6* 100.3* 99.0 99.0  PLT 95* 85* 61* 51* 61*   Cardiac Enzymes: No results for input(s): CKTOTAL, CKMB, CKMBINDEX, TROPONINI in the last 168 hours. BNP: Invalid input(s): POCBNP CBG: Recent Labs  Lab 12/28/20 0612 12/28/20 1107 12/28/20 1600 12/28/20 2109 12/29/20 0603  GLUCAP 115* 130* 179* 158* 122*    D-Dimer No results for input(s): DDIMER in the last 72 hours. Hgb A1c No results for input(s): HGBA1C in the last 72 hours. Lipid Profile No results for input(s): CHOL, HDL, LDLCALC, TRIG, CHOLHDL, LDLDIRECT in the last 72 hours. Thyroid function studies No results for input(s): TSH, T4TOTAL, T3FREE, THYROIDAB in the last 72 hours.  Invalid input(s): FREET3 Anemia work up No results for input(s): VITAMINB12, FOLATE, FERRITIN, TIBC, IRON, RETICCTPCT in the last 72 hours. Urinalysis    Component Value Date/Time   COLORURINE AMBER (A) 12/27/2020 1202   APPEARANCEUR HAZY (A) 12/27/2020 1202   LABSPEC 1.017 12/27/2020 1202   PHURINE 5.0 12/27/2020 1202   GLUCOSEU NEGATIVE 12/27/2020 1202   HGBUR NEGATIVE 12/27/2020 1202   BILIRUBINUR NEGATIVE 12/27/2020 1202   KETONESUR NEGATIVE 12/27/2020 1202   PROTEINUR NEGATIVE 12/27/2020 1202   UROBILINOGEN 1.0 02/01/2009 1447   NITRITE NEGATIVE 12/27/2020 1202   LEUKOCYTESUR NEGATIVE 12/27/2020 1202   Sepsis Labs Invalid input(s): PROCALCITONIN,  WBC,  LACTICIDVEN Microbiology Recent Results (from the past 240 hour(s))  Resp Panel by RT-PCR (Flu A&B, Covid) Nasopharyngeal Swab     Status: None   Collection Time: 12/27/20  8:15 AM   Specimen: Nasopharyngeal Swab; Nasopharyngeal(NP) swabs in vial transport medium  Result Value Ref Range Status   SARS Coronavirus 2 by RT PCR NEGATIVE NEGATIVE Final    Comment: (NOTE) SARS-CoV-2 target nucleic acids are NOT DETECTED.  The SARS-CoV-2 RNA is generally detectable in upper respiratory specimens during the acute phase of infection. The lowest concentration of SARS-CoV-2 viral copies this assay can detect is 138 copies/mL. A negative result does not preclude SARS-Cov-2 infection and should not be used as the sole basis for treatment or other patient management decisions. A negative result may occur with  improper specimen collection/handling, submission of specimen other than nasopharyngeal  swab, presence of viral mutation(s) within the areas targeted by this assay, and inadequate number of viral copies(<138 copies/mL). A negative result must be combined with clinical observations, patient history, and epidemiological information. The expected result is Negative.  Fact Sheet for Patients:  BloggerCourse.comhttps://www.fda.gov/media/152166/download  Fact Sheet for Healthcare Providers:  SeriousBroker.ithttps://www.fda.gov/media/152162/download  This test is no t yet approved or cleared by the [place] FDA and  has been authorized for detection and/or diagnosis of SARS-CoV-2 by FDA under an Emergency Use Authorization (EUA). This EUA will remain  in effect (meaning this test can be used) for the duration of the COVID-19  declaration under Section 564(b)(1) of the Act, 21 U.S.C.section 360bbb-3(b)(1), unless the authorization is terminated  or revoked sooner.       Influenza A by PCR NEGATIVE NEGATIVE Final   Influenza B by PCR NEGATIVE NEGATIVE Final    Comment: (NOTE) The Xpert Xpress SARS-CoV-2/FLU/RSV plus assay is intended as an aid in the diagnosis of influenza from Nasopharyngeal swab specimens and should not be used as a sole basis for treatment. Nasal washings and aspirates are unacceptable for Xpert Xpress SARS-CoV-2/FLU/RSV testing.  Fact Sheet for Patients: BloggerCourse.com  Fact Sheet for Healthcare Providers: SeriousBroker.it  This test is not yet approved or cleared by the [place] FDA and has been authorized for detection and/or diagnosis of SARS-CoV-2 by FDA under an Emergency Use Authorization (EUA). This EUA will remain in effect (meaning this test can be used) for the duration of the COVID-19 declaration under Section 564(b)(1) of the Act, 21 U.S.C. section 360bbb-3(b)(1), unless the authorization is terminated or revoked.  Performed at [facility], [address] [place], [place] [address]   Body fluid  culture w Gram Stain     Status: None   Collection Time: 12/27/20 11:41 AM   Specimen: Abdomen; Peritoneal Fluid  Result Value Ref Range Status   Specimen Description PERITONEAL FLUID  Final   Special Requests ABDOMEN  Final   Gram Stain   Final    MODERATE WBC PRESENT, PREDOMINANTLY PMN NO ORGANISMS SEEN    Culture   Final    FEW ESCHERICHIA COLI CRITICAL RESULT CALLED TO, READ BACK BY AND VERIFIED WITH: RN Dierdre Forth [id number] AT 944 AM BY CM Performed at [facility], [address], [place], [place] [address]    Report Status 12/30/2020 FINAL  Final   Organism ID, Bacteria ESCHERICHIA COLI  Final      Susceptibility   Escherichia coli - MIC*    AMPICILLIN <=2 SENSITIVE Sensitive     CEFAZOLIN <=4 SENSITIVE Sensitive     CEFEPIME <=0.12 SENSITIVE Sensitive     CEFTAZIDIME <=1 SENSITIVE Sensitive     CEFTRIAXONE <=0.25 SENSITIVE Sensitive     CIPROFLOXACIN <=0.25 SENSITIVE Sensitive     GENTAMICIN <=1 SENSITIVE Sensitive     IMIPENEM <=0.25 SENSITIVE Sensitive     TRIMETH/SULFA <=20 SENSITIVE Sensitive     AMPICILLIN/SULBACTAM <=2 SENSITIVE Sensitive     PIP/TAZO <=4 SENSITIVE Sensitive     * FEW ESCHERICHIA COLI     Time coordinating discharge: 40 minutes  SIGNED:   Alba Cory, MD  [facility]

## 2021-11-07 ENCOUNTER — Telehealth: Payer: Self-pay | Admitting: Gastroenterology

## 2021-11-07 NOTE — Telephone Encounter (Signed)
Good morning Dr. Barron Alvine, ? ?We received a call from patient stating that his doctor sent over a referral for colonoscopy. Per patient, spoke to Dr. Eudelia Bunch [facility] liver Doctor) and he advised both an EGD/Colonoscopy be scheduled. What do you advise? Last EGD at [facility] 01/29/2020. I will call patient to schedule once I hear from you. Thank you.  ?

## 2021-11-08 NOTE — Telephone Encounter (Signed)
error 

## 2021-11-08 NOTE — Telephone Encounter (Signed)
Patient follows at [facility].  Notes from his most recent appointment with Dr. Eudelia Bunch reviewed.  Needs EGD and colonoscopy as part of his pre-transplant evaluation.  Please schedule OV with me to discuss and I will help expedite scheduling EGD/colonoscopy.  ?

## 2021-11-10 ENCOUNTER — Encounter: Payer: Self-pay | Admitting: Gastroenterology

## 2021-11-10 NOTE — Telephone Encounter (Signed)
Patient has been scheduled for an OV 12/14/21. ?

## 2021-12-14 ENCOUNTER — Ambulatory Visit (INDEPENDENT_AMBULATORY_CARE_PROVIDER_SITE_OTHER): Payer: Medicare Other | Admitting: Gastroenterology

## 2021-12-14 ENCOUNTER — Encounter: Payer: Self-pay | Admitting: Gastroenterology

## 2021-12-14 VITALS — BP 124/76 | HR 106 | Ht 68.0 in | Wt 199.0 lb

## 2021-12-14 DIAGNOSIS — K766 Portal hypertension: Secondary | ICD-10-CM | POA: Diagnosis not present

## 2021-12-14 DIAGNOSIS — K7031 Alcoholic cirrhosis of liver with ascites: Secondary | ICD-10-CM

## 2021-12-14 DIAGNOSIS — Z1212 Encounter for screening for malignant neoplasm of rectum: Secondary | ICD-10-CM

## 2021-12-14 DIAGNOSIS — I8510 Secondary esophageal varices without bleeding: Secondary | ICD-10-CM

## 2021-12-14 DIAGNOSIS — D696 Thrombocytopenia, unspecified: Secondary | ICD-10-CM

## 2021-12-14 DIAGNOSIS — E871 Hypo-osmolality and hyponatremia: Secondary | ICD-10-CM

## 2021-12-14 DIAGNOSIS — K3189 Other diseases of stomach and duodenum: Secondary | ICD-10-CM

## 2021-12-14 DIAGNOSIS — Z1211 Encounter for screening for malignant neoplasm of colon: Secondary | ICD-10-CM | POA: Diagnosis not present

## 2021-12-14 DIAGNOSIS — D689 Coagulation defect, unspecified: Secondary | ICD-10-CM

## 2021-12-14 MED ORDER — SUTAB 1479-225-188 MG PO TABS: 1.0000 | ORAL_TABLET | Freq: Once | ORAL | 0 refills | Status: AC

## 2021-12-14 NOTE — Patient Instructions (Signed)
We have sent the following medications to your pharmacy for you to pick up at your convenience: Sutab  Please purchase the following medications over the counter and take as directed: Dulcolax  5 mg, 8 tablets  You have been scheduled for an endoscopy and colonoscopy. Please follow the written instructions given to you at your visit today. Please pick up your prep supplies at the pharmacy within the next 1-3 days. If you use inhalers (even only as needed), please bring them with you on the day of your procedure.   If you are age 24 or younger, your body mass index should be between 19-25. Your Body mass index is 30.26 kg/m. If this is out of the aformentioned range listed, please consider follow up with your Primary Care Provider.   ________________________________________________________  The [facility] providers would like to encourage you to use [facility] to communicate with providers for non-urgent requests or questions.  Due to long hold times on the telephone, sending your provider a message by [facility] may be a faster and more efficient way to get a response.  Please allow 48 business hours for a response.  Please remember that this is for non-urgent requests.  _______________________________________________________  Thank you for choosing me and [facility].  Vito Cirigliano, D.O.

## 2021-12-14 NOTE — Progress Notes (Signed)
Chief Complaint:    Discuss EGD and colonoscopy, Cirrhosis   HPI:     Patient is a 51 y.o. male with a history of EtOH cirrhosis as below, atrial fibrillation with history of saddle PE (not taking any anticoagulation), chronic neck pain, referred to the Gastroenterology Clinic to discuss EGD and Colonoscopy as part of his pre-transplant evaluation.   He has been following with [facility] for EtOH cirrhosis c/b EtOH hepatitis in 01/2020, portal hypertension, esophageal varices, ascites, hyponatremia, and recurrent SBP.  Quit drinking in 12/2019.  Last paracentesis was 11/2020 (1.1 L removed). Was last seen at [facility] on 10/26/2021.  Discussing possible living donor transplant (daughter). - MELD: 29 - CTP: C  He is referred here today to schedule EGD and colonoscopy as part of his pretransplant evaluation.  No previous colonoscopy.  His last upper endoscopy was 12/2019 as an inpatient as outlined below.  No longer taking carvedilol for unclear reasons.  He is taking ciprofloxacin for SBP prophylaxis.  Takes Lasix/Aldactone as prescribed.  Reviewed most recent labs from [facility] n/f PLT 55, H/H 12.9/36.7 with MCV 103, NA 131, K4.2, BUN/creatinine 12/0.85, albumin 2.9, T. bili 12, AST/ALT 101/53, ALP 186, PT/INR 28/2.4, AFP negative  MRI abdomen on 08/30/2021: Cirrhotic liver with sequela of portal hypertension including moderate caliber upper abdominal/paraesophageal varices with evidence of splenorenal shunt, as well as moderate abdominal pelvic ascites and small left pleural effusion.  Limited opacification of the right portal vein with mildly prominent adjacent collateral vasculature which may represent chronic thrombosis.  Circumferential gallbladder wall edema without evidence of hyperemia favored to be secondary to chronic volume overload/chronic liver disease.  Hepatic cysts, no HCC.  Mild edema of the cecum 2/2 portal colopathy.  No previous colonoscopy.   No known  family history of CRC, GI malignancy, liver disease, pancreatic disease, or IBD.    Endoscopic History: - EGD (12/2019): Grade 1-2 esophageal varices, moderate portal hypertensive gastropathy.  Started on nadolol    Review of systems:     No chest pain, no SOB, no fevers, no urinary sx   Past Medical History:  Diagnosis Date   Acute renal failure (HCC)    Acute respiratory failure (HCC)    2017   Acute respiratory failure with hypoxia (HCC)    Acute saddle pulmonary embolism with acute cor pulmonale (HCC)    Chronic neck pain    Closed fracture of nasal bone    DDD (degenerative disc disease), cervical 05/04/2015   Faintness    Hemoptysis    History of atrial fibrillation    History of head injury 05/04/2015   History of tachycardia 05/04/2015   Hypertension    Hypotension    Pulmonary emboli (HCC)    on elequis   Pulmonary embolism (HCC)    Right eye injury    Saddle pulmonary embolus (HCC) 12/13/2015   Syncope and collapse    Tachycardia 12/20/2015    Patient's surgical history, family medical history, social history, medications and allergies were all reviewed in Epic    Current Outpatient Medications  Medication Sig Dispense Refill   albuterol (VENTOLIN HFA) 108 (90 Base) MCG/ACT inhaler INHALE 2 PUFFS INTO THE LUNGS EVERY 6 HOURS AS NEEDED (COUGH). 18 g 1   ciprofloxacin (CIPRO) 500 MG tablet Take 1 tablet twice a day for 7 days, then take one table daily long term 60 tablet 1   furosemide (LASIX) 20 MG tablet Take 1 tablet (20 mg total) by mouth daily.  30 tablet 3   Multiple Vitamins-Minerals (MULTI + OMEGA-3 ADULT GUMMIES PO) Take 1 tablet by mouth daily.     polyethylene glycol (MIRALAX / GLYCOLAX) packet Take 17 g by mouth daily. 28 packet 2   spironolactone (ALDACTONE) 50 MG tablet Take 1 tablet (50 mg total) by mouth daily. 30 tablet 3   No current facility-administered medications for this visit.    Physical Exam:     BP 124/76   Pulse (!) 106   Ht 5\' 8"   (1.727 m)   Wt 199 lb (90.3 kg)   SpO2 95%   BMI 30.26 kg/m   GENERAL:  Pleasant male in NAD PSYCH: : Cooperative, normal affect CARDIAC:  RRR, no murmur heard, no peripheral edema PULM: Normal respiratory effort, lungs CTA bilaterally, no wheezing ABDOMEN: Distended with positive fluid wave.  Abdomen otherwise soft and nontender.  Normal bowel sounds NEURO: Alert and oriented x 3, no focal neurologic deficits   IMPRESSION and PLAN:    1) EtOH cirrhosis 2) Esophageal varices 3) Portal hypertensive gastropathy 4) Portal colopathy on imaging 5) Hyponatremia 6) Coagulopathy 7) Thrombocytopenia 8) Ascites with history of SBP (on Cipro for SBP prophylaxis)  - Plan for EGD for variceal surveillance - Will likely need to restart carvedilol pending repeat EGD findings.  Not sure why or when he stopped.  HR in clinic today would be supportive of restarting - Colonoscopy for routine CRC screening and part of pretransplant work-up - Continue close follow-up with [facility] - Continue diuretics as prescribed - Continue low-sodium diet - Continue ciprofloxacin as prescribed for SBP prophylaxis - Continue multivitamin  9) Colon cancer screening - No previous CRC screening - Scheduling colonoscopy as outlined above - Sutab + Dulcolax   The indications, risks, and benefits of EGD and colonoscopy were explained to the patient in detail. Risks include but are not limited to bleeding, perforation, adverse reaction to medications, and cardiopulmonary compromise. Sequelae include but are not limited to the possibility of surgery, hospitalization, and mortality. The patient verbalized understanding and wished to proceed. All questions answered, referred to scheduler and bowel prep ordered. Further recommendations pending results of the exam.              [facility] Michaiah Holsopple ,DO, FACG 12/14/2021, 10:50 AM

## 2022-01-03 ENCOUNTER — Ambulatory Visit (AMBULATORY_SURGERY_CENTER): Payer: Medicare Other | Admitting: Gastroenterology

## 2022-01-03 ENCOUNTER — Encounter: Payer: Self-pay | Admitting: Gastroenterology

## 2022-01-03 VITALS — BP 119/77 | HR 91 | Temp 98.2°F | Resp 19 | Ht 68.0 in | Wt 199.0 lb

## 2022-01-03 DIAGNOSIS — K3189 Other diseases of stomach and duodenum: Secondary | ICD-10-CM

## 2022-01-03 DIAGNOSIS — K7031 Alcoholic cirrhosis of liver with ascites: Secondary | ICD-10-CM

## 2022-01-03 DIAGNOSIS — K2970 Gastritis, unspecified, without bleeding: Secondary | ICD-10-CM

## 2022-01-03 DIAGNOSIS — K640 First degree hemorrhoids: Secondary | ICD-10-CM | POA: Diagnosis not present

## 2022-01-03 DIAGNOSIS — K766 Portal hypertension: Secondary | ICD-10-CM | POA: Diagnosis not present

## 2022-01-03 DIAGNOSIS — Z1211 Encounter for screening for malignant neoplasm of colon: Secondary | ICD-10-CM | POA: Diagnosis not present

## 2022-01-03 DIAGNOSIS — K6289 Other specified diseases of anus and rectum: Secondary | ICD-10-CM | POA: Diagnosis not present

## 2022-01-03 DIAGNOSIS — Z538 Procedure and treatment not carried out for other reasons: Secondary | ICD-10-CM

## 2022-01-03 DIAGNOSIS — K2951 Unspecified chronic gastritis with bleeding: Secondary | ICD-10-CM | POA: Diagnosis not present

## 2022-01-03 DIAGNOSIS — I8510 Secondary esophageal varices without bleeding: Secondary | ICD-10-CM

## 2022-01-03 MED ORDER — SODIUM CHLORIDE 0.9 % IV SOLN: 500.0000 mL | Freq: Once | INTRAVENOUS | Status: DC

## 2022-01-03 MED ORDER — NA SULFATE-K SULFATE-MG SULF 17.5-3.13-1.6 GM/177ML PO SOLN: 1.0000 [IU] | Freq: Once | ORAL | 0 refills | Status: AC

## 2022-01-03 MED ORDER — CARVEDILOL 6.25 MG PO TABS: 6.2500 mg | ORAL_TABLET | Freq: Two times a day (BID) | ORAL | 3 refills | Status: DC

## 2022-01-03 NOTE — Patient Instructions (Signed)
Upper-Resume previous diet and medications. Awaiting pathology results. Start Carvedilol 6.25 mg twice daily for treatment of varices and titrate to heart rate < 60. Repeat upper endoscopy in 2 years for surveillance. Follow up in the [facility] as previously scheduled.  Lower-Repeat Colonoscopy at next available appointment within 3 months because the bowel preperation was suboptimal. Plan for extended 2 day bowel preparation with Suprep and Dulcolax for repeat Colonoscopy.  YOU HAD AN ENDOSCOPIC PROCEDURE TODAY AT THE [facility]:   Refer to the procedure report that was given to you for any specific questions about what was found during the examination.  If the procedure report does not answer your questions, please call your gastroenterologist to clarify.  If you requested that your care partner not be given the details of your procedure findings, then the procedure report has been included in a sealed envelope for you to review at your convenience later.  YOU SHOULD EXPECT: Some feelings of bloating in the abdomen. Passage of more gas than usual.  Walking can help get rid of the air that was put into your GI tract during the procedure and reduce the bloating. If you had a lower endoscopy (such as a colonoscopy or flexible sigmoidoscopy) you may notice spotting of blood in your stool or on the toilet paper. If you underwent a bowel prep for your procedure, you may not have a normal bowel movement for a few days.  Please Note:  You might notice some irritation and congestion in your nose or some drainage.  This is from the oxygen used during your procedure.  There is no need for concern and it should clear up in a day or so.  SYMPTOMS TO REPORT IMMEDIATELY:  Following upper endoscopy (EGD)  Vomiting of blood or coffee ground material  New chest pain or pain under the shoulder blades  Painful or persistently difficult swallowing  New shortness of breath  Fever  of 100F or higher  Black, tarry-looking stools  For urgent or emergent issues, a gastroenterologist can be reached at any hour by calling [phone]. Do not use MyChart messaging for urgent concerns.    DIET:  We do recommend a small meal at first, but then you may proceed to your regular diet.  Drink plenty of fluids but you should avoid alcoholic beverages for 24 hours.  ACTIVITY:  You should plan to take it easy for the rest of today and you should NOT DRIVE or use heavy machinery until tomorrow (because of the sedation medicines used during the test).    FOLLOW UP: Our staff will call the number listed on your records the next business day following your procedure.  We will call around 7:15- 8:00 am to check on you and address any questions or concerns that you may have regarding the information given to you following your procedure. If we do not reach you, we will leave a message.  If you develop any symptoms (ie: fever, flu-like symptoms, shortness of breath, cough etc.) before then, please call [phone].  If you test positive for Covid 19 in the 2 weeks post procedure, please call and report this information to [place].    If any biopsies were taken you will be contacted by phone or by letter within the next 1-3 weeks.  Please call us at [phone] if you have not heard about the biopsies in 3 weeks.    SIGNATURES/CONFIDENTIALITY: You and/or your care partner have signed paperwork which will  be entered into your electronic medical record.  These signatures attest to the fact that that the information above on your After Visit Summary has been reviewed and is understood.  Full responsibility of the confidentiality of this discharge information lies with you and/or your care-partner.

## 2022-01-03 NOTE — Progress Notes (Signed)
Called to room to assist during endoscopic procedure.  Patient ID and intended procedure confirmed with present staff. Received instructions for my participation in the procedure from the performing physician.  

## 2022-01-03 NOTE — Progress Notes (Signed)
GASTROENTEROLOGY PROCEDURE H&P NOTE   Primary Care Physician: Armstead Peaks., MD    Reason for Procedure:  Esophageal varices surveillance, colon cancer screening  Plan:    EGD, colonoscopy  Patient is appropriate for endoscopic procedure(s) in the ambulatory ([facility]) setting.  The nature of the procedure, as well as the risks, benefits, and alternatives were carefully and thoroughly reviewed with the patient. Ample time for discussion and questions allowed. The patient understood, was satisfied, and agreed to proceed.     HPI: Jared Bass is a 51 y.o. male who presents for EGD for continued esophageal varices surveillance along with colonoscopy for routine CRC screening and as part of pretransplant work-up.   No previous colonoscopy.  Patient was most recently seen in the Gastroenterology Clinic on 12/14/2021 by me.  No interval change in medical history since that appointment. Please refer to that note for full details regarding GI history and clinical presentation.   Past Medical History:  Diagnosis Date   Acute renal failure (HCC)    Acute respiratory failure (HCC)    2017   Acute respiratory failure with hypoxia (HCC)    Acute saddle pulmonary embolism with acute cor pulmonale (HCC)    Cataract    Chronic neck pain    Closed fracture of nasal bone    DDD (degenerative disc disease), cervical 05/04/2015   Faintness    Hemoptysis    History of atrial fibrillation    History of head injury 05/04/2015   History of tachycardia 05/04/2015   Hypertension    Hypotension    Pulmonary emboli (HCC)    on elequis   Pulmonary embolism (HCC)    Right eye injury    Saddle pulmonary embolus (HCC) 12/13/2015   Substance abuse (HCC)    Syncope and collapse    Tachycardia 12/20/2015    Past Surgical History:  Procedure Laterality Date   ESOPHAGOGASTRODUODENOSCOPY (EGD) WITH PROPOFOL N/A 01/29/2020   Procedure: ESOPHAGOGASTRODUODENOSCOPY (EGD) WITH PROPOFOL;  Surgeon:  Shellia Cleverly, DO;  Location: [facility];  Service: Gastroenterology;  Laterality: N/A;   IR PARACENTESIS  01/29/2020   IR PARACENTESIS  12/27/2020   neck fusion     c5-7   TONSILLECTOMY     UPPER GASTROINTESTINAL ENDOSCOPY      Prior to Admission medications   Medication Sig Start Date End Date Taking? Authorizing Provider  amphetamine-dextroamphetamine (ADDERALL) 20 MG tablet Take 20 mg by mouth 2 (two) times daily. 12/15/21  Yes [provider]  ciprofloxacin (CIPRO) 500 MG tablet Take 1 tablet twice a day for 7 days, then take one table daily long term 01/01/21  Yes Regalado, Belkys A, MD  furosemide (LASIX) 20 MG tablet Take 1 tablet (20 mg total) by mouth daily. 01/02/21  Yes Regalado, Belkys A, MD  Multiple Vitamins-Minerals (MULTI + OMEGA-3 ADULT GUMMIES PO) Take 1 tablet by mouth daily.   Yes [provider]  polyethylene glycol (MIRALAX / GLYCOLAX) packet Take 17 g by mouth daily. 07/30/17  Yes Arnetha Courser, MD  spironolactone (ALDACTONE) 50 MG tablet Take 1 tablet (50 mg total) by mouth daily. 01/01/21  Yes Regalado, Belkys A, MD  albuterol (VENTOLIN HFA) 108 (90 Base) MCG/ACT inhaler INHALE 2 PUFFS INTO THE LUNGS EVERY 6 HOURS AS NEEDED (COUGH). 05/04/19   Inez Catalina, MD    Current Outpatient Medications  Medication Sig Dispense Refill   amphetamine-dextroamphetamine (ADDERALL) 20 MG tablet Take 20 mg by mouth 2 (two) times daily.  ciprofloxacin (CIPRO) 500 MG tablet Take 1 tablet twice a day for 7 days, then take one table daily long term 60 tablet 1   furosemide (LASIX) 20 MG tablet Take 1 tablet (20 mg total) by mouth daily. 30 tablet 3   Multiple Vitamins-Minerals (MULTI + OMEGA-3 ADULT GUMMIES PO) Take 1 tablet by mouth daily.     polyethylene glycol (MIRALAX / GLYCOLAX) packet Take 17 g by mouth daily. 28 packet 2   spironolactone (ALDACTONE) 50 MG tablet Take 1 tablet (50 mg total) by mouth daily. 30 tablet 3   albuterol (VENTOLIN HFA) 108 (90  Base) MCG/ACT inhaler INHALE 2 PUFFS INTO THE LUNGS EVERY 6 HOURS AS NEEDED (COUGH). 18 g 1   Current Facility-Administered Medications  Medication Dose Route Frequency Provider Last Rate Last Admin   0.9 %  sodium chloride infusion  500 mL Intravenous Once Bryden Darden V, DO        Allergies as of 01/03/2022 - Review Complete 01/03/2022  Allergen Reaction Noted   Tramadol Anaphylaxis and Rash 12/29/2007   Other Other (See Comments) 07/09/2012    Family History  Problem Relation Age of Onset   Hypertension Mother    COPD Mother    Mesothelioma Father    COPD Father    Other Sister        Fatty liver disease   Peripheral vascular disease Brother        Clots in his arteries surgically removed   Colon cancer Neg Hx    Stomach cancer Neg Hx    Esophageal cancer Neg Hx    Rectal cancer Neg Hx     Social History   Socioeconomic History   Marital status: Single    Spouse name: Not on file   Number of children: 3   Years of education: Not on file   Highest education level: Not on file  Occupational History   Occupation: Disabled  Tobacco Use   Smoking status: Never   Smokeless tobacco: Never  Vaping Use   Vaping Use: Never used  Substance and Sexual Activity   Alcohol use: No    Alcohol/week: 0.0 standard drinks of alcohol   Drug use: No   Sexual activity: Not on file  Other Topics Concern   Not on file  Social History Narrative   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   Social Determinants of Health   Financial Resource Strain: Not on file  Food Insecurity: Not on file  Transportation Needs: Not on file  Physical Activity: Not on file  Stress: Not on file  Social Connections: Not on file  Intimate Partner Violence: Not on file    Physical Exam: Vital signs in last 24 hours: @BP  (!) 144/84   Pulse 98   Temp 98.2 F (36.8 C)   Ht 5\' 8"  (1.727 m)   Wt 199 lb (90.3 kg)   SpO2 100%   BMI 30.26 kg/m  GEN: NAD EYE: Sclerae  anicteric ENT: MMM CV: Non-tachycardic Pulm: CTA b/l GI: Soft, NT/ND NEURO:  Alert & Oriented x 3   , DO [facility]   01/03/2022 2:49 PM

## 2022-01-03 NOTE — Op Note (Signed)
[place] Patient Name: Jared Bass Procedure Date: 01/03/2022 2:50 PM MRN: [id number] Endoscopist: Gerrit Heck , MD Age: 51 Referring MD:  Date of Birth: 01/05/1971 Gender: Male Account #: [id number] Procedure:                Colonoscopy Indications:              Screening for colorectal malignant neoplasm, This                            is the patient's first colonoscopy Medicines:                Monitored Anesthesia Care Procedure:                Pre-Anesthesia Assessment:                           - Prior to the procedure, a History and Physical                            was performed, and patient medications and                            allergies were reviewed. The patient's tolerance of                            previous anesthesia was also reviewed. The risks                            and benefits of the procedure and the sedation                            options and risks were discussed with the patient.                            All questions were answered, and informed consent                            was obtained. Prior Anticoagulants: The patient has                            taken no previous anticoagulant or antiplatelet                            agents. ASA Grade Assessment: III - A patient with                            severe systemic disease. After reviewing the risks                            and benefits, the patient was deemed in                            satisfactory condition to undergo the procedure.  After obtaining informed consent, the colonoscope                            was passed under direct vision. Throughout the                            procedure, the patient's blood pressure, pulse, and                            oxygen saturations were monitored continuously. The                            Olympus CF-HQ190L ([id number]) Colonoscope was                            introduced through the anus and  advanced to the the                            cecum, identified by appendiceal orifice and                            ileocecal valve. The colonoscopy was technically                            difficult and complex due to inadequate bowel prep.                            The patient tolerated the procedure well. The                            quality of the bowel preparation was inadequate                            (Sutab prep). The ileocecal valve, appendiceal                            orifice, and rectum were photographed. Scope In: 3:05:16 PM Scope Out: 3:15:06 PM Scope Withdrawal Time: 0 hours 6 minutes 40 seconds  Total Procedure Duration: 0 hours 9 minutes 50 seconds  Findings:                 The perianal and digital rectal examinations were                            normal.                           A moderate amount of stool was found in the entire                            colon, interfering with visualization. Lavage of                            the area was performed using copious amounts of tap  water, resulting in incomplete clearance with fair                            visualization.                           Non-bleeding internal hemorrhoids were found during                            retroflexion. The hemorrhoids were small.                           Anal papilla(e) were hypertrophied. Complications:            No immediate complications. Estimated Blood Loss:     Estimated blood loss: none. Impression:               - Preparation of the colon was inadequate.                           - Stool in the entire examined colon.                           - Non-bleeding internal hemorrhoids.                           - Anal papilla(e) were hypertrophied.                           - No specimens collected. Recommendation:           - Patient has a contact number available for                            emergencies. The signs and symptoms of  potential                            delayed complications were discussed with the                            patient. Return to normal activities tomorrow.                            Written discharge instructions were provided to the                            patient.                           - Resume previous diet.                           - Continue present medications.                           - Repeat colonoscopy at next available appointment                            (  within 3 months) because the bowel preparation was                            suboptimal.                           - Plan for extended 2-day bowel preparation with                            Suprep and Dulcolax for repeat colonoscopy. Doristine Locks, MD 01/03/2022 3:27:15 PM

## 2022-01-03 NOTE — Progress Notes (Signed)
Pt in recovery with monitors in place, VSS. Report given to receiving RN. Bite guard was placed with pt awake to ensure comfort. No dental or soft tissue damage noted. 

## 2022-01-03 NOTE — Op Note (Signed)
[facility] Patient Name: Renne Zoucha Procedure Date: 01/03/2022 2:51 PM MRN: [id number] Endoscopist: Gerrit Heck , MD Age: 51 Referring MD:  Date of Birth: 1971-06-28 Gender: Male Account #: [id number] Procedure:                Upper GI endoscopy Indications:              Cirrhosis rule out esophageal varices, Follow-up of                            esophageal varices                           Last EGD was 12/2019 and n/f Grade 1?"2 esophageal                            varices, moderate portal hypertensive gastropathy.                            Started on nadolol, but has since stopped beta                            blocker. Presents today for ongoing surveillance. Medicines:                Monitored Anesthesia Care Procedure:                Pre-Anesthesia Assessment:                           - Prior to the procedure, a History and Physical                            was performed, and patient medications and                            allergies were reviewed. The patient's tolerance of                            previous anesthesia was also reviewed. The risks                            and benefits of the procedure and the sedation                            options and risks were discussed with the patient.                            All questions were answered, and informed consent                            was obtained. Prior Anticoagulants: The patient has                            taken no previous anticoagulant or antiplatelet  agents. ASA Grade Assessment: III - A patient with                            severe systemic disease. After reviewing the risks                            and benefits, the patient was deemed in                            satisfactory condition to undergo the procedure.                           After obtaining informed consent, the endoscope was                            passed under direct vision.  Throughout the                            procedure, the patient's blood pressure, pulse, and                            oxygen saturations were monitored continuously. The                            Endoscope was introduced through the mouth, and                            advanced to the second part of duodenum. The upper                            GI endoscopy was accomplished without difficulty.                            The patient tolerated the procedure well. Scope In: Scope Out: Findings:                 Grade I varices were found in the lower third of                            the esophagus. They were 5 mm in largest diameter.                            These deflated with air insufflation.                           Mild portal hypertensive gastropathy was found in                            the entire examined stomach. There was moderate                            inflammation with 2 non-bleeding erosions in the  pre-pyloric antrum. Biopsies were taken from the                            antrum and gastric body with a cold forceps for                            Helicobacter pylori testing and histology.                            Estimated blood loss was minimal.                           The examined duodenum was normal. Complications:            No immediate complications. Estimated Blood Loss:     Estimated blood loss was minimal. Impression:               - Grade I esophageal varices.                           - Portal hypertensive gastropathy. Biopsied.                           - Moderate gastritis with 2 non-bleeding erosions                            in the pre-pyloric antrum. Biopsied.                           - Normal examined duodenum. Recommendation:           - Patient has a contact number available for                            emergencies. The signs and symptoms of potential                            delayed complications were  discussed with the                            patient. Return to normal activities tomorrow.                            Written discharge instructions were provided to the                            patient.                           - Resume previous diet.                           - Continue present medications.                           - Await pathology results.                           -  Start carvedilol 6.25 mg BID for treatment of                            varices and titrate to HR <60.                           - Repeat upper endoscopy in 2 years for                            surveillance.                           - Follow-up in the [facility]                            as previously scheduled.                           - Colonoscopy today. Doristine Locks, MD 01/03/2022 3:23:34 PM

## 2022-01-04 ENCOUNTER — Telehealth: Payer: Self-pay

## 2022-01-04 NOTE — Telephone Encounter (Signed)
  Follow up Call-     01/03/2022    2:05 PM  Call back number  Post procedure Call Back phone  # [phone]  Permission to leave phone message Yes     Patient questions:  Do you have a fever, pain , or abdominal swelling? No. Pain Score  0 *  Have you tolerated food without any problems? Yes.    Have you been able to return to your normal activities? Yes.    Do you have any questions about your discharge instructions: Diet   No. Medications  No. Follow up visit  No.  Do you have questions or concerns about your Care? No.  Actions: * If pain score is 4 or above: No action needed, pain <4.

## 2022-03-02 ENCOUNTER — Emergency Department (HOSPITAL_COMMUNITY): Payer: Medicare Other

## 2022-03-02 ENCOUNTER — Inpatient Hospital Stay (HOSPITAL_COMMUNITY)
Admission: EM | Admit: 2022-03-02 | Discharge: 2022-03-06 | DRG: 641 | Disposition: A | Discharge status: Home / Self Care | Payer: Medicare Other | Attending: Internal Medicine | Admitting: Internal Medicine

## 2022-03-02 ENCOUNTER — Encounter (HOSPITAL_COMMUNITY): Payer: Self-pay | Admitting: Internal Medicine

## 2022-03-02 ENCOUNTER — Other Ambulatory Visit: Payer: Self-pay

## 2022-03-02 DIAGNOSIS — Z885 Allergy status to narcotic agent status: Secondary | ICD-10-CM | POA: Diagnosis not present

## 2022-03-02 DIAGNOSIS — D696 Thrombocytopenia, unspecified: Secondary | ICD-10-CM | POA: Diagnosis present

## 2022-03-02 DIAGNOSIS — Z79899 Other long term (current) drug therapy: Secondary | ICD-10-CM

## 2022-03-02 DIAGNOSIS — Z91119 Patient's noncompliance with dietary regimen due to unspecified reason: Secondary | ICD-10-CM

## 2022-03-02 DIAGNOSIS — I10 Essential (primary) hypertension: Secondary | ICD-10-CM | POA: Diagnosis present

## 2022-03-02 DIAGNOSIS — Z7682 Awaiting organ transplant status: Secondary | ICD-10-CM | POA: Diagnosis not present

## 2022-03-02 DIAGNOSIS — R601 Generalized edema: Secondary | ICD-10-CM | POA: Diagnosis present

## 2022-03-02 DIAGNOSIS — G8929 Other chronic pain: Secondary | ICD-10-CM | POA: Diagnosis present

## 2022-03-02 DIAGNOSIS — Z981 Arthrodesis status: Secondary | ICD-10-CM

## 2022-03-02 DIAGNOSIS — Z87828 Personal history of other (healed) physical injury and trauma: Secondary | ICD-10-CM

## 2022-03-02 DIAGNOSIS — K766 Portal hypertension: Secondary | ICD-10-CM | POA: Diagnosis present

## 2022-03-02 DIAGNOSIS — Z8249 Family history of ischemic heart disease and other diseases of the circulatory system: Secondary | ICD-10-CM | POA: Diagnosis not present

## 2022-03-02 DIAGNOSIS — Z91148 Patient's other noncompliance with medication regimen for other reason: Secondary | ICD-10-CM

## 2022-03-02 DIAGNOSIS — Z86711 Personal history of pulmonary embolism: Secondary | ICD-10-CM | POA: Diagnosis not present

## 2022-03-02 DIAGNOSIS — Z888 Allergy status to other drugs, medicaments and biological substances status: Secondary | ICD-10-CM | POA: Diagnosis not present

## 2022-03-02 DIAGNOSIS — I959 Hypotension, unspecified: Secondary | ICD-10-CM | POA: Diagnosis present

## 2022-03-02 DIAGNOSIS — E871 Hypo-osmolality and hyponatremia: Principal | ICD-10-CM | POA: Diagnosis present

## 2022-03-02 DIAGNOSIS — Z825 Family history of asthma and other chronic lower respiratory diseases: Secondary | ICD-10-CM

## 2022-03-02 DIAGNOSIS — D689 Coagulation defect, unspecified: Secondary | ICD-10-CM | POA: Diagnosis present

## 2022-03-02 DIAGNOSIS — K7031 Alcoholic cirrhosis of liver with ascites: Secondary | ICD-10-CM | POA: Diagnosis present

## 2022-03-02 DIAGNOSIS — I482 Chronic atrial fibrillation, unspecified: Secondary | ICD-10-CM | POA: Diagnosis present

## 2022-03-02 LAB — CBC WITH DIFFERENTIAL/PLATELET
Abs Immature Granulocytes: 0.03 10*3/uL (ref 0.00–0.07)
Basophils Absolute: 0.1 10*3/uL (ref 0.0–0.1)
Basophils Relative: 2 %
Eosinophils Absolute: 0.3 10*3/uL (ref 0.0–0.5)
Eosinophils Relative: 3 %
HCT: 27.3 % — ABNORMAL LOW (ref 39.0–52.0)
Hemoglobin: 10 g/dL — ABNORMAL LOW (ref 13.0–17.0)
Immature Granulocytes: 0 %
Lymphocytes Relative: 9 %
Lymphs Abs: 0.7 10*3/uL (ref 0.7–4.0)
MCH: 37.6 pg — ABNORMAL HIGH (ref 26.0–34.0)
MCHC: 36.6 g/dL — ABNORMAL HIGH (ref 30.0–36.0)
MCV: 102.6 fL — ABNORMAL HIGH (ref 80.0–100.0)
Monocytes Absolute: 1.5 10*3/uL — ABNORMAL HIGH (ref 0.1–1.0)
Monocytes Relative: 20 %
Neutro Abs: 4.9 10*3/uL (ref 1.7–7.7)
Neutrophils Relative %: 66 %
Platelets: 87 10*3/uL — ABNORMAL LOW (ref 150–400)
RBC: 2.66 MIL/uL — ABNORMAL LOW (ref 4.22–5.81)
RDW: 15.4 % (ref 11.5–15.5)
WBC: 7.4 10*3/uL (ref 4.0–10.5)
nRBC: 0 % (ref 0.0–0.2)

## 2022-03-02 LAB — COMPREHENSIVE METABOLIC PANEL
ALT: 54 U/L — ABNORMAL HIGH (ref 0–44)
AST: 82 U/L — ABNORMAL HIGH (ref 15–41)
Albumin: 2.1 g/dL — ABNORMAL LOW (ref 3.5–5.0)
Alkaline Phosphatase: 133 U/L — ABNORMAL HIGH (ref 38–126)
Anion gap: 7 (ref 5–15)
BUN: 24 mg/dL — ABNORMAL HIGH (ref 6–20)
CO2: 26 mmol/L (ref 22–32)
Calcium: 8.5 mg/dL — ABNORMAL LOW (ref 8.9–10.3)
Chloride: 89 mmol/L — ABNORMAL LOW (ref 98–111)
Creatinine, Ser: 1.19 mg/dL (ref 0.61–1.24)
GFR, Estimated: >60 mL/min (ref >60)
Glucose, Bld: 124 mg/dL — ABNORMAL HIGH (ref 70–99)
Potassium: 4.7 mmol/L (ref 3.5–5.1)
Sodium: 122 mmol/L — ABNORMAL LOW (ref 135–145)
Total Bilirubin: 15 mg/dL — ABNORMAL HIGH (ref 0.3–1.2)
Total Protein: 6.2 g/dL — ABNORMAL LOW (ref 6.5–8.1)

## 2022-03-02 LAB — BRAIN NATRIURETIC PEPTIDE
B Natriuretic Peptide: 34.7 pg/mL (ref 0.0–100.0)
B Natriuretic Peptide: 40.8 pg/mL (ref 0.0–100.0)

## 2022-03-02 LAB — AMMONIA: Ammonia: 14 umol/L (ref 9–35)

## 2022-03-02 LAB — PROTIME-INR
INR: 1.7 — ABNORMAL HIGH (ref 0.8–1.2)
Prothrombin Time: 19.8 seconds — ABNORMAL HIGH (ref 11.4–15.2)

## 2022-03-02 MED ORDER — CARVEDILOL 3.125 MG PO TABS
6.2500 mg | ORAL_TABLET | Freq: Two times a day (BID) | ORAL | Status: DC
  Administered 2022-03-03 – 2022-03-05 (×4): 6.25 mg via ORAL
  Filled 2022-03-02 (×5): qty 2

## 2022-03-02 MED ORDER — FUROSEMIDE 10 MG/ML IJ SOLN
80.0000 mg | Freq: Two times a day (BID) | INTRAMUSCULAR | Status: DC
Start: 2022-03-03 — End: 2022-03-02

## 2022-03-02 MED ORDER — POLYETHYLENE GLYCOL 3350 17 G PO PACK: 17.0000 g | PACK | Freq: Every day | ORAL | Status: DC | PRN

## 2022-03-02 MED ORDER — SORBITOL 70 % SOLN: 30.0000 mL | Freq: Every day | Status: DC | PRN

## 2022-03-02 MED ORDER — ADULT MULTIVITAMIN W/MINERALS CH
1.0000 | ORAL_TABLET | Freq: Every day | ORAL | Status: DC
  Administered 2022-03-02 – 2022-03-06 (×5): 1 via ORAL
  Filled 2022-03-02 (×5): qty 1

## 2022-03-02 MED ORDER — FUROSEMIDE 10 MG/ML IJ SOLN
40.0000 mg | Freq: Two times a day (BID) | INTRAMUSCULAR | Status: DC
  Administered 2022-03-03: 40 mg via INTRAVENOUS
  Filled 2022-03-02 (×2): qty 4

## 2022-03-02 MED ORDER — CIPROFLOXACIN HCL 500 MG PO TABS
500.0000 mg | ORAL_TABLET | Freq: Every day | ORAL | Status: DC
  Administered 2022-03-02 – 2022-03-06 (×5): 500 mg via ORAL
  Filled 2022-03-02 (×5): qty 1

## 2022-03-02 MED ORDER — ONDANSETRON HCL 4 MG PO TABS: 4.0000 mg | ORAL_TABLET | Freq: Four times a day (QID) | ORAL | Status: DC | PRN

## 2022-03-02 MED ORDER — POLYETHYLENE GLYCOL 3350 17 G PO PACK
17.0000 g | PACK | Freq: Every day | ORAL | Status: DC
  Administered 2022-03-05: 17 g via ORAL
  Filled 2022-03-02 (×4): qty 1

## 2022-03-02 MED ORDER — SODIUM CHLORIDE 0.9 % IV SOLN: 250.0000 mL | INTRAVENOUS | Status: DC | PRN

## 2022-03-02 MED ORDER — SPIRONOLACTONE 25 MG PO TABS
50.0000 mg | ORAL_TABLET | Freq: Every day | ORAL | Status: DC
  Administered 2022-03-03 – 2022-03-06 (×4): 50 mg via ORAL
  Filled 2022-03-02 (×4): qty 2

## 2022-03-02 MED ORDER — FUROSEMIDE 10 MG/ML IJ SOLN: 40.0000 mg | Freq: Two times a day (BID) | INTRAMUSCULAR | Status: DC

## 2022-03-02 MED ORDER — AMPHETAMINE-DEXTROAMPHETAMINE 10 MG PO TABS
20.0000 mg | ORAL_TABLET | Freq: Two times a day (BID) | ORAL | Status: DC
  Administered 2022-03-03 – 2022-03-06 (×7): 20 mg via ORAL
  Filled 2022-03-02 (×9): qty 2

## 2022-03-02 MED ORDER — OXYCODONE HCL 5 MG PO TABS
5.0000 mg | ORAL_TABLET | ORAL | Status: DC | PRN
  Administered 2022-03-02 – 2022-03-03 (×3): 5 mg via ORAL
  Filled 2022-03-02 (×4): qty 1

## 2022-03-02 MED ORDER — THIAMINE MONONITRATE 100 MG PO TABS
100.0000 mg | ORAL_TABLET | Freq: Every day | ORAL | Status: DC
  Administered 2022-03-02 – 2022-03-06 (×5): 100 mg via ORAL
  Filled 2022-03-02 (×5): qty 1

## 2022-03-02 MED ORDER — ONDANSETRON HCL 4 MG/2ML IJ SOLN: 4.0000 mg | Freq: Four times a day (QID) | INTRAMUSCULAR | Status: DC | PRN

## 2022-03-02 MED ORDER — IBUPROFEN 400 MG PO TABS
400.0000 mg | ORAL_TABLET | Freq: Four times a day (QID) | ORAL | Status: DC | PRN
  Filled 2022-03-02: qty 1

## 2022-03-02 MED ORDER — SODIUM CHLORIDE 0.9% FLUSH: 3.0000 mL | INTRAVENOUS | Status: DC | PRN

## 2022-03-02 MED ORDER — OXYCODONE HCL 5 MG PO TABS: 5.0000 mg | ORAL_TABLET | ORAL | Status: DC | PRN

## 2022-03-02 MED ORDER — SODIUM CHLORIDE 0.9% FLUSH
3.0000 mL | Freq: Two times a day (BID) | INTRAVENOUS | Status: DC
Start: 2022-03-02 — End: 2022-03-06
  Administered 2022-03-02 – 2022-03-06 (×8): 3 mL via INTRAVENOUS

## 2022-03-02 NOTE — ED Provider Notes (Signed)
[facility] EMERGENCY DEPARTMENT Provider Note   CSN: [id number] Arrival date & time: 03/02/22  1408     History  Chief Complaint  Patient presents with   Shortness of Breath    Jared Bass is a 51 y.o. male with a pmh of alcoholic cirrhosis on transplant list at [facility] who presents with a cc of ascities and sob. He has had a paracentesis in the past. He c/o severe abdominal swelling and BL lower extremity edea over the past week. He has sig sob with any exertion.  He also has some shortness of breath at rest due to the tense edema in his abdomen.  He has no other complaints at this time.  He denies chest pain, cough, fever, chills.  He states that he cannot even remember the last time he had a paracentesis.   Shortness of Breath      Home Medications Prior to Admission medications   Medication Sig Start Date End Date Taking? Authorizing Provider  albuterol (VENTOLIN HFA) 108 (90 Base) MCG/ACT inhaler INHALE 2 PUFFS INTO THE LUNGS EVERY 6 HOURS AS NEEDED (COUGH). 05/04/19   Inez Catalina, MD  amphetamine-dextroamphetamine (ADDERALL) 20 MG tablet Take 20 mg by mouth 2 (two) times daily. 12/15/21   [provider]  carvedilol (COREG) 6.25 MG tablet Take 1 tablet (6.25 mg total) by mouth 2 (two) times daily with a meal. 01/03/22   Cirigliano, Vito V, DO  ciprofloxacin (CIPRO) 500 MG tablet Take 1 tablet twice a day for 7 days, then take one table daily long term 01/01/21   Regalado, Belkys A, MD  furosemide (LASIX) 20 MG tablet Take 1 tablet (20 mg total) by mouth daily. 01/02/21   Regalado, Belkys A, MD  Multiple Vitamins-Minerals (MULTI + OMEGA-3 ADULT GUMMIES PO) Take 1 tablet by mouth daily.    [provider]  polyethylene glycol (MIRALAX / GLYCOLAX) packet Take 17 g by mouth daily. 07/30/17   Arnetha Courser, MD  spironolactone (ALDACTONE) 50 MG tablet Take 1 tablet (50 mg total) by mouth daily. 01/01/21   Regalado, Jon Billings A, MD      Allergies    Tramadol  and Other    Review of Systems   Review of Systems  Respiratory:  Positive for shortness of breath.     Physical Exam Updated Vital Signs BP 117/77 (BP Location: Right Arm)   Pulse 99   Temp 97.9 F (36.6 C) (Oral)   Resp 18   SpO2 100%  Physical Exam Vitals and nursing note reviewed.  Constitutional:      General: He is not in acute distress.    Appearance: He is well-developed. He is not diaphoretic.  HENT:     Head: Normocephalic and atraumatic.  Eyes:     General: Scleral icterus present.     Conjunctiva/sclera: Conjunctivae normal.  Cardiovascular:     Rate and Rhythm: Normal rate and regular rhythm.     Heart sounds: Normal heart sounds.  Pulmonary:     Effort: Pulmonary effort is normal. No respiratory distress.     Breath sounds: Examination of the right-lower field reveals decreased breath sounds. Examination of the left-lower field reveals decreased breath sounds. Decreased breath sounds present.  Abdominal:     General: There is distension.     Palpations: Abdomen is soft. There is fluid wave.     Tenderness: There is generalized abdominal tenderness.  Musculoskeletal:     Cervical back: Normal range of motion and  neck supple.  Skin:    General: Skin is warm and dry.     Coloration: Skin is jaundiced.  Neurological:     Mental Status: He is alert.  Psychiatric:        Behavior: Behavior normal.     ED Results / Procedures / Treatments   Labs (all labs ordered are listed, but only abnormal results are displayed) Labs Reviewed  CBC WITH DIFFERENTIAL/PLATELET - Abnormal; Notable for the following components:      Result Value   RBC 2.66 (*)    Hemoglobin 10.0 (*)    HCT 27.3 (*)    MCV 102.6 (*)    MCH 37.6 (*)    MCHC 36.6 (*)    Platelets 87 (*)    Monocytes Absolute 1.5 (*)    All other components within normal limits  COMPREHENSIVE METABOLIC PANEL - Abnormal; Notable for the following components:   Sodium 122 (*)    Chloride 89 (*)     Glucose, Bld 124 (*)    BUN 24 (*)    Calcium 8.5 (*)    Total Protein 6.2 (*)    Albumin 2.1 (*)    AST 82 (*)    ALT 54 (*)    Alkaline Phosphatase 133 (*)    Total Bilirubin 15.0 (*)    All other components within normal limits  BRAIN NATRIURETIC PEPTIDE  URINALYSIS, ROUTINE W REFLEX MICROSCOPIC  BRAIN NATRIURETIC PEPTIDE    EKG None  Radiology DG Chest 2 View  Result Date: 03/02/2022 CLINICAL DATA:  Shortness of breath EXAM: CHEST - 2 VIEW COMPARISON:  12/27/2020 FINDINGS: The heart size and mediastinal contours are within normal limits. Small left pleural effusion and associated atelectasis or consolidation. The visualized skeletal structures are unremarkable. IMPRESSION: Small left pleural effusion and associated atelectasis or consolidation. Electronically Signed   By: Jearld Lesch M.D.   On: 03/02/2022 14:56    Procedures Procedures    Medications Ordered in ED Medications - No data to display  ED Course/ Medical Decision Making/ A&P Clinical Course as of 03/02/22 2332  Fri Mar 02, 2022  1643 Sodium(!): 122 [AH]  1643 Patient sodium level 122 likely hypervolemic/increased Lasix dosing.  Patient has been taking 80 mg twice daily.  His doctor told him to stop a couple of days ago however he has continued to take it due to his significant volume overload.  Patient is not drinking alcohol so I have low suspicion for beer potomania.  [AH]  2332 x EKG shows sinus tachycardia at a rate of 106 [AH]  2332 EKG 12-Lead [AH]    Clinical Course User Index [AH] Arthor Captain, PA-C                           Medical Decision Making 51 year old man who presents emergency department with volume overload.  Patient has obvious anasarca.The emergent differential diagnosis for shortness of breath includes, but is not limited to, Pulmonary edema, bronchoconstriction, Pneumonia, Pulmonary embolism, Pneumotherax/ Hemothorax, Dysrythmia, ACS.   Anasarca and ascites seem to be the  underlying cause of the patient's shortness of breath.  I have reviewed the patient's labs.  BMP within normal limits, PT/INR elevated which is consistent with the patient's history of cirrhosis.  Ammonia within normal limits.  CBC shows chronic macrocytic anemia and thrombocytopenia Patient CMP shows significant hyponatremia.  Patient's sodium level is significantly lower than normal although he has chronic hyponatremia.  This is  likely multifactorial due to hypervolemia and increased high-dose loop diuretic usage.  Given these facts patient will require admission.  Personally visualized and interpreted 2 view chest x-ray which shows a small left pleural effusion and some atelectasis.    I have placed an order for ultrasound guided paracentesis.  I personally reviewed and interpreted EKG which shows sinus tachycardia at a rate of 106  Amount and/or Complexity of Data Reviewed Labs: ordered. Decision-making details documented in ED Course. Radiology: independent interpretation performed. ECG/medicine tests: independent interpretation performed.  Risk Decision regarding hospitalization.           Final Clinical Impression(s) / ED Diagnoses Final diagnoses:  None    Rx / DC Orders ED Discharge Orders     None         Arthor Captain, PA-C 03/02/22 2333    Edwin Dada P, DO 03/09/22 0007

## 2022-03-02 NOTE — ED Provider Triage Note (Signed)
Emergency Medicine Provider Triage Evaluation Note  Jared Bass , a 51 y.o. male  was evaluated in triage.  Pt complains of fluid build up x several years, worsen in the last couple of days.Prior hx of cirrhosis, drank alcohol for 20 years and might be getting transplant at [facility]. He has taken fluid pills but no improvement. Swelling to abdomen and legs  Review of Systems  Positive: Leg swelling, SOB Negative: Fever, cough  Physical Exam  There were no vitals taken for this visit. Gen:   Awake, no distress   Resp:  Normal effort  MSK:   Moves extremities without difficulty  Other:  2+ pitting edema BL, abdomen is very distended and painful to touch  Medical Decision Making  Medically screening exam initiated at 2:18 PM.  Appropriate orders placed.  Jared Bass was informed that the remainder of the evaluation will be completed by another provider, this initial triage assessment does not replace that evaluation, and the importance of remaining in the ED until their evaluation is complete.     Jared Manges, PA-C 03/02/22 1423

## 2022-03-02 NOTE — H&P (Signed)
ADMISSION HISTORY AND PHYSICAL  Jared Bass TLX:[id number] DOB: 15-Feb-1971 DOA: 03/02/2022  PCP: Armstead Peaks., MD Patient coming from: home via [facility] ER   Chief Complaint: diffuse swelling with dyspnea   HPI:  51 year old with a history of PE, Atrial fibrillation, HTN, alcohol abuse, and cirrhosis of the liver (with ascites, esophageal varices, and hyponatremia) who is currently on the transplant list at [facility] and has been abstaining from alcohol for prolonged period of time who presented to the ED with progressive worsening of abdominal swelling and bilateral lower extremities over a 7-day timeframe associated with progressively worsening shortness of breath.  He denies chest pain fevers chills or significant abdominal pain apart from that related to the distention.    GI note from June 2023 offers this history: He has been following with [facility] for EtOH cirrhosis c/b EtOH hepatitis in 01/2020, portal hypertension, esophageal varices, ascites, hyponatremia, and recurrent SBP.  Quit drinking in 12/2019.  Last paracentesis was 11/2020 (1.1 L removed). Was last seen at [facility] on 10/26/2021.  Discussing possible living donor transplant (daughter).  Assessment/Plan  Anasarca / decompensated cirrhosis of the liver Inciting event unclear - possibilities include dietary indiscretion or noncompliance w/ diuretic v/s progressive decline in liver fxn - increase diuretic and dose via IV - strict Is/Os - low salt diet - daily weights - paracentesis   Acute hyponatremia on chronic hyponatremia Monitor with diuresis - baseline Na appears to be ~130 - Na 122 at admission   Chronic atrial fibrillation Continue usual carvedilol - not on anticoagulation due to chronic thrombocytopenia/coagulopathy  Chronic thrombocytopenia Due to cirrhosis - no evidence of spontaneous bleeding at this time  Prior history of saddle pulmonary embolism Not a candidate for long-term  anticoagulation given liver disease   DVT prophylaxis: SCDs Code Status: FULL Family Communication: no family present at time of exam  Disposition Plan:  Admit to Inpatient  Consults called: none indicated  Review of Systems: As per HPI otherwise 10 point review of systems negative.   Past Medical History:  Diagnosis Date   Acute renal failure (HCC)    Acute respiratory failure (HCC)    2017   Acute respiratory failure with hypoxia (HCC)    Acute saddle pulmonary embolism with acute cor pulmonale (HCC)    Cataract    Chronic neck pain    Closed fracture of nasal bone    DDD (degenerative disc disease), cervical 05/04/2015   Faintness    Hemoptysis    History of atrial fibrillation    History of head injury 05/04/2015   History of tachycardia 05/04/2015   Hypertension    Hypotension    Pulmonary emboli (HCC)    on elequis   Pulmonary embolism (HCC)    Right eye injury    Saddle pulmonary embolus (HCC) 12/13/2015   Substance abuse (HCC)    Syncope and collapse    Tachycardia 12/20/2015    Past Surgical History:  Procedure Laterality Date   ESOPHAGOGASTRODUODENOSCOPY (EGD) WITH PROPOFOL N/A 01/29/2020   Procedure: ESOPHAGOGASTRODUODENOSCOPY (EGD) WITH PROPOFOL;  Surgeon: Shellia Cleverly, DO;  Location: [facility] ENDOSCOPY;  Service: Gastroenterology;  Laterality: N/A;   IR PARACENTESIS  01/29/2020   IR PARACENTESIS  12/27/2020   neck fusion     c5-7   TONSILLECTOMY     UPPER GASTROINTESTINAL ENDOSCOPY      Family History  Family History  Problem Relation Age of Onset   Hypertension Mother    COPD Mother  Mesothelioma Father    COPD Father    Other Sister        Fatty liver disease   Peripheral vascular disease Brother        Clots in his arteries surgically removed   Colon cancer Neg Hx    Stomach cancer Neg Hx    Esophageal cancer Neg Hx    Rectal cancer Neg Hx     Social History   reports that he has never smoked. He has never used smokeless tobacco.  He reports that he does not drink alcohol and does not use drugs. Stopped drinking alcohol in 2021 after years of heavy drinking.   Allergies Allergies  Allergen Reactions   Tramadol Anaphylaxis and Rash   Other Other (See Comments)    Rembrant Whitening strips  "fat lips"    Prior to Admission medications   Medication Sig Start Date End Date Taking? Authorizing Provider  albuterol (VENTOLIN HFA) 108 (90 Base) MCG/ACT inhaler INHALE 2 PUFFS INTO THE LUNGS EVERY 6 HOURS AS NEEDED (COUGH). 05/04/19   Inez Catalina, MD  amphetamine-dextroamphetamine (ADDERALL) 20 MG tablet Take 20 mg by mouth 2 (two) times daily. 12/15/21   [provider]  carvedilol (COREG) 6.25 MG tablet Take 1 tablet (6.25 mg total) by mouth 2 (two) times daily with a meal. 01/03/22   Cirigliano, Vito V, DO  ciprofloxacin (CIPRO) 500 MG tablet Take 1 tablet twice a day for 7 days, then take one table daily long term 01/01/21   Regalado, Belkys A, MD  furosemide (LASIX) 20 MG tablet Take 1 tablet (20 mg total) by mouth daily. 01/02/21   Regalado, Belkys A, MD  Multiple Vitamins-Minerals (MULTI + OMEGA-3 ADULT GUMMIES PO) Take 1 tablet by mouth daily.    [provider]  polyethylene glycol (MIRALAX / GLYCOLAX) packet Take 17 g by mouth daily. 07/30/17   Arnetha Courser, MD  spironolactone (ALDACTONE) 50 MG tablet Take 1 tablet (50 mg total) by mouth daily. 01/01/21   Regalado, Prentiss Bells, MD    Physical Exam: Vitals:   03/02/22 1423  BP: 117/77  Pulse: 99  Resp: 18  Temp: 97.9 F (36.6 C)  TempSrc: Oral  SpO2: 100%    Constitutional: NAD, calm, comfortable Eyes: PERRL, lids and conjunctivae normal ENMT: Mucous membranes are moist. Posterior pharynx clear of any exudate or lesions.  Neck: normal, supple, no masses, no thyromegaly Respiratory: poor air movement B bases - no wheezing - no focal crackles  Cardiovascular: Regular rate, no murmurs / rubs / gallops. 2+ diffuse edema/anasarca  Abdomen:  mildly  tender diffusely - no masses to palpation. Bowel sounds positive. Soft but protuberant w/ obvious ascites.  Musculoskeletal: No clubbing / cyanosis. No joint deformity upper and lower extremities. No contractures. Normal muscle tone.  Skin: No rashes, lesions, ulcers.  Neurologic: CN 2-12 grossly intact B. Sensation intact.   Psychiatric: Normal judgment and insight. Alert and oriented x 3. Normal mood.    Labs on Admission:   CBC: Recent Labs  Lab 03/02/22 1439  WBC 7.4  NEUTROABS 4.9  HGB 10.0*  HCT 27.3*  MCV 102.6*  PLT 87*   Basic Metabolic Panel: Recent Labs  Lab 03/02/22 1439  NA 122*  K 4.7  CL 89*  CO2 26  GLUCOSE 124*  BUN 24*  CREATININE 1.19  CALCIUM 8.5*   GFR: CrCl cannot be calculated (Unknown ideal weight.).  Liver Function Tests: Recent Labs  Lab 03/02/22 1439  AST 82*  ALT  54*  ALKPHOS 133*  BILITOT 15.0*  PROT 6.2*  ALBUMIN 2.1*   Coagulation Profile: Recent Labs  Lab 03/02/22 1601  INR 1.7*    Urine analysis:    Component Value Date/Time   COLORURINE AMBER (A) 12/27/2020 1202   APPEARANCEUR HAZY (A) 12/27/2020 1202   LABSPEC 1.017 12/27/2020 1202   PHURINE 5.0 12/27/2020 1202   GLUCOSEU NEGATIVE 12/27/2020 1202   HGBUR NEGATIVE 12/27/2020 1202   BILIRUBINUR NEGATIVE 12/27/2020 1202   KETONESUR NEGATIVE 12/27/2020 1202   PROTEINUR NEGATIVE 12/27/2020 1202   UROBILINOGEN 1.0 02/01/2009 1447   NITRITE NEGATIVE 12/27/2020 1202   LEUKOCYTESUR NEGATIVE 12/27/2020 1202    Radiological Exams on Admission: DG Chest 2 View  Result Date: 03/02/2022 CLINICAL DATA:  Shortness of breath EXAM: CHEST - 2 VIEW COMPARISON:  12/27/2020 FINDINGS: The heart size and mediastinal contours are within normal limits. Small left pleural effusion and associated atelectasis or consolidation. The visualized skeletal structures are unremarkable. IMPRESSION: Small left pleural effusion and associated atelectasis or consolidation. Electronically Signed    By: Jearld Lesch M.D.   On: 03/02/2022 14:56     Lonia Blood, MD [facility] Office  [phone] Pager - Text Page per Amion as per below:  On-Call/Text Page:      [email or web address]  If 7PM-7AM, please contact night-coverage [email or web address] 03/02/2022, 4:54 PM

## 2022-03-02 NOTE — Progress Notes (Signed)
NEW ADMISSION NOTE New Admission Note:   Arrival Method: stretcher Mental Orientation: A&OX4 Telemetry:5M01 Assessment: Completed Skin: intact jaundice, bruising bilateral arms, abrasion face, nose, bilateral legs  IV: RAC, LAC  Pain:9 /10 Tubes:none Safety Measures: Safety Fall Prevention Plan has been given, discussed and signed Admission: Completed [facility] Orientation: Patient has been orientated to the room, unit and staff.  Family: none at bedside  Orders have been reviewed and implemented. Will continue to monitor the patient. Call light has been placed within reach and bed alarm has been activated.   Shayne Deerman S Keano [place], RN

## 2022-03-02 NOTE — ED Triage Notes (Signed)
Pt reports DOE x 3 days. Endorses abdominal pain. Hx cirrhosis and abdomen is severely distended.

## 2022-03-03 ENCOUNTER — Inpatient Hospital Stay (HOSPITAL_COMMUNITY): Payer: Medicare Other

## 2022-03-03 DIAGNOSIS — E871 Hypo-osmolality and hyponatremia: Secondary | ICD-10-CM | POA: Diagnosis not present

## 2022-03-03 LAB — BODY FLUID CELL COUNT WITH DIFFERENTIAL
Eos, Fluid: 0 %
Lymphs, Fluid: 74 %
Monocyte-Macrophage-Serous Fluid: 17 % — ABNORMAL LOW (ref 50–90)
Neutrophil Count, Fluid: 9 % (ref 0–25)
Total Nucleated Cell Count, Fluid: 91 cu mm (ref 0–1000)

## 2022-03-03 LAB — GRAM STAIN: Gram Stain: NONE SEEN

## 2022-03-03 LAB — CBC
HCT: 25.4 % — ABNORMAL LOW (ref 39.0–52.0)
Hemoglobin: 9 g/dL — ABNORMAL LOW (ref 13.0–17.0)
MCH: 36.7 pg — ABNORMAL HIGH (ref 26.0–34.0)
MCHC: 35.4 g/dL (ref 30.0–36.0)
MCV: 103.7 fL — ABNORMAL HIGH (ref 80.0–100.0)
Platelets: 75 10*3/uL — ABNORMAL LOW (ref 150–400)
RBC: 2.45 MIL/uL — ABNORMAL LOW (ref 4.22–5.81)
RDW: 15.3 % (ref 11.5–15.5)
WBC: 7.1 10*3/uL (ref 4.0–10.5)
nRBC: 0 % (ref 0.0–0.2)

## 2022-03-03 LAB — BASIC METABOLIC PANEL
Anion gap: 3 — ABNORMAL LOW (ref 5–15)
BUN: 25 mg/dL — ABNORMAL HIGH (ref 6–20)
CO2: 29 mmol/L (ref 22–32)
Calcium: 8.3 mg/dL — ABNORMAL LOW (ref 8.9–10.3)
Chloride: 90 mmol/L — ABNORMAL LOW (ref 98–111)
Creatinine, Ser: 1.17 mg/dL (ref 0.61–1.24)
GFR, Estimated: >60 mL/min (ref >60)
Glucose, Bld: 98 mg/dL (ref 70–99)
Potassium: 4.5 mmol/L (ref 3.5–5.1)
Sodium: 122 mmol/L — ABNORMAL LOW (ref 135–145)

## 2022-03-03 LAB — MAGNESIUM: Magnesium: 2 mg/dL (ref 1.7–2.4)

## 2022-03-03 LAB — PHOSPHORUS: Phosphorus: 4.4 mg/dL (ref 2.5–4.6)

## 2022-03-03 MED ORDER — BACITRACIN-NEOMYCIN-POLYMYXIN OINTMENT TUBE
TOPICAL_OINTMENT | Freq: Two times a day (BID) | CUTANEOUS | Status: DC | PRN
  Filled 2022-03-03: qty 14

## 2022-03-03 MED ORDER — LIDOCAINE HCL (PF) 1 % IJ SOLN
INTRAMUSCULAR | Status: AC
  Filled 2022-03-03: qty 30

## 2022-03-03 MED ORDER — FUROSEMIDE 10 MG/ML IJ SOLN
60.0000 mg | Freq: Two times a day (BID) | INTRAMUSCULAR | Status: DC
  Administered 2022-03-04: 60 mg via INTRAVENOUS
  Filled 2022-03-03: qty 6

## 2022-03-03 NOTE — Procedures (Signed)
PROCEDURE SUMMARY:  Successful US guided paracentesis from RUQ.  Yielded 5.6L of ascitic fluid.  No immediate complications.  Pt tolerated well.   Specimen was sent for labs.  EBL < 42mL  Makenze Ellett PA-C 03/03/2022 12:55 PM

## 2022-03-03 NOTE — Progress Notes (Signed)
Jared Bass  OAC:[id number] DOB: 07/26/1970 DOA: 03/02/2022 PCP: Armstead Peaks., MD    Brief Narrative:  51 year old with a history of PE, Atrial fibrillation, HTN, alcohol abuse, and cirrhosis of the liver (with ascites, esophageal varices, and hyponatremia) who is currently on the transplant list at [facility] and has been abstaining from alcohol for prolonged period of time who presented to the ED with progressive worsening of abdominal swelling and bilateral lower extremities over a 7-day timeframe associated with progressively worsening shortness of breath.  He denies chest pain fevers chills or significant abdominal pain apart from that related to the distention.     GI note from June 2023 offers this history: He has been following with [facility] Transplant Hepatology for EtOH cirrhosis c/b EtOH hepatitis in 01/2020, portal hypertension, esophageal varices, ascites, hyponatremia, and recurrent SBP.  Quit drinking in 12/2019.  Last paracentesis was 11/2020 (1.1 L removed). Was last seen at [facility] Transplant Hepatology on 10/26/2021.  Discussing possible living donor transplant (daughter).  Consultants:  None  Goals of Care:  Code Status: Full Code   DVT prophylaxis: SCDs  Interim Hx: Afebrile.  Vital signs stable since admission.  Oxygen saturation 99% room air.  Sodium stable overnight.  Resting comfortably in bed.  Feeling much better overall after large-volume paracentesis today, which she tolerated without difficulty.  No new complaints.  Significant diffuse edema continues, though abdominal distention is markedly improved.  Assessment & Plan:  Anasarca / decompensated cirrhosis of the liver Inciting event unclear - possibilities include dietary indiscretion or noncompliance w/ diuretic v/s progressive decline in liver fxn - increased diuretic and dosing via IV - strict Is/Os - low salt diet - daily weights -large volume paracentesis 9/2 tolerated without difficulty with initial fluid studies not  suggestive of SBP  Filed Weights   03/02/22 2232 03/03/22 0522  Weight: 100 kg 103.9 kg    Acute hyponatremia on chronic hyponatremia Monitor with diuresis - baseline Na appears to be ~130 - Na 122 at admission   Recent Labs  Lab 03/02/22 1439 03/03/22 0513  NA 122* 122*    Chronic atrial fibrillation Continue usual carvedilol - not on anticoagulation due to chronic thrombocytopenia/coagulopathy -controlled   Chronic thrombocytopenia Due to cirrhosis - no evidence of spontaneous bleeding at this time   Prior history of saddle pulmonary embolism Not a candidate for long-term anticoagulation given liver disease  Family Communication: No family present at time of exam Disposition: PT/OT   Objective: Blood pressure 107/65, pulse (!) 102, temperature 98.2 F (36.8 C), resp. rate 16, weight 103.9 kg, SpO2 99 %.  Intake/Output Summary (Last 24 hours) at 03/03/2022 1007 Last data filed at 03/03/2022 0817 Gross per 24 hour  Intake 300 ml  Output 400 ml  Net -100 ml   Filed Weights   03/02/22 2232 03/03/22 0522  Weight: 100 kg 103.9 kg    Examination: General: No acute respiratory distress Lungs: Clear to auscultation bilaterally without wheezes or crackles Cardiovascular: Regular rate and rhythm without murmur gallop or rub normal S1 and S2 Abdomen: Soft, protuberant but less so than yesterday status post paracentesis, no rebound, soft Extremities: 2+ diffuse edema  CBC: Recent Labs  Lab 03/02/22 1439 03/03/22 0513  WBC 7.4 7.1  NEUTROABS 4.9  --   HGB 10.0* 9.0*  HCT 27.3* 25.4*  MCV 102.6* 103.7*  PLT 87* 75*   Basic Metabolic Panel: Recent Labs  Lab 03/02/22 1439 03/03/22 0513  NA 122* 122*  K 4.7 4.5  CL 89* 90*  CO2 26 29  GLUCOSE 124* 98  BUN 24* 25*  CREATININE 1.19 1.17  CALCIUM 8.5* 8.3*  MG  --  2.0  PHOS  --  4.4   GFR: Estimated Creatinine Clearance: 85.3 mL/min (by C-G formula based on SCr of 1.17 mg/dL).  Liver Function  Tests: Recent Labs  Lab 03/02/22 1439  AST 82*  ALT 54*  ALKPHOS 133*  BILITOT 15.0*  PROT 6.2*  ALBUMIN 2.1*   Scheduled Meds:  amphetamine-dextroamphetamine  20 mg Oral BID   carvedilol  6.25 mg Oral BID WC   ciprofloxacin  500 mg Oral Daily   furosemide  40 mg Intravenous Q12H   multivitamin with minerals  1 tablet Oral Daily   polyethylene glycol  17 g Oral Daily   sodium chloride flush  3 mL Intravenous Q12H   spironolactone  50 mg Oral Daily   thiamine  100 mg Oral Daily     LOS: 1 day   Lonia Blood, MD [facility] Office  [phone] Pager - Text Page per Loretha Stapler  If 7PM-7AM, please contact night-coverage per Amion 03/03/2022, 10:07 AM

## 2022-03-03 NOTE — Plan of Care (Signed)
  Problem: Education: Goal: Knowledge of General Education information will improve Description Including pain rating scale, medication(s)/side effects and non-pharmacologic comfort measures Outcome: Progressing   Problem: Health Behavior/Discharge Planning: Goal: Ability to manage health-related needs will improve Outcome: Progressing   

## 2022-03-04 DIAGNOSIS — E871 Hypo-osmolality and hyponatremia: Secondary | ICD-10-CM | POA: Diagnosis not present

## 2022-03-04 LAB — BASIC METABOLIC PANEL
Anion gap: 8 (ref 5–15)
BUN: 25 mg/dL — ABNORMAL HIGH (ref 6–20)
CO2: 24 mmol/L (ref 22–32)
Calcium: 7.8 mg/dL — ABNORMAL LOW (ref 8.9–10.3)
Chloride: 89 mmol/L — ABNORMAL LOW (ref 98–111)
Creatinine, Ser: 1.18 mg/dL (ref 0.61–1.24)
GFR, Estimated: >60 mL/min (ref >60)
Glucose, Bld: 98 mg/dL (ref 70–99)
Potassium: 3.9 mmol/L (ref 3.5–5.1)
Sodium: 121 mmol/L — ABNORMAL LOW (ref 135–145)

## 2022-03-04 LAB — CBC
HCT: 24.5 % — ABNORMAL LOW (ref 39.0–52.0)
Hemoglobin: 8.8 g/dL — ABNORMAL LOW (ref 13.0–17.0)
MCH: 36.8 pg — ABNORMAL HIGH (ref 26.0–34.0)
MCHC: 35.9 g/dL (ref 30.0–36.0)
MCV: 102.5 fL — ABNORMAL HIGH (ref 80.0–100.0)
Platelets: 63 10*3/uL — ABNORMAL LOW (ref 150–400)
RBC: 2.39 MIL/uL — ABNORMAL LOW (ref 4.22–5.81)
RDW: 15.2 % (ref 11.5–15.5)
WBC: 5.7 10*3/uL (ref 4.0–10.5)
nRBC: 0 % (ref 0.0–0.2)

## 2022-03-04 LAB — HEPATIC FUNCTION PANEL
ALT: 42 U/L (ref 0–44)
AST: 58 U/L — ABNORMAL HIGH (ref 15–41)
Albumin: 1.5 g/dL — ABNORMAL LOW (ref 3.5–5.0)
Alkaline Phosphatase: 106 U/L (ref 38–126)
Bilirubin, Direct: 4.3 mg/dL — ABNORMAL HIGH (ref 0.0–0.2)
Indirect Bilirubin: 6.8 mg/dL — ABNORMAL HIGH (ref 0.3–0.9)
Total Bilirubin: 11.1 mg/dL — ABNORMAL HIGH (ref 0.3–1.2)
Total Protein: 5 g/dL — ABNORMAL LOW (ref 6.5–8.1)

## 2022-03-04 MED ORDER — ALBUTEROL SULFATE HFA 108 (90 BASE) MCG/ACT IN AERS
1.0000 | INHALATION_SPRAY | Freq: Four times a day (QID) | RESPIRATORY_TRACT | Status: DC | PRN
  Administered 2022-03-04: 2 via RESPIRATORY_TRACT
  Filled 2022-03-04: qty 6.7

## 2022-03-04 MED ORDER — ALBUTEROL SULFATE HFA 108 (90 BASE) MCG/ACT IN AERS
1.0000 | INHALATION_SPRAY | RESPIRATORY_TRACT | Status: DC | PRN
  Administered 2022-03-04: 2 via RESPIRATORY_TRACT

## 2022-03-04 MED ORDER — FUROSEMIDE 10 MG/ML IJ SOLN
60.0000 mg | Freq: Three times a day (TID) | INTRAMUSCULAR | Status: DC
  Administered 2022-03-04 – 2022-03-05 (×3): 60 mg via INTRAVENOUS
  Filled 2022-03-04 (×3): qty 6

## 2022-03-04 NOTE — Progress Notes (Signed)
Jared Bass  SAY:[id number] DOB: 05/15/1971 DOA: 03/02/2022 PCP: Armstead Peaks., MD    Brief Narrative:  51 year old with a history of PE, Atrial fibrillation, HTN, alcohol abuse, and cirrhosis of the liver (with ascites, esophageal varices, and hyponatremia) who is currently on the transplant list at [facility] and has been abstaining from alcohol for prolonged period of time who presented to the ED with progressive worsening of abdominal swelling and bilateral lower extremities over a 7-day timeframe associated with progressively worsening shortness of breath.  He denies chest pain fevers chills or significant abdominal pain apart from that related to the distention.     GI note from June 2023 offers this history: He has been following with [facility] Transplant Hepatology for EtOH cirrhosis c/b EtOH hepatitis in 01/2020, portal hypertension, esophageal varices, ascites, hyponatremia, and recurrent SBP.  Quit drinking in 12/2019.  Last paracentesis was 11/2020 (1.1 L removed). Was last seen at [facility] Transplant Hepatology on 10/26/2021.  Discussing possible living donor transplant (daughter).  Consultants:  None  Goals of Care:  Code Status: Full Code   DVT prophylaxis: SCDs  Interim Hx: Afebrile.  Vital signs stable.  Electrolytes stable.  No new complaints today.  Not making much progress with diuresis thus far.  Denies chest pain or shortness of breath.  Is noticing reaccumulation of ascites already.  Assessment & Plan:  Anasarca / decompensated cirrhosis of the liver Inciting event unclear - possibilities include dietary indiscretion or noncompliance w/ diuretic v/s progressive decline in liver fxn - increased diuretic and dosing via IV - strict Is/Os - low salt diet - daily weights -large volume paracentesis 9/2 tolerated without difficulty with initial fluid studies not suggestive of SBP  Filed Weights   03/02/22 2232 03/03/22 0522 03/04/22 0542  Weight: 100 kg 103.9 kg 104.1 kg    Acute  hyponatremia on chronic hyponatremia Monitor with diuresis - baseline Na appears to be ~130 - Na 122 at admission and holding steady despite diuresis  Recent Labs  Lab 03/02/22 1439 03/03/22 0513 03/04/22 0449  NA 122* 122* 121*     Chronic atrial fibrillation Continue usual carvedilol - not on anticoagulation due to chronic thrombocytopenia/coagulopathy -controlled   Chronic thrombocytopenia Due to cirrhosis - no evidence of spontaneous bleeding at this time   Prior history of saddle pulmonary embolism Not a candidate for long-term anticoagulation given liver disease  Family Communication: No family present at time of exam Disposition: PT/OT   Objective: Blood pressure 105/69, pulse 88, temperature 98.3 F (36.8 C), resp. rate 18, weight 104.1 kg, SpO2 99 %.  Intake/Output Summary (Last 24 hours) at 03/04/2022 0954 Last data filed at 03/04/2022 0600 Gross per 24 hour  Intake 960 ml  Output 0 ml  Net 960 ml    Filed Weights   03/02/22 2232 03/03/22 0522 03/04/22 0542  Weight: 100 kg 103.9 kg 104.1 kg    Examination: General: No acute respiratory distress Lungs: Clear to auscultation bilaterally -no wheezing Cardiovascular: RRR Abdomen: Soft, protuberant but less so than yesterday status post paracentesis, no rebound, soft Extremities: 2+ pitting doughy bilateral lower extremities into the thighs but now only trace bilateral upper extremity edema which is a significant improvement  CBC: Recent Labs  Lab 03/02/22 1439 03/03/22 0513 03/04/22 0449  WBC 7.4 7.1 5.7  NEUTROABS 4.9  --   --   HGB 10.0* 9.0* 8.8*  HCT 27.3* 25.4* 24.5*  MCV 102.6* 103.7* 102.5*  PLT 87* 75* 63*    Basic  Metabolic Panel: Recent Labs  Lab 03/02/22 1439 03/03/22 0513 03/04/22 0449  NA 122* 122* 121*  K 4.7 4.5 3.9  CL 89* 90* 89*  CO2 26 29 24   GLUCOSE 124* 98 98  BUN 24* 25* 25*  CREATININE 1.19 1.17 1.18  CALCIUM 8.5* 8.3* 7.8*  MG  --  2.0  --   PHOS  --  4.4  --      GFR: Estimated Creatinine Clearance: 84.7 mL/min (by C-G formula based on SCr of 1.18 mg/dL).  Liver Function Tests: Recent Labs  Lab 03/02/22 1439 03/04/22 0449  AST 82* 58*  ALT 54* 42  ALKPHOS 133* 106  BILITOT 15.0* 11.1*  PROT 6.2* 5.0*  ALBUMIN 2.1* 1.5*    Scheduled Meds:  amphetamine-dextroamphetamine  20 mg Oral BID   carvedilol  6.25 mg Oral BID WC   ciprofloxacin  500 mg Oral Daily   furosemide  60 mg Intravenous Q12H   multivitamin with minerals  1 tablet Oral Daily   polyethylene glycol  17 g Oral Daily   sodium chloride flush  3 mL Intravenous Q12H   spironolactone  50 mg Oral Daily   thiamine  100 mg Oral Daily     LOS: 2 days   05/04/22, MD [facility] Office  [phone] Pager - Text Page per [phone]  If 7PM-7AM, please contact night-coverage per Amion 03/04/2022, 9:54 AM

## 2022-03-05 DIAGNOSIS — E871 Hypo-osmolality and hyponatremia: Secondary | ICD-10-CM | POA: Diagnosis not present

## 2022-03-05 LAB — COMPREHENSIVE METABOLIC PANEL
ALT: 39 U/L (ref 0–44)
AST: 56 U/L — ABNORMAL HIGH (ref 15–41)
Albumin: 1.6 g/dL — ABNORMAL LOW (ref 3.5–5.0)
Alkaline Phosphatase: 115 U/L (ref 38–126)
Anion gap: 6 (ref 5–15)
BUN: 29 mg/dL — ABNORMAL HIGH (ref 6–20)
CO2: 27 mmol/L (ref 22–32)
Calcium: 8.2 mg/dL — ABNORMAL LOW (ref 8.9–10.3)
Chloride: 91 mmol/L — ABNORMAL LOW (ref 98–111)
Creatinine, Ser: 1.6 mg/dL — ABNORMAL HIGH (ref 0.61–1.24)
GFR, Estimated: 51 mL/min — ABNORMAL LOW (ref >60)
Glucose, Bld: 109 mg/dL — ABNORMAL HIGH (ref 70–99)
Potassium: 4.6 mmol/L (ref 3.5–5.1)
Sodium: 124 mmol/L — ABNORMAL LOW (ref 135–145)
Total Bilirubin: 10 mg/dL — ABNORMAL HIGH (ref 0.3–1.2)
Total Protein: 5 g/dL — ABNORMAL LOW (ref 6.5–8.1)

## 2022-03-05 LAB — CBC
HCT: 24.9 % — ABNORMAL LOW (ref 39.0–52.0)
Hemoglobin: 9.1 g/dL — ABNORMAL LOW (ref 13.0–17.0)
MCH: 37.1 pg — ABNORMAL HIGH (ref 26.0–34.0)
MCHC: 36.5 g/dL — ABNORMAL HIGH (ref 30.0–36.0)
MCV: 101.6 fL — ABNORMAL HIGH (ref 80.0–100.0)
Platelets: 62 10*3/uL — ABNORMAL LOW (ref 150–400)
RBC: 2.45 MIL/uL — ABNORMAL LOW (ref 4.22–5.81)
RDW: 15.1 % (ref 11.5–15.5)
WBC: 6.5 10*3/uL (ref 4.0–10.5)
nRBC: 0 % (ref 0.0–0.2)

## 2022-03-05 LAB — PHOSPHORUS: Phosphorus: 4.7 mg/dL — ABNORMAL HIGH (ref 2.5–4.6)

## 2022-03-05 LAB — MAGNESIUM: Magnesium: 1.9 mg/dL (ref 1.7–2.4)

## 2022-03-05 MED ORDER — ORAL CARE MOUTH RINSE: 15.0000 mL | OROMUCOSAL | Status: DC | PRN

## 2022-03-05 MED ORDER — FUROSEMIDE 10 MG/ML IJ SOLN
40.0000 mg | Freq: Two times a day (BID) | INTRAMUSCULAR | Status: DC
Start: 2022-03-05 — End: 2022-03-06
  Administered 2022-03-06: 40 mg via INTRAVENOUS
  Filled 2022-03-05: qty 4

## 2022-03-05 NOTE — TOC Progression Note (Signed)
Transition of Care [facility]) - Initial/Assessment Note    Patient Details  Name: Jared Bass MRN: [id number] Date of Birth: 11/22/1970  Transition of Care [facility]) CM/SW Contact:    Ralene Bathe, LCSWA Phone Number: 03/05/2022, 1:39 PM  Clinical Narrative:                 TOC following patient for any d/c planning needs once medically stable.  PT/OT assessments and recommendations pending.    Cleon Gustin, MSW, LCSWA         Patient Goals and CMS Choice        Expected Discharge Plan and Services                                                Prior Living Arrangements/Services                       Activities of Daily Living Home Assistive Devices/Equipment: None ADL Screening (condition at time of admission) Patient's cognitive ability adequate to safely complete daily activities?: Yes Is the patient deaf or have difficulty hearing?: No Does the patient have difficulty seeing, even when wearing glasses/contacts?: No Does the patient have difficulty concentrating, remembering, or making decisions?: No Patient able to express need for assistance with ADLs?: Yes Does the patient have difficulty dressing or bathing?: No Independently performs ADLs?: Yes (appropriate for developmental age) Does the patient have difficulty walking or climbing stairs?: No Weakness of Legs: Both Weakness of Arms/Hands: None  Permission Sought/Granted                  Emotional Assessment              Admission diagnosis:  Anasarca [R60.1] Hyponatremia [E87.1] Patient Active Problem List   Diagnosis Date Noted   Hyponatremia 03/02/2022   Alcoholic cirrhosis of liver with ascites (HCC)    Generalized abdominal pain    Leukopenia 12/27/2020   Nausea & vomiting 08/09/2020   Thrombocytopenia (HCC) 08/09/2020   Coagulopathy (HCC) 08/09/2020   Unspecified atrial fibrillation (HCC) 08/09/2020   SBP (spontaneous bacterial peritonitis) (HCC) 08/08/2020    Ascites    Secondary esophageal varices without bleeding (HCC)    Portal hypertensive gastropathy (HCC)    Decompensated hepatic cirrhosis (HCC) 01/27/2020   Alcohol abuse    Common bile duct dilation    Jaundice    Transaminitis 09/10/2017   Alcohol use disorder, moderate, dependence (HCC) 07/11/2017   GERD (gastroesophageal reflux disease) 03/12/2017   Opioid use disorder, moderate, dependence (HCC) 01/22/2017   Cervical spinal stenosis 05/04/2015   Cervical foraminal stenosis (right side) 05/04/2015   Generalized anxiety disorder 05/04/2015   PCP:  Armstead Peaks., MD Pharmacy:   [facility] [phone] - [place], [place] - [address] AT [facility] [address] [place] [address] Phone: [phone] Fax: [phone]     Social Determinants of Health (SDOH) Interventions    Readmission Risk Interventions     No data to display

## 2022-03-05 NOTE — Evaluation (Signed)
Physical Therapy Evaluation and Discharge Patient Details Name: Jared Bass MRN: [id number] DOB: 1971/04/17 Today's Date: 03/05/2022  History of Present Illness  51 year old  who presented to the ED with progressive worsening of abdominal swelling and bilateral lower extremities over a 7-day timeframe associated with progressively worsening shortness of breath. +anasarca; hyponatremia;  large-volume paracentesis 9/2;  PMHx- PE, Atrial fibrillation, HTN, alcohol abuse, and cirrhosis of the liver (with ascites, esophageal varices, and hyponatremia) who is currently on the transplant list at [facility] and has been abstaining from alcohol for prolonged period of time  Clinical Impression   Patient evaluated by Physical Therapy with no further acute PT needs identified. Patient ambulated 180 ft independently and passed all balance assessments.  PT is signing off. Thank you for this referral.        Recommendations for follow up therapy are one component of a multi-disciplinary discharge planning process, led by the attending physician.  Recommendations may be updated based on patient status, additional functional criteria and insurance authorization.  Follow Up Recommendations No PT follow up      Assistance Recommended at Discharge None  Patient can return home with the following       Equipment Recommendations None recommended by PT  Recommendations for Other Services       Functional Status Assessment Patient has not had a recent decline in their functional status     Precautions / Restrictions Precautions Precautions: None Restrictions Weight Bearing Restrictions: No      Mobility  Bed Mobility Overal bed mobility: Independent                  Transfers Overall transfer level: Independent Equipment used: None                    Ambulation/Gait Ambulation/Gait assistance: Independent Gait Distance (Feet): 180 Feet Assistive device: None Gait  Pattern/deviations: WFL(Within Functional Limits)   Gait velocity interpretation: >2.62 ft/sec, indicative of community ambulatory   General Gait Details: mild dyspnea wiht sats 97% HR 88  Stairs            Wheelchair Mobility    Modified Rankin (Stroke Patients Only)       Balance Overall balance assessment: Independent (Rhomberg eyes closed x 10 sec; single leg stance 10 sec)                                           Pertinent Vitals/Pain Pain Assessment Pain Assessment: No/denies pain    Home Living Family/patient expects to be discharged to:: Private residence Living Arrangements: Alone Available Help at Discharge: Family;Available PRN/intermittently Type of Home: Other(Comment) (converted shed to private  dwelling) Home Access: Stairs to enter Entrance Stairs-Rails: None Entrance Stairs-Number of Steps: 1   Home Layout: One level Home Equipment: None      Prior Function Prior Level of Function : Independent/Modified Independent                     Hand Dominance        Extremity/Trunk Assessment   Upper Extremity Assessment Upper Extremity Assessment: Defer to OT evaluation    Lower Extremity Assessment Lower Extremity Assessment: Overall WFL for tasks assessed (bil LE edema; wearing compression socks)    Cervical / Trunk Assessment Cervical / Trunk Assessment: Other exceptions Cervical / Trunk Exceptions: anasarca  Communication   Communication:  No difficulties  Cognition Arousal/Alertness: Awake/alert Behavior During Therapy: WFL for tasks assessed/performed Overall Cognitive Status: Within Functional Limits for tasks assessed                                          General Comments      Exercises     Assessment/Plan    PT Assessment Patient does not need any further PT services  PT Problem List         PT Treatment Interventions      PT Goals (Current goals can be found in the Care  Plan section)  Acute Rehab PT Goals PT Goal Formulation: All assessment and education complete, DC therapy    Frequency       Co-evaluation               AM-PAC PT "6 Clicks" Mobility  Outcome Measure Help needed turning from your back to your side while in a flat bed without using bedrails?: None Help needed moving from lying on your back to sitting on the side of a flat bed without using bedrails?: None Help needed moving to and from a bed to a chair (including a wheelchair)?: None Help needed standing up from a chair using your arms (e.g., wheelchair or bedside chair)?: None Help needed to walk in hospital room?: None Help needed climbing 3-5 steps with a railing? : None 6 Click Score: 24    End of Session   Activity Tolerance: Patient tolerated treatment well Patient left: in bed;with call bell/phone within reach Nurse Communication: Mobility status;Other (comment) (no PT needs) PT Visit Diagnosis: Difficulty in walking, not elsewhere classified (R26.2)    Time: 0175-1025 PT Time Calculation (min) (ACUTE ONLY): 20 min   Charges:   PT Evaluation $PT Eval Low Complexity: 1 Low           Jerolyn Center, PT Acute Rehabilitation Services  Office [phone]   Zena Amos 03/05/2022, 2:49 PM

## 2022-03-05 NOTE — Progress Notes (Signed)
Jared Bass  YTK:[id number] DOB: [birth year] DOA: 03/02/2022 PCP: Armstead Peaks., MD    Brief Narrative:  51 year old with a history of PE, Atrial fibrillation, HTN, alcohol abuse, and cirrhosis of the liver (with ascites, esophageal varices, and hyponatremia) who is currently on the transplant list at [facility] and has been abstaining from alcohol for prolonged period of time who presented to the ED with progressive worsening of abdominal swelling and bilateral lower extremities over a 7-day timeframe associated with progressively worsening shortness of breath.  He denies chest pain fevers chills or significant abdominal pain apart from that related to the distention.     GI note from June 2023 offers this history: He has been following with [facility] Transplant Hepatology for EtOH cirrhosis c/b EtOH hepatitis in 01/2020, portal hypertension, esophageal varices, ascites, hyponatremia, and recurrent SBP.  Quit drinking in 12/2019.  Last paracentesis was 11/2020 (1.1 L removed). Was last seen at [facility] Transplant Hepatology on 10/26/2021.  Discussing possible living donor transplant (daughter).  Consultants:  None  Goals of Care:  Code Status: Full Code   DVT prophylaxis: SCDs  Interim Hx: Afebrile.  Vital signs stable.  Systolic blood pressure remains in the mid 90s.  No acute events reported overnight.  Sodium has improved to 124 but creatinine has also increased to 1.6.  No new complaints today.  Resting comfortably in bed.  Denies chest pain or shortness of breath.  Assessment & Plan:  Anasarca / decompensated cirrhosis of the liver Inciting event unclear - possibilities include dietary indiscretion or noncompliance w/ diuretic v/s progressive decline in liver fxn -the patient has been diuresed to the point that he is beginning to develop renal insufficiency forcing [place] to back off on diuretic dosing - strict Is/Os - low salt diet - daily weights -large volume paracentesis 9/2 tolerated without difficulty  with initial fluid studies not suggestive of SBP - MELD 26 / MeldNa 32 today   Filed Weights   03/03/22 0522 03/04/22 0542 03/05/22 0459  Weight: 103.9 kg 104.1 kg 98.3 kg    Acute hyponatremia on chronic hyponatremia Has improved with diuresis - baseline Na appears to be ~130 - Na 122 at admission  Recent Labs  Lab 03/02/22 1439 03/03/22 0513 03/04/22 0449 03/05/22 0300  NA 122* 122* 121* 124*     Chronic atrial fibrillation Holding usual Coreg due to hypotension- not on anticoagulation due to chronic thrombocytopenia/coagulopathy -controlled   Chronic thrombocytopenia Due to cirrhosis - no evidence of spontaneous bleeding at this time   Prior history of saddle pulmonary embolism Not a candidate for long-term anticoagulation given liver disease  Family Communication: No family present at time of exam Disposition: PT/OT   Objective: Blood pressure (!) 92/57, pulse 81, temperature 98.3 F (36.8 C), resp. rate 17, height 5\' 8"  (1.727 m), weight 98.3 kg, SpO2 100 %.  Intake/Output Summary (Last 24 hours) at 03/05/2022 0937 Last data filed at 03/05/2022 0856 Gross per 24 hour  Intake 963 ml  Output 1400 ml  Net -437 ml    Filed Weights   03/03/22 0522 03/04/22 0542 03/05/22 0459  Weight: 103.9 kg 104.1 kg 98.3 kg    Examination: General: No acute respiratory distress Lungs: Clear to auscultation bilaterally  Cardiovascular: RRR Abdomen: Soft, protuberant, fluid wave+, no rebound, no mass Extremities: 2+ pitting doughy bilateral lower extremities into the thighs - trace bilateral upper extremity edema   CBC: Recent Labs  Lab 03/02/22 1439 03/03/22 0513 03/04/22 0449 03/05/22 0300  WBC  7.4 7.1 5.7 6.5  NEUTROABS 4.9  --   --   --   HGB 10.0* 9.0* 8.8* 9.1*  HCT 27.3* 25.4* 24.5* 24.9*  MCV 102.6* 103.7* 102.5* 101.6*  PLT 87* 75* 63* 62*    Basic Metabolic Panel: Recent Labs  Lab 03/03/22 0513 03/04/22 0449 03/05/22 0300  NA 122* 121* 124*  K 4.5  3.9 4.6  CL 90* 89* 91*  CO2 29 24 27   GLUCOSE 98 98 109*  BUN 25* 25* 29*  CREATININE 1.17 1.18 1.60*  CALCIUM 8.3* 7.8* 8.2*  MG 2.0  --  1.9  PHOS 4.4  --  4.7*    GFR: Estimated Creatinine Clearance: 60.7 mL/min (A) (by C-G formula based on SCr of 1.6 mg/dL (H)).  Liver Function Tests: Recent Labs  Lab 03/02/22 1439 03/04/22 0449 03/05/22 0300  AST 82* 58* 56*  ALT 54* 42 39  ALKPHOS 133* 106 115  BILITOT 15.0* 11.1* 10.0*  PROT 6.2* 5.0* 5.0*  ALBUMIN 2.1* 1.5* 1.6*    Scheduled Meds:  amphetamine-dextroamphetamine  20 mg Oral BID   carvedilol  6.25 mg Oral BID WC   ciprofloxacin  500 mg Oral Daily   furosemide  60 mg Intravenous Q8H   multivitamin with minerals  1 tablet Oral Daily   polyethylene glycol  17 g Oral Daily   sodium chloride flush  3 mL Intravenous Q12H   spironolactone  50 mg Oral Daily   thiamine  100 mg Oral Daily     LOS: 3 days   05/05/22, MD [facility] Office  [phone] Pager - Text Page per [phone]  If 7PM-7AM, please contact night-coverage per Amion 03/05/2022, 9:37 AM

## 2022-03-05 NOTE — Care Management Important Message (Signed)
Important Message  Patient Details  Name: Jared Bass MRN: [id number] Date of Birth: 1971-01-26   Medicare Important Message Given:  Yes     [name] 03/05/2022, 12:27 PM

## 2022-03-06 ENCOUNTER — Other Ambulatory Visit (HOSPITAL_COMMUNITY): Payer: Self-pay

## 2022-03-06 DIAGNOSIS — E871 Hypo-osmolality and hyponatremia: Secondary | ICD-10-CM | POA: Diagnosis not present

## 2022-03-06 LAB — COMPREHENSIVE METABOLIC PANEL
ALT: 39 U/L (ref 0–44)
AST: 62 U/L — ABNORMAL HIGH (ref 15–41)
Albumin: 1.7 g/dL — ABNORMAL LOW (ref 3.5–5.0)
Alkaline Phosphatase: 116 U/L (ref 38–126)
Anion gap: 8 (ref 5–15)
BUN: 30 mg/dL — ABNORMAL HIGH (ref 6–20)
CO2: 27 mmol/L (ref 22–32)
Calcium: 8.1 mg/dL — ABNORMAL LOW (ref 8.9–10.3)
Chloride: 90 mmol/L — ABNORMAL LOW (ref 98–111)
Creatinine, Ser: 1.49 mg/dL — ABNORMAL HIGH (ref 0.61–1.24)
GFR, Estimated: 56 mL/min — ABNORMAL LOW (ref >60)
Glucose, Bld: 116 mg/dL — ABNORMAL HIGH (ref 70–99)
Potassium: 4.1 mmol/L (ref 3.5–5.1)
Sodium: 125 mmol/L — ABNORMAL LOW (ref 135–145)
Total Bilirubin: 9.7 mg/dL — ABNORMAL HIGH (ref 0.3–1.2)
Total Protein: 5.4 g/dL — ABNORMAL LOW (ref 6.5–8.1)

## 2022-03-06 LAB — CBC
HCT: 25.8 % — ABNORMAL LOW (ref 39.0–52.0)
Hemoglobin: 9.6 g/dL — ABNORMAL LOW (ref 13.0–17.0)
MCH: 37.5 pg — ABNORMAL HIGH (ref 26.0–34.0)
MCHC: 37.2 g/dL — ABNORMAL HIGH (ref 30.0–36.0)
MCV: 100.8 fL — ABNORMAL HIGH (ref 80.0–100.0)
Platelets: 71 10*3/uL — ABNORMAL LOW (ref 150–400)
RBC: 2.56 MIL/uL — ABNORMAL LOW (ref 4.22–5.81)
RDW: 15.2 % (ref 11.5–15.5)
WBC: 6.4 10*3/uL (ref 4.0–10.5)
nRBC: 0 % (ref 0.0–0.2)

## 2022-03-06 LAB — PROTIME-INR
INR: 2.2 — ABNORMAL HIGH (ref 0.8–1.2)
Prothrombin Time: 24.1 seconds — ABNORMAL HIGH (ref 11.4–15.2)

## 2022-03-06 LAB — AMMONIA: Ammonia: 31 umol/L (ref 9–35)

## 2022-03-06 MED ORDER — CIPROFLOXACIN HCL 500 MG PO TABS
500.0000 mg | ORAL_TABLET | Freq: Every day | ORAL | Status: AC
Start: 2022-03-07 — End: ?

## 2022-03-06 MED ORDER — FUROSEMIDE 40 MG PO TABS
40.0000 mg | ORAL_TABLET | Freq: Two times a day (BID) | ORAL | 2 refills | Status: AC
  Filled 2022-03-06: qty 60, 30d supply, fill #0

## 2022-03-06 NOTE — Progress Notes (Signed)
Discharge Note  Jared Bass to be discharged Home per MD order.  Discussed prescriptions and follow up appointments with the patient. Prescriptions given to patient; medication list explained in detail. Patient verbalized understanding.  Skin clean, dry and intact without evidence of skin break down, no evidence of skin tears noted. IV catheter discontinued intact. Site without signs and symptoms of complications. Dressing and pressure applied. Pt denies pain at the site currently. No complaints noted.  Patient free of lines, drains, and wounds.   An After Visit Summary was printed and given to the patient. Patient escorted via wheelchair, and discharged home via private auto.  Margarita Grizzle, RN MSN

## 2022-03-06 NOTE — Plan of Care (Signed)

## 2022-03-06 NOTE — Progress Notes (Signed)
Mobility Specialist Progress Note:   03/06/22 1150  Mobility  Activity Moved into chair position in bed  Level of Assistance Independent  Assistive Device None  Activity Response Tolerated well  $Mobility charge 1 Mobility   Pt just received lunch, therefore pt declining OOB mobility. Moved into chair position in bed to eat. Will f/u for hopeful ambulation.   Addison Lank Acute Rehab Secure Chat or Office Phone: [phone]

## 2022-03-06 NOTE — TOC Transition Note (Signed)
Transition of Care [facility]) - CM/SW Discharge Note   Patient Details  Name: Jared Bass MRN: [id number] Date of Birth: 04-27-1971  Transition of Care [facility]) CM/SW Contact:  Tom-Johnson, Hershal Coria, RN Phone Number: 03/06/2022, 12:11 PM   Clinical Narrative:     Patient is scheduled for discharge today. Admitted for Hyponatremia. Has hx of Liver Cirrhosis with ascites and on Liver transplant list at [facility]. Had Paracentesis on 09/02 and 5.6L fluids removed. Patient states he is a little distended.  From home alone. Has three supportive daughters. Independent with care and drove self to the hospital and states he is driving himself home at discharge. States he helps his 25 yr old aunt that lives close by.  PCP is   Armstead Peaks., MD and uses  [facility] on [address]. No PT f/u noted. Denies any needs.  No further TOC needs noted.    Final next level of care: Home/Self Care Barriers to Discharge: Barriers Resolved   Patient Goals and CMS Choice Patient states their goals for this hospitalization and ongoing recovery are:: To return home CMS Medicare.gov Compare Post Acute Care list provided to:: Patient    Discharge Placement                Patient to be transferred to facility by: Self      Discharge Plan and Services                DME Arranged: N/A DME Agency: NA       HH Arranged: NA HH Agency: NA        Social Determinants of Health (SDOH) Interventions     Readmission Risk Interventions     No data to display

## 2022-03-06 NOTE — Discharge Summary (Signed)
DISCHARGE SUMMARY  Jared Bass  MR#: [id number]  DOB:11-Feb-1971  Date of Admission: 03/02/2022 Date of Discharge: 03/06/2022  Attending Physician:Jared Pinzon Silvestre Gunner, MD  Patient's YIR:SWNIO, Jared Bass., MD  Consults: none  Disposition: D/C home   Follow-up Appts:  Follow-up Information     Jared Bass., MD Follow up in 5 day(s).   Specialty: Family Medicine                Tests Needing Follow-up: -recheck Na level -assess volume status / ascites   Discharge Diagnoses: Anasarca / decompensated cirrhosis of the liver Acute hyponatremia on chronic hyponatremia Chronic atrial fibrillation Chronic thrombocytopenia Prior history of saddle pulmonary embolism  Initial presentation: 51 year old with a history of PE, Atrial fibrillation, HTN, alcohol abuse, and cirrhosis of the liver (with ascites, esophageal varices, and hyponatremia) who is currently on the transplant list at [facility] and has been abstaining from alcohol for prolonged period of time who presented to the ED with progressive worsening of abdominal swelling and bilateral lower extremities over a 7-day timeframe associated with progressively worsening shortness of breath.  He denies chest pain fevers chills or significant abdominal pain apart from that related to the distention.     GI note from June 2023 offers this history: He has been following with [facility] Trans[facility]atology for EtOH cirrhosis c/b EtOH hepatitis in 01/2020, portal hypertension, esophageal varices, ascites, hyponatremia, and recurrent SBP.  Quit drinking in 12/2019.  Last paracentesis was 11/2020 (1.1 L removed). Was last seen at [facility] Trans[facility]ology on 10/26/2021.  Discussing possible living donor transplant (daughter).  Hospital Course:  Anasarca / decompensated cirrhosis of the liver Inciting event unclear - possibilities include dietary indiscretion or noncompliance w/ diuretic v/s progressive decline in liver fxn - the patient was  diuresed to the point that he began to develop renal insufficiency  -renal function subsequently improved with decreased dose of diuretic - continue low salt diet - daily weights - large volume paracentesis 9/2 tolerated without difficulty with fluid studies not suggestive of SBP - MeldNa 33 today - appears to be maximally diuresed at time of d/c - peripheral edema and ascites persist, but no longer sob / no signif pulm edema on exam and BP and renal fxn will not presently tolerate further volume removal - d/c home on increased dose of lasix from prior (40 QD > 40 BID)  Acute hyponatremia on chronic hyponatremia Has improved with diuresis - baseline Na appears to be ~130 - Na 122 at admission  Recent Labs  Lab 03/02/22 1439 03/03/22 0513 03/04/22 0449 03/05/22 0300 03/06/22 0222  NA 122* 122* 121* 124* 125*    Chronic atrial fibrillation Stopped usual Coreg due to hypotension- not on anticoagulation due to chronic thrombocytopenia/coagulopathy -controlled HR th/o the hospital stay    Chronic thrombocytopenia Due to cirrhosis - no evidence of spontaneous bleeding at this time -platelet count stable at approximately 70   Prior history of saddle pulmonary embolism Not a candidate for long-term anticoagulation given liver disease - no sx to suggest recurrent clotting     Allergies as of 03/06/2022       Reactions   Tramadol Anaphylaxis, Rash   Other Swelling, Other (See Comments)   Rembrandt Whitening strips - "fat lips"        Medication List     STOP taking these medications    carvedilol 6.25 MG tablet Commonly known as: Coreg       TAKE these medications  albuterol 108 (90 Base) MCG/ACT inhaler Commonly known as: VENTOLIN HFA INHALE 2 PUFFS INTO THE LUNGS EVERY 6 HOURS AS NEEDED (COUGH). What changed: See the new instructions.   amphetamine-dextroamphetamine 20 MG tablet Commonly known as: ADDERALL Take 20 mg by mouth 2 (two) times daily.   ciprofloxacin 500  MG tablet Commonly known as: Cipro Take 1 tablet (500 mg total) by mouth daily. Start taking on: March 07, 2022 What changed:  how much to take how to take this when to take this additional instructions   furosemide 40 MG tablet Commonly known as: LASIX Take 1 tablet (40 mg total) by mouth 2 (two) times daily. What changed: when to take this   MULTI + OMEGA-3 ADULT GUMMIES PO Take 1 tablet by mouth daily.   polyethylene glycol 17 g packet Commonly known as: MIRALAX / GLYCOLAX Take 17 g by mouth daily. What changed:  when to take this reasons to take this   spironolactone 50 MG tablet Commonly known as: ALDACTONE Take 1 tablet (50 mg total) by mouth daily.        Day of Discharge BP 98/65 (BP Location: Right Arm)   Pulse 81   Temp 98 F (36.7 C) (Oral)   Resp 18   Ht 5\' 8"  (1.727 m)   Wt 98.3 kg   SpO2 100%   BMI 32.95 kg/m   Physical Exam: General: No acute respiratory distress Lungs: Clear to auscultation bilaterally without wheezes or crackles Cardiovascular: Regular rate and rhythm without murmur gallop or rub normal S1 and S2 Abdomen: distended, + fluid wave, no mass, nontender, no rebound  Extremities: 2+ pitting B LE edema to mid thighs   Basic Metabolic Panel: Recent Labs  Lab 03/02/22 1439 03/03/22 0513 03/04/22 0449 03/05/22 0300 03/06/22 0222  NA 122* 122* 121* 124* 125*  K 4.7 4.5 3.9 4.6 4.1  CL 89* 90* 89* 91* 90*  CO2 26 29 24 27 27   GLUCOSE 124* 98 98 109* 116*  BUN 24* 25* 25* 29* 30*  CREATININE 1.19 1.17 1.18 1.60* 1.49*  CALCIUM 8.5* 8.3* 7.8* 8.2* 8.1*  MG  --  2.0  --  1.9  --   PHOS  --  4.4  --  4.7*  --     Liver Function Tests: Recent Labs  Lab 03/02/22 1439 03/04/22 0449 03/05/22 0300 03/06/22 0222  AST 82* 58* 56* 62*  ALT 54* 42 39 39  ALKPHOS 133* 106 115 116  BILITOT 15.0* 11.1* 10.0* 9.7*  PROT 6.2* 5.0* 5.0* 5.4*  ALBUMIN 2.1* 1.5* 1.6* 1.7*    CBC: Recent Labs  Lab 03/02/22 1439  03/03/22 0513 03/04/22 0449 03/05/22 0300 03/06/22 0222  WBC 7.4 7.1 5.7 6.5 6.4  NEUTROABS 4.9  --   --   --   --   HGB 10.0* 9.0* 8.8* 9.1* 9.6*  HCT 27.3* 25.4* 24.5* 24.9* 25.8*  MCV 102.6* 103.7* 102.5* 101.6* 100.8*  PLT 87* 75* 63* 62* 71*     Recent Results (from the past 240 hour(s))  Gram stain     Status: None   Collection Time: 03/03/22 10:03 AM   Specimen: Peritoneal Washings  Result Value Ref Range Status   Specimen Description PERITONEAL  Final   Special Requests NONE  Final   Gram Stain   Final    NO WBC SEEN NO ORGANISMS SEEN Performed at [facility], [address] [place], [address] [place]    Report Status 03/03/2022 FINAL  Final  Culture, body fluid w Gram Stain-bottle     Status: None (Preliminary result)   Collection Time: 03/03/22 10:03 AM   Specimen: Peritoneal Washings  Result Value Ref Range Status   Specimen Description PERITONEAL  Final   Special Requests NONE  Final   Culture   Final    NO GROWTH 3 DAYS Performed at [facility], [address] [place], [place] [address]    Report Status PENDING  Incomplete     Time spent in discharge (includes decision making & examination of pt): 35 minutes  03/06/2022, 12:02 PM   Lonia Blood, MD [facility] Office  [phone]

## 2022-03-06 NOTE — Plan of Care (Signed)
  Problem: Health Behavior/Discharge Planning: Goal: Ability to manage health-related needs will improve 03/06/2022 1237 by Anastasio Auerbach, RN Outcome: Completed/Met 03/06/2022 1213 by Anastasio Auerbach, RN Outcome: Progressing   Problem: Clinical Measurements: Goal: Ability to maintain clinical measurements within normal limits will improve 03/06/2022 1237 by Anastasio Auerbach, RN Outcome: Completed/Met 03/06/2022 1213 by Anastasio Auerbach, RN Outcome: Progressing Goal: Will remain free from infection 03/06/2022 1237 by Anastasio Auerbach, RN Outcome: Completed/Met 03/06/2022 1213 by Anastasio Auerbach, RN Outcome: Progressing Goal: Diagnostic test results will improve 03/06/2022 1237 by Anastasio Auerbach, RN Outcome: Completed/Met 03/06/2022 1213 by Anastasio Auerbach, RN Outcome: Progressing Goal: Cardiovascular complication will be avoided 03/06/2022 1237 by Anastasio Auerbach, RN Outcome: Completed/Met 03/06/2022 1213 by Anastasio Auerbach, RN Outcome: Progressing   Problem: Activity: Goal: Risk for activity intolerance will decrease 03/06/2022 1237 by Anastasio Auerbach, RN Outcome: Completed/Met 03/06/2022 1213 by Anastasio Auerbach, RN Outcome: Progressing   Problem: Nutrition: Goal: Adequate nutrition will be maintained 03/06/2022 1237 by Anastasio Auerbach, RN Outcome: Completed/Met 03/06/2022 1213 by Anastasio Auerbach, RN Outcome: Progressing   Problem: Coping: Goal: Level of anxiety will decrease 03/06/2022 1237 by Anastasio Auerbach, RN Outcome: Completed/Met 03/06/2022 1213 by Anastasio Auerbach, RN Outcome: Progressing   Problem: Elimination: Goal: Will not experience complications related to bowel motility 03/06/2022 1237 by Anastasio Auerbach, RN Outcome: Completed/Met 03/06/2022 1213 by Anastasio Auerbach, RN Outcome: Progressing Goal: Will not experience complications related to urinary retention 03/06/2022 1237 by Anastasio Auerbach, RN Outcome: Completed/Met 03/06/2022 1213 by Anastasio Auerbach, RN Outcome: Progressing   Problem: Pain  Managment: Goal: General experience of comfort will improve 03/06/2022 1237 by Anastasio Auerbach, RN Outcome: Completed/Met 03/06/2022 1213 by Anastasio Auerbach, RN Outcome: Progressing   Problem: Safety: Goal: Ability to remain free from injury will improve 03/06/2022 1237 by Anastasio Auerbach, RN Outcome: Completed/Met 03/06/2022 1213 by Anastasio Auerbach, RN Outcome: Progressing   Problem: Skin Integrity: Goal: Risk for impaired skin integrity will decrease 03/06/2022 1237 by Anastasio Auerbach, RN Outcome: Completed/Met 03/06/2022 1213 by Anastasio Auerbach, RN Outcome: Progressing

## 2022-03-07 LAB — CYTOLOGY - NON PAP

## 2022-03-08 LAB — CULTURE, BODY FLUID W GRAM STAIN -BOTTLE: Culture: NO GROWTH

## 2022-03-22 ENCOUNTER — Other Ambulatory Visit: Payer: Self-pay | Admitting: Gastroenterology

## 2022-03-22 ENCOUNTER — Other Ambulatory Visit (HOSPITAL_COMMUNITY): Payer: Self-pay | Admitting: Gastroenterology

## 2022-03-22 DIAGNOSIS — K7469 Other cirrhosis of liver: Secondary | ICD-10-CM

## 2022-03-22 DIAGNOSIS — R188 Other ascites: Secondary | ICD-10-CM

## 2022-03-26 ENCOUNTER — Encounter: Payer: Medicare Other | Admitting: Gastroenterology

## 2022-03-26 ENCOUNTER — Telehealth: Payer: Self-pay | Admitting: Gastroenterology

## 2022-03-26 NOTE — Telephone Encounter (Signed)
Patient was a no-show for colonoscopy today.  He was seen in the Transplant Hepatology clinic last week.  Can you please call to check in on him and reschedule his colonoscopy as part of his ongoing pretransplant work-up if he wishes to proceed.  Thanks.

## 2022-03-26 NOTE — Telephone Encounter (Signed)
Called patient at 1:44 -- No Answer-- unable to leave a message.

## 2022-03-27 NOTE — Telephone Encounter (Signed)
Inbound call from patient stating that he believed that he had missed his appointment. I advised him that he missed his colonoscopy that was scheduled for yesterday. Patient is requesting a call back to discuss and to follow up. Please advise.

## 2022-03-27 NOTE — Telephone Encounter (Signed)
Colonoscopy rescheduled for 04/30/22 at 2 pm. Previsit scheduled for 10/12 at 3 pm. Pt verbalized understanding and had no other concerns at end of call.

## 2022-03-28 ENCOUNTER — Encounter (HOSPITAL_COMMUNITY): Payer: Self-pay | Admitting: Emergency Medicine

## 2022-03-28 ENCOUNTER — Emergency Department (HOSPITAL_COMMUNITY)
Admission: EM | Admit: 2022-03-28 | Discharge: 2022-03-28 | Disposition: A | Discharge status: Home / Self Care | Payer: Medicare Other | Source: Home / Self Care | Attending: Emergency Medicine | Admitting: Emergency Medicine

## 2022-03-28 ENCOUNTER — Other Ambulatory Visit: Payer: Self-pay

## 2022-03-28 DIAGNOSIS — R10817 Generalized abdominal tenderness: Secondary | ICD-10-CM | POA: Insufficient documentation

## 2022-03-28 DIAGNOSIS — A419 Sepsis, unspecified organism: Secondary | ICD-10-CM | POA: Diagnosis not present

## 2022-03-28 DIAGNOSIS — R0602 Shortness of breath: Secondary | ICD-10-CM | POA: Insufficient documentation

## 2022-03-28 DIAGNOSIS — A4159 Other Gram-negative sepsis: Secondary | ICD-10-CM | POA: Diagnosis not present

## 2022-03-28 HISTORY — DX: Unspecified cirrhosis of liver: K74.60

## 2022-03-28 LAB — LIPASE, BLOOD: Lipase: 59 U/L — ABNORMAL HIGH (ref 11–51)

## 2022-03-28 LAB — PHOSPHORUS: Phosphorus: 4 mg/dL (ref 2.5–4.6)

## 2022-03-28 LAB — COMPREHENSIVE METABOLIC PANEL
ALT: 36 U/L (ref 0–44)
AST: 58 U/L — ABNORMAL HIGH (ref 15–41)
Albumin: 1.8 g/dL — ABNORMAL LOW (ref 3.5–5.0)
Alkaline Phosphatase: 135 U/L — ABNORMAL HIGH (ref 38–126)
Anion gap: 8 (ref 5–15)
BUN: 26 mg/dL — ABNORMAL HIGH (ref 6–20)
CO2: 26 mmol/L (ref 22–32)
Calcium: 8.1 mg/dL — ABNORMAL LOW (ref 8.9–10.3)
Chloride: 89 mmol/L — ABNORMAL LOW (ref 98–111)
Creatinine, Ser: 1.22 mg/dL (ref 0.61–1.24)
GFR, Estimated: >60 mL/min (ref >60)
Glucose, Bld: 120 mg/dL — ABNORMAL HIGH (ref 70–99)
Potassium: 4.1 mmol/L (ref 3.5–5.1)
Sodium: 123 mmol/L — ABNORMAL LOW (ref 135–145)
Total Bilirubin: 10.9 mg/dL — ABNORMAL HIGH (ref 0.3–1.2)
Total Protein: 6 g/dL — ABNORMAL LOW (ref 6.5–8.1)

## 2022-03-28 LAB — CBC WITH DIFFERENTIAL/PLATELET
Abs Immature Granulocytes: 0.03 10*3/uL (ref 0.00–0.07)
Basophils Absolute: 0.1 10*3/uL (ref 0.0–0.1)
Basophils Relative: 1 %
Eosinophils Absolute: 0.3 10*3/uL (ref 0.0–0.5)
Eosinophils Relative: 5 %
HCT: 26.3 % — ABNORMAL LOW (ref 39.0–52.0)
Hemoglobin: 9.5 g/dL — ABNORMAL LOW (ref 13.0–17.0)
Immature Granulocytes: 0 %
Lymphocytes Relative: 10 %
Lymphs Abs: 0.7 10*3/uL (ref 0.7–4.0)
MCH: 38.3 pg — ABNORMAL HIGH (ref 26.0–34.0)
MCHC: 36.1 g/dL — ABNORMAL HIGH (ref 30.0–36.0)
MCV: 106 fL — ABNORMAL HIGH (ref 80.0–100.0)
Monocytes Absolute: 1.3 10*3/uL — ABNORMAL HIGH (ref 0.1–1.0)
Monocytes Relative: 19 %
Neutro Abs: 4.3 10*3/uL (ref 1.7–7.7)
Neutrophils Relative %: 65 %
Platelets: 86 10*3/uL — ABNORMAL LOW (ref 150–400)
RBC: 2.48 MIL/uL — ABNORMAL LOW (ref 4.22–5.81)
RDW: 14.7 % (ref 11.5–15.5)
WBC: 6.7 10*3/uL (ref 4.0–10.5)
nRBC: 0 % (ref 0.0–0.2)

## 2022-03-28 LAB — BRAIN NATRIURETIC PEPTIDE: B Natriuretic Peptide: 22.3 pg/mL (ref 0.0–100.0)

## 2022-03-28 LAB — PROTIME-INR
INR: 2.1 — ABNORMAL HIGH (ref 0.8–1.2)
Prothrombin Time: 23.4 seconds — ABNORMAL HIGH (ref 11.4–15.2)

## 2022-03-28 LAB — MAGNESIUM: Magnesium: 2 mg/dL (ref 1.7–2.4)

## 2022-03-28 LAB — AMMONIA: Ammonia: 17 umol/L (ref 9–35)

## 2022-03-28 LAB — ETHANOL: Alcohol, Ethyl (B): <10 mg/dL (ref <10)

## 2022-03-28 NOTE — ED Triage Notes (Signed)
Pt. Stated, I have fluid over load , my stomach hurts some. Im feeling some fatigue. Hx of liver cirrhosis

## 2022-03-28 NOTE — ED Provider Notes (Signed)
[place] EMERGENCY DEPARTMENT Provider Note   CSN: [id number] Arrival date & time: 03/28/22  0755     History Chief Complaint  Patient presents with  . fluid over load  . Fatigue  . Abdominal Pain    HPI Jared Bass is a 51 y.o. male presenting for abdominal pain.  Patient has a history of cirrhosis requiring paracentesis in the outpatient setting.  He states that he feels volume overload and needs to have the fluid drained off. He denies any other symptoms at this time.  He denies fevers or chills nausea or vomiting, syncope shortness of breath.  He is otherwise ambulatory tolerating p.o. intake.    Patient's recorded medical, surgical, social, medication list and allergies were reviewed in the Snapshot window as part of the initial history.   Review of Systems   Review of Systems  Constitutional:  Negative for chills and fever.  HENT:  Negative for ear pain and sore throat.   Eyes:  Negative for pain and visual disturbance.  Respiratory:  Negative for cough and shortness of breath.   Cardiovascular:  Negative for chest pain and palpitations.  Gastrointestinal:  Positive for abdominal distention and abdominal pain. Negative for vomiting.  Genitourinary:  Negative for dysuria and hematuria.  Musculoskeletal:  Negative for arthralgias and back pain.  Skin:  Negative for color change and rash.  Neurological:  Negative for seizures and syncope.  All other systems reviewed and are negative.   Physical Exam Updated Vital Signs BP (!) 130/90 (BP Location: Right Arm)   Pulse (!) 107   Temp 98.2 F (36.8 C) (Oral)   Resp 18   Ht 5\' 8"  (1.727 m)   Wt 97.5 kg   SpO2 97%   BMI 32.69 kg/m  Physical Exam Vitals and nursing note reviewed.  Constitutional:      General: He is not in acute distress.    Appearance: He is well-developed.  HENT:     Head: Normocephalic and atraumatic.  Eyes:     Conjunctiva/sclera: Conjunctivae normal.  Cardiovascular:      Rate and Rhythm: Normal rate and regular rhythm.     Heart sounds: No murmur heard. Pulmonary:     Effort: Pulmonary effort is normal. No respiratory distress.     Breath sounds: Normal breath sounds.  Abdominal:     General: Bowel sounds are normal.     Palpations: Abdomen is soft. There is shifting dullness.     Tenderness: There is generalized abdominal tenderness.  Musculoskeletal:        General: No swelling.     Cervical back: Neck supple.  Skin:    General: Skin is warm and dry.     Capillary Refill: Capillary refill takes less than 2 seconds.  Neurological:     Mental Status: He is alert.  Psychiatric:        Mood and Affect: Mood normal.      ED Course/ Medical Decision Making/ A&P Clinical Course as of 03/28/22 1549  Wed Mar 28, 2022  1547 Glucose(!): 120 [CC]    Clinical Course User Index [CC] Tretha Sciara, MD    Procedures Procedures   Medications Ordered in ED Medications - No data to display  Medical Decision Making:    Jared Bass is a 51 y.o. male who presented to the ED today with abdominal pain and distention detailed above.     Patient's presentation is complicated by their history of multiple comorbid medical problems.  Patient placed on continuous vitals and telemetry monitoring while in ED which was reviewed periodically.   Complete initial physical exam performed, notably the patient  was HDS in NAD. Large visible ascitis.      Reviewed and confirmed nursing documentation for past medical history, family history, social history.    Initial Assessment:   With the patient's presentation of abdominal distention with discomfort, most likely diagnosis is continued decompensation of his alcoholic cirrhosis with developing ascites, likely large volume. Other diagnoses were considered including (but not limited to) emergency department. These are considered less likely due to history of present illness and physical exam findings.   This is  most consistent with an acute life/limb threatening illness complicated by underlying chronic conditions.  Initial Plan:  When I discussed what a emergency department therapeutic paracentesis may look like including limitations of volume given emergency department setting, patient states he would prefer to call his liver transplant coordinator and arrange an outpatient paracentesis if able as he did not realize emergency department had limitations on quantity of paracentesis. Informed patient that since I was not able to monitor his albumin and give albumin back in the emergency department setting, would be limited to 2 L of therapeutic paracentesis.  Offered diagnostic paracentesis as well, however patient will defer to talk to his coordinator at this time.  Screening labs including CBC and Metabolic panel to evaluate for infectious or metabolic etiology of disease.  Urinalysis with reflex culture ordered to evaluate for UTI or relevant urologic/nephrologic pathology.  Objective evaluation as below reviewed with plan for close reassessment  Initial Study Results:   Laboratory  Multiple chronic abnormalities, evidence of cirrhosis with LFT abnormalities, bilirubin increased consistent with known jaundice EKG EKG was reviewed independently. Rate, rhythm, axis, intervals all examined and without medically relevant abnormality. ST segments without concerns for elevations.   Final Assessment and Plan:   I reevaluated patient at bedside.  He has multiple chronic appearing abnormalities.  I recommended paracentesis for diagnostic and therapeutic purposes.  However patient refused.  He states that he has called his liver transplant coordinator who is going to arrange a full therapeutic paracentesis outpatient.  He states he hates needles and would rather only get stuck once this week.  Overall this is reasonable as he is ambulatory tolerating p.o. intake and overall well-appearing.  Patient requesting  discharge at this time, able to verbalize his reasons for not wanting to undergo further medical work-up at this time.  This is reasonable and patient will follow-up in the outpatient setting gastroenterology.    Clinical Impression:  1. SOB (shortness of breath)      Discharge   Final Clinical Impression(s) / ED Diagnoses Final diagnoses:  SOB (shortness of breath)    Rx / DC Orders ED Discharge Orders     None         Tretha Sciara, MD 03/28/22 1549

## 2022-03-28 NOTE — Discharge Instructions (Addendum)
You are seen today for ascites likely from your decompensated liver failure.  We offered you a paracentesis, however you would prefer to follow-up with your outpatient provider and get an outpatient paracentesis large-volume.  This is reasonable as you are otherwise appear well.  However if you develop fevers or worsening abdominal pain, we recommend you return for immediate therapeutic paracentesis.

## 2022-03-28 NOTE — ED Notes (Signed)
All care rendered by provider. 

## 2022-03-29 ENCOUNTER — Inpatient Hospital Stay (HOSPITAL_COMMUNITY)
Admission: EM | Admit: 2022-03-29 | Discharge: 2022-04-01 | DRG: 871 | Disposition: E | Payer: Medicare Other | Attending: Emergency Medicine | Admitting: Emergency Medicine

## 2022-03-29 ENCOUNTER — Encounter (HOSPITAL_COMMUNITY): Payer: Self-pay | Admitting: Emergency Medicine

## 2022-03-29 ENCOUNTER — Other Ambulatory Visit: Payer: Self-pay

## 2022-03-29 ENCOUNTER — Emergency Department (HOSPITAL_COMMUNITY): Payer: Medicare Other

## 2022-03-29 ENCOUNTER — Inpatient Hospital Stay (HOSPITAL_COMMUNITY): Payer: Medicare Other

## 2022-03-29 DIAGNOSIS — A4159 Other Gram-negative sepsis: Principal | ICD-10-CM | POA: Diagnosis present

## 2022-03-29 DIAGNOSIS — B961 Klebsiella pneumoniae [K. pneumoniae] as the cause of diseases classified elsewhere: Secondary | ICD-10-CM | POA: Diagnosis present

## 2022-03-29 DIAGNOSIS — F1120 Opioid dependence, uncomplicated: Secondary | ICD-10-CM | POA: Diagnosis present

## 2022-03-29 DIAGNOSIS — Z825 Family history of asthma and other chronic lower respiratory diseases: Secondary | ICD-10-CM

## 2022-03-29 DIAGNOSIS — R7881 Bacteremia: Secondary | ICD-10-CM | POA: Diagnosis present

## 2022-03-29 DIAGNOSIS — K7040 Alcoholic hepatic failure without coma: Secondary | ICD-10-CM | POA: Diagnosis present

## 2022-03-29 DIAGNOSIS — G9341 Metabolic encephalopathy: Secondary | ICD-10-CM | POA: Diagnosis present

## 2022-03-29 DIAGNOSIS — E162 Hypoglycemia, unspecified: Secondary | ICD-10-CM | POA: Diagnosis present

## 2022-03-29 DIAGNOSIS — Z86711 Personal history of pulmonary embolism: Secondary | ICD-10-CM

## 2022-03-29 DIAGNOSIS — R0600 Dyspnea, unspecified: Secondary | ICD-10-CM | POA: Diagnosis present

## 2022-03-29 DIAGNOSIS — N179 Acute kidney failure, unspecified: Secondary | ICD-10-CM | POA: Diagnosis present

## 2022-03-29 DIAGNOSIS — E43 Unspecified severe protein-calorie malnutrition: Secondary | ICD-10-CM | POA: Diagnosis present

## 2022-03-29 DIAGNOSIS — J90 Pleural effusion, not elsewhere classified: Secondary | ICD-10-CM | POA: Diagnosis present

## 2022-03-29 DIAGNOSIS — K766 Portal hypertension: Secondary | ICD-10-CM | POA: Diagnosis present

## 2022-03-29 DIAGNOSIS — R579 Shock, unspecified: Secondary | ICD-10-CM

## 2022-03-29 DIAGNOSIS — J9811 Atelectasis: Secondary | ICD-10-CM | POA: Diagnosis present

## 2022-03-29 DIAGNOSIS — I10 Essential (primary) hypertension: Secondary | ICD-10-CM | POA: Diagnosis present

## 2022-03-29 DIAGNOSIS — R601 Generalized edema: Secondary | ICD-10-CM | POA: Diagnosis not present

## 2022-03-29 DIAGNOSIS — Z885 Allergy status to narcotic agent status: Secondary | ICD-10-CM

## 2022-03-29 DIAGNOSIS — Z66 Do not resuscitate: Secondary | ICD-10-CM | POA: Diagnosis present

## 2022-03-29 DIAGNOSIS — Z6832 Body mass index (BMI) 32.0-32.9, adult: Secondary | ICD-10-CM | POA: Diagnosis not present

## 2022-03-29 DIAGNOSIS — E872 Acidosis, unspecified: Secondary | ICD-10-CM | POA: Diagnosis present

## 2022-03-29 DIAGNOSIS — K652 Spontaneous bacterial peritonitis: Secondary | ICD-10-CM | POA: Diagnosis present

## 2022-03-29 DIAGNOSIS — Z8249 Family history of ischemic heart disease and other diseases of the circulatory system: Secondary | ICD-10-CM

## 2022-03-29 DIAGNOSIS — K219 Gastro-esophageal reflux disease without esophagitis: Secondary | ICD-10-CM | POA: Diagnosis present

## 2022-03-29 DIAGNOSIS — I8510 Secondary esophageal varices without bleeding: Secondary | ICD-10-CM | POA: Diagnosis present

## 2022-03-29 DIAGNOSIS — Z20822 Contact with and (suspected) exposure to covid-19: Secondary | ICD-10-CM | POA: Diagnosis present

## 2022-03-29 DIAGNOSIS — R6521 Severe sepsis with septic shock: Secondary | ICD-10-CM

## 2022-03-29 DIAGNOSIS — K7031 Alcoholic cirrhosis of liver with ascites: Secondary | ICD-10-CM | POA: Diagnosis present

## 2022-03-29 DIAGNOSIS — D696 Thrombocytopenia, unspecified: Secondary | ICD-10-CM | POA: Diagnosis present

## 2022-03-29 DIAGNOSIS — Z981 Arthrodesis status: Secondary | ICD-10-CM

## 2022-03-29 DIAGNOSIS — Z79899 Other long term (current) drug therapy: Secondary | ICD-10-CM

## 2022-03-29 DIAGNOSIS — E871 Hypo-osmolality and hyponatremia: Secondary | ICD-10-CM | POA: Diagnosis present

## 2022-03-29 DIAGNOSIS — D689 Coagulation defect, unspecified: Secondary | ICD-10-CM | POA: Diagnosis present

## 2022-03-29 DIAGNOSIS — E8770 Fluid overload, unspecified: Secondary | ICD-10-CM | POA: Diagnosis present

## 2022-03-29 DIAGNOSIS — K3189 Other diseases of stomach and duodenum: Secondary | ICD-10-CM | POA: Diagnosis present

## 2022-03-29 DIAGNOSIS — N4889 Other specified disorders of penis: Secondary | ICD-10-CM | POA: Diagnosis present

## 2022-03-29 DIAGNOSIS — R34 Anuria and oliguria: Secondary | ICD-10-CM | POA: Diagnosis present

## 2022-03-29 DIAGNOSIS — I4891 Unspecified atrial fibrillation: Secondary | ICD-10-CM | POA: Diagnosis present

## 2022-03-29 DIAGNOSIS — Z515 Encounter for palliative care: Secondary | ICD-10-CM | POA: Diagnosis not present

## 2022-03-29 DIAGNOSIS — F411 Generalized anxiety disorder: Secondary | ICD-10-CM | POA: Diagnosis present

## 2022-03-29 DIAGNOSIS — D61818 Other pancytopenia: Secondary | ICD-10-CM | POA: Diagnosis present

## 2022-03-29 DIAGNOSIS — J9601 Acute respiratory failure with hypoxia: Secondary | ICD-10-CM | POA: Diagnosis present

## 2022-03-29 DIAGNOSIS — N5089 Other specified disorders of the male genital organs: Secondary | ICD-10-CM | POA: Diagnosis present

## 2022-03-29 DIAGNOSIS — A419 Sepsis, unspecified organism: Principal | ICD-10-CM | POA: Diagnosis present

## 2022-03-29 DIAGNOSIS — J811 Chronic pulmonary edema: Secondary | ICD-10-CM | POA: Diagnosis present

## 2022-03-29 DIAGNOSIS — Z87828 Personal history of other (healed) physical injury and trauma: Secondary | ICD-10-CM

## 2022-03-29 DIAGNOSIS — R1084 Generalized abdominal pain: Secondary | ICD-10-CM | POA: Diagnosis present

## 2022-03-29 DIAGNOSIS — Z9109 Other allergy status, other than to drugs and biological substances: Secondary | ICD-10-CM

## 2022-03-29 DIAGNOSIS — D6959 Other secondary thrombocytopenia: Secondary | ICD-10-CM | POA: Diagnosis present

## 2022-03-29 DIAGNOSIS — Z7682 Awaiting organ transplant status: Secondary | ICD-10-CM

## 2022-03-29 HISTORY — DX: Unspecified cirrhosis of liver: K74.60

## 2022-03-29 LAB — POCT I-STAT 7, (LYTES, BLD GAS, ICA,H+H)
Acid-base deficit: 7 mmol/L — ABNORMAL HIGH (ref 0.0–2.0)
Acid-base deficit: 12 mmol/L — ABNORMAL HIGH (ref 0.0–2.0)
Bicarbonate: 21.8 mmol/L (ref 20.0–28.0)
Bicarbonate: 15 mmol/L — ABNORMAL LOW (ref 20.0–28.0)
Calcium, Ion: 1.13 mmol/L — ABNORMAL LOW (ref 1.15–1.40)
Calcium, Ion: 1.11 mmol/L — ABNORMAL LOW (ref 1.15–1.40)
HCT: 23 % — ABNORMAL LOW (ref 39.0–52.0)
HCT: 22 % — ABNORMAL LOW (ref 39.0–52.0)
Hemoglobin: 7.8 g/dL — ABNORMAL LOW (ref 13.0–17.0)
Hemoglobin: 7.5 g/dL — ABNORMAL LOW (ref 13.0–17.0)
O2 Saturation: 89 %
O2 Saturation: 83 %
Patient temperature: 94.4
Patient temperature: 93.6
Potassium: 4.1 mmol/L (ref 3.5–5.1)
Potassium: 4.3 mmol/L (ref 3.5–5.1)
Sodium: 125 mmol/L — ABNORMAL LOW (ref 135–145)
Sodium: 123 mmol/L — ABNORMAL LOW (ref 135–145)
TCO2: 24 mmol/L (ref 22–32)
TCO2: 16 mmol/L — ABNORMAL LOW (ref 22–32)
pCO2 arterial: 55.6 mmHg — ABNORMAL HIGH (ref 32–48)
pCO2 arterial: 32.3 mmHg (ref 32–48)
pH, Arterial: 7.188 — CL (ref 7.35–7.45)
pH, Arterial: 7.26 — ABNORMAL LOW (ref 7.35–7.45)
pO2, Arterial: 64 mmHg — ABNORMAL LOW (ref 83–108)
pO2, Arterial: 47 mmHg — ABNORMAL LOW (ref 83–108)

## 2022-03-29 LAB — PROTEIN, PLEURAL OR PERITONEAL FLUID: Total protein, fluid: <3 g/dL

## 2022-03-29 LAB — GLUCOSE, CAPILLARY
Glucose-Capillary: 13 mg/dL — CL (ref 70–99)
Glucose-Capillary: 15 mg/dL — CL (ref 70–99)
Glucose-Capillary: 82 mg/dL (ref 70–99)
Glucose-Capillary: 47 mg/dL — ABNORMAL LOW (ref 70–99)
Glucose-Capillary: 55 mg/dL — ABNORMAL LOW (ref 70–99)
Glucose-Capillary: 72 mg/dL (ref 70–99)
Glucose-Capillary: 80 mg/dL (ref 70–99)
Glucose-Capillary: 69 mg/dL — ABNORMAL LOW (ref 70–99)
Glucose-Capillary: 68 mg/dL — ABNORMAL LOW (ref 70–99)
Glucose-Capillary: 95 mg/dL (ref 70–99)
Glucose-Capillary: 163 mg/dL — ABNORMAL HIGH (ref 70–99)
Glucose-Capillary: 58 mg/dL — ABNORMAL LOW (ref 70–99)

## 2022-03-29 LAB — COMPREHENSIVE METABOLIC PANEL
ALT: 30 U/L (ref 0–44)
AST: 61 U/L — ABNORMAL HIGH (ref 15–41)
Albumin: 1.6 g/dL — ABNORMAL LOW (ref 3.5–5.0)
Alkaline Phosphatase: 109 U/L (ref 38–126)
Anion gap: 12 (ref 5–15)
BUN: 33 mg/dL — ABNORMAL HIGH (ref 6–20)
CO2: 18 mmol/L — ABNORMAL LOW (ref 22–32)
Calcium: 7.9 mg/dL — ABNORMAL LOW (ref 8.9–10.3)
Chloride: 90 mmol/L — ABNORMAL LOW (ref 98–111)
Creatinine, Ser: 1.98 mg/dL — ABNORMAL HIGH (ref 0.61–1.24)
GFR, Estimated: 40 mL/min — ABNORMAL LOW (ref >60)
Glucose, Bld: 39 mg/dL — CL (ref 70–99)
Potassium: 4.4 mmol/L (ref 3.5–5.1)
Sodium: 120 mmol/L — ABNORMAL LOW (ref 135–145)
Total Bilirubin: 10.6 mg/dL — ABNORMAL HIGH (ref 0.3–1.2)
Total Protein: 5.1 g/dL — ABNORMAL LOW (ref 6.5–8.1)

## 2022-03-29 LAB — BLOOD CULTURE ID PANEL (REFLEXED) - BCID2

## 2022-03-29 LAB — CBC WITH DIFFERENTIAL/PLATELET
Abs Immature Granulocytes: 0.01 10*3/uL (ref 0.00–0.07)
Basophils Absolute: 0 10*3/uL (ref 0.0–0.1)
Basophils Relative: 1 %
Eosinophils Absolute: 0.1 10*3/uL (ref 0.0–0.5)
Eosinophils Relative: 4 %
HCT: 26 % — ABNORMAL LOW (ref 39.0–52.0)
Hemoglobin: 9.2 g/dL — ABNORMAL LOW (ref 13.0–17.0)
Immature Granulocytes: 1 %
Lymphocytes Relative: 16 %
Lymphs Abs: 0.3 10*3/uL — ABNORMAL LOW (ref 0.7–4.0)
MCH: 38 pg — ABNORMAL HIGH (ref 26.0–34.0)
MCHC: 35.4 g/dL (ref 30.0–36.0)
MCV: 107.4 fL — ABNORMAL HIGH (ref 80.0–100.0)
Monocytes Absolute: 0.1 10*3/uL (ref 0.1–1.0)
Monocytes Relative: 7 %
Neutro Abs: 1.2 10*3/uL — ABNORMAL LOW (ref 1.7–7.7)
Neutrophils Relative %: 71 %
Platelets: 79 10*3/uL — ABNORMAL LOW (ref 150–400)
RBC: 2.42 MIL/uL — ABNORMAL LOW (ref 4.22–5.81)
RDW: 14.6 % (ref 11.5–15.5)
WBC: 1.7 10*3/uL — ABNORMAL LOW (ref 4.0–10.5)
nRBC: 1.8 % — ABNORMAL HIGH (ref 0.0–0.2)

## 2022-03-29 LAB — URINALYSIS, ROUTINE W REFLEX MICROSCOPIC
Bilirubin Urine: NEGATIVE
Glucose, UA: NEGATIVE mg/dL
Hgb urine dipstick: NEGATIVE
Ketones, ur: NEGATIVE mg/dL
Nitrite: NEGATIVE
Protein, ur: NEGATIVE mg/dL
Specific Gravity, Urine: 1.035 — ABNORMAL HIGH (ref 1.005–1.030)
pH: 5 (ref 5.0–8.0)

## 2022-03-29 LAB — BASIC METABOLIC PANEL
Anion gap: 15 (ref 5–15)
BUN: 35 mg/dL — ABNORMAL HIGH (ref 6–20)
CO2: 17 mmol/L — ABNORMAL LOW (ref 22–32)
Calcium: 7.7 mg/dL — ABNORMAL LOW (ref 8.9–10.3)
Chloride: 91 mmol/L — ABNORMAL LOW (ref 98–111)
Creatinine, Ser: 1.74 mg/dL — ABNORMAL HIGH (ref 0.61–1.24)
GFR, Estimated: 46 mL/min — ABNORMAL LOW (ref >60)
Glucose, Bld: 77 mg/dL (ref 70–99)
Potassium: 4.2 mmol/L (ref 3.5–5.1)
Sodium: 123 mmol/L — ABNORMAL LOW (ref 135–145)

## 2022-03-29 LAB — RESP PANEL BY RT-PCR (FLU A&B, COVID) ARPGX2
Influenza A by PCR: NEGATIVE
Influenza B by PCR: NEGATIVE
SARS Coronavirus 2 by RT PCR: NEGATIVE

## 2022-03-29 LAB — PROCALCITONIN: Procalcitonin: 8.16 ng/mL

## 2022-03-29 LAB — GLUCOSE, PLEURAL OR PERITONEAL FLUID: Glucose, Fluid: 41 mg/dL

## 2022-03-29 LAB — BRAIN NATRIURETIC PEPTIDE: B Natriuretic Peptide: 54.3 pg/mL (ref 0.0–100.0)

## 2022-03-29 LAB — LACTIC ACID, PLASMA
Lactic Acid, Venous: 4 mmol/L (ref 0.5–1.9)
Lactic Acid, Venous: 4 mmol/L (ref 0.5–1.9)
Lactic Acid, Venous: 4.2 mmol/L (ref 0.5–1.9)
Lactic Acid, Venous: 4 mmol/L (ref 0.5–1.9)

## 2022-03-29 LAB — TROPONIN I (HIGH SENSITIVITY)
Troponin I (High Sensitivity): 15 ng/L (ref <18)
Troponin I (High Sensitivity): 12 ng/L (ref <18)

## 2022-03-29 LAB — BODY FLUID CELL COUNT WITH DIFFERENTIAL
Eos, Fluid: 0 %
Lymphs, Fluid: 5 %
Monocyte-Macrophage-Serous Fluid: 6 % — ABNORMAL LOW (ref 50–90)
Neutrophil Count, Fluid: 89 % — ABNORMAL HIGH (ref 0–25)
Total Nucleated Cell Count, Fluid: 520 cu mm (ref 0–1000)

## 2022-03-29 LAB — CBG MONITORING, ED
Glucose-Capillary: 218 mg/dL — ABNORMAL HIGH (ref 70–99)
Glucose-Capillary: 24 mg/dL — CL (ref 70–99)

## 2022-03-29 LAB — CORTISOL: Cortisol, Plasma: 63.6 ug/dL

## 2022-03-29 LAB — APTT: aPTT: 51 seconds — ABNORMAL HIGH (ref 24–36)

## 2022-03-29 LAB — LACTATE DEHYDROGENASE, PLEURAL OR PERITONEAL FLUID: LD, Fluid: 63 U/L — ABNORMAL HIGH (ref 3–23)

## 2022-03-29 LAB — PROTIME-INR
INR: 2.2 — ABNORMAL HIGH (ref 0.8–1.2)
Prothrombin Time: 24.4 seconds — ABNORMAL HIGH (ref 11.4–15.2)

## 2022-03-29 LAB — ALBUMIN, PLEURAL OR PERITONEAL FLUID: Albumin, Fluid: <1.5 g/dL

## 2022-03-29 LAB — MRSA NEXT GEN BY PCR, NASAL: MRSA by PCR Next Gen: DETECTED — AB

## 2022-03-29 LAB — MAGNESIUM: Magnesium: 1.7 mg/dL (ref 1.7–2.4)

## 2022-03-29 LAB — AMMONIA: Ammonia: 29 umol/L (ref 9–35)

## 2022-03-29 MED ORDER — PANTOPRAZOLE SODIUM 40 MG PO TBEC
40.0000 mg | DELAYED_RELEASE_TABLET | Freq: Every day | ORAL | Status: DC
  Filled 2022-03-29: qty 1

## 2022-03-29 MED ORDER — DEXTROSE 50 % IV SOLN
INTRAVENOUS | Status: AC
  Administered 2022-03-29: 50 mL
  Filled 2022-03-29: qty 50

## 2022-03-29 MED ORDER — ROCURONIUM BROMIDE 10 MG/ML (PF) SYRINGE: 100.0000 mg | PREFILLED_SYRINGE | Freq: Once | INTRAVENOUS | Status: AC

## 2022-03-29 MED ORDER — SODIUM CHLORIDE 0.9 % IV SOLN
2.0000 g | Freq: Once | INTRAVENOUS | Status: AC
  Administered 2022-03-29: 2 g via INTRAVENOUS
  Filled 2022-03-29: qty 20

## 2022-03-29 MED ORDER — VASOPRESSIN 20 UNITS/100 ML INFUSION FOR SHOCK
0.0000 [IU]/min | INTRAVENOUS | Status: DC
  Administered 2022-03-29 – 2022-03-30 (×4): 0.04 [IU]/min via INTRAVENOUS
  Filled 2022-03-29 (×3): qty 100

## 2022-03-29 MED ORDER — DOCUSATE SODIUM 100 MG PO CAPS: 100.0000 mg | ORAL_CAPSULE | Freq: Two times a day (BID) | ORAL | Status: DC | PRN

## 2022-03-29 MED ORDER — ALBUMIN HUMAN 25 % IV SOLN
150.0000 g | Freq: Once | INTRAVENOUS | Status: AC
  Administered 2022-03-29: 150 g via INTRAVENOUS
  Filled 2022-03-29: qty 600

## 2022-03-29 MED ORDER — OXYCODONE HCL 5 MG PO TABS
5.0000 mg | ORAL_TABLET | Freq: Four times a day (QID) | ORAL | Status: DC | PRN
  Administered 2022-03-29: 5 mg via ORAL
  Filled 2022-03-29: qty 1

## 2022-03-29 MED ORDER — VASOPRESSIN 20 UNITS/100 ML INFUSION FOR SHOCK
INTRAVENOUS | Status: AC
  Administered 2022-03-29: 20 [IU]
  Filled 2022-03-29: qty 100

## 2022-03-29 MED ORDER — PANTOPRAZOLE 2 MG/ML SUSPENSION: 40.0000 mg | Freq: Every day | ORAL | Status: DC

## 2022-03-29 MED ORDER — IOHEXOL 350 MG/ML SOLN
100.0000 mL | Freq: Once | INTRAVENOUS | Status: AC | PRN
  Administered 2022-03-29: 75 mL via INTRAVENOUS

## 2022-03-29 MED ORDER — FENTANYL CITRATE PF 50 MCG/ML IJ SOSY
PREFILLED_SYRINGE | INTRAMUSCULAR | Status: AC
  Administered 2022-03-29: 50 ug
  Filled 2022-03-29: qty 2

## 2022-03-29 MED ORDER — NOREPINEPHRINE 4 MG/250ML-% IV SOLN
INTRAVENOUS | Status: AC
  Administered 2022-03-29: 4 mg
  Filled 2022-03-29: qty 250

## 2022-03-29 MED ORDER — SODIUM BICARBONATE 8.4 % IV SOLN
INTRAVENOUS | Status: AC
  Filled 2022-03-29: qty 100

## 2022-03-29 MED ORDER — SODIUM CHLORIDE 0.9 % IV BOLUS (SEPSIS)
1000.0000 mL | Freq: Once | INTRAVENOUS | Status: AC
  Administered 2022-03-29: 1000 mL via INTRAVENOUS

## 2022-03-29 MED ORDER — SODIUM BICARBONATE 8.4 % IV SOLN: 100.0000 meq | Freq: Once | INTRAVENOUS | Status: AC

## 2022-03-29 MED ORDER — NOREPINEPHRINE 16 MG/250ML-% IV SOLN
0.0000 ug/min | INTRAVENOUS | Status: DC
  Administered 2022-03-29: 4 ug/min via INTRAVENOUS
  Administered 2022-03-29: 40 ug/min via INTRAVENOUS
  Administered 2022-03-30: 70 ug/min via INTRAVENOUS
  Administered 2022-03-30: 80 ug/min via INTRAVENOUS
  Administered 2022-03-30: 70 ug/min via INTRAVENOUS
  Filled 2022-03-29 (×5): qty 250

## 2022-03-29 MED ORDER — SODIUM BICARBONATE 8.4 % IV SOLN
INTRAVENOUS | Status: AC
  Administered 2022-03-29: 100 meq via INTRAVENOUS
  Filled 2022-03-29: qty 100

## 2022-03-29 MED ORDER — LACTATED RINGERS IV BOLUS (SEPSIS)
1000.0000 mL | Freq: Once | INTRAVENOUS | Status: AC
  Administered 2022-03-29: 1000 mL via INTRAVENOUS

## 2022-03-29 MED ORDER — SODIUM CHLORIDE 0.9 % IV SOLN
1.0000 g | Freq: Three times a day (TID) | INTRAVENOUS | Status: DC
  Administered 2022-03-29 – 2022-03-30 (×2): 1 g via INTRAVENOUS
  Filled 2022-03-29 (×2): qty 20

## 2022-03-29 MED ORDER — DEXTROSE 50 % IV SOLN
25.0000 mL | Freq: Once | INTRAVENOUS | Status: AC
  Administered 2022-03-29: 25 mL via INTRAVENOUS
  Filled 2022-03-29: qty 50

## 2022-03-29 MED ORDER — HYDROCORTISONE SOD SUC (PF) 100 MG IJ SOLR
100.0000 mg | Freq: Two times a day (BID) | INTRAMUSCULAR | Status: DC
  Administered 2022-03-29 – 2022-03-30 (×2): 100 mg via INTRAVENOUS
  Filled 2022-03-29 (×4): qty 2

## 2022-03-29 MED ORDER — FENTANYL CITRATE PF 50 MCG/ML IJ SOSY
25.0000 ug | PREFILLED_SYRINGE | Freq: Once | INTRAMUSCULAR | Status: AC
  Administered 2022-03-29: 25 ug via INTRAVENOUS
  Filled 2022-03-29: qty 1

## 2022-03-29 MED ORDER — SODIUM BICARBONATE 8.4 % IV SOLN
100.0000 meq | Freq: Once | INTRAVENOUS | Status: AC
  Administered 2022-03-30: 100 meq via INTRAVENOUS

## 2022-03-29 MED ORDER — DEXTROSE IN LACTATED RINGERS 5 % IV SOLN: INTRAVENOUS | Status: DC

## 2022-03-29 MED ORDER — DEXTROSE 50 % IV SOLN
25.0000 mL | Freq: Once | INTRAVENOUS | Status: AC
  Administered 2022-03-29: 25 mL via INTRAVENOUS

## 2022-03-29 MED ORDER — FENTANYL CITRATE PF 50 MCG/ML IJ SOSY: 50.0000 ug | PREFILLED_SYRINGE | Freq: Once | INTRAMUSCULAR | Status: AC

## 2022-03-29 MED ORDER — DEXTROSE 50 % IV SOLN: 25.0000 g | INTRAVENOUS | Status: AC

## 2022-03-29 MED ORDER — CHLORHEXIDINE GLUCONATE CLOTH 2 % EX PADS
6.0000 | MEDICATED_PAD | Freq: Every day | CUTANEOUS | Status: DC
  Administered 2022-03-30: 6 via TOPICAL

## 2022-03-29 MED ORDER — DOCUSATE SODIUM 50 MG/5ML PO LIQD
100.0000 mg | Freq: Two times a day (BID) | ORAL | Status: DC
  Filled 2022-03-29: qty 10

## 2022-03-29 MED ORDER — ROCURONIUM BROMIDE 10 MG/ML (PF) SYRINGE
PREFILLED_SYRINGE | INTRAVENOUS | Status: AC
  Administered 2022-03-29: 100 mg
  Filled 2022-03-29: qty 10

## 2022-03-29 MED ORDER — DEXTROSE 50 % IV SOLN
INTRAVENOUS | Status: AC
  Filled 2022-03-29: qty 50

## 2022-03-29 MED ORDER — SODIUM CHLORIDE 0.9 % IV BOLUS
500.0000 mL | Freq: Once | INTRAVENOUS | Status: AC
  Administered 2022-03-29: 500 mL via INTRAVENOUS

## 2022-03-29 MED ORDER — MIDAZOLAM HCL 2 MG/2ML IJ SOLN
INTRAMUSCULAR | Status: AC
  Filled 2022-03-29: qty 2

## 2022-03-29 MED ORDER — DEXTROSE 10 % IV SOLN: INTRAVENOUS | Status: DC

## 2022-03-29 MED ORDER — MIDAZOLAM HCL 2 MG/2ML IJ SOLN: 2.0000 mg | Freq: Once | INTRAMUSCULAR | Status: DC

## 2022-03-29 MED ORDER — EPINEPHRINE HCL 5 MG/250ML IV SOLN IN NS
0.5000 ug/min | INTRAVENOUS | Status: DC
  Administered 2022-03-29: 0.5 ug/min via INTRAVENOUS
  Administered 2022-03-30: 20 ug/min via INTRAVENOUS
  Administered 2022-03-30: 12 ug/min via INTRAVENOUS
  Filled 2022-03-29 (×3): qty 250

## 2022-03-29 MED ORDER — ORAL CARE MOUTH RINSE: 15.0000 mL | OROMUCOSAL | Status: DC | PRN

## 2022-03-29 MED ORDER — FENTANYL CITRATE PF 50 MCG/ML IJ SOSY
50.0000 ug | PREFILLED_SYRINGE | INTRAMUSCULAR | Status: DC | PRN
  Administered 2022-03-29: 50 ug via INTRAVENOUS
  Filled 2022-03-29: qty 1

## 2022-03-29 MED ORDER — POLYETHYLENE GLYCOL 3350 17 G PO PACK: 17.0000 g | PACK | Freq: Every day | ORAL | Status: DC | PRN

## 2022-03-29 MED ORDER — VANCOMYCIN VARIABLE DOSE PER UNSTABLE RENAL FUNCTION (PHARMACIST DOSING): Status: DC

## 2022-03-29 MED ORDER — LIDOCAINE HCL URETHRAL/MUCOSAL 2 % EX GEL
1.0000 | Freq: Once | CUTANEOUS | Status: DC
  Filled 2022-03-29: qty 6

## 2022-03-29 MED ORDER — POLYETHYLENE GLYCOL 3350 17 G PO PACK: 17.0000 g | PACK | Freq: Every day | ORAL | Status: DC

## 2022-03-29 MED ORDER — DEXTROSE 50 % IV SOLN: 12.5000 g | INTRAVENOUS | Status: AC

## 2022-03-29 MED ORDER — FENTANYL CITRATE PF 50 MCG/ML IJ SOSY
50.0000 ug | PREFILLED_SYRINGE | INTRAMUSCULAR | Status: DC | PRN
  Administered 2022-03-29: 50 ug via INTRAVENOUS
  Administered 2022-03-29: 100 ug via INTRAVENOUS
  Filled 2022-03-29: qty 1
  Filled 2022-03-29: qty 2

## 2022-03-29 MED ORDER — HEPARIN SODIUM (PORCINE) 5000 UNIT/ML IJ SOLN
5000.0000 [IU] | Freq: Three times a day (TID) | INTRAMUSCULAR | Status: DC
  Administered 2022-03-29 – 2022-03-30 (×4): 5000 [IU] via SUBCUTANEOUS
  Filled 2022-03-29 (×4): qty 1

## 2022-03-29 MED ORDER — FLUDROCORTISONE ACETATE 0.1 MG PO TABS
0.1000 mg | ORAL_TABLET | Freq: Every day | ORAL | Status: DC
  Administered 2022-03-29: 0.1 mg
  Filled 2022-03-29 (×2): qty 1

## 2022-03-29 MED ORDER — SODIUM CHLORIDE 0.9 % IV SOLN
2.0000 g | Freq: Two times a day (BID) | INTRAVENOUS | Status: DC
  Administered 2022-03-29: 2 g via INTRAVENOUS
  Filled 2022-03-29: qty 12.5

## 2022-03-29 MED ORDER — ONDANSETRON HCL 4 MG/2ML IJ SOLN: 4.0000 mg | Freq: Four times a day (QID) | INTRAMUSCULAR | Status: DC | PRN

## 2022-03-29 MED ORDER — VANCOMYCIN HCL 2000 MG/400ML IV SOLN
2000.0000 mg | Freq: Once | INTRAVENOUS | Status: AC
  Administered 2022-03-29: 2000 mg via INTRAVENOUS
  Filled 2022-03-29: qty 400

## 2022-03-29 MED ORDER — SODIUM CHLORIDE 0.9 % IV SOLN
2.0000 g | INTRAVENOUS | Status: DC
  Administered 2022-03-29: 2 g via INTRAVENOUS
  Filled 2022-03-29: qty 20

## 2022-03-29 MED ORDER — ORAL CARE MOUTH RINSE
15.0000 mL | OROMUCOSAL | Status: DC
  Administered 2022-03-29 – 2022-03-30 (×7): 15 mL via OROMUCOSAL

## 2022-03-29 MED ORDER — ETOMIDATE 2 MG/ML IV SOLN
INTRAVENOUS | Status: AC
  Filled 2022-03-29: qty 20

## 2022-03-29 NOTE — Procedures (Signed)
Intubation Procedure Note  VELDON WAGER  [id number]  09-Mar-1971  Date:2022/04/19  Time:2:23 PM   Provider Performing:Rondalyn Belford Chauncey Cruel Iona Beard    Procedure: Intubation (91478)  Indication(s) Respiratory Failure  Consent Unable to obtain consent due to emergent nature of procedure.   Anesthesia Fentanyl   Time Out Verified patient identification, verified procedure, site/side was marked, verified correct patient position, special equipment/implants available, medications/allergies/relevant history reviewed, required imaging and test results available.   Sterile Technique Usual hand hygeine, masks, and gloves were used   Procedure Description Patient positioned in bed supine.  Sedation given as noted above.  Attempt was made with bougie, unable to pass through cords. Switched to glide scope. Patient was intubated with endotracheal tube using Glidescope.  View was Grade 1 full glottis .  Number of attempts was 2.  Colorimetric CO2 detector was consistent with tracheal placement.   Complications/Tolerance None; patient tolerated the procedure well. Chest X-ray is ordered to verify placement.   EBL Minimal   Specimen(s) None   Redmond School., MSN, APRN, AGACNP-BC [facility]  April 19, 2022 , 2:25 PM  Please see [email or web address] for pager details  If no response, please call [phone] After hours, please call Elink at [phone]

## 2022-03-29 NOTE — Progress Notes (Signed)
Received pt on 3LPM [place] Increased O2 to 5 LPM d/t dyspnea at rest.   1248 Pt with increased work of breathing, O2 sat 83-85% on 15L HFNC. Placed on non rebreathe. MD made aware.   1312 BP lows and then rechecks high, pt no longer responding to verbal stimuli. MD made aware to bed side to assess.    Multiple blood glucose  checks low. MD aware, dextrose given and fluids changed see new order.

## 2022-03-29 NOTE — Consult Note (Signed)
Urology Consult   Physician requesting consult: Karie Fetch, DO  Reason for consult: Difficult foley catheter placement, penile and scrotal edema  History of Present Illness: Jared Bass is a 51 y.o. with a complex past medical history of alcoholic cirrhosis, portal hypertension who has recurrent ascites requiring frequent paracentesis presented with abdominal distention, abdominal and chest pain.  He underwent paracentesis yesterday and developed septic shock.  He is currently intubated and sedated.  I am unable to gather any history of present illness from the patient.  Primary team attempted to place Foley catheter however he had significant anasarca as well as lower extremity edema and penile and scrotal edema.  Nursing staff were unable to place Foley catheter.  Urology was called to place catheter for accurate in and out.  Past Medical History:  Diagnosis Date   Acute renal failure (HCC)    Acute respiratory failure (HCC)    2017   Acute respiratory failure with hypoxia (HCC)    Acute saddle pulmonary embolism with acute cor pulmonale (HCC)    Cataract    Chronic neck pain    Cirrhosis (HCC)    Closed fracture of nasal bone    DDD (degenerative disc disease), cervical 05/04/2015   Faintness    Hemoptysis    History of atrial fibrillation    History of head injury 05/04/2015   History of tachycardia 05/04/2015   Hypertension    Hypotension    Liver cirrhosis (HCC)    Pulmonary emboli (HCC)    on elequis   Pulmonary embolism (HCC)    Right eye injury    Saddle pulmonary embolus (HCC) 12/13/2015   Substance abuse (HCC)    Syncope and collapse    Tachycardia 12/20/2015    Past Surgical History:  Procedure Laterality Date   ESOPHAGOGASTRODUODENOSCOPY (EGD) WITH PROPOFOL N/A 01/29/2020   Procedure: ESOPHAGOGASTRODUODENOSCOPY (EGD) WITH PROPOFOL;  Surgeon: Shellia Cleverly, DO;  Location: [facility];  Service: Gastroenterology;  Laterality: N/A;   IR PARACENTESIS   01/29/2020   IR PARACENTESIS  12/27/2020   neck fusion     c5-7   TONSILLECTOMY     UPPER GASTROINTESTINAL ENDOSCOPY      Current Hospital Medications:  Home Meds:  No current facility-administered medications on file prior to encounter.   Current Outpatient Medications on File Prior to Encounter  Medication Sig Dispense Refill   albuterol (VENTOLIN HFA) 108 (90 Base) MCG/ACT inhaler INHALE 2 PUFFS INTO THE LUNGS EVERY 6 HOURS AS NEEDED (COUGH). (Patient taking differently: Inhale 2 puffs into the lungs every 6 (six) hours as needed for wheezing or shortness of breath (or coughing).) 18 g 1   amphetamine-dextroamphetamine (ADDERALL) 20 MG tablet Take 20 mg by mouth 2 (two) times daily.     carvedilol (COREG) 6.25 MG tablet Take 6.25 mg by mouth 2 (two) times daily.     ciprofloxacin (CIPRO) 500 MG tablet Take 1 tablet (500 mg total) by mouth daily.     furosemide (LASIX) 40 MG tablet Take 1 tablet (40 mg total) by mouth 2 (two) times daily. (Patient taking differently: Take 20 mg by mouth daily.) 60 tablet 2   polyethylene glycol (MIRALAX / GLYCOLAX) packet Take 17 g by mouth daily. (Patient taking differently: Take 17 g by mouth daily as needed for mild constipation (mix and drink).) 28 packet 2   Multiple Vitamins-Minerals (MULTI + OMEGA-3 ADULT GUMMIES PO) Take 1 tablet by mouth daily. (Patient not taking: Reported on 03/22/2022)  spironolactone (ALDACTONE) 50 MG tablet Take 1 tablet (50 mg total) by mouth daily. (Patient not taking: Reported on 03/04/2022) 30 tablet 3     Scheduled Meds:  docusate  100 mg Per Tube BID   heparin  5,000 Units Subcutaneous Q8H   lidocaine  1 Application Urethral Once   midazolam  2 mg Intravenous Once   pantoprazole  40 mg Per Tube Daily   polyethylene glycol  17 g Per Tube Daily   vancomycin variable dose per unstable renal function (pharmacist dosing)   Does not apply See admin instructions   Continuous Infusions:  ceFEPime (MAXIPIME) IV 2 g  (03/11/2022 1433)   dextrose 50 mL/hr at 03/22/2022 1523   norepinephrine (LEVOPHED) Adult infusion 30 mcg/min (03/21/2022 1400)   vancomycin 2,000 mg (03/07/2022 1509)   vasopressin 0.04 Units/min (03/11/2022 1400)   PRN Meds:.docusate sodium, fentaNYL (SUBLIMAZE) injection, fentaNYL (SUBLIMAZE) injection, ondansetron (ZOFRAN) IV, oxyCODONE, polyethylene glycol  Allergies:  Allergies  Allergen Reactions   Tramadol Anaphylaxis and Rash   Other Swelling and Other (See Comments)    Rembrandt Whitening strips - "fat lips"    Family History  Problem Relation Age of Onset   Hypertension Mother    COPD Mother    Mesothelioma Father    COPD Father    Other Sister        Fatty liver disease   Peripheral vascular disease Brother        Clots in his arteries surgically removed   Colon cancer Neg Hx    Stomach cancer Neg Hx    Esophageal cancer Neg Hx    Rectal cancer Neg Hx     Social History:  reports that he has never smoked. He has never used smokeless tobacco. He reports that he does not drink alcohol and does not use drugs.  ROS: A complete review of systems was performed.  All systems are negative except for pertinent findings as noted.  Physical Exam:  Vital signs in last 24 hours: Temp:  [94 F (34.4 C)-98.3 F (36.8 C)] 94 F (34.4 C) (09/28 1611) Pulse Rate:  [73-116] 108 (09/28 1520) Resp:  [18-36] 18 (09/28 1520) BP: (65-150)/(45-107) 125/63 (09/28 1520) SpO2:  [74 %-100 %] 88 % (09/28 1631) Arterial Line BP: (118-120)/(56-59) 118/59 (09/28 1445) FiO2 (%):  [90 %-100 %] 90 % (09/28 1631) Weight:  [97.5 kg] 97.5 kg (09/28 0502) Constitutional: Intubated, sedated Cardiovascular: Regular rate and rhythm Respiratory: Intubated, sedated on ventilator GI: Abdomen is distended GU: Significant penile and scrotal edema, initially unable to visualize meatus.   Laboratory Data:  Recent Labs    03/28/22 0821 03/18/2022 0515 03/28/2022 1517  WBC 6.7 1.7*  --   HGB 9.5* 9.2*  7.8*  HCT 26.3* 26.0* 23.0*  PLT 86* 79*  --     Recent Labs    03/28/22 0821 03/15/2022 0515 03/04/2022 1517  NA 123* 120* 125*  K 4.1 4.4 4.1  CL 89* 90*  --   GLUCOSE 120* 39*  --   BUN 26* 33*  --   CALCIUM 8.1* 7.9*  --   CREATININE 1.22 1.98*  --      Results for orders placed or performed during the hospital encounter of 03/21/2022 (from the past 24 hour(s))  CBC with Differential     Status: Abnormal   Collection Time: 03/26/2022  5:15 AM  Result Value Ref Range   WBC 1.7 (L) 4.0 - 10.5 K/uL   RBC 2.42 (L) 4.22 - 5.81  MIL/uL   Hemoglobin 9.2 (L) 13.0 - 17.0 g/dL   HCT 42.6 (L) 83.4 - 19.6 %   MCV 107.4 (H) 80.0 - 100.0 fL   MCH 38.0 (H) 26.0 - 34.0 pg   MCHC 35.4 30.0 - 36.0 g/dL   RDW 22.2 97.9 - 89.2 %   Platelets 79 (L) 150 - 400 K/uL   nRBC 1.8 (H) 0.0 - 0.2 %   Neutrophils Relative % 71 %   Neutro Abs 1.2 (L) 1.7 - 7.7 K/uL   Lymphocytes Relative 16 %   Lymphs Abs 0.3 (L) 0.7 - 4.0 K/uL   Monocytes Relative 7 %   Monocytes Absolute 0.1 0.1 - 1.0 K/uL   Eosinophils Relative 4 %   Eosinophils Absolute 0.1 0.0 - 0.5 K/uL   Basophils Relative 1 %   Basophils Absolute 0.0 0.0 - 0.1 K/uL   WBC Morphology      MODERATE LEFT SHIFT (>5% METAS AND MYELOS,OCC PRO NOTED)   RBC Morphology MORPHOLOGY UNREMARKABLE    Smear Review See Note    Immature Granulocytes 1 %   Abs Immature Granulocytes 0.01 0.00 - 0.07 K/uL  Comprehensive metabolic panel     Status: Abnormal   Collection Time: 04-12-2022  5:15 AM  Result Value Ref Range   Sodium 120 (L) 135 - 145 mmol/L   Potassium 4.4 3.5 - 5.1 mmol/L   Chloride 90 (L) 98 - 111 mmol/L   CO2 18 (L) 22 - 32 mmol/L   Glucose, Bld 39 (LL) 70 - 99 mg/dL   BUN 33 (H) 6 - 20 mg/dL   Creatinine, Ser 1.19 (H) 0.61 - 1.24 mg/dL   Calcium 7.9 (L) 8.9 - 10.3 mg/dL   Total Protein 5.1 (L) 6.5 - 8.1 g/dL   Albumin 1.6 (L) 3.5 - 5.0 g/dL   AST 61 (H) 15 - 41 U/L   ALT 30 0 - 44 U/L   Alkaline Phosphatase 109 38 - 126 U/L   Total  Bilirubin 10.6 (H) 0.3 - 1.2 mg/dL   GFR, Estimated 40 (L) >60 mL/min   Anion gap 12 5 - 15  Brain natriuretic peptide     Status: None   Collection Time: 12-Apr-2022  5:15 AM  Result Value Ref Range   B Natriuretic Peptide 54.3 0.0 - 100.0 pg/mL  Troponin I (High Sensitivity)     Status: None   Collection Time: 04-12-2022  5:15 AM  Result Value Ref Range   Troponin I (High Sensitivity) 15 <18 ng/L  Protime-INR     Status: Abnormal   Collection Time: 04/12/22  5:15 AM  Result Value Ref Range   Prothrombin Time 24.4 (H) 11.4 - 15.2 seconds   INR 2.2 (H) 0.8 - 1.2  Ammonia     Status: None   Collection Time: 12-Apr-2022  5:15 AM  Result Value Ref Range   Ammonia 29 9 - 35 umol/L  Magnesium     Status: None   Collection Time: 2022/04/12  5:15 AM  Result Value Ref Range   Magnesium 1.7 1.7 - 2.4 mg/dL  Type and screen [facility]     Status: None   Collection Time: 04-12-22  5:30 AM  Result Value Ref Range   ABO/RH(D) A POS    Antibody Screen NEG    Sample Expiration      04/01/2022,2359 Performed at [facility], [address] [place], [place] [address]   Resp Panel by RT-PCR (Flu A&B, Covid) Anterior  Nasal Swab     Status: None   Collection Time: 03/20/2022  6:02 AM   Specimen: Anterior Nasal Swab  Result Value Ref Range   SARS Coronavirus 2 by RT PCR NEGATIVE NEGATIVE   Influenza A by PCR NEGATIVE NEGATIVE   Influenza B by PCR NEGATIVE NEGATIVE  Lactic acid, plasma     Status: Abnormal   Collection Time: 03/06/2022  6:12 AM  Result Value Ref Range   Lactic Acid, Venous 4.0 (HH) 0.5 - 1.9 mmol/L  Lactate dehydrogenase (pleural or peritoneal fluid)     Status: Abnormal   Collection Time: 03/28/2022  6:29 AM  Result Value Ref Range   LD, Fluid 63 (H) 3 - 23 U/L   Fluid Type-FLDH PERITONEAL   Glucose, pleural or peritoneal fluid     Status: None   Collection Time: 03/16/2022  6:29 AM  Result Value Ref Range   Glucose, Fluid 41 mg/dL   Fluid Type-FGLU PERITONEAL    Protein, pleural or peritoneal fluid     Status: None   Collection Time: 03/27/2022  6:29 AM  Result Value Ref Range   Total protein, fluid <3.0 g/dL   Fluid Type-FTP PERITONEAL   Albumin, pleural or peritoneal fluid      Status: None   Collection Time: 03/27/2022  6:29 AM  Result Value Ref Range   Albumin, Fluid <1.5 g/dL   Fluid Type-FALB PERITONEAL   Body fluid culture w Gram Stain     Status: None (Preliminary result)   Collection Time: 03/08/2022  6:29 AM   Specimen: Peritoneal Washings; Peritoneal Fluid  Result Value Ref Range   Specimen Description PERITONEAL FLUID    Special Requests PERITONEAL CAVITY    Gram Stain      FEW WBC PRESENT,BOTH PMN AND MONONUCLEAR NO ORGANISMS SEEN Performed at [facility], [address] [place], [place] [address]    Culture PENDING    Report Status PENDING   Body fluid cell count with differential     Status: Abnormal   Collection Time: 03/05/2022  6:29 AM  Result Value Ref Range   Fluid Type-FCT Peritoneal    Color, Fluid YELLOW YELLOW   Appearance, Fluid CLOUDY (A) CLEAR   Total Nucleated Cell Count, Fluid 520 0 - 1,000 cu mm   Neutrophil Count, Fluid 89 (H) 0 - 25 %   Lymphs, Fluid 5 %   Monocyte-Macrophage-Serous Fluid 6 (L) 50 - 90 %   Eos, Fluid 0 %   Other Cells, Fluid RARE MESOTHELIAL CELL SEEN ON SCAN. %  Procalcitonin - Baseline     Status: None   Collection Time: 03/23/2022  7:37 AM  Result Value Ref Range   Procalcitonin 8.16 ng/mL  Lactic acid, plasma     Status: Abnormal   Collection Time: 03/07/2022  7:39 AM  Result Value Ref Range   Lactic Acid, Venous 4.0 (HH) 0.5 - 1.9 mmol/L  APTT     Status: Abnormal   Collection Time: 03/12/2022  7:39 AM  Result Value Ref Range   aPTT 51 (H) 24 - 36 seconds  Troponin I (High Sensitivity)     Status: None   Collection Time: 03/05/2022  7:39 AM  Result Value Ref Range   Troponin I (High Sensitivity) 12 <18 ng/L  Cortisol     Status: None   Collection Time: 03/08/2022  7:39 AM   Result Value Ref Range   Cortisol, Plasma 63.6 ug/dL  CBG monitoring, ED     Status:  Abnormal   Collection Time: 2022-04-12  7:57 AM  Result Value Ref Range   Glucose-Capillary 24 (LL) 70 - 99 mg/dL  CBG monitoring, ED     Status: Abnormal   Collection Time: 04-12-2022  8:06 AM  Result Value Ref Range   Glucose-Capillary 218 (H) 70 - 99 mg/dL  Glucose, capillary     Status: Abnormal   Collection Time: 2022/04/12  9:55 AM  Result Value Ref Range   Glucose-Capillary 15 (LL) 70 - 99 mg/dL   Comment 1 Notify RN   Glucose, capillary     Status: Abnormal   Collection Time: 12-Apr-2022  9:56 AM  Result Value Ref Range   Glucose-Capillary 13 (LL) 70 - 99 mg/dL   Comment 1 Notify RN   MRSA Next Gen by PCR, Nasal     Status: Abnormal   Collection Time: 04-12-22 10:08 AM   Specimen: Nasal Mucosa; Nasal Swab  Result Value Ref Range   MRSA by PCR Next Gen DETECTED (A) NOT DETECTED  Lactic acid, plasma     Status: Abnormal   Collection Time: Apr 12, 2022 10:14 AM  Result Value Ref Range   Lactic Acid, Venous 4.2 (HH) 0.5 - 1.9 mmol/L  Glucose, capillary     Status: None   Collection Time: 2022-04-12 10:19 AM  Result Value Ref Range   Glucose-Capillary 82 70 - 99 mg/dL  Glucose, capillary     Status: Abnormal   Collection Time: 04/12/2022 11:25 AM  Result Value Ref Range   Glucose-Capillary 47 (L) 70 - 99 mg/dL  Lactic acid, plasma     Status: Abnormal   Collection Time: 04-12-22 12:30 PM  Result Value Ref Range   Lactic Acid, Venous 4.0 (HH) 0.5 - 1.9 mmol/L  Glucose, capillary     Status: Abnormal   Collection Time: 2022-04-12  1:16 PM  Result Value Ref Range   Glucose-Capillary 55 (L) 70 - 99 mg/dL  Glucose, capillary     Status: None   Collection Time: 04/12/2022  3:00 PM  Result Value Ref Range   Glucose-Capillary 72 70 - 99 mg/dL  I-STAT 7, (LYTES, BLD GAS, ICA, H+H)     Status: Abnormal   Collection Time: 04-12-2022  3:17 PM  Result Value Ref Range   pH, Arterial 7.188 (LL) 7.35 - 7.45   pCO2  arterial 55.6 (H) 32 - 48 mmHg   pO2, Arterial 64 (L) 83 - 108 mmHg   Bicarbonate 21.8 20.0 - 28.0 mmol/L   TCO2 24 22 - 32 mmol/L   O2 Saturation 89 %   Acid-base deficit 7.0 (H) 0.0 - 2.0 mmol/L   Sodium 125 (L) 135 - 145 mmol/L   Potassium 4.1 3.5 - 5.1 mmol/L   Calcium, Ion 1.13 (L) 1.15 - 1.40 mmol/L   HCT 23.0 (L) 39.0 - 52.0 %   Hemoglobin 7.8 (L) 13.0 - 17.0 g/dL   Patient temperature 16.1 F    Collection site Web designer by Operator    Sample type ARTERIAL    Comment NOTIFIED PHYSICIAN   Glucose, capillary     Status: None   Collection Time: April 12, 2022  4:02 PM  Result Value Ref Range   Glucose-Capillary 80 70 - 99 mg/dL   Recent Results (from the past 240 hour(s))  Resp Panel by RT-PCR (Flu A&B, Covid) Anterior Nasal Swab     Status: None   Collection Time: 04-12-2022  6:02 AM   Specimen: Anterior Nasal Swab  Result Value Ref  Range Status   SARS Coronavirus 2 by RT PCR NEGATIVE NEGATIVE Final    Comment: (NOTE) SARS-CoV-2 target nucleic acids are NOT DETECTED.  The SARS-CoV-2 RNA is generally detectable in upper respiratory specimens during the acute phase of infection. The lowest concentration of SARS-CoV-2 viral copies this assay can detect is 138 copies/mL. A negative result does not preclude SARS-Cov-2 infection and should not be used as the sole basis for treatment or other patient management decisions. A negative result may occur with  improper specimen collection/handling, submission of specimen other than nasopharyngeal swab, presence of viral mutation(s) within the areas targeted by this assay, and inadequate number of viral copies(<138 copies/mL). A negative result must be combined with clinical observations, patient history, and epidemiological information. The expected result is Negative.  Fact Sheet for Patients:  BloggerCourse.com  Fact Sheet for Healthcare Providers:  SeriousBroker.it  This  test is no t yet approved or cleared by the [place] FDA and  has been authorized for detection and/or diagnosis of SARS-CoV-2 by FDA under an Emergency Use Authorization (EUA). This EUA will remain  in effect (meaning this test can be used) for the duration of the COVID-19 declaration under Section 564(b)(1) of the Act, 21 U.S.C.section 360bbb-3(b)(1), unless the authorization is terminated  or revoked sooner.       Influenza A by PCR NEGATIVE NEGATIVE Final   Influenza B by PCR NEGATIVE NEGATIVE Final    Comment: (NOTE) The Xpert Xpress SARS-CoV-2/FLU/RSV plus assay is intended as an aid in the diagnosis of influenza from Nasopharyngeal swab specimens and should not be used as a sole basis for treatment. Nasal washings and aspirates are unacceptable for Xpert Xpress SARS-CoV-2/FLU/RSV testing.  Fact Sheet for Patients: BloggerCourse.com  Fact Sheet for Healthcare Providers: SeriousBroker.it  This test is not yet approved or cleared by the [place] FDA and has been authorized for detection and/or diagnosis of SARS-CoV-2 by FDA under an Emergency Use Authorization (EUA). This EUA will remain in effect (meaning this test can be used) for the duration of the COVID-19 declaration under Section 564(b)(1) of the Act, 21 U.S.C. section 360bbb-3(b)(1), unless the authorization is terminated or revoked.  Performed at [facility], [address] [place], [place] [address]   Body fluid culture w Gram Stain     Status: None (Preliminary result)   Collection Time: 03/03/2022  6:29 AM   Specimen: Peritoneal Washings; Peritoneal Fluid  Result Value Ref Range Status   Specimen Description PERITONEAL FLUID  Final   Special Requests PERITONEAL CAVITY  Final   Gram Stain   Final    FEW WBC PRESENT,BOTH PMN AND MONONUCLEAR NO ORGANISMS SEEN Performed at [facility], [address] [place], [place] [address]     Culture PENDING  Incomplete   Report Status PENDING  Incomplete  MRSA Next Gen by PCR, Nasal     Status: Abnormal   Collection Time: 03/10/2022 10:08 AM   Specimen: Nasal Mucosa; Nasal Swab  Result Value Ref Range Status   MRSA by PCR Next Gen DETECTED (A) NOT DETECTED Final    Comment: RESULT CALLED TO, READ BACK BY AND VERIFIED WITH: [id number] 03/14/2022 RN REYNA (NOTE) The GeneXpert MRSA Assay (FDA approved for NASAL specimens only), is one component of a comprehensive MRSA colonization surveillance program. It is not intended to diagnose MRSA infection nor to guide or monitor treatment for MRSA infections. Test performance is not FDA approved in patients less than 36 years old. Performed  at [facility], [address] [place], [place] [address]     Renal Function: Recent Labs    03/28/22 6045 03/28/2022 0515  CREATININE 1.22 1.98*   Estimated Creatinine Clearance: 48.8 mL/min (A) (by C-G formula based on SCr of 1.98 mg/dL (H)).  Radiologic Imaging: DG CHEST PORT 1 VIEW  Result Date: 03/08/2022 CLINICAL DATA:  Endotracheal tube placement EXAM: PORTABLE CHEST 1 VIEW COMPARISON:  1233 earlier today FINDINGS: 2:23 p.m. Right internal jugular line tip at superior caval/atrial junction. Endotracheal tube terminates 2.9 cm above carina. Nasogastric tube terminates at the body of the stomach with the side port below the GE junction. Numerous leads and wires project over the chest. Cardiomegaly accentuated by AP portable technique. Small bilateral pleural effusions. No pneumothorax. Interstitial edema is mild, decreased. Left-greater-than-right base airspace disease is also slightly improved. IMPRESSION: Placement of endotracheal and nasogastric tubes, appropriately positioned. Improvement in congestive heart failure and bibasilar atelectasis. Persistent small bilateral pleural effusions. Electronically Signed   By: Jeronimo Greaves M.D.   On: 03/28/2022 14:39   DG Chest Port 1 View  Result  Date: 03/28/2022 CLINICAL DATA:  Central line placement. EXAM: PORTABLE CHEST 1 VIEW COMPARISON:  Same day. FINDINGS: Interval placement of right internal jugular catheter with distal tip in expected position of cavoatrial junction. No definite pneumothorax is noted. IMPRESSION: Interval placement of right internal jugular catheter with distal tip in expected position of cavoatrial junction. Electronically Signed   By: Lupita Raider M.D.   On: 03/20/2022 12:41   CT Angio Chest PE W and/or Wo Contrast  Result Date: 03/14/2022 CLINICAL DATA:  Shortness of breath and abdominal pain. EXAM: CT ANGIOGRAPHY CHEST CT ABDOMEN AND PELVIS WITH CONTRAST TECHNIQUE: Multidetector CT imaging of the chest was performed using the standard protocol during bolus administration of intravenous contrast. Multiplanar CT image reconstructions and MIPs were obtained to evaluate the vascular anatomy. Multidetector CT imaging of the abdomen and pelvis was performed using the standard protocol during bolus administration of intravenous contrast. RADIATION DOSE REDUCTION: This exam was performed according to the departmental dose-optimization program which includes automated exposure control, adjustment of the mA and/or kV according to patient size and/or use of iterative reconstruction technique. CONTRAST:  75mL OMNIPAQUE IOHEXOL 350 MG/ML SOLN COMPARISON:  Chest CT from 2017 FINDINGS: CTA CHEST FINDINGS Cardiovascular: The heart is normal in size. No pericardial effusion. The aorta is normal in caliber. No dissection. Poorly opacified pulmonary arteries but no large central filling defects to suggest central pulmonary emboli. Mediastinum/Nodes: Borderline mediastinal and hilar lymph nodes. Moderate mid and significant distal esophageal wall thickening. Findings likely due to massive esophageal varices. Lungs/Pleura: Moderate-sized left pleural effusion with overlying atelectasis. Right basilar atelectasis is also noted. Moderate central  vascular congestion without overt pulmonary edema. Musculoskeletal: Significant bilateral gynecomastia is new since the prior study and likely due to the patient's liver disease. The bony thorax is intact. Review of the MIP images confirms the above findings. CT ABDOMEN and PELVIS FINDINGS Hepatobiliary: Advanced changes of cirrhosis. Associated portal venous hypertension and massive portal venous collaterals. Small scattered low-attenuation lesions are indeterminate. I do not see any definite arterial phase enhancement on the PE study. Biliary dilatation noted in the left hepatic lobe. The gallbladder is mildly distended. No gallstones. No common bile duct dilatation. Pancreas: No mass, inflammation or ductal dilatation. Spleen: Within normal limits in size. Perisplenic collaterals and splenorenal shunt. Adrenals/Urinary Tract: Adrenal glands and kidneys are unremarkable. The bladder is grossly normal. Stomach/Bowel:  The stomach, duodenum, small bowel and colon grossly normal. No obstructive findings. Vascular/Lymphatic: The aorta and branch vessels are patent. Upper abdominal lymph nodes typical with cirrhosis. No retroperitoneal mass or adenopathy. Reproductive: The prostate gland and seminal vesicles grossly. Other: Massive abdominal/pelvic ascites. Severe diffuse body wall edema consistent with anasarca. There is also massive penile and scrotal edema. Musculoskeletal: No significant bony findings. Review of the MIP images confirms the above findings. IMPRESSION: 1. Advanced changes of cirrhosis with portal venous hypertension, portal venous collaterals, esophageal varices and large volume abdominal/pelvic ascites. 2. Small scattered low-attenuation liver lesions are indeterminate. Recommend MRI abdomen with and without contrast for further evaluation. 3. No large central pulmonary emboli but study is limited due to poor opacification the pulmonary arteries. 4. Moderate-sized left pleural effusion with overlying  atelectasis. 5. Moderate central vascular congestion without overt pulmonary edema. 6. Significant diffuse body wall edema consistent with anasarca. There is also massive penile and scrotal edema. Electronically Signed   By: Rudie Meyer M.D.   On: 03/16/2022 07:33   CT ABDOMEN PELVIS W CONTRAST  Result Date: 03/24/2022 CLINICAL DATA:  Shortness of breath and abdominal pain. EXAM: CT ANGIOGRAPHY CHEST CT ABDOMEN AND PELVIS WITH CONTRAST TECHNIQUE: Multidetector CT imaging of the chest was performed using the standard protocol during bolus administration of intravenous contrast. Multiplanar CT image reconstructions and MIPs were obtained to evaluate the vascular anatomy. Multidetector CT imaging of the abdomen and pelvis was performed using the standard protocol during bolus administration of intravenous contrast. RADIATION DOSE REDUCTION: This exam was performed according to the departmental dose-optimization program which includes automated exposure control, adjustment of the mA and/or kV according to patient size and/or use of iterative reconstruction technique. CONTRAST:  66mL OMNIPAQUE IOHEXOL 350 MG/ML SOLN COMPARISON:  Chest CT from 2017 FINDINGS: CTA CHEST FINDINGS Cardiovascular: The heart is normal in size. No pericardial effusion. The aorta is normal in caliber. No dissection. Poorly opacified pulmonary arteries but no large central filling defects to suggest central pulmonary emboli. Mediastinum/Nodes: Borderline mediastinal and hilar lymph nodes. Moderate mid and significant distal esophageal wall thickening. Findings likely due to massive esophageal varices. Lungs/Pleura: Moderate-sized left pleural effusion with overlying atelectasis. Right basilar atelectasis is also noted. Moderate central vascular congestion without overt pulmonary edema. Musculoskeletal: Significant bilateral gynecomastia is new since the prior study and likely due to the patient's liver disease. The bony thorax is intact.  Review of the MIP images confirms the above findings. CT ABDOMEN and PELVIS FINDINGS Hepatobiliary: Advanced changes of cirrhosis. Associated portal venous hypertension and massive portal venous collaterals. Small scattered low-attenuation lesions are indeterminate. I do not see any definite arterial phase enhancement on the PE study. Biliary dilatation noted in the left hepatic lobe. The gallbladder is mildly distended. No gallstones. No common bile duct dilatation. Pancreas: No mass, inflammation or ductal dilatation. Spleen: Within normal limits in size. Perisplenic collaterals and splenorenal shunt. Adrenals/Urinary Tract: Adrenal glands and kidneys are unremarkable. The bladder is grossly normal. Stomach/Bowel: The stomach, duodenum, small bowel and colon grossly normal. No obstructive findings. Vascular/Lymphatic: The aorta and branch vessels are patent. Upper abdominal lymph nodes typical with cirrhosis. No retroperitoneal mass or adenopathy. Reproductive: The prostate gland and seminal vesicles grossly. Other: Massive abdominal/pelvic ascites. Severe diffuse body wall edema consistent with anasarca. There is also massive penile and scrotal edema. Musculoskeletal: No significant bony findings. Review of the MIP images confirms the above findings. IMPRESSION: 1. Advanced changes of cirrhosis with portal venous hypertension, portal venous collaterals, esophageal  varices and large volume abdominal/pelvic ascites. 2. Small scattered low-attenuation liver lesions are indeterminate. Recommend MRI abdomen with and without contrast for further evaluation. 3. No large central pulmonary emboli but study is limited due to poor opacification the pulmonary arteries. 4. Moderate-sized left pleural effusion with overlying atelectasis. 5. Moderate central vascular congestion without overt pulmonary edema. 6. Significant diffuse body wall edema consistent with anasarca. There is also massive penile and scrotal edema.  Electronically Signed   By: Marijo Sanes M.D.   On: 03/15/2022 07:33   DG Chest Portable 1 View  Result Date: 03/02/2022 CLINICAL DATA:  51 year old male with shortness of breath and weakness. EXAM: PORTABLE CHEST 1 VIEW COMPARISON:  03/02/2022 and earlier. FINDINGS: Portable AP semi upright view at 0515 hours. Continued low lung volumes. Mediastinal contours are stable, heart size at the upper limits of normal. Indistinct left lung base opacity, with small pleural effusions suspected previously. No pneumothorax or consolidation. Pulmonary vascularity is stable with no acute edema. No definite right pleural effusion. Paucity of bowel gas. Prior cervical ACDF. IMPRESSION: Continued low lung volumes with indistinct left lung base opacity which may reflect ongoing or increased left pleural effusion since earlier this month. No pulmonary edema. Electronically Signed   By: Genevie Ann M.D.   On: 03/03/2022 05:40    I independently reviewed the above imaging studies.  Procedure note: Foley catheter placement Under sterile conditions, his penis was then prepped and draped after squeezing his penis to allow the edema to transiently resolved.  I was unable to place a 69 [place] coud catheter into his bladder with return of amber urine.  Impression/Recommendation Difficult Foley catheter placement due to penoscrotal edema: 26 French Foley catheter was placed on 03/19/2022 with return of amber urine. Penile and scrotal edema  -I was able to squeeze his penis and transiently reduce his edema in order to visualize his meatus.  16 French coud catheter was placed in the bladder without difficulty. -Void trial per primary team -Recommend scrotal support for penoscrotal edema -Please call with questions.  Matt R. Carmina Walle MD 03/22/2022, 4:34 PM  [facility]  Pager: [phone]   CC: Noemi Chapel, DO

## 2022-03-29 NOTE — Sepsis Progress Note (Signed)
Following for sepsis monitoring ?

## 2022-03-29 NOTE — Progress Notes (Signed)
RT obtained ABG and reported results to MD and NP @1730 . ABG results as follows: 7.26/32.3/47/15. RT increased RR form 30 to 32, decreased tidal volume from 7cc to 6cc and increased peep from 10 to 14 per NP. RT will continue to monitor.

## 2022-03-29 NOTE — Progress Notes (Signed)
Hypoglycemic Event  CBG: 13  Treatment: D50 50 mL (25 gm)  Symptoms: None  Follow-up CBG: Time:1019 CBG Result:82   Possible Reasons for Event: Other: npo  Comments/MD notified:Peter Kary Kos NP    Chaney Born

## 2022-03-29 NOTE — Procedures (Addendum)
Arterial Catheter Insertion Procedure Note  Jared Bass  [id number]  1971/05/26  Date:03/11/2022  Time:2:18 PM    Provider Performing: Audie Box under the direct supervision of Hillery Aldo NP   Procedure: Insertion of Arterial Line [id number]) with US guidance ([id number])   Indication(s) Blood pressure monitoring and/or need for frequent ABGs  Consent Unable to obtain consent due to emergent nature of procedure.  Anesthesia None   Time Out Verified patient identification, verified procedure, site/side was marked, verified correct patient position, special equipment/implants available, medications/allergies/relevant history reviewed, required imaging and test results available.   Sterile Technique Maximal sterile technique including full sterile barrier drape, hand hygiene, sterile gown, sterile gloves, mask, hair covering, sterile ultrasound probe cover (if used).   Procedure Description Area of catheter insertion was cleaned with chlorhexidine and draped in sterile fashion. With real-time ultrasound guidance an arterial catheter was placed into the left radial artery.  Appropriate arterial tracings confirmed on monitor.     Complications/Tolerance None; patient tolerated the procedure well.   EBL Minimal   Specimen(s) None   Audie Box Student NP

## 2022-03-29 NOTE — ED Triage Notes (Addendum)
Presents for SOB x 1 day with [facility]. H/o cirrhosis with testicle and leg swelling and jaundice appearance noted on exam. EMS notes tachy at 110bpm. No missed doses of lasix per pt. No meds given en route with EMS. Was scheduled for a paracentesis this AM but did not receive.

## 2022-03-29 NOTE — ED Provider Triage Note (Signed)
Emergency Medicine Provider Triage Evaluation Note  Jared Bass , a 51 y.o. male  was evaluated in triage.  Pt complains of shortness of breath and weakness, fluid overload.  Patient with history of alcoholic cirrhosis, was scheduled for paracentesis today but did not undergo it for unknown reason.  Seen in the ED yesterday where he was offered therapeutic paracentesis but declined due to preference to follow-up with transplant center which did not happen today.  Very ill-appearing and jaundiced in triage.  Review of Systems  Positive: As above bilateral lower extremity edema and abdominal distention Negative: Chest pain  Physical Exam  BP (!) 75/50 (BP Location: Right Arm)   Pulse (!) 109   Temp 98.3 F (36.8 C) (Oral)   Resp 18   Wt 97.5 kg   SpO2 96%   BMI 32.69 kg/m  Gen:   Somnolent, toxic appearing Resp:  Increased effort rales in the lung bases, decreased air movement due to extent of abdominal distention MSK:   Moves extremities without difficulty  Other:  Abdominal distention, notable.  Umbilical hernia.  Fluid wave.  Medical Decision Making  Medically screening exam initiated at 5:11 AM.  Appropriate orders placed.  REYAN HELLE was informed that the remainder of the evaluation will be completed by another provider, this initial triage assessment does not replace that evaluation, and the importance of remaining in the ED until their evaluation is complete.  Patient with systolic pressures in the 70s in triage, charge RN aware, patient critically ill went to be transported to the neck room in the main ER.  This chart was dictated using voice recognition software, Dragon. Despite the best efforts of this provider to proofread and correct errors, errors may still occur which can change documentation meaning.    Emeline Darling, PA-C 12-Apr-2022 [phone]

## 2022-03-29 NOTE — Progress Notes (Signed)
RT obtained ABG and reported results to MD and NP @1520 . ABG as follows: 7.18/55.6/64/21.8. RT increased RR from 18 to 24 per NP. No other new orders for RT at this time. RT will continue to monitor.

## 2022-03-29 NOTE — ED Provider Notes (Signed)
[facility] EMERGENCY DEPARTMENT Provider Note   CSN: [id number] Arrival date & time: 03/20/2022  0447     History  Chief Complaint  Patient presents with   Shortness of Breath  Level 5 caveat due to acuity of condition  Jared Bass is a 51 y.o. male.  The history is provided by the patient.     Patient with known history of alcoholic cirrhosis presents with multiple complaints.  He reports increasing shortness of breath and abdominal swelling.  He reports he was supposed to get a paracentesis yesterday but was unable to get it done. No chest pain.  He reports his main issue is abdominal fullness due to fluid  Home Medications Prior to Admission medications   Medication Sig Start Date End Date Taking? Authorizing Provider  albuterol (VENTOLIN HFA) 108 (90 Base) MCG/ACT inhaler INHALE 2 PUFFS INTO THE LUNGS EVERY 6 HOURS AS NEEDED (COUGH). Patient taking differently: Inhale 2 puffs into the lungs every 6 (six) hours as needed for wheezing or shortness of breath (or coughing). 05/04/19  Yes Inez Catalina, MD  amphetamine-dextroamphetamine (ADDERALL) 20 MG tablet Take 20 mg by mouth 2 (two) times daily. 12/15/21  Yes [provider]  carvedilol (COREG) 6.25 MG tablet Take 6.25 mg by mouth 2 (two) times daily. 03/22/22  Yes [provider]  ciprofloxacin (CIPRO) 500 MG tablet Take 1 tablet (500 mg total) by mouth daily. 03/07/22  Yes Lonia Blood, MD  furosemide (LASIX) 40 MG tablet Take 1 tablet (40 mg total) by mouth 2 (two) times daily. Patient taking differently: Take 20 mg by mouth daily. 03/06/22  Yes Lonia Blood, MD  polyethylene glycol (MIRALAX / GLYCOLAX) packet Take 17 g by mouth daily. Patient taking differently: Take 17 g by mouth daily as needed for mild constipation (mix and drink). 07/30/17  Yes Arnetha Courser, MD  Multiple Vitamins-Minerals (MULTI + OMEGA-3 ADULT GUMMIES PO) Take 1 tablet by mouth daily. Patient not taking:  Reported on 03/02/2022    [provider]  spironolactone (ALDACTONE) 50 MG tablet Take 1 tablet (50 mg total) by mouth daily. Patient not taking: Reported on 03/26/2022 01/01/21   Hartley Barefoot A, MD      Allergies    Tramadol and Other    Review of Systems   Review of Systems  Unable to perform ROS: Acuity of condition    Physical Exam Updated Vital Signs BP (!) 72/54   Pulse (!) 109   Temp 98.3 F (36.8 C) (Oral)   Resp 18   Wt 97.5 kg   SpO2 96%   BMI 32.69 kg/m  Physical Exam CONSTITUTIONAL: Ill-appearing HEAD: Normocephalic/atraumatic EYES: EOMI/PERRL, scleral icterus noted ENMT: Mucous membranes moist NECK: supple no meningeal signs CV: S1/S2 noted, tachycardiac LUNGS: Tachypnea, decreased breath sounds bilaterally ABDOMEN: soft, distended, fluid wave noted GU: Significant edema to the scrotum NEURO: Pt is awake/alert/appropriate, moves all extremitiesx4.  No facial droop.   EXTREMITIES: pulses normal/equal, full ROM Diffuse anasarca noted SKIN: warm, color normal PSYCH: Anxious ED Results / Procedures / Treatments   Labs (all labs ordered are listed, but only abnormal results are displayed) Labs Reviewed  CBC WITH DIFFERENTIAL/PLATELET - Abnormal; Notable for the following components:      Result Value   WBC 1.7 (*)    RBC 2.42 (*)    Hemoglobin 9.2 (*)    HCT 26.0 (*)    MCV 107.4 (*)    MCH 38.0 (*)  Platelets 79 (*)    nRBC 1.8 (*)    Neutro Abs 1.2 (*)    Lymphs Abs 0.3 (*)    All other components within normal limits  COMPREHENSIVE METABOLIC PANEL - Abnormal; Notable for the following components:   Sodium 120 (*)    Chloride 90 (*)    CO2 18 (*)    Glucose, Bld 39 (*)    BUN 33 (*)    Creatinine, Ser 1.98 (*)    Calcium 7.9 (*)    Total Protein 5.1 (*)    Albumin 1.6 (*)    AST 61 (*)    Total Bilirubin 10.6 (*)    GFR, Estimated 40 (*)    All other components within normal limits  PROTIME-INR - Abnormal; Notable for the  following components:   Prothrombin Time 24.4 (*)    INR 2.2 (*)    All other components within normal limits  LACTIC ACID, PLASMA - Abnormal; Notable for the following components:   Lactic Acid, Venous 4.0 (*)    All other components within normal limits  RESP PANEL BY RT-PCR (FLU A&B, COVID) ARPGX2  CULTURE, BLOOD (ROUTINE X 2)  CULTURE, BLOOD (ROUTINE X 2)  URINE CULTURE  BODY FLUID CULTURE W GRAM STAIN  BRAIN NATRIURETIC PEPTIDE  AMMONIA  MAGNESIUM  LACTIC ACID, PLASMA  APTT  URINALYSIS, ROUTINE W REFLEX MICROSCOPIC  LACTATE DEHYDROGENASE, PLEURAL OR PERITONEAL FLUID  GLUCOSE, PLEURAL OR PERITONEAL FLUID  PROTEIN, PLEURAL OR PERITONEAL FLUID  ALBUMIN, PLEURAL OR PERITONEAL FLUID   BODY FLUID CELL COUNT WITH DIFFERENTIAL  PATHOLOGIST SMEAR REVIEW  CBG MONITORING, ED  TYPE AND SCREEN  TROPONIN I (HIGH SENSITIVITY)  TROPONIN I (HIGH SENSITIVITY)    EKG EKG Interpretation  Date/Time:  Thursday March 29 2022 05:09:45 EDT Ventricular Rate:  110 PR Interval:  158 QRS Duration: 72 QT Interval:  322 QTC Calculation: 435 R Axis:   87 Text Interpretation: Sinus tachycardia Nonspecific T wave abnormality Abnormal ECG Confirmed by Zadie Rhine ([id number]) on 03/03/2022 5:24:36 AM  Radiology DG Chest Portable 1 View  Result Date: 03/26/2022 CLINICAL DATA:  51 year old male with shortness of breath and weakness. EXAM: PORTABLE CHEST 1 VIEW COMPARISON:  03/02/2022 and earlier. FINDINGS: Portable AP semi upright view at 0515 hours. Continued low lung volumes. Mediastinal contours are stable, heart size at the upper limits of normal. Indistinct left lung base opacity, with small pleural effusions suspected previously. No pneumothorax or consolidation. Pulmonary vascularity is stable with no acute edema. No definite right pleural effusion. Paucity of bowel gas. Prior cervical ACDF. IMPRESSION: Continued low lung volumes with indistinct left lung base opacity which may reflect ongoing  or increased left pleural effusion since earlier this month. No pulmonary edema. Electronically Signed   By: Odessa Fleming M.D.   On: 03/03/2022 05:40    Procedures .Critical Care  Performed by: Zadie Rhine, MD Authorized by: Zadie Rhine, MD   Critical care provider statement:    Critical care time (minutes):  75   Critical care start time:  03/21/2022 5:45 AM   Critical care end time:  03/20/2022 7:10 AM   Critical care time was exclusive of:  Separately billable procedures and treating other patients   Critical care was necessary to treat or prevent imminent or life-threatening deterioration of the following conditions:  Respiratory failure, shock and sepsis   Critical care was time spent personally by me on the following activities:  Development of treatment plan with patient or surrogate, evaluation of  patient's response to treatment, examination of patient, re-evaluation of patient's condition, pulse oximetry, ordering and review of radiographic studies, ordering and review of laboratory studies, review of old charts, obtaining history from patient or surrogate, discussions with consultants and ordering and performing treatments and interventions   I assumed direction of critical care for this patient from another provider in my specialty: no     Care discussed with: admitting provider   ABDOMINAL PARACENTESIS  Date/Time: 04-02-22 6:55 AM  Performed by: Ripley Fraise, MD Authorized by: Ripley Fraise, MD  Consent: The procedure was performed in an emergent situation. Preparation: Patient was prepped and draped in the usual sterile fashion. Local anesthesia used: no  Anesthesia: Local anesthesia used: no  Sedation: Patient sedated: no  Patient tolerance: patient tolerated the procedure well with no immediate complications Comments: Patient presented in likely septic shock with history of cirrhosis and obvious ascites.  Concern for spontaneous bacterial peritonitis.   Limited bedside paracentesis was performed.  Area was sterilized with Betadine.  Ultrasound was used for guidance.  No immediate complications.   Ultrasound ED Echo  Date/Time: 04-02-22 6:56 AM  Performed by: Ripley Fraise, MD Authorized by: Ripley Fraise, MD   Procedure details:    Indications: chest pain and hypotension     Views: parasternal long axis view     Images: archived   Findings:    Pericardium: no pericardial effusion     Cardiac Activity: hyperdynamic       Medications Ordered in ED Medications  lactated ringers bolus 1,000 mL (1,000 mLs Intravenous New Bag/Given 04-02-2022 0642)    And  lactated ringers bolus 1,000 mL (has no administration in time range)    And  lactated ringers bolus 1,000 mL (has no administration in time range)  sodium chloride 0.9 % bolus 500 mL (0 mLs Intravenous Stopped April 02, 2022 0539)  sodium chloride 0.9 % bolus 1,000 mL (0 mLs Intravenous Stopped 2022-04-02 0642)  cefTRIAXone (ROCEPHIN) 2 g in sodium chloride 0.9 % 100 mL IVPB (2 g Intravenous New Bag/Given 04/02/2022 0618)  fentaNYL (SUBLIMAZE) injection 25 mcg (25 mcg Intravenous Given 04/02/22 0612)  dextrose 50 % solution (50 mLs  Given 04-02-2022 3762)    ED Course/ Medical Decision Making/ A&P Clinical Course as of 04-02-2022 0730  Apr 02, 2022 Patient was seen in the emergency room yesterday for abdominal fullness and consider paracentesis but declined it and went home.  Since that time his symptoms are worsened.  Patient is now hypotensive and is ill-appearing. [DW]  8315 Glucose(!!): 39 [DW]  0626 Hypoglycemia [DW]  0626 Total Bilirubin(!): 10.6 Hyperbilirubinemia [DW]  0626 Creatinine(!): 1.98 Acute kidney injury [DW]  0626 INR(!): 2.2 Coagulopathy [DW]  0653 WBC(!): 1.7 Leukopenia noted [DW]  1761 Patient with significant hemodynamic changes since yesterday's ER visit.  He is now persistently hypotensive.  Code sepsis has been called.  He has been given IV fluids and  IV antibiotics.  SBP is a consideration, peritoneal fluid has been sent.  I did a bedside ultrasound which did not reveal any obvious pericardial effusion [DW]  0654 Patient does have a previous history of pulmonary embolism.  He would benefit from stat CT chest to evaluate as a cause of his hypotension.  We will also add on stat CT abdomen pelvis [DW]  0712 I have consulted critical care for this patient [DW]  0717 Patient has returned from [place].  Current blood pressure is 607 systolic.  He did become dyspneic and hypoxic  after lying flat for CT.  He has been given supplemental oxygen and his respiratory rate and oxygenation are improving [DW]    Clinical Course User Index [DW] Zadie Rhine, MD                           Medical Decision Making Amount and/or Complexity of Data Reviewed Labs: ordered. Decision-making details documented in ED Course. Radiology: ordered.  Risk Prescription drug management. Decision regarding hospitalization.   This patient presents to the ED for concern of shortness of breath, this involves an extensive number of treatment options, and is a complaint that carries with it a high risk of complications and morbidity.  The differential diagnosis includes but is not limited to Acute coronary syndrome, pneumonia, acute pulmonary edema, pneumothorax, acute anemia, pulmonary embolism, pericardial effusion    Comorbidities that complicate the patient evaluation: Patient's presentation is complicated by their history of cirrhosis  Social Determinants of Health: Patient's  history of alcohol use disorder   increases the complexity of managing their presentation  Additional history obtained: Records reviewed previous admission documents  Lab Tests: I Ordered, and personally interpreted labs.  The pertinent results include: Hyperbilirubinemia, hypoglycemia, acute kidney injury, lactic acidosis  Imaging Studies ordered: I ordered imaging studies including X-ray  chest   I independently visualized and interpreted imaging which showed no acute findings I agree with the radiologist interpretation  Cardiac Monitoring: The patient was maintained on a cardiac monitor.  I personally viewed and interpreted the cardiac monitor which showed an underlying rhythm of:  sinus tachycardia  Medicines ordered and prescription drug management: I ordered medication including IV fluids and IV antibiotics for presumed sepsis Reevaluation of the patient after these medicines showed that the patient    stayed the same   Critical Interventions:  IV fluids and antibiotics  Consultations Obtained: I requested consultation with the admitting physician critical care , and discussed  findings as well as pertinent plan - they recommend: admit  Reevaluation: After the interventions noted above, I reevaluated the patient and found that they have :stayed the same  Complexity of problems addressed: Patient's presentation is most consistent with  acute presentation with potential threat to life or bodily function  Disposition: After consideration of the diagnostic results and the patient's response to treatment,  I feel that the patent would benefit from admission   .           Final Clinical Impression(s) / ED Diagnoses Final diagnoses:  Severe sepsis with septic shock (HCC)  AKI (acute kidney injury) (HCC)  Ascites due to alcoholic cirrhosis [facility])    Rx / DC Orders ED Discharge Orders     None         Zadie Rhine, MD 04-27-2022 [phone]

## 2022-03-29 NOTE — Progress Notes (Signed)
PHARMACY - PHYSICIAN COMMUNICATION CRITICAL VALUE ALERT - BLOOD CULTURE IDENTIFICATION (BCID)  Jared Bass is an 51 y.o. male who presented to [facility] on 03/14/2022 with a chief complaint of septic shock.   Assessment:  4/4 BCX + Klebsiella with CTX-M   Name of physician (or Provider) Contacted: Salvadore Dom, NP and Dr. Carlis Abbott   Current antibiotics: cefepime/vancomycin   Changes to prescribed antibiotics recommended:  Current antibiotics are inappropriate, recommended change to meropenem.   [name] 03/13/2022  7:02 PM

## 2022-03-29 NOTE — Progress Notes (Signed)
Progressive respiratory decline. Pulm edema with enlarging effusions on post-CVC CXR. Requiring NRB to maintain SpO2 >88%. NP Kary Kos called sister to update her and discuss out concern for a poor prognosis. He has been confused and has not been able to discuss his own goals of care effective since admission unfortunately. She understands his prognosis if he has prolonged critical illness is poor and would likely disqualify him from continuing to be a liver transplant candidate. Code status DNR, will plan for 3 days of MV and reassess. No plans to escalate care significantly further if he develops worsened organ failures.  Increasing NE and vasopressin added before intubation. Intubated in 2 attempts by NP Iona Beard. See separate intubation note.  CXR ordered. PRN fentanyl for sedation. With severe encephalopathy trying to avoid continuous sedation unless his mentation improves. Guarded prognosis.  Additional critical care time: 30 min.  Julian Hy, DO 03/09/2022 1:43 PM [facility]

## 2022-03-29 NOTE — Progress Notes (Signed)
Not meeting oxygenation goals on 90%, PEEP 8. Plat 32 Increase PEEP to 10, Vt decrease to 480cc, RR increase 30. Pplat 29. ABG in 30 min.   Maxed on NE & vaso. Adding epi PRN and stress dose steroids.   Urology placed foley and urine culture sent.  Very guarded prognosis.  Additional cc time: 10 min.  Julian Hy, DO 04/15/2022 4:55 PM [facility]

## 2022-03-29 NOTE — Progress Notes (Signed)
LA continues to rise 4>4>4.2 despite antibiotics. Escalating to cefepime & vanc. Now borderline BPs, pressors ordered if MAP <65  Placing CVC for lab draws, accuchecks (questionable reliability of peripheral sticks), multiple medications, vasopressors.  UA and urine still not collected> I have asked RN to please straight cath for urine sample. Watching mental status and respiratory status closely.  Julian Hy, DO 2022/04/11 12:12 PM [facility]

## 2022-03-29 NOTE — Progress Notes (Signed)
Pharmacy Antibiotic Note  Jared Bass is a 51 y.o. male admitted on 03/15/2022 with bacteremia.  Pharmacy has been consulted for meropenem dosing.  Patient has 4/4 bcx + for ESBL kleb pneumo.   Plan: Meropenem 1g Q8H.  Follow culture data for de-escalation.  Monitor renal function for dose adjustments as indicated.    Weight: 97.5 kg (215 lb)  Temp (24hrs), Avg:97 F (36.1 C), Min:94 F (34.4 C), Max:98.3 F (36.8 C)  Recent Labs  Lab 03/28/22 0821 03/03/2022 0515 03/31/2022 0612 03/16/2022 0739 03/20/2022 1014 03/06/2022 1230 03/27/2022 1646  WBC 6.7 1.7*  --   --   --   --   --   CREATININE 1.22 1.98*  --   --   --   --  1.74*  LATICACIDVEN  --   --  4.0* 4.0* 4.2* 4.0*  --     Estimated Creatinine Clearance: 55.6 mL/min (A) (by C-G formula based on SCr of 1.74 mg/dL (H)).    Allergies  Allergen Reactions   Tramadol Anaphylaxis and Rash   Other Swelling and Other (See Comments)    Rembrandt Whitening strips - "fat lips"   Thank you for allowing pharmacy to be a part of this patient's care.  [name] 03/14/2022 7:11 PM

## 2022-03-29 NOTE — Procedures (Signed)
Central Venous Catheter Insertion Procedure Note  Jared Bass  [id number]  20-Sep-1970  Date:03/20/2022  Time:12:32 PM   Provider Performing:Enis Leatherwood R Jared Bass   Procedure: Insertion of Non-tunneled Central Venous [phone]) with US guidance [phone])  Assisted medical student with placement  Indication(s) Medication administration  Consent Risks of the procedure as well as the alternatives and risks of each were explained to the patient and/or caregiver.  Consent for the procedure was obtained and is signed in the bedside chart  Anesthesia Topical only with 1% lidocaine   Timeout Verified patient identification, verified procedure, site/side was marked, verified correct patient position, special equipment/implants available, medications/allergies/relevant history reviewed, required imaging and test results available.  Sterile Technique Maximal sterile technique including full sterile barrier drape, hand hygiene, sterile gown, sterile gloves, mask, hair covering, sterile ultrasound probe cover (if used).  Procedure Description Area of catheter insertion was cleaned with chlorhexidine and draped in sterile fashion.  With real-time ultrasound guidance a central venous catheter was placed into the right internal jugular vein. Nonpulsatile blood flow and easy flushing noted in all ports.  The catheter was sutured in place and sterile dressing applied.  Complications/Tolerance None; patient tolerated the procedure well. Chest X-ray is ordered to verify placement for internal jugular or subclavian cannulation.   Chest x-ray is not ordered for femoral cannulation.  EBL Minimal  Specimen(s) None  Jared Carpen Huxley Vanwagoner, PA-C

## 2022-03-29 NOTE — Progress Notes (Signed)
Pharmacy Antibiotic Note  Jared Bass is a 51 y.o. male admitted on 03/13/2022 with sepsis.  Pharmacy has been consulted for Cefepime and Vancomycin dosing.  Here with decompensated cirrhosis and ascites.  AKI - SCr up from 1.22 to 1.98. PCT 8.16. LA 4.2 (trend up). Afebrile.   Plan: Vancomycin 2000 mg IV  x 1 then monitor renal function for further dosing.  Cefepime 2g IV every 12 hours.   Weight: 97.5 kg (215 lb)  Temp (24hrs), Avg:98 F (36.7 C), Min:97.8 F (36.6 C), Max:98.3 F (36.8 C)  Recent Labs  Lab 03/28/22 0821 03/06/2022 0515 03/03/2022 0612 03/07/2022 0739 03/05/2022 1014  WBC 6.7 1.7*  --   --   --   CREATININE 1.22 1.98*  --   --   --   LATICACIDVEN  --   --  4.0* 4.0* 4.2*    Estimated Creatinine Clearance: 48.8 mL/min (A) (by C-G formula based on SCr of 1.98 mg/dL (H)).    Allergies  Allergen Reactions   Tramadol Anaphylaxis and Rash   Other Swelling and Other (See Comments)    Rembrandt Whitening strips - "fat lips"    Antimicrobials this admission: Ceftriaxone 9/28 x2 doses Cefepime 9/28 >> Vancomycin 9/28 >>  Dose adjustments this admission:   Microbiology results: 9/28 BCx:  9/28 UCx:   9/28 Peritoneal fluid cx: 9/28 MRSA PCR:   Thank you for allowing pharmacy to be a part of this patient's care.  Sloan Leiter, PharmD, BCPS, BCCCP Clinical Pharmacist Please refer to [facility] for [place] numbers 03/28/2022 12:29 PM

## 2022-03-29 NOTE — ED Notes (Signed)
Dr Christy Gentles aware of Glucose

## 2022-03-29 NOTE — Progress Notes (Addendum)
Pharmacy Antibiotic Note  Jared Bass is a 51 y.o. male admitted on 04-08-22 with SBP.  Pharmacy has been consulted for Ceftriaxone dosing. PMH includes ETOH related cirrhosis with associated portal HTN, esophageal varices, anasarca, chronic hyponatremia, and recurrent ascites. Presented with abdominal distention and discomfort, worsening SOB. WBC 1.7, Scr 1.98 (bl~1-1.2), afebrile Cultures pending for blood, urine, and body fluid.  Plan: Ceftriaxone  2g IV q24h Monitor for signs of clinical improvements, WBC, cultures, and fever curve.  Weight: 97.5 kg (215 lb)  Temp (24hrs), Avg:98.3 F (36.8 C), Min:98.2 F (36.8 C), Max:98.3 F (36.8 C)  Recent Labs  Lab 03/28/22 0821 08-Apr-2022 0515 04-08-22 0612  WBC 6.7 1.7*  --   CREATININE 1.22 1.98*  --   LATICACIDVEN  --   --  4.0*    Estimated Creatinine Clearance: 48.8 mL/min (A) (by C-G formula based on SCr of 1.98 mg/dL (H)).    Allergies  Allergen Reactions   Tramadol Anaphylaxis and Rash   Other Swelling and Other (See Comments)    Rembrandt Whitening strips - "fat lips"    Antimicrobials this admission: Ceftriaxone 9/28 >>   Microbiology results: 9/28 BCx:  9/28 UCx:   9/28 Body Fluid culture:   Thank you for allowing pharmacy to be a part of this patient's care.  Sandford Craze, PharmD. [facility] Acute [facility] PGY-1  2022-04-08 8:51 AM

## 2022-03-29 NOTE — IPAL (Signed)
  Interdisciplinary Goals of Care Family Meeting   Date carried out:: 2022-04-19  Location of the meeting: Phone conference  Member's involved: Nurse Practitioner and Family Member or next of kin  Durable Power of Attorney or acting medical decision maker:   Discussion: We discussed goals of care for Jared Bass .  [name]  Code status: Limited Code or DNR with short term, Mechanical ventilation, Cental IV access, IV vasoactive drugs, and IV antiarrhythmic  Disposition: Continue current acute care   Time spent for the meeting: 23 min   DNR if arrests. Short term ventilation. Pressors OK. No CPR. No HD. Family understands he may not survive.   Clementeen Graham 04/19/22, 1:49 PM

## 2022-03-29 NOTE — Progress Notes (Signed)
[facility] Progress Note Patient Name: Jared Bass DOB: September 19, 1970 MRN: [id number]   Date of Service  04-17-2022  HPI/Events of Note  Multiple issues: 1. Hypoglycemia - Blood glucose = 58. Already given D50. 2. Hypotension - BP = 83/52. 3. Hypoxia - Sat = 83% on 100%/P 14. Albumin = 1.6. CVP = 23. Hgb = 7.5. Unable to diurese d/t hypotension. Unable to increase PEEP d/t hemodynamic instability.   eICU Interventions  Plan: Increase D10W IV infusion to 125 mL/hour. Increase ceiling on Norepinephrine IV infusion to 80 mcg/min. NaHCO3 100 meq IV now.  Observe for improvement of sats with increased perfusion to lungs.  Will ask PCCM ground team to evaluate patient at bedside to see if they have any further ideas.     Intervention Category Major Interventions: Other:;Acid-Base disturbance - evaluation and management;Hypotension - evaluation and management  Lysle Dingwall 17-Apr-2022, 11:13 PM

## 2022-03-29 NOTE — H&P (Signed)
NAME:  Jared Bass, MRN:  [id number], DOB:  1970-11-07, LOS: 0 ADMISSION DATE:  03/27/2022, CONSULTATION DATE:  9/28 REFERRING MD:  Christy Gentles, CHIEF COMPLAINT:    History of Present Illness:  This is a 51 year old male patient with significant history of EtOH related cirrhosis with associated portal hypertension, esophageal varices, anasarca, chronic hyponatremia, and recurrent ascites requiring frequent paracentesis (last paracentesis approximately 2 weeks prior to presentation), also history of recurrent SBP.  Initially presented to the emergency room on 9/27 with a chief complaint of abdominal distention And discomfort he felt he needed to have a paracentesis at that time.  There is a significant weight so he decided to leave.  Then early in the morning on 9/28 had worsening abdominal discomfort, reporting it as pain all over.  And worsening associated shortness of breath, significant weakness, and chills.  He denied chest pain, states his weakness is so bad he cannot walk and nursing reports he was barely able to pivot into the bed with assistance.  Initial systolic blood pressure was in the 70s, he was tachycardic with heart rate in the 100s..  Lab evaluation: White blood cell count 1.7 down from 6.7, platelets 79, sodium 128, baseline around 123-125.  Glucose 39, creatinine 1.98 up from 1.22. Blood cultures were sent, paracentesis was obtained in emergency room, fluid was sent for culture and evaluation Ceftriaxone started He received 4 L of crystalloid and remained hypotensive and therefore critical care asked to admit.  Pertinent  Medical History  EtOH related cirrhosis (MELD score 29) with ascites and esophageal varices also history of hyponatremia.  Also history of EtOH related hepatitis.  And recurrent spontaneous bacterial peritonitis he stopped drinking in 2021 requires frequent paracentesis.  Followed at [facility], currently on transplant list History of pulmonary emboli History of atrial  fibrillation, hypertension, cellulitis Anasarca Chronic thrombocytopenia Significant Hospital Events: Including procedures, antibiotic start and stop dates in addition to other pertinent events   9/28: Admitted with chief complaint of new onset abdominal pain, shortness of breath, chills.  Admitted with working diagnosis of spontaneous bacterial peritonitis, paracentesis completed in emergency room, culture sent, empiric ceftriaxone initiated.  CT imaging: Advanced changes of cirrhosis with portal venous hypertension esophageal varices and large volume abdominal/pelvic ascites.  Small scattered low-attenuation liver lesions negative for pulmonary emboli but study limited.  There was a moderate size left pleural effusion with associated atelectasis moderate overt pulmonary edema diffuse anasarca  Interim History / Subjective:  Endorses abdominal pain and shortness of breath  Objective   Blood pressure 104/75, pulse (Abnormal) 114, temperature 98.3 F (36.8 C), temperature source Oral, resp. rate (Abnormal) 28, weight 97.5 kg, SpO2 (Abnormal) 74 %.       No intake or output data in the 24 hours ending 03/31/2022 0831 Filed Weights   03/14/2022 0502  Weight: 97.5 kg    Examination: General: chronically ill-appearing 51 year old male currently in no acute distress, however does endorse abdominal pain and dyspnea HENT: Mucous membranes are dry neck veins flat he does exhibit fairly significant temporal wasting Lungs: Clear diminished bases slightly tachypneic Cardiovascular: Tachycardic regular rhythm without murmur rub or gallop Abdomen: Distended, firm, shifting dullness to percussion hypoactive bowel sounds Extremities: Diffuse 4+ anasarca with mild erythemic rash over abdominal fold/groin Neuro: Awake oriented generalized weakness GU: Due to void, marked scrotal edema  Resolved Hospital Problem list     Assessment & Plan:  Principal Problem:   SBP (spontaneous bacterial peritonitis)  ([place]) Active Problems:   Secondary esophageal  varices without bleeding (HCC)   Portal hypertensive gastropathy (HCC)   Alcoholic cirrhosis of liver with ascites (HCC)   Generalized abdominal pain   Septic shock (HCC)   AKI (acute kidney injury) ([place])   Hyponatremia   Lactic acidosis   Anasarca   Dyspnea   Pleural effusion on left   Coagulopathy (HCC)   Hypoglycemia   Unspecified atrial fibrillation (HCC)   Thrombocytopenia (HCC)   Generalized anxiety disorder   Opioid use disorder, moderate, dependence (HCC)   GERD (gastroesophageal reflux disease)   Severe protein-calorie malnutrition (HCC)  Septic shock, presumptively secondary to spontaneous bacterial peritonitis -Cultures have been sent, diagnostic paracentesis completed in emergency room with fluid evaluation sent Plan Admit to the intensive care Continuing IV fluid maintenance at 75 cc an hour, has already received 4 L of crystalloid IV albumin Ceftriaxone day 1, follow-up cultures Hold off on further paracentesis for now until hemodynamically stable May need norepinephrine, will follow-up on lactic acid goal mean arterial pressure greater than 65  History of alcoholic cirrhosis, with portal hypertension, esophageal varices, ascites requiring frequent paracentesis, and anasarca stop drinking back in 2021 currently on transplant list at [facility].   Plan Treat sepsis Albumin as above Will likely need repeat paracentesis Holding IV Lasix, carvedilol, and spironolactone given septic shock  Lactic acidosis in the setting of septic shock Plan Repeating lactic acid status post fluid volume replacement, note may be slow to clear given underlying cirrhosis  AKI secondary to septic shock Plan Holding diuretics and antihypertensives Volume resuscitation Renal dose medications  Chronic hyponatremia in the setting of cirrhosis.  Urinalysis sent.  He is asymptomatic. Plan Follow-up chemistry Resume diuretics when able Will  need fluid and volume restriction following stabilization  Dyspnea, likely multifactorial primarily secondary to large volume abdominal ascites, but also has left pleural effusion Plan Pulse oximetry Supplemental oxygen Simple spirometry Resume diuretics when able Consider therapeutic paracentesis once hemodynamically stable  Chronic thrombocytopenia in the setting of cirrhosis Plan Continue to monitor Treat sepsis  History of GERD Plan PPI  Opioid dependence Plan Limiting narcotics given pain  Severe protein calorie malnutrition Plan Nutritional consult   Best Practice (right click and "Reselect all SmartList Selections" daily)   Diet/type: NPO w/ oral meds DVT prophylaxis: SCD GI prophylaxis: PPI Lines: N/A Foley:  N/A Code Status:  full code Last date of multidisciplinary goals of care discussion [sister was updated via phone.  Currently family will not be able to visit given concern for COVID infection goals of care verified with patient currently full resuscitation]  Labs   CBC: Recent Labs  Lab 03/28/22 0821 03/11/2022 0515  WBC 6.7 1.7*  NEUTROABS 4.3 1.2*  HGB 9.5* 9.2*  HCT 26.3* 26.0*  MCV 106.0* 107.4*  PLT 86* 79*    Basic Metabolic Panel: Recent Labs  Lab 03/28/22 0821 03/25/2022 0515  NA 123* 120*  K 4.1 4.4  CL 89* 90*  CO2 26 18*  GLUCOSE 120* 39*  BUN 26* 33*  CREATININE 1.22 1.98*  CALCIUM 8.1* 7.9*  MG 2.0 1.7  PHOS 4.0  --    GFR: Estimated Creatinine Clearance: 48.8 mL/min (A) (by C-G formula based on SCr of 1.98 mg/dL (H)). Recent Labs  Lab 03/28/22 0821 03/04/2022 0515 03/16/2022 0612  WBC 6.7 1.7*  --   LATICACIDVEN  --   --  4.0*    Liver Function Tests: Recent Labs  Lab 03/28/22 0821 03/19/2022 0515  AST 58* 61*  ALT 36 30  ALKPHOS  135* 109  BILITOT 10.9* 10.6*  PROT 6.0* 5.1*  ALBUMIN 1.8* 1.6*   Recent Labs  Lab 03/28/22 0821  LIPASE 59*   Recent Labs  Lab 03/28/22 0812 03/28/2022 0515  AMMONIA 17 29     ABG    Component Value Date/Time   TCO2 26 12/12/2015 2303     Coagulation Profile: Recent Labs  Lab 03/28/22 0821 03/16/2022 0515  INR 2.1* 2.2*    Cardiac Enzymes: No results for input(s): "CKTOTAL", "CKMB", "CKMBINDEX", "TROPONINI" in the last 168 hours.  HbA1C: Hgb A1c MFr Bld  Date/Time Value Ref Range Status  12/13/2015 02:34 AM 5.4 4.8 - 5.6 % Final    Comment:    (NOTE)         Pre-diabetes: 5.7 - 6.4         Diabetes: >6.4         Glycemic control for adults with diabetes: <7.0     CBG: Recent Labs  Lab 03/15/2022 0757 03/06/2022 0806  GLUCAP 24* 218*    Review of Systems:   Abdominal pain, fever, chills.  Profound weakness.  All as mentioned above in HPI  Past Medical History:  He,  has a past medical history of Acute renal failure ([place]), Acute respiratory failure ([place]), Acute respiratory failure with hypoxia ([place]), Acute saddle pulmonary embolism with acute cor pulmonale ([place]), Cataract, Chronic neck pain, Closed fracture of nasal bone, DDD (degenerative disc disease), cervical (05/04/2015), Faintness, Hemoptysis, History of atrial fibrillation, History of head injury (05/04/2015), History of tachycardia (05/04/2015), Hypertension, Hypotension, Liver cirrhosis ([place]), Pulmonary emboli ([place]), Pulmonary embolism ([place]), Right eye injury, Saddle pulmonary embolus ([place]) (12/13/2015), Substance abuse ([place]), Syncope and collapse, and Tachycardia (12/20/2015).   Surgical History:   Past Surgical History:  Procedure Laterality Date   ESOPHAGOGASTRODUODENOSCOPY (EGD) WITH PROPOFOL N/A 01/29/2020   Procedure: ESOPHAGOGASTRODUODENOSCOPY (EGD) WITH PROPOFOL;  Surgeon: Lavena Bullion, DO;  Location: [facility];  Service: Gastroenterology;  Laterality: N/A;   IR PARACENTESIS  01/29/2020   IR PARACENTESIS  12/27/2020   neck fusion     c5-7   TONSILLECTOMY     UPPER GASTROINTESTINAL ENDOSCOPY       Social History:   reports that he has never smoked. He has  never used smokeless tobacco. He reports that he does not drink alcohol and does not use drugs.   Family History:  His family history includes COPD in his father and mother; Hypertension in his mother; Mesothelioma in his father; Other in his sister; Peripheral vascular disease in his brother. There is no history of Colon cancer, Stomach cancer, Esophageal cancer, or Rectal cancer.   Allergies Allergies  Allergen Reactions   Tramadol Anaphylaxis and Rash   Other Swelling and Other (See Comments)    Rembrandt Whitening strips - "fat lips"     Home Medications  Prior to Admission medications   Medication Sig Start Date End Date Taking? Authorizing Provider  albuterol (VENTOLIN HFA) 108 (90 Base) MCG/ACT inhaler INHALE 2 PUFFS INTO THE LUNGS EVERY 6 HOURS AS NEEDED (COUGH). Patient taking differently: Inhale 2 puffs into the lungs every 6 (six) hours as needed for wheezing or shortness of breath (or coughing). 05/04/19  Yes Sid Falcon, MD  amphetamine-dextroamphetamine (ADDERALL) 20 MG tablet Take 20 mg by mouth 2 (two) times daily. 12/15/21  Yes [provider]  carvedilol (COREG) 6.25 MG tablet Take 6.25 mg by mouth 2 (two) times daily. 03/22/22  Yes [provider]  ciprofloxacin (CIPRO) 500 MG  tablet Take 1 tablet (500 mg total) by mouth daily. 03/07/22  Yes Cherene Altes, MD  furosemide (LASIX) 40 MG tablet Take 1 tablet (40 mg total) by mouth 2 (two) times daily. Patient taking differently: Take 20 mg by mouth daily. 03/06/22  Yes Cherene Altes, MD  polyethylene glycol (MIRALAX / GLYCOLAX) packet Take 17 g by mouth daily. Patient taking differently: Take 17 g by mouth daily as needed for mild constipation (mix and drink). 07/30/17  Yes Lorella Nimrod, MD  Multiple Vitamins-Minerals (MULTI + OMEGA-3 ADULT GUMMIES PO) Take 1 tablet by mouth daily. Patient not taking: Reported on 03/23/2022    [provider]  spironolactone (ALDACTONE) 50 MG tablet Take 1  tablet (50 mg total) by mouth daily. Patient not taking: Reported on 03/02/2022 01/01/21   Elmarie Shiley, MD     Critical care time: 39 minutes    Erick Colace ACNP-BC [place] Pager # [phone] OR # [phone] if no answer

## 2022-03-30 ENCOUNTER — Inpatient Hospital Stay (HOSPITAL_COMMUNITY): Payer: Medicare Other

## 2022-03-30 DIAGNOSIS — R6521 Severe sepsis with septic shock: Secondary | ICD-10-CM | POA: Diagnosis not present

## 2022-03-30 DIAGNOSIS — A419 Sepsis, unspecified organism: Secondary | ICD-10-CM | POA: Diagnosis not present

## 2022-03-30 DIAGNOSIS — K652 Spontaneous bacterial peritonitis: Secondary | ICD-10-CM | POA: Diagnosis not present

## 2022-03-30 LAB — CBC
HCT: 25.6 % — ABNORMAL LOW (ref 39.0–52.0)
Hemoglobin: 8.5 g/dL — ABNORMAL LOW (ref 13.0–17.0)
MCH: 36.8 pg — ABNORMAL HIGH (ref 26.0–34.0)
MCHC: 33.2 g/dL (ref 30.0–36.0)
MCV: 110.8 fL — ABNORMAL HIGH (ref 80.0–100.0)
Platelets: 35 10*3/uL — ABNORMAL LOW (ref 150–400)
RBC: 2.31 MIL/uL — ABNORMAL LOW (ref 4.22–5.81)
RDW: 18.7 % — ABNORMAL HIGH (ref 11.5–15.5)
WBC: 2.5 10*3/uL — ABNORMAL LOW (ref 4.0–10.5)
nRBC: 1.2 % — ABNORMAL HIGH (ref 0.0–0.2)

## 2022-03-30 LAB — POCT I-STAT 7, (LYTES, BLD GAS, ICA,H+H)
Acid-base deficit: 14 mmol/L — ABNORMAL HIGH (ref 0.0–2.0)
Acid-base deficit: 17 mmol/L — ABNORMAL HIGH (ref 0.0–2.0)
Bicarbonate: 11.3 mmol/L — ABNORMAL LOW (ref 20.0–28.0)
Bicarbonate: 13.6 mmol/L — ABNORMAL LOW (ref 20.0–28.0)
Calcium, Ion: 1.07 mmol/L — ABNORMAL LOW (ref 1.15–1.40)
Calcium, Ion: 1.1 mmol/L — ABNORMAL LOW (ref 1.15–1.40)
HCT: 19 % — ABNORMAL LOW (ref 39.0–52.0)
HCT: 21 % — ABNORMAL LOW (ref 39.0–52.0)
Hemoglobin: 6.5 g/dL — CL (ref 13.0–17.0)
Hemoglobin: 7.1 g/dL — ABNORMAL LOW (ref 13.0–17.0)
O2 Saturation: 88 %
O2 Saturation: 90 %
Patient temperature: 35.2
Patient temperature: 35.5
Potassium: 4.3 mmol/L (ref 3.5–5.1)
Potassium: 4.3 mmol/L (ref 3.5–5.1)
Sodium: 123 mmol/L — ABNORMAL LOW (ref 135–145)
Sodium: 123 mmol/L — ABNORMAL LOW (ref 135–145)
TCO2: 12 mmol/L — ABNORMAL LOW (ref 22–32)
TCO2: 15 mmol/L — ABNORMAL LOW (ref 22–32)
pCO2 arterial: 35.7 mmHg (ref 32–48)
pCO2 arterial: 37.1 mmHg (ref 32–48)
pH, Arterial: 7.098 — CL (ref 7.35–7.45)
pH, Arterial: 7.162 — CL (ref 7.35–7.45)
pO2, Arterial: 62 mmHg — ABNORMAL LOW (ref 83–108)
pO2, Arterial: 73 mmHg — ABNORMAL LOW (ref 83–108)

## 2022-03-30 LAB — BASIC METABOLIC PANEL
Anion gap: 19 — ABNORMAL HIGH (ref 5–15)
BUN: 37 mg/dL — ABNORMAL HIGH (ref 6–20)
CO2: 11 mmol/L — ABNORMAL LOW (ref 22–32)
Calcium: 7.3 mg/dL — ABNORMAL LOW (ref 8.9–10.3)
Chloride: 94 mmol/L — ABNORMAL LOW (ref 98–111)
Creatinine, Ser: 2.29 mg/dL — ABNORMAL HIGH (ref 0.61–1.24)
GFR, Estimated: 33 mL/min — ABNORMAL LOW (ref >60)
Glucose, Bld: 102 mg/dL — ABNORMAL HIGH (ref 70–99)
Potassium: 4.5 mmol/L (ref 3.5–5.1)
Sodium: 124 mmol/L — ABNORMAL LOW (ref 135–145)

## 2022-03-30 LAB — PROCALCITONIN: Procalcitonin: 36.86 ng/mL

## 2022-03-30 LAB — GLUCOSE, CAPILLARY
Glucose-Capillary: 118 mg/dL — ABNORMAL HIGH (ref 70–99)
Glucose-Capillary: 75 mg/dL (ref 70–99)
Glucose-Capillary: 96 mg/dL (ref 70–99)
Glucose-Capillary: 99 mg/dL (ref 70–99)
Glucose-Capillary: 88 mg/dL (ref 70–99)

## 2022-03-30 LAB — HIV ANTIBODY (ROUTINE TESTING W REFLEX): HIV Screen 4th Generation wRfx: NONREACTIVE

## 2022-03-30 LAB — MAGNESIUM: Magnesium: 1.8 mg/dL (ref 1.7–2.4)

## 2022-03-30 LAB — PREPARE RBC (CROSSMATCH)

## 2022-03-30 LAB — PHOSPHORUS: Phosphorus: 8.9 mg/dL — ABNORMAL HIGH (ref 2.5–4.6)

## 2022-03-30 MED ORDER — METHYLENE BLUE 1 % INJ SOLN
2.0000 mg/kg | Freq: Once | Status: AC
  Administered 2022-03-30: 195 mg via INTRAVENOUS
  Filled 2022-03-30: qty 19.5

## 2022-03-30 MED ORDER — FENTANYL CITRATE PF 50 MCG/ML IJ SOSY: 50.0000 ug | PREFILLED_SYRINGE | Freq: Once | INTRAMUSCULAR | Status: DC

## 2022-03-30 MED ORDER — FENTANYL BOLUS VIA INFUSION
50.0000 ug | INTRAVENOUS | Status: DC | PRN
  Administered 2022-03-30: 50 ug via INTRAVENOUS

## 2022-03-30 MED ORDER — FENTANYL 2500MCG IN NS 250ML (10MCG/ML) PREMIX INFUSION
50.0000 ug/h | INTRAVENOUS | Status: DC
  Administered 2022-03-30: 50 ug/h via INTRAVENOUS
  Filled 2022-03-30: qty 250

## 2022-03-30 MED ORDER — SODIUM BICARBONATE 8.4 % IV SOLN: 100.0000 meq | Freq: Once | INTRAVENOUS | Status: DC

## 2022-03-30 MED ORDER — SODIUM BICARBONATE 8.4 % IV SOLN
50.0000 meq | Freq: Once | INTRAVENOUS | Status: AC
  Administered 2022-03-30: 50 meq via INTRAVENOUS
  Filled 2022-03-30: qty 50

## 2022-03-30 MED ORDER — SODIUM CHLORIDE 0.9% IV SOLUTION: Freq: Once | INTRAVENOUS | Status: AC

## 2022-03-31 LAB — PATHOLOGIST SMEAR REVIEW

## 2022-03-31 LAB — TYPE AND SCREEN
ABO/RH(D): A POS
Antibody Screen: NEGATIVE
Unit division: 0

## 2022-03-31 LAB — BPAM RBC
Blood Product Expiration Date: 202310132359
ISSUE DATE / TIME: 202309290208
Unit Type and Rh: 6200

## 2022-04-01 LAB — URINE CULTURE: Culture: 10000 — AB

## 2022-04-01 LAB — CULTURE, BLOOD (ROUTINE X 2): Special Requests: ADEQUATE

## 2022-04-01 LAB — BODY FLUID CULTURE W GRAM STAIN

## 2022-04-01 NOTE — Progress Notes (Addendum)
Nutrition Brief Note  Chart reviewed. Discussed pt with RN and during ICU rounds. Pt transitioning to comfort care.  No nutrition interventions planned at this time.  Please consult as needed.    Gustavus Bryant, MS, RD, LDN Inpatient Clinical Dietitian Please see [facility] for contact information.

## 2022-04-01 NOTE — Progress Notes (Signed)
NAME:  Jared Bass, MRN:  [id number], DOB:  [birth year], LOS: 1 ADMISSION DATE:  03/26/2022, CONSULTATION DATE:  9/28 REFERRING MD:  Bebe Shaggy, CHIEF COMPLAINT:    History of Present Illness:  This is a 51 year old male patient with significant history of EtOH related cirrhosis with associated portal hypertension, esophageal varices, anasarca, chronic hyponatremia, and recurrent ascites requiring frequent paracentesis (last paracentesis approximately 2 weeks prior to presentation), also history of recurrent SBP.  Initially presented to the emergency room on 9/27 with a chief complaint of abdominal distention And discomfort he felt he needed to have a paracentesis at that time.  There is a significant weight so he decided to leave.  Then early in the morning on 9/28 had worsening abdominal discomfort, reporting it as pain all over.  And worsening associated shortness of breath, significant weakness, and chills.  He denied chest pain, states his weakness is so bad he cannot walk and nursing reports he was barely able to pivot into the bed with assistance.  Initial systolic blood pressure was in the 70s, he was tachycardic with heart rate in the 100s..  Lab evaluation: White blood cell count 1.7 down from 6.7, platelets 79, sodium 128, baseline around 123-125.  Glucose 39, creatinine 1.98 up from 1.22. Blood cultures were sent, paracentesis was obtained in emergency room, fluid was sent for culture and evaluation Ceftriaxone started He received 4 L of crystalloid and remained hypotensive and therefore critical care asked to admit.  Pertinent  Medical History  EtOH related cirrhosis (MELD score 29) with ascites and esophageal varices also history of hyponatremia.  Also history of EtOH related hepatitis.  And recurrent spontaneous bacterial peritonitis he stopped drinking in 2021 requires frequent paracentesis.  Followed at [facility], currently on transplant list History of pulmonary emboli History of atrial  fibrillation, hypertension, cellulitis Anasarca Chronic thrombocytopenia Significant Hospital Events: Including procedures, antibiotic start and stop dates in addition to other pertinent events   9/28: Admitted with chief complaint of new onset abdominal pain, shortness of breath, chills.  Admitted with working diagnosis of spontaneous bacterial peritonitis, paracentesis completed in emergency room, culture sent, empiric ceftriaxone initiated.  CT imaging: Advanced changes of cirrhosis with portal venous hypertension esophageal varices and large volume abdominal/pelvic ascites.  Small scattered low-attenuation liver lesions negative for pulmonary emboli but study limited.  There was a moderate size left pleural effusion with associated atelectasis moderate overt pulmonary edema diffuse anasarca 9/29 cultures growing ESBL Klebsiella, rapidly worsening overnight, maximized on three pressors, family contacted   Interim History / Subjective:  Rapidly worsening since yesterday Intubated, severe shock with renal failure and high pressor requirement, acidotic despite bicarb Family contacted to come in overnight  Objective   Blood pressure (!) 88/69, pulse 85, temperature (!) 95.9 F (35.5 C), resp. rate (!) 32, weight 97.5 kg, SpO2 (!) 89 %.    Vent Mode: PRVC FiO2 (%):  [90 %-100 %] 100 % Set Rate:  [18 bmp-32 bmp] 32 bmp Vt Set:  [410 mL-550 mL] 410 mL PEEP:  [5 cmH20-14 cmH20] 12 cmH20 Plateau Pressure:  [26 cmH20-29 cmH20] 28 cmH20   Intake/Output Summary (Last 24 hours) at 2022/04/22 0800 Last data filed at Apr 22, 2022 0600 Gross per 24 hour  Intake 8454.13 ml  Output 725 ml  Net 7729.13 ml   Filed Weights   03/21/2022 0502  Weight: 97.5 kg    General:  critically ill M, intubated and sedated HEENT: MM pink/moist, sclera icteric Neuro: not on sedation, eyes open but  not responsive CV: s1s2 rrr, no m/r/g PULM:  mechanically ventilated with decreased air entry in the bilateral  bases GI: soft, severely distended with absent BS Extremities: cool, mottled, massive anasarca and scrotal edema  Skin: no rashes or lesions   Resolved Hospital Problem list     Assessment & Plan:  Principal Problem:   SBP (spontaneous bacterial peritonitis) (HCC) Active Problems:   Generalized anxiety disorder   Acute respiratory failure with hypoxia (HCC)   AKI (acute kidney injury) (HCC)   Opioid use disorder, moderate, dependence (HCC)   GERD (gastroesophageal reflux disease)   Secondary esophageal varices without bleeding (HCC)   Portal hypertensive gastropathy (HCC)   Thrombocytopenia (HCC)   Coagulopathy (HCC)   Unspecified atrial fibrillation (HCC)   Alcoholic cirrhosis of liver with ascites (HCC)   Generalized abdominal pain   Hyponatremia   Severe sepsis with septic shock (HCC)   Hypoglycemia   Lactic acidosis   Severe protein-calorie malnutrition (HCC)   Anasarca   Dyspnea   Pleural effusion on left  Septic shock, presumptively secondary to spontaneous bacterial peritonitis with ESBL Klebsiella bacteremia  On Vanc and Meropenem  Plan -unfortunately in refractory shock despite broad spectrum abx, volume, bicarb and blood transfusion -baseline liver failure now with kidney failure and encephalopathy, not a dialysis candidate -he is on vaso, Levophed and Epi, unfortunately expect that pt will pass away soon, he is DNR -family coming in, will offer comfort measures  History of alcoholic cirrhosis, with portal hypertension, esophageal varices, ascites requiring frequent paracentesis, and anasarca stop drinking back in 2021 currently on [facility] list at [facility].   Plan -Treat sepsis -received Albumin Holding IV Lasix, carvedilol, and spironolactone given septic shock  Lactic acidosis in the setting of septic shock -slow to clear given liver failure, give additional AMP bicarb, supportive care until family can arrive   AKI secondary to septic  shock Plan -not a dialysis candidate, minimal UOP  Chronic hyponatremia in the setting of cirrhosis.  Urinalysis sent.  He is asymptomatic. -stable  Acute Hypoxic Respiratory Failure, likely multifactorial primarily secondary to large volume abdominal ascites, but also has left pleural effusion Plan -Maintain full vent support with SAT/SBT as tolerated -titrate Vent setting to maintain SpO2 greater than or equal to 90%. -HOB elevated 30 degrees. -Plateau pressures less than 30 cm H20.  -Follow chest x-ray, ABG prn.   -Bronchial hygiene and RT/bronchodilator protocol.  Chronic thrombocytopenia in the setting of cirrhosis Plan -platelets 35  History of GERD Plan PPI  Opioid dependence Plan -prn Fentanyl  Severe protein calorie malnutrition POA   Best Practice (right click and "Reselect all SmartList Selections" daily)   Diet/type: NPO w/ oral meds DVT prophylaxis: SCD GI prophylaxis: PPI Lines: Central line, Arterial Line, and yes and it is still needed Foley:  Yes, and it is still needed Code Status:  DNR Last date of multidisciplinary goals of care discussion [sister was updated via phone overnight that pt is dying, will attempt to call again if family does not arrive to the hospital soon]  Labs   CBC: Recent Labs  Lab 03/28/22 0821 31-Mar-2022 0515 03-31-22 1517 March 31, 2022 1729 03/31/2022 2353 03/11/2022 0312 03/25/2022 0553  WBC 6.7 1.7*  --   --   --   --  2.5*  NEUTROABS 4.3 1.2*  --   --   --   --   --   HGB 9.5* 9.2* 7.8* 7.5* 6.5* 7.1* 8.5*  HCT 26.3* 26.0* 23.0* 22.0* 19.0*  21.0* 25.6*  MCV 106.0* 107.4*  --   --   --   --  110.8*  PLT 86* 79*  --   --   --   --  35*     Basic Metabolic Panel: Recent Labs  Lab 03/28/22 0821 April 09, 2022 0515 2022/04/09 1517 April 09, 2022 1646 04/09/2022 1729 04/09/22 2353 03/26/2022 0312 03/19/2022 0553  NA 123* 120*   < > 123* 123* 123* 123* 124*  K 4.1 4.4   < > 4.2 4.3 4.3 4.3 4.5  CL 89* 90*  --  91*  --   --   --  94*  CO2  26 18*  --  17*  --   --   --  11*  GLUCOSE 120* 39*  --  77  --   --   --  102*  BUN 26* 33*  --  35*  --   --   --  37*  CREATININE 1.22 1.98*  --  1.74*  --   --   --  2.29*  CALCIUM 8.1* 7.9*  --  7.7*  --   --   --  7.3*  MG 2.0 1.7  --   --   --   --   --  1.8  PHOS 4.0  --   --   --   --   --   --  8.9*   < > = values in this interval not displayed.    GFR: Estimated Creatinine Clearance: 42.2 mL/min (A) (by C-G formula based on SCr of 2.29 mg/dL (H)). Recent Labs  Lab 03/28/22 0821 Apr 09, 2022 0515 04/09/22 0612 April 09, 2022 0737 04/09/22 0739 04/09/22 1014 2022/04/09 1230 03/14/2022 0553  PROCALCITON  --   --   --  8.16  --   --   --  36.86  WBC 6.7 1.7*  --   --   --   --   --  2.5*  LATICACIDVEN  --   --  4.0*  --  4.0* 4.2* 4.0*  --      Liver Function Tests: Recent Labs  Lab 03/28/22 0821 09-Apr-2022 0515  AST 58* 61*  ALT 36 30  ALKPHOS 135* 109  BILITOT 10.9* 10.6*  PROT 6.0* 5.1*  ALBUMIN 1.8* 1.6*    Recent Labs  Lab 03/28/22 0821  LIPASE 59*    Recent Labs  Lab 03/28/22 0812 04-09-2022 0515  AMMONIA 17 29     ABG    Component Value Date/Time   PHART 7.098 (LL) 03/04/2022 0312   PCO2ART 35.7 03/16/2022 0312   PO2ART 73 (L) 03/24/2022 0312   HCO3 11.3 (L) 03/31/2022 0312   TCO2 12 (L) 03/22/2022 0312   ACIDBASEDEF 17.0 (H) 03/09/2022 0312   O2SAT 90 03/23/2022 0312     Coagulation Profile: Recent Labs  Lab 03/28/22 0821 09-Apr-2022 0515  INR 2.1* 2.2*     Cardiac Enzymes: No results for input(s): "CKTOTAL", "CKMB", "CKMBINDEX", "TROPONINI" in the last 168 hours.  HbA1C: Hgb A1c MFr Bld  Date/Time Value Ref Range Status  12/13/2015 02:34 AM 5.4 4.8 - 5.6 % Final    Comment:    (NOTE)         Pre-diabetes: 5.7 - 6.4         Diabetes: >6.4         Glycemic control for adults with diabetes: <7.0     CBG: Recent Labs  Lab 04/09/2022 2351 03/20/2022 0205 03/02/2022 0341 03/28/2022 0551 03/18/2022 0753  GLUCAP  118* 75 96 99 88      Review of Systems:   Abdominal pain, fever, chills.  Profound weakness.  All as mentioned above in HPI  Past Medical History:  He,  has a past medical history of Acute renal failure ([place]), Acute respiratory failure ([place]), Acute respiratory failure with hypoxia ([place]), Acute saddle pulmonary embolism with acute cor pulmonale ([place]), Cataract, Chronic neck pain, Cirrhosis ([place]), Closed fracture of nasal bone, DDD (degenerative disc disease), cervical (05/04/2015), Faintness, Hemoptysis, History of atrial fibrillation, History of head injury (05/04/2015), History of tachycardia (05/04/2015), Hypertension, Hypotension, Liver cirrhosis ([place]), Pulmonary emboli ([place]), Pulmonary embolism ([place]), Right eye injury, Saddle pulmonary embolus ([place]) (12/13/2015), Substance abuse ([place]), Syncope and collapse, and Tachycardia (12/20/2015).   Surgical History:   Past Surgical History:  Procedure Laterality Date   ESOPHAGOGASTRODUODENOSCOPY (EGD) WITH PROPOFOL N/A 01/29/2020   Procedure: ESOPHAGOGASTRODUODENOSCOPY (EGD) WITH PROPOFOL;  Surgeon: Lavena Bullion, DO;  Location: [facility];  Service: Gastroenterology;  Laterality: N/A;   IR PARACENTESIS  01/29/2020   IR PARACENTESIS  12/27/2020   neck fusion     c5-7   TONSILLECTOMY     UPPER GASTROINTESTINAL ENDOSCOPY       Social History:   reports that he has never smoked. He has never used smokeless tobacco. He reports that he does not drink alcohol and does not use drugs.   Family History:  His family history includes COPD in his father and mother; Hypertension in his mother; Mesothelioma in his father; Other in his sister; Peripheral vascular disease in his brother. There is no history of Colon cancer, Stomach cancer, Esophageal cancer, or Rectal cancer.   Allergies Allergies  Allergen Reactions   Tramadol Anaphylaxis and Rash   Other Swelling and Other (See Comments)    Rembrandt Whitening strips - "fat lips"     Home Medications  Prior  to Admission medications   Medication Sig Start Date End Date Taking? Authorizing Provider  albuterol (VENTOLIN HFA) 108 (90 Base) MCG/ACT inhaler INHALE 2 PUFFS INTO THE LUNGS EVERY 6 HOURS AS NEEDED (COUGH). Patient taking differently: Inhale 2 puffs into the lungs every 6 (six) hours as needed for wheezing or shortness of breath (or coughing). 05/04/19  Yes Sid Falcon, MD  amphetamine-dextroamphetamine (ADDERALL) 20 MG tablet Take 20 mg by mouth 2 (two) times daily. 12/15/21  Yes [provider]  carvedilol (COREG) 6.25 MG tablet Take 6.25 mg by mouth 2 (two) times daily. 03/22/22  Yes [provider]  ciprofloxacin (CIPRO) 500 MG tablet Take 1 tablet (500 mg total) by mouth daily. 03/07/22  Yes Cherene Altes, MD  furosemide (LASIX) 40 MG tablet Take 1 tablet (40 mg total) by mouth 2 (two) times daily. Patient taking differently: Take 20 mg by mouth daily. 03/06/22  Yes Cherene Altes, MD  polyethylene glycol (MIRALAX / GLYCOLAX) packet Take 17 g by mouth daily. Patient taking differently: Take 17 g by mouth daily as needed for mild constipation (mix and drink). 07/30/17  Yes Lorella Nimrod, MD  Multiple Vitamins-Minerals (MULTI + OMEGA-3 ADULT GUMMIES PO) Take 1 tablet by mouth daily. Patient not taking: Reported on 04-15-2022    [provider]  spironolactone (ALDACTONE) 50 MG tablet Take 1 tablet (50 mg total) by mouth daily. Patient not taking: Reported on 2022/04/15 01/01/21   Elmarie Shiley, MD     Critical care time: 35 minutes    CRITICAL CARE Performed by: Otilio Carpen Jakiyah Stepney   Total critical care time:  35 minutes  Critical care time was exclusive of separately billable procedures and treating other patients.  Critical care was necessary to treat or prevent imminent or life-threatening deterioration.  Critical care was time spent personally by me on the following activities: development of treatment plan with patient and/or surrogate as well as  nursing, discussions with consultants, evaluation of patient's response to treatment, examination of patient, obtaining history from patient or surrogate, ordering and performing treatments and interventions, ordering and review of laboratory studies, ordering and review of radiographic studies, pulse oximetry and re-evaluation of patient's condition.  Darcella Gasman Heyli Min, PA-C [facility] See Amion for pager If no response to pager , please call [phone] until 7pm After 7:00 pm call Elink  [phone]

## 2022-04-01 NOTE — Progress Notes (Signed)
I called and updated his sister who is the first contact in the chart about his current status and how he is very hypotensive despite being on high dose pressors.  I let her know about the cultures being positive and him being on antibiotics, but that the infection is very severe and he may not survive the night. She tells me his daughters are on their way and hoping to make it in to see him first thing in the morning.

## 2022-04-01 NOTE — Progress Notes (Signed)
Patient DNR status. Patient's rhythm went asystole and 2 RN Elwyn Reach, RN and Calton Golds, RN) auscultated no heart sounds at 1339. Patient still on ventilator. PA, Gleason notified and orders placed to remove ventilator and ETT. Ventilator turned off and no lung sounds auscultated by this RN or Manda, RN at 1356. Patient extubated at that time. Family in room with no needs or questions at this time.

## 2022-04-01 NOTE — Progress Notes (Signed)
This chaplain responded to the unit referral for EOL spiritual care. The chaplain was updated by the Pt. RN-Ryan before the visit. The Pt. two daughters-Christine and Nira Conn are at the bedside.  The chaplain understands the Pt. freely shared God's love with others.  The daughters connected the Pt. spiritual witness with others to their gift of prayer and comfort for the Pt. today.  The chaplain left the daughters in the quiet space of leaning on each other and waiting the arrival of a cousin.  This chaplain is available for F/U spiritual care as needed.  Chaplain Sallyanne Kuster [phone]

## 2022-04-01 NOTE — Progress Notes (Addendum)
PCCM Interval Progress Note  Contacted by E-Link with a request for bedside ground team evaluation. Reviewed patient's medical chart/history and HPI.  On my arrival, patient acutely ill-appearing with exam as below:  Vitals notable for hypotension with MAPs 60s on A-line despite NE 31mcg, Epi 8.87mcg, vaso 0.04. SpO2 pleth poor (likely in the setting of poor perfusion), readings variable 78-91%. Vent settings with PEEP 14, FiO2 100%. For this reason, ABG obtained as follows: pH 7.139/pCO2 40.1/pO2 70/Bicarb 13.6. Hgb 6.5 on i-STAT.  Bicarb 2 amps administered with grossly unchanged clinical status. Cx positive for ESBL klebsiella. Antibiotics broadened to meropenem/vanc. Will transfuse 1U PRBCs for Hgb 6.5 and reassess.  Additionally - Dr. Duwayne Heck decreased patient's PEEP from 14 to 12 with good maintenance of O2 sats and improved vent mechanics. BP with slight increase as well.  Lestine Mount, PA-C [facility] 08-Apr-2022 1:02 AM  Please see [email or web address] for pager details.  From 7A-7P if no response, please call [phone] After hours, please call ELink [phone]

## 2022-04-01 DEATH — deceased

## 2022-04-02 LAB — PATHOLOGIST SMEAR REVIEW

## 2022-04-17 DIAGNOSIS — R7881 Bacteremia: Secondary | ICD-10-CM | POA: Diagnosis present

## 2022-04-30 ENCOUNTER — Encounter: Payer: Medicare Other | Admitting: Gastroenterology

## 2022-05-02 NOTE — Death Summary Note (Signed)
DEATH SUMMARY   Patient Details  Name: Jared Bass MRN: [id number] DOB: 1971/04/25  Admission/Discharge Information   Admit Date:  18-Apr-2022  Date of Death: Date of Death: 2022-04-19  Time of Death: Time of Death: 4098 (No heart sounds and asystole at 1339 but patient still on ventilator. Patient without ventilator and extubated with no lung sounds at 1356)  Length of Stay: 1  Referring Physician: Jonetta Speak., MD   Reason(s) for Hospitalization  Abdominal distention, abdominal pain and weakness.  Diagnoses  Preliminary cause of death:  Secondary Diagnoses (including complications and co-morbidities):  Principal Problem:   Bacteremia due to Klebsiella pneumoniae Active Problems:   Generalized anxiety disorder   Acute respiratory failure with hypoxia (HCC)   AKI (acute kidney injury) (HCC)   Opioid use disorder, moderate, dependence (HCC)   GERD (gastroesophageal reflux disease)   Secondary esophageal varices without bleeding (HCC)   Portal hypertensive gastropathy (HCC)   SBP (spontaneous bacterial peritonitis) (HCC)   Thrombocytopenia (HCC)   Coagulopathy (HCC)   Unspecified atrial fibrillation (HCC)   Alcoholic cirrhosis of liver with ascites (HCC)   Generalized abdominal pain   Hyponatremia   Severe sepsis with septic shock (HCC)   Hypoglycemia   Lactic acidosis   Severe protein-calorie malnutrition (HCC)   Anasarca   Dyspnea   Pleural effusion on left   Brief Hospital Course (including significant findings, care, treatment, and services provided and events leading to death)  Jared Bass is a 51 y.o. year old male with a history of alcohol related cirrhosis on the transplant list at [facility] with associated portal hypertension, esophageal varices, chronic hyponatremia and volume overload, thrombocytopenia.  He required multiple paracenteses at baseline.  Also with a history of pulmonary embolism.  He presented to [facility] 04/19/23 with new onset progressive abdominal  pain and distention, chills, dyspnea.  He was admitted for suspected SBP.  He underwent paracentesis and was started on ceftriaxone.  He underwent volume resuscitation.  Developed progressive hypotension and evidence for septic shock.  Peritoneal fluid and blood cultures were positive for Klebsiella pneumoniae, ESBL secreting.  Antibiotics were broadened and he was treated aggressively for septic shock.  Unfortunately continued to have progressive decline with encephalopathy, acute respiratory failure requiring intubation and mechanical ventilation.  He also had progressive acute renal failure with oliguria and an associated lactic acidosis.  Despite all full support he failed to stabilize or respond to therapy.  Given this and his poor prognosis for recovery decision was made with family to transition him to comfort care.  This was done on on April 20, 2023 and he expired at 1339.     Pertinent Labs and Studies  Significant Diagnostic Studies DG Chest Port 1 View  Result Date: 2022-04-19 CLINICAL DATA:  51 year old male with shortness of breath. EXAM: PORTABLE CHEST 1 VIEW COMPARISON:  Portable chest April 18, 2022 and earlier. FINDINGS: Portable AP semi upright view at 0349 hours. Stable lines and tubes. Stable lung volumes and mediastinal contours. Veiling opacity at both lung bases and behind the heart. Pulmonary vascularity appears decreased since yesterday. No pneumothorax. No definite air bronchograms. Stable visualized osseous structures. Paucity of bowel gas in the upper abdomen. IMPRESSION: 1. Stable lines and tubes. 2. Continued bilateral pleural effusions with lower lobe collapse or consolidation. Regression of pulmonary vascular congestion/edema since yesterday. Electronically Signed   By: Genevie Ann M.D.   On: 04-19-22 04:11   DG CHEST PORT 1 VIEW  Result Date: April 18, 2022 CLINICAL DATA:  Endotracheal tube  placement EXAM: PORTABLE CHEST 1 VIEW COMPARISON:  1233 earlier today FINDINGS: 2:23 p.m. Right  internal jugular line tip at superior caval/atrial junction. Endotracheal tube terminates 2.9 cm above carina. Nasogastric tube terminates at the body of the stomach with the side port below the GE junction. Numerous leads and wires project over the chest. Cardiomegaly accentuated by AP portable technique. Small bilateral pleural effusions. No pneumothorax. Interstitial edema is mild, decreased. Left-greater-than-right base airspace disease is also slightly improved. IMPRESSION: Placement of endotracheal and nasogastric tubes, appropriately positioned. Improvement in congestive heart failure and bibasilar atelectasis. Persistent small bilateral pleural effusions. Electronically Signed   By: Abigail Miyamoto M.D.   On: 03/14/2022 14:39   DG Chest Port 1 View  Result Date: 03/12/2022 CLINICAL DATA:  Central line placement. EXAM: PORTABLE CHEST 1 VIEW COMPARISON:  Same day. FINDINGS: Interval placement of right internal jugular catheter with distal tip in expected position of cavoatrial junction. No definite pneumothorax is noted. IMPRESSION: Interval placement of right internal jugular catheter with distal tip in expected position of cavoatrial junction. Electronically Signed   By: Marijo Conception M.D.   On: 03/07/2022 12:41   CT Angio Chest PE W and/or Wo Contrast  Result Date: 03/31/2022 CLINICAL DATA:  Shortness of breath and abdominal pain. EXAM: CT ANGIOGRAPHY CHEST CT ABDOMEN AND PELVIS WITH CONTRAST TECHNIQUE: Multidetector CT imaging of the chest was performed using the standard protocol during bolus administration of intravenous contrast. Multiplanar CT image reconstructions and MIPs were obtained to evaluate the vascular anatomy. Multidetector CT imaging of the abdomen and pelvis was performed using the standard protocol during bolus administration of intravenous contrast. RADIATION DOSE REDUCTION: This exam was performed according to the departmental dose-optimization program which includes automated  exposure control, adjustment of the mA and/or kV according to patient size and/or use of iterative reconstruction technique. CONTRAST:  70mL OMNIPAQUE IOHEXOL 350 MG/ML SOLN COMPARISON:  Chest CT from 2017 FINDINGS: CTA CHEST FINDINGS Cardiovascular: The heart is normal in size. No pericardial effusion. The aorta is normal in caliber. No dissection. Poorly opacified pulmonary arteries but no large central filling defects to suggest central pulmonary emboli. Mediastinum/Nodes: Borderline mediastinal and hilar lymph nodes. Moderate mid and significant distal esophageal wall thickening. Findings likely due to massive esophageal varices. Lungs/Pleura: Moderate-sized left pleural effusion with overlying atelectasis. Right basilar atelectasis is also noted. Moderate central vascular congestion without overt pulmonary edema. Musculoskeletal: Significant bilateral gynecomastia is new since the prior study and likely due to the patient's liver disease. The bony thorax is intact. Review of the MIP images confirms the above findings. CT ABDOMEN and PELVIS FINDINGS Hepatobiliary: Advanced changes of cirrhosis. Associated portal venous hypertension and massive portal venous collaterals. Small scattered low-attenuation lesions are indeterminate. I do not see any definite arterial phase enhancement on the PE study. Biliary dilatation noted in the left hepatic lobe. The gallbladder is mildly distended. No gallstones. No common bile duct dilatation. Pancreas: No mass, inflammation or ductal dilatation. Spleen: Within normal limits in size. Perisplenic collaterals and splenorenal shunt. Adrenals/Urinary Tract: Adrenal glands and kidneys are unremarkable. The bladder is grossly normal. Stomach/Bowel: The stomach, duodenum, small bowel and colon grossly normal. No obstructive findings. Vascular/Lymphatic: The aorta and branch vessels are patent. Upper abdominal lymph nodes typical with cirrhosis. No retroperitoneal mass or adenopathy.  Reproductive: The prostate gland and seminal vesicles grossly. Other: Massive abdominal/pelvic ascites. Severe diffuse body wall edema consistent with anasarca. There is also massive penile and scrotal edema. Musculoskeletal: No significant bony findings.  Review of the MIP images confirms the above findings. IMPRESSION: 1. Advanced changes of cirrhosis with portal venous hypertension, portal venous collaterals, esophageal varices and large volume abdominal/pelvic ascites. 2. Small scattered low-attenuation liver lesions are indeterminate. Recommend MRI abdomen with and without contrast for further evaluation. 3. No large central pulmonary emboli but study is limited due to poor opacification the pulmonary arteries. 4. Moderate-sized left pleural effusion with overlying atelectasis. 5. Moderate central vascular congestion without overt pulmonary edema. 6. Significant diffuse body wall edema consistent with anasarca. There is also massive penile and scrotal edema. Electronically Signed   By: Marijo Sanes M.D.   On: 03/18/2022 07:33   CT ABDOMEN PELVIS W CONTRAST  Result Date: 03/19/2022 CLINICAL DATA:  Shortness of breath and abdominal pain. EXAM: CT ANGIOGRAPHY CHEST CT ABDOMEN AND PELVIS WITH CONTRAST TECHNIQUE: Multidetector CT imaging of the chest was performed using the standard protocol during bolus administration of intravenous contrast. Multiplanar CT image reconstructions and MIPs were obtained to evaluate the vascular anatomy. Multidetector CT imaging of the abdomen and pelvis was performed using the standard protocol during bolus administration of intravenous contrast. RADIATION DOSE REDUCTION: This exam was performed according to the departmental dose-optimization program which includes automated exposure control, adjustment of the mA and/or kV according to patient size and/or use of iterative reconstruction technique. CONTRAST:  41mL OMNIPAQUE IOHEXOL 350 MG/ML SOLN COMPARISON:  Chest CT from 2017  FINDINGS: CTA CHEST FINDINGS Cardiovascular: The heart is normal in size. No pericardial effusion. The aorta is normal in caliber. No dissection. Poorly opacified pulmonary arteries but no large central filling defects to suggest central pulmonary emboli. Mediastinum/Nodes: Borderline mediastinal and hilar lymph nodes. Moderate mid and significant distal esophageal wall thickening. Findings likely due to massive esophageal varices. Lungs/Pleura: Moderate-sized left pleural effusion with overlying atelectasis. Right basilar atelectasis is also noted. Moderate central vascular congestion without overt pulmonary edema. Musculoskeletal: Significant bilateral gynecomastia is new since the prior study and likely due to the patient's liver disease. The bony thorax is intact. Review of the MIP images confirms the above findings. CT ABDOMEN and PELVIS FINDINGS Hepatobiliary: Advanced changes of cirrhosis. Associated portal venous hypertension and massive portal venous collaterals. Small scattered low-attenuation lesions are indeterminate. I do not see any definite arterial phase enhancement on the PE study. Biliary dilatation noted in the left hepatic lobe. The gallbladder is mildly distended. No gallstones. No common bile duct dilatation. Pancreas: No mass, inflammation or ductal dilatation. Spleen: Within normal limits in size. Perisplenic collaterals and splenorenal shunt. Adrenals/Urinary Tract: Adrenal glands and kidneys are unremarkable. The bladder is grossly normal. Stomach/Bowel: The stomach, duodenum, small bowel and colon grossly normal. No obstructive findings. Vascular/Lymphatic: The aorta and branch vessels are patent. Upper abdominal lymph nodes typical with cirrhosis. No retroperitoneal mass or adenopathy. Reproductive: The prostate gland and seminal vesicles grossly. Other: Massive abdominal/pelvic ascites. Severe diffuse body wall edema consistent with anasarca. There is also massive penile and scrotal  edema. Musculoskeletal: No significant bony findings. Review of the MIP images confirms the above findings. IMPRESSION: 1. Advanced changes of cirrhosis with portal venous hypertension, portal venous collaterals, esophageal varices and large volume abdominal/pelvic ascites. 2. Small scattered low-attenuation liver lesions are indeterminate. Recommend MRI abdomen with and without contrast for further evaluation. 3. No large central pulmonary emboli but study is limited due to poor opacification the pulmonary arteries. 4. Moderate-sized left pleural effusion with overlying atelectasis. 5. Moderate central vascular congestion without overt pulmonary edema. 6. Significant diffuse body wall edema consistent  with anasarca. There is also massive penile and scrotal edema. Electronically Signed   By: Marijo Sanes M.D.   On: 03/15/2022 07:33   DG Chest Portable 1 View  Result Date: 03/26/2022 CLINICAL DATA:  51 year old male with shortness of breath and weakness. EXAM: PORTABLE CHEST 1 VIEW COMPARISON:  03/02/2022 and earlier. FINDINGS: Portable AP semi upright view at 0515 hours. Continued low lung volumes. Mediastinal contours are stable, heart size at the upper limits of normal. Indistinct left lung base opacity, with small pleural effusions suspected previously. No pneumothorax or consolidation. Pulmonary vascularity is stable with no acute edema. No definite right pleural effusion. Paucity of bowel gas. Prior cervical ACDF. IMPRESSION: Continued low lung volumes with indistinct left lung base opacity which may reflect ongoing or increased left pleural effusion since earlier this month. No pulmonary edema. Electronically Signed   By: Genevie Ann M.D.   On: 03/12/2022 05:40     Baltazar Apo, MD, PhD 04/17/2022, 1:59 PM [facility] [phone] or if no answer before 7:00PM call [phone] For any issues after 7:00PM please call eLink [phone]
